# Patient Record
Sex: Female | Born: 1945 | Race: White | Hispanic: No | Marital: Married | Smoking: Never smoker
Health system: Southern US, Community
[De-identification: ages and names within clinical notes are randomized; demographics above are authoritative.]

## PROBLEM LIST (undated history)

## (undated) DIAGNOSIS — J029 Acute pharyngitis, unspecified: Secondary | ICD-10-CM

## (undated) DIAGNOSIS — I10 Essential (primary) hypertension: Secondary | ICD-10-CM

## (undated) DIAGNOSIS — M949 Disorder of cartilage, unspecified: Secondary | ICD-10-CM

## (undated) DIAGNOSIS — I059 Rheumatic mitral valve disease, unspecified: Secondary | ICD-10-CM

## (undated) DIAGNOSIS — E663 Overweight: Secondary | ICD-10-CM

## (undated) DIAGNOSIS — I253 Aneurysm of heart: Secondary | ICD-10-CM

## (undated) DIAGNOSIS — D649 Anemia, unspecified: Secondary | ICD-10-CM

## (undated) DIAGNOSIS — M899 Disorder of bone, unspecified: Secondary | ICD-10-CM

## (undated) DIAGNOSIS — Z5189 Encounter for other specified aftercare: Secondary | ICD-10-CM

## (undated) DIAGNOSIS — E119 Type 2 diabetes mellitus without complications: Secondary | ICD-10-CM

## (undated) DIAGNOSIS — R87619 Unspecified abnormal cytological findings in specimens from cervix uteri: Secondary | ICD-10-CM

## (undated) DIAGNOSIS — N189 Chronic kidney disease, unspecified: Secondary | ICD-10-CM

## (undated) DIAGNOSIS — B009 Herpesviral infection, unspecified: Secondary | ICD-10-CM

## (undated) DIAGNOSIS — E039 Hypothyroidism, unspecified: Secondary | ICD-10-CM

## (undated) DIAGNOSIS — T7840XA Allergy, unspecified, initial encounter: Secondary | ICD-10-CM

## (undated) HISTORY — DX: Disorder of cartilage, unspecified: M94.9

## (undated) HISTORY — PX: CHOLECYSTECTOMY: SHX55

## (undated) HISTORY — PX: WISDOM TOOTH EXTRACTION: SHX21

## (undated) HISTORY — DX: Aneurysm of heart: I25.3

## (undated) HISTORY — DX: Overweight: E66.3

## (undated) HISTORY — PX: OTHER SURGICAL HISTORY: SHX169

## (undated) HISTORY — DX: Chronic kidney disease, unspecified: N18.9

## (undated) HISTORY — DX: Unspecified abnormal cytological findings in specimens from cervix uteri: R87.619

## (undated) HISTORY — DX: Encounter for other specified aftercare: Z51.89

## (undated) HISTORY — DX: Type 2 diabetes mellitus without complications: E11.9

## (undated) HISTORY — DX: Anemia, unspecified: D64.9

## (undated) HISTORY — DX: Hypothyroidism, unspecified: E03.9

## (undated) HISTORY — DX: Herpesviral infection, unspecified: B00.9

## (undated) HISTORY — DX: Allergy, unspecified, initial encounter: T78.40XA

## (undated) HISTORY — DX: Disorder of bone, unspecified: M89.9

## (undated) HISTORY — DX: Acute pharyngitis, unspecified: J02.9

## (undated) HISTORY — DX: Rheumatic mitral valve disease, unspecified: I05.9

## (undated) HISTORY — DX: Essential (primary) hypertension: I10

## (undated) HISTORY — PX: ABDOMINAL HYSTERECTOMY: SHX81

---

## 2000-10-21 ENCOUNTER — Other Ambulatory Visit: Admission: RE | Admit: 2000-10-21 | Discharge: 2000-10-21 | Payer: Self-pay | Admitting: *Deleted

## 2001-10-27 ENCOUNTER — Encounter: Admission: RE | Admit: 2001-10-27 | Discharge: 2001-10-27 | Payer: Self-pay | Admitting: Internal Medicine

## 2001-10-27 ENCOUNTER — Encounter: Payer: Self-pay | Admitting: Internal Medicine

## 2004-08-21 ENCOUNTER — Encounter: Admission: RE | Admit: 2004-08-21 | Discharge: 2004-08-21 | Payer: Self-pay | Admitting: Internal Medicine

## 2005-02-19 ENCOUNTER — Ambulatory Visit: Payer: Self-pay | Admitting: Family Medicine

## 2005-08-27 ENCOUNTER — Ambulatory Visit: Payer: Self-pay | Admitting: Family Medicine

## 2005-09-16 ENCOUNTER — Ambulatory Visit: Payer: Self-pay

## 2005-09-23 ENCOUNTER — Ambulatory Visit: Payer: Self-pay | Admitting: Family Medicine

## 2005-10-09 ENCOUNTER — Ambulatory Visit: Payer: Self-pay | Admitting: Cardiovascular Disease

## 2005-11-24 ENCOUNTER — Ambulatory Visit: Payer: Self-pay | Admitting: Family Medicine

## 2005-12-05 ENCOUNTER — Ambulatory Visit: Payer: Self-pay | Admitting: Family Medicine

## 2005-12-25 ENCOUNTER — Ambulatory Visit: Payer: Self-pay | Admitting: Cardiovascular Disease

## 2005-12-25 ENCOUNTER — Ambulatory Visit (HOSPITAL_COMMUNITY): Admission: RE | Admit: 2005-12-25 | Discharge: 2005-12-25 | Payer: Self-pay | Admitting: Cardiovascular Disease

## 2006-02-16 ENCOUNTER — Encounter: Admission: RE | Admit: 2006-02-16 | Discharge: 2006-02-16 | Payer: Self-pay | Admitting: Family Medicine

## 2006-04-29 ENCOUNTER — Ambulatory Visit: Payer: Self-pay | Admitting: Cardiovascular Disease

## 2006-08-21 ENCOUNTER — Ambulatory Visit: Payer: Self-pay | Admitting: Cardiovascular Disease

## 2006-09-16 ENCOUNTER — Ambulatory Visit: Payer: Self-pay | Admitting: Family Medicine

## 2006-09-22 ENCOUNTER — Ambulatory Visit: Payer: Self-pay | Admitting: Family Medicine

## 2006-10-26 ENCOUNTER — Ambulatory Visit: Payer: Self-pay | Admitting: Internal Medicine

## 2006-11-09 ENCOUNTER — Ambulatory Visit: Payer: Self-pay | Admitting: Internal Medicine

## 2006-11-09 ENCOUNTER — Encounter: Payer: Self-pay | Admitting: Internal Medicine

## 2006-12-29 ENCOUNTER — Ambulatory Visit: Payer: Self-pay | Admitting: Family Medicine

## 2006-12-29 LAB — CONVERTED CEMR LAB
ALT: 36 units/L (ref 0–40)
AST: 32 units/L (ref 0–37)
Calcium: 8.9 mg/dL (ref 8.4–10.5)
Chloride: 101 meq/L (ref 96–112)
Creatinine, Ser: 0.8 mg/dL (ref 0.4–1.2)
Glomerular Filtration Rate, Af Am: 94 mL/min/{1.73_m2}
Glucose, Bld: 137 mg/dL — ABNORMAL HIGH (ref 70–99)
Potassium: 3.3 meq/L — ABNORMAL LOW (ref 3.5–5.1)
Sodium: 138 meq/L (ref 135–145)

## 2007-02-02 ENCOUNTER — Encounter: Payer: Self-pay | Admitting: Cardiology

## 2007-02-02 ENCOUNTER — Ambulatory Visit: Payer: Self-pay | Admitting: Cardiovascular Disease

## 2007-02-08 ENCOUNTER — Ambulatory Visit: Payer: Self-pay | Admitting: Family Medicine

## 2007-02-08 LAB — CONVERTED CEMR LAB
ALT: 29 units/L (ref 0–40)
Albumin: 4.1 g/dL (ref 3.5–5.2)
Alkaline Phosphatase: 40 units/L (ref 39–117)
CO2: 29 meq/L (ref 19–32)
Calcium: 9.3 mg/dL (ref 8.4–10.5)
Glucose, Bld: 129 mg/dL — ABNORMAL HIGH (ref 70–99)
Potassium: 3.8 meq/L (ref 3.5–5.1)
Total Bilirubin: 0.7 mg/dL (ref 0.3–1.2)
Total Protein: 7.1 g/dL (ref 6.0–8.3)

## 2007-02-09 ENCOUNTER — Encounter: Payer: Self-pay | Admitting: Family Medicine

## 2007-02-17 ENCOUNTER — Encounter: Admission: RE | Admit: 2007-02-17 | Discharge: 2007-02-17 | Payer: Self-pay | Admitting: Family Medicine

## 2007-02-23 ENCOUNTER — Ambulatory Visit: Payer: Self-pay | Admitting: Cardiovascular Disease

## 2007-02-23 LAB — CONVERTED CEMR LAB
CO2: 26 meq/L (ref 19–32)
Creatinine, Ser: 0.8 mg/dL (ref 0.4–1.2)
GFR calc Af Amer: 94 mL/min
Glucose, Bld: 120 mg/dL — ABNORMAL HIGH (ref 70–99)
Potassium: 3.8 meq/L (ref 3.5–5.1)
Sodium: 139 meq/L (ref 135–145)

## 2007-04-26 ENCOUNTER — Ambulatory Visit: Payer: Self-pay | Admitting: Cardiovascular Disease

## 2007-04-27 ENCOUNTER — Ambulatory Visit: Payer: Self-pay | Admitting: Cardiovascular Disease

## 2007-04-27 LAB — CONVERTED CEMR LAB
CO2: 30 meq/L (ref 19–32)
GFR calc Af Amer: 94 mL/min
GFR calc non Af Amer: 78 mL/min
Glucose, Bld: 113 mg/dL — ABNORMAL HIGH (ref 70–99)
Potassium: 3.5 meq/L (ref 3.5–5.1)
Sodium: 142 meq/L (ref 135–145)

## 2007-05-18 DIAGNOSIS — I059 Rheumatic mitral valve disease, unspecified: Secondary | ICD-10-CM | POA: Insufficient documentation

## 2007-05-18 DIAGNOSIS — M949 Disorder of cartilage, unspecified: Secondary | ICD-10-CM

## 2007-05-18 DIAGNOSIS — I10 Essential (primary) hypertension: Secondary | ICD-10-CM

## 2007-05-18 DIAGNOSIS — M899 Disorder of bone, unspecified: Secondary | ICD-10-CM | POA: Insufficient documentation

## 2007-06-03 ENCOUNTER — Ambulatory Visit: Payer: Self-pay | Admitting: Internal Medicine

## 2007-06-03 DIAGNOSIS — J029 Acute pharyngitis, unspecified: Secondary | ICD-10-CM

## 2007-06-14 ENCOUNTER — Ambulatory Visit: Payer: Self-pay | Admitting: Cardiovascular Disease

## 2007-10-20 ENCOUNTER — Ambulatory Visit: Payer: Self-pay | Admitting: Family Medicine

## 2007-10-21 ENCOUNTER — Telehealth (INDEPENDENT_AMBULATORY_CARE_PROVIDER_SITE_OTHER): Payer: Self-pay | Admitting: *Deleted

## 2007-10-28 ENCOUNTER — Telehealth (INDEPENDENT_AMBULATORY_CARE_PROVIDER_SITE_OTHER): Payer: Self-pay | Admitting: *Deleted

## 2007-12-08 ENCOUNTER — Ambulatory Visit: Payer: Self-pay | Admitting: Cardiovascular Disease

## 2008-02-07 ENCOUNTER — Ambulatory Visit: Payer: Self-pay | Admitting: Cardiovascular Disease

## 2008-02-07 ENCOUNTER — Ambulatory Visit: Payer: Self-pay

## 2008-02-07 ENCOUNTER — Encounter: Payer: Self-pay | Admitting: Cardiovascular Disease

## 2008-03-21 ENCOUNTER — Encounter: Admission: RE | Admit: 2008-03-21 | Discharge: 2008-03-21 | Payer: Self-pay | Admitting: Family Medicine

## 2008-03-24 ENCOUNTER — Encounter (INDEPENDENT_AMBULATORY_CARE_PROVIDER_SITE_OTHER): Payer: Self-pay | Admitting: *Deleted

## 2008-09-27 ENCOUNTER — Telehealth (INDEPENDENT_AMBULATORY_CARE_PROVIDER_SITE_OTHER): Payer: Self-pay | Admitting: *Deleted

## 2008-10-20 ENCOUNTER — Telehealth (INDEPENDENT_AMBULATORY_CARE_PROVIDER_SITE_OTHER): Payer: Self-pay | Admitting: *Deleted

## 2009-01-22 ENCOUNTER — Telehealth (INDEPENDENT_AMBULATORY_CARE_PROVIDER_SITE_OTHER): Payer: Self-pay | Admitting: *Deleted

## 2009-02-07 ENCOUNTER — Ambulatory Visit: Payer: Self-pay | Admitting: Cardiovascular Disease

## 2009-02-07 ENCOUNTER — Encounter: Payer: Self-pay | Admitting: Cardiovascular Disease

## 2009-02-07 DIAGNOSIS — E663 Overweight: Secondary | ICD-10-CM | POA: Insufficient documentation

## 2009-02-21 ENCOUNTER — Ambulatory Visit: Payer: Self-pay | Admitting: Cardiovascular Disease

## 2009-02-21 ENCOUNTER — Ambulatory Visit (HOSPITAL_COMMUNITY): Admission: RE | Admit: 2009-02-21 | Discharge: 2009-02-21 | Payer: Self-pay | Admitting: Cardiovascular Disease

## 2009-03-28 ENCOUNTER — Encounter: Admission: RE | Admit: 2009-03-28 | Discharge: 2009-03-28 | Payer: Self-pay | Admitting: Family Medicine

## 2009-04-03 ENCOUNTER — Telehealth (INDEPENDENT_AMBULATORY_CARE_PROVIDER_SITE_OTHER): Payer: Self-pay | Admitting: *Deleted

## 2009-04-18 ENCOUNTER — Telehealth (INDEPENDENT_AMBULATORY_CARE_PROVIDER_SITE_OTHER): Payer: Self-pay | Admitting: *Deleted

## 2009-07-30 ENCOUNTER — Telehealth (INDEPENDENT_AMBULATORY_CARE_PROVIDER_SITE_OTHER): Payer: Self-pay | Admitting: *Deleted

## 2009-10-15 ENCOUNTER — Telehealth (INDEPENDENT_AMBULATORY_CARE_PROVIDER_SITE_OTHER): Payer: Self-pay | Admitting: *Deleted

## 2010-02-07 ENCOUNTER — Telehealth: Payer: Self-pay | Admitting: Cardiovascular Disease

## 2010-04-10 ENCOUNTER — Encounter: Admission: RE | Admit: 2010-04-10 | Discharge: 2010-04-10 | Payer: Self-pay | Admitting: Family Medicine

## 2011-01-21 NOTE — Progress Notes (Signed)
Summary: refill-please resend  Medications Added FUROSEMIDE 20 MG TABS (FUROSEMIDE) Take one tablet by mouth daily.       Phone Note Refill Request Message from:  Patient on February 07, 2010 2:58 PM  Refills Requested: Medication #1:  BYSTOLIC 5 MG  TABS 1 by mouth once daily   Supply Requested: 3 months  Medication #2:  furoseminde 20mg  1 tab daily   Supply Requested: 3 months  Medication #3:  FOSAMAX PLUS D 70-5600 MG-UNIT  TABS 1 by mouth WEEKLY   Supply Requested: 3 months Biomedical scientist..... Please resend!   Method Requested: Fax to Mail Away Pharmacy Initial call taken by: Migdalia Dk,  February 07, 2010 2:58 PM    New/Updated Medications: FUROSEMIDE 20 MG TABS (FUROSEMIDE) Take one tablet by mouth daily. Prescriptions: FUROSEMIDE 20 MG TABS (FUROSEMIDE) Take one tablet by mouth daily.  #90 x 3   Entered by:   Kem Parkinson   Authorized by:   Colon Branch, MD, [facility]   Signed by:   Kem Parkinson on 02/07/2010   Method used:   Electronically to        [facility] [facility]* (mail-order)             ,          Ph: [phone]       Fax: [phone]   RxID:   [id number] BYSTOLIC 5 MG  TABS (NEBIVOLOL HCL) 1 by mouth once daily  #90 x 3   Entered by:   Kem Parkinson   Authorized by:   Colon Branch, MD, [facility]   Signed by:   Kem Parkinson on 02/07/2010   Method used:   Electronically to        [facility]* (mail-order)             ,          Ph: [phone]       Fax: [phone]   RxID:   [id number]

## 2011-03-24 ENCOUNTER — Other Ambulatory Visit: Payer: Self-pay | Admitting: Family Medicine

## 2011-03-24 DIAGNOSIS — Z1231 Encounter for screening mammogram for malignant neoplasm of breast: Secondary | ICD-10-CM

## 2011-04-03 LAB — CREATININE, SERUM: Creatinine, Ser: 0.83 mg/dL (ref 0.4–1.2)

## 2011-04-15 ENCOUNTER — Ambulatory Visit
Admission: RE | Admit: 2011-04-15 | Discharge: 2011-04-15 | Disposition: A | Discharge status: Home / Self Care | Source: Ambulatory Visit | Attending: Family Medicine | Admitting: Family Medicine

## 2011-04-15 DIAGNOSIS — Z1231 Encounter for screening mammogram for malignant neoplasm of breast: Secondary | ICD-10-CM

## 2011-05-06 NOTE — Assessment & Plan Note (Signed)
[facility]CARE                            CARDIOLOGY OFFICE NOTE   NAME:Cynthia Burns                         MRN:          [id number]  DATE:02/07/2008                            DOB:          10/15/1946    Cynthia Burns returns today for follow-up.  We have seen her first for aneurysm  found on an echo.  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   Her EF is normal.  Her aortic root size itself is normal.  She does not  have any significant aortic stenosis or insufficiency.  She has some  exertional dyspnea.  Weight is up.  She needs to be low more physically  active.  She has been compliant with her blood pressure medicines.  She  tends to have a component of white coat hypertension.   Review of systems otherwise negative.  In particular, she has not had  any PND, orthopnea, chest pain or palpitations and there has been no  syncope or new murmurs.   Her medications include Fosamax, Klor-Con, Bystolic, Lasix 20 a day.   EXAM:  Is remarkable for a weight of 176, blood pressure 140/80, pulse  70 regular, afebrile.  Affect appropriate.  Respiratory rate 14.  HEENT:  Unremarkable.  Carotids are without bruit, no lymphadenopathy, megaly JVP elevation.  LUNGS:  Clear diaphragmatic motion.  No wheezing.  S1-S2.  There is no aortic insufficiency murmur.  PMI normal.  ABDOMEN:  Benign.  Bowel sounds positive.  No tenderness, no bruit.  Distal pulse intact, no edema.  NEURO:  Nonfocal.  SKIN:  Warm and dry.  No muscular weakness.   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  No obvious      signs of disruption of the valve apparatus.  2. Hypertension.  Continue low-salt diet.  Continue diuretic and      Bystolic.  3. Previous lower extremity edema improved on Lasix continue low-salt      diet.  Elevate legs at the end of the day.  4. Next osteoporosis.  Continue Fosamax weekly as well as calcium      supplements.  5. Next central  weight gain.  Encourage increased activity, decreased      caloric intake.  Dietary consultation.   So long as the patient's echo does not show change in the sinus Valsalva  measurement she will be seen in the year.    Cynthia Burns. Eden Emms, MD, [facility]  Electronically Signed   PCN/MedQ  DD: 02/07/2008  DT: 02/08/2008  Job #: [id number]

## 2011-05-06 NOTE — Assessment & Plan Note (Signed)
[facility]                            CARDIOLOGY OFFICE NOTE   NAME:Cynthia Burns, Cynthia Burns                         MRN:          [id number]  DATE:12/08/2007                            DOB:          11-Feb-1946    Ms. 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  She has trivial mitral insufficiency with a  question of MVP.   She also has hypertension, she has been doing well.  She has been under  a bit of stress lately.  There are some family issues, she has an in-law  that has been diagnosed with Leukemia, her mother has macular  degeneration and needs to go to [facility] frequently.   Marylene is not having any persisting chest pain, PND, orthopnea or  palpitations, syncope.  There has been no fevers.   She is due to have a followup echo in February.   Her dentition is in good shape.   She has been compliant with her medications.  She takes Lasix for lower  extremity edema and this seems improved.  She is trying to follow a low-  salt diet, her review of systems otherwise negative.   CURRENT MEDICATIONS:  1. Lasix 20 a day.  2. Fosamax weekly.  3. Calcium.  4. Norvasc 5 mg a day.  5. Klor-Con 20 mEq b.i.d.  6. Bystolic 5 mg a day.  7. Fexofenadine 180 a day.   EXAMINATION:  Remarkable for a healthy-appearing, middle-aged white  female in no distress.  Weight is 176 which is up about 7 pounds, blood  pressure is 140/80, pulse is 80 and regular, afebrile, respiratory rate  14.  HEENT:  Unremarkable.  Carotids normal without bruit, no lymphadenopathy  or thyromegaly or JVP elevation.  LUNGS:  Clear with good diaphragmatic motion, no wheezing.  S1 and S2, can not hear any aortic insufficiency, PMI normal.  ABDOMEN:  Benign, no AAA, no bruit, no hepatosplenomegaly or  hepatojugular reflux, no tenderness.  Distal pulses are intact with trace edema.  NEURO:  Nonfocal, no muscular weakness.  SKIN:  Warm and  dry,   Baseline EKG shows sinus rhythm with poor R wave progression.   IMPRESSION:  1. 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  The sinus of Valsalva aneurysm measured 2.5      cm vertically along the axis of the aorta and 1.2 mm transversely.      There was no evidence of perforation.  Followup echo in February.  2. Trivial mitral insufficiency, not causing a problem.  Reviewed her      echo and she really does not have prolapse despite previous      clinical diagnosis of such.  No need for subacute bacterial      endocarditis prophylaxis.  3. Lower extremity edema, improved.  Continue Lasix and potassium      supplementation, low salt diet.  4. Hypertension.  Bystolic, started by Dr. Laury Axon.  Blood pressure and  heart rate seem reasonable.  MULTIPLE PREVIOUS ALLERGIES TO      RAMIPRIL AND LISINOPRIL.  Continue to monitor blood pressure at      home 2 times a week.  5. Patient inquired about her cholesterol, I do not have any records      of this.  She is due to have a physical with Dr. Laury Axon, she will      check it.  Given her relatively young age and lack of coronary      disease I would be low to treat her with a statin, particularly      given her drug intolerances unless her LDL was above 140.     [name]. Eden Emms, MD, [facility]  Electronically Signed    PCN/MedQ  DD: 12/08/2007  DT: 12/08/2007  Job #: [id number]   cc:   Lelon Perla, DO

## 2011-05-06 NOTE — Assessment & Plan Note (Signed)
[facility]                            CARDIOLOGY OFFICE NOTE   NAME:Trettin, BAYA LENTZ                         MRN:          [id number]  DATE:06/14/2007                            DOB:          03-Jan-1946    Forest returns today for followup.  She has a history of hypertension,  lower extremity edema, and a sinus of Valsalva aneurysm.   Her ascending aortic root itself is normal.  She does not have a lot of  aortic insufficiency.  The sinus of Valsalva aneurysm measures to 4 x  1.2 cm.  It is in a noncoronary cusp.  We will follow this up with an  echocardiogram in 6 months to a year.   Talking to Edita, her biggest problem has been some lower extremity  edema.  She is watching her salt intake.  She is taking her  hydrochlorothiazide.   She has no history of DVT or venous insufficiency.   Her blood pressure seems to be better controlled on Bystolic.  She was  intolerant to Norvasc and I believe lisinopril.   REVIEW OF SYSTEMS:  Otherwise negative.   MEDICATIONS CURRENTLY:  1. Fexofenadine 180 a day.  2. Bystolic 5 mg a day.  3. Klor-Con 20 t.i.d.  4. Norvasc 5 mg a day.  5. Calcium.  6. Fosamax.  7. Hydrochlorothiazide 50 mg p.r.n.   PHYSICAL EXAMINATION:  VITAL SIGNS:  Blood pressure 130/70, pulse 71 and  regular, weight is 174 and stable, respiratory rate is 16, she is  afebrile.  HEENT:  Normal.  NECK:  Carotids normal, without bruit.  There is no JVP elevation or  lymphadenopathy or thyromegaly.  LUNGS:  Clear, with normal diaphragmatic motion.  There is an S1, S2,  without an AI murmur.  PMI is normal.  ABDOMEN:  Benign.  Bowel sounds positive.  No tenderness,  hepatosplenomegaly, or hepatojugular reflux.  No AAA.  EXTREMITIES:  Femorals are +4 bilaterally, without bruit.  PTs are 2+  bilaterally.  She really only has trace edema.  NEUROLOGIC:  Nonfocal.  MUSCULOSKELETAL:  There is no muscular weakness.   Her electrocardiogram has been  normal.   IMPRESSION:  1. Stable sinus of Valsalva aneurysm.  Follow-up echocardiogram in 6      months, and no indication for surgery.  No significant aortic      insufficiency by echocardiogram.  EF 55%-65%.  2. Hypertension.  Continue Bystolic as well as low-dose diuretic.  I      did give her a prescription for Lasix in case she needs diuretics.      She will continue to avoid salt.  3. Lower extremity edema, stable, mostly dependent.  No evidence of      venous disease.   Overall, I think Ayushi is doing fine, and I will see her back in 6  months.     [name]. Eden Emms, MD, [facility]  Electronically Signed    PCN/MedQ  DD: 06/14/2007  DT: 06/15/2007  Job #: [id number]

## 2011-05-09 NOTE — Assessment & Plan Note (Signed)
[facility]                            CARDIOLOGY OFFICE NOTE   NAME:Burns, Cynthia SUTLIFF                         MRN:          [id number]  DATE:02/02/2007                            DOB:          Dec 12, 1946    [name] returns today for followup.  She has a sinus of Valsalva aneurysm.  Her 2D echocardiogram is unchanged today.   She has trivial aortic insufficiency.  She is not having chest pain,  palpitations, or shortness of breath.  Her blood pressure appears to be  running high.  She takes hydrochlorothiazide this.  I told her we would  switch her to Lisinopril/hydrochlorothiazide for better control.  Although I do not know of any firm studies, I suspect that this will be  good in terms of keeping her sinus of Valsalva aneurysm from growing or  rupturing.  She understands that the warning signs of change would be  shortness of breath and chest pain.   Currently, she is on:  1. Hydrochlorothiazide 25 a day.  2. Fosamax.  3. Calcium.  4. Advicor.  The Advicor was started about 3 months ago.  She      initially had some itching and skin reactions to it, most likely      from the niacin component, but seems to be better now.   EXAMINATION:  The weight is up to 172.  Blood pressure is 136/80.  Pulse  78 and regular.  HEENT:  Normal.  Carotids are normal.  There is an S1 and S2.  I cannot hear any murmurs.  Lungs: clear  ABDOMEN:  Benign.  LOWER EXTREMITIES:  Intact pulses.  No edema.  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  Continue to  monitor by echocardiogram.  She will have followup TE or CT scan every 2  to 3 years to get a better measurement of the sinus of Valsalva  aneurysm.  We will monitor her closely for any new murmurs.  She will  change over to Lisinopril/hydrochlorothiazide for better blood pressure  control and afterload reduction.  She will follow up with her primary  care MD to follow up her lipid  and liver profile in 6 months on Advicor.  I will see her in 3 months to recheck her blood pressures.     [name]. Eden Emms, MD, [facility]  Electronically Signed    PCN/MedQ  DD: 02/02/2007  DT: 02/02/2007  Job #: [id number]

## 2011-05-09 NOTE — Assessment & Plan Note (Signed)
[facility]                              CARDIOLOGY OFFICE NOTE   NAME:HEINZArdell, Aaronson                         MRN:          [id number]  DATE:08/21/2006                            DOB:          Dec 22, 1946    Cynthia Burns is seen today in followup.  She has had some chest pain since I  last saw her.  These occurred at the time that her husband was diagnosed  with coronary disease and required three stents in [place].  I believe it was  stress-related.  She does have a sinus of Valsalva aneurysm with some aortic  insufficiency.  This has been stable.  She has not had any PND, orthopnea,  or congestive heart failure.   At the time of her cardiac CT in January she had no coronary disease.   ON EXAM:  GENERAL:  She looks well.  VITAL SIGNS:  Blood pressure 140/80.  Pulse 70 and regular.  LUNGS:  Clear.  CAROTIDS:  Normal.  I cannot hear an aortic insufficiency murmur.  EXTREMITIES:  Distal pulses are intact.  No edema.   I told the patient that she may want to talk to Dr. Laury Axon about switching  her blood pressure medicine around.  She takes hydrochlorothiazide but does  not have a lot of lower extremity edema, the hydrochlorothiazide bottoms out  her potassium and makes her feel week, so she takes potassium  supplementation.  It may be worthwhile to stop both medicines and place her  on some low-dose lisinopril.   IMPRESSION:  1. Stable sinus of Valsalva aneurysm. No evidence of enlargement.  No      significant aortic insufficiency.  2. Chest pain, noncardiac in etiology, with normal coronaries by cardiac      CTA.  3. Hypertension.  Consider switching to lisinopril.  4. I will see her back in January when she has her repeat echo to assess      her sinus of Valsalva aneurysm.                               [name]. Eden Emms, MD, [facility]    PCN/MedQ  DD:  08/21/2006  DT:  08/22/2006  Job #:  [id number]

## 2011-05-09 NOTE — Assessment & Plan Note (Signed)
[facility] HEALTHCARE                            CARDIOLOGY OFFICE NOTE   NAME:Cynthia Burns, Cynthia Burns                         MRN:          [id number]  DATE:04/26/2007                            DOB:          1946/11/19    Cynthia Burns returns today for followup. She has a sinus of Valsalva aneurysm.   The patient has had quite some time of it lately. Her aneurysm involves  the noncoronary cusp and it measured 2.7 cm vertically along the axis of  the aorta and 1.2 cm transversely.   She has not had any chest pain, shortness of breath or syncope.   The patient's underlying aortic valve is trileaflet. Unfortunately she  has had quite some time of it lately. We have been trying to treat her  blood pressure a little more aggressively given her sinus of Valsalva  aneurysm. She initially was tried on lisinopril which caused a rash. She  subsequently had worsening rash with Ramipril. We switched her to  Norvasc and she had increased lower extremity edema.   She has stopped all of these medications for the time being and is only  taking hydrochlorothiazide. On the hydrochlorothiazide, she tends to get  potassium depleted and requires 20 of Klor-Con t.i.d. to keep a  potassium of around 3.8.   REVIEW OF SYSTEMS:  Also remarkable for recent eye problems. She  apparently had a foreign body on her cornea. She has seen Dr.  Dagoberto Ligas and apparently had allergies to eyedrops, this is slowly  improving.   CURRENT MEDICATIONS:  1. Hydrochlorothiazide 25 a day.  2. Klor-Con 20 t.i.d.  3. She had been taking a 1/2 a Norvasc a day which was 5 mg.  4. Fosamax.  5. Calcium.  6. Three eyedrops.   PHYSICAL EXAMINATION:  Middle aged female in no distress  Vitals: Blood pressure 134/96, pulse 75 and regular. RR 12 Afebrile  HEENT:  Normal.  NECK:  There is no thyromegaly, no lymphadenopathy, no JVP elevation. No  carotid bruits  LUNGS:  Clear. There is no accessory muscle use.  HEART:   There is an S1, S2 without murmur, rub, gallop or click. There  is no aortic insufficiency. PMI is normal.  ABDOMEN:  Benign. There is no mass, no AAA, no hepatosplenomegaly, no  hepatojugular reflux. Bowel sounds are positive. No tenderness  EXTREMITIES:  Distal pulses are intact. There is trace edema  bilaterally. There is a nonfocal neurological exam. There is no muscle  weakness and no adenopathy.   IMPRESSION:  Sinus of Valsalva aneurysm to be followed by echo and/or CT  or MR currently stable without significant aortic insufficiency or  stenosis.   The patient does not need SBE prophylaxis.   She will continue her diuretic. I still think she should have better  blood pressure control. She seems to be very sensitive to medications.  Since we are dealing with an aneurysm of sorts here, we will try beta  blocker therapy in addition to her hydrochlorothiazide. I gave her  samples of Bystolic 5 mg to take in addition to her diuretic.  I will see  her back in 6-8 weeks to reassess her blood pressure. There is no  previous history of beta blocker intolerances and she is not asthmatic.   Clearly it does not appear that she can tolerate ACE inhibitors and the  lower extremity edema with Norvasc may be specific to that drug but I am  hesitant to retry calcium blocker at this time. She will continue to  followup with Dr. Dagoberto Ligas in regards to her eye problems.   She will likely need followup imaging of her sinus of Valsalva in a  year.     [name]. Eden Emms, MD, [facility]  Electronically Signed    PCN/MedQ  DD: 04/26/2007  DT: 04/27/2007  Job #: [id number]

## 2011-10-08 ENCOUNTER — Other Ambulatory Visit: Payer: Self-pay | Admitting: Cardiovascular Disease

## 2011-12-25 ENCOUNTER — Encounter: Payer: Self-pay | Admitting: Internal Medicine

## 2012-03-11 ENCOUNTER — Other Ambulatory Visit: Payer: Self-pay | Admitting: Cardiovascular Disease

## 2012-03-23 ENCOUNTER — Other Ambulatory Visit: Payer: Self-pay | Admitting: Family Medicine

## 2012-03-23 DIAGNOSIS — Z1231 Encounter for screening mammogram for malignant neoplasm of breast: Secondary | ICD-10-CM

## 2012-04-28 ENCOUNTER — Ambulatory Visit

## 2012-06-24 ENCOUNTER — Other Ambulatory Visit: Payer: Self-pay | Admitting: Cardiology

## 2012-06-25 NOTE — Telephone Encounter (Signed)
Pt needs appointment then refill can be made Fax Received. Refill Completed. Haitham Dolinsky Chowoe (R.M.A)   

## 2012-07-06 ENCOUNTER — Ambulatory Visit
Admission: RE | Admit: 2012-07-06 | Discharge: 2012-07-06 | Disposition: A | Discharge status: Home / Self Care | Payer: 59 | Source: Ambulatory Visit | Attending: Family Medicine | Admitting: Family Medicine

## 2012-07-06 DIAGNOSIS — Z1231 Encounter for screening mammogram for malignant neoplasm of breast: Secondary | ICD-10-CM

## 2012-09-08 ENCOUNTER — Other Ambulatory Visit: Payer: Self-pay | Admitting: Cardiovascular Disease

## 2012-09-08 MED ORDER — NEBIVOLOL HCL 5 MG PO TABS: 5.0000 mg | ORAL_TABLET | Freq: Every day | ORAL | Status: DC

## 2012-09-08 MED ORDER — FUROSEMIDE 20 MG PO TABS: 20.0000 mg | ORAL_TABLET | Freq: Every day | ORAL | Status: DC

## 2012-10-05 ENCOUNTER — Telehealth: Payer: Self-pay | Admitting: Cardiovascular Disease

## 2012-10-05 NOTE — Telephone Encounter (Signed)
SPOKE WITH PT  PT OVERDUE FOR APPT  HAS APPT SCHEDULED IN NOV WITH DR Eden Emms  WILL COME IN FASTING FOR LABS TO BE DONE INFORMED PT  IF OTHER TESTING NEEDED WILL SCHEDULE ANOTHER DAY  PT AGREES./CY

## 2012-10-05 NOTE — Telephone Encounter (Signed)
Pt made appt to continue to get refills, wants to know if she needs lab work?

## 2012-10-29 ENCOUNTER — Encounter: Payer: Self-pay | Admitting: *Deleted

## 2012-10-29 ENCOUNTER — Encounter: Payer: Self-pay | Admitting: Cardiovascular Disease

## 2012-11-08 ENCOUNTER — Ambulatory Visit: Payer: 59 | Admitting: Cardiovascular Disease

## 2012-11-08 ENCOUNTER — Encounter: Payer: Self-pay | Admitting: Cardiovascular Disease

## 2012-11-08 ENCOUNTER — Ambulatory Visit (INDEPENDENT_AMBULATORY_CARE_PROVIDER_SITE_OTHER): Payer: 59 | Admitting: Cardiovascular Disease

## 2012-11-08 VITALS — BP 160/89 | HR 90 | Ht 63.0 in | Wt 162.0 lb

## 2012-11-08 DIAGNOSIS — Q2549 Other congenital malformations of aorta: Secondary | ICD-10-CM | POA: Insufficient documentation

## 2012-11-08 DIAGNOSIS — Z0000 Encounter for general adult medical examination without abnormal findings: Secondary | ICD-10-CM

## 2012-11-08 DIAGNOSIS — I10 Essential (primary) hypertension: Secondary | ICD-10-CM

## 2012-11-08 DIAGNOSIS — I341 Nonrheumatic mitral (valve) prolapse: Secondary | ICD-10-CM

## 2012-11-08 DIAGNOSIS — Q254 Other congenital malformations of aorta: Secondary | ICD-10-CM

## 2012-11-08 DIAGNOSIS — I059 Rheumatic mitral valve disease, unspecified: Secondary | ICD-10-CM

## 2012-11-08 LAB — LIPID PANEL
HDL: 40.8 mg/dL (ref >39.00)
Total CHOL/HDL Ratio: 7
Triglycerides: 495 mg/dL — ABNORMAL HIGH (ref 0.0–149.0)
VLDL: 99 mg/dL — ABNORMAL HIGH (ref 0.0–40.0)

## 2012-11-08 LAB — LDL CHOLESTEROL, DIRECT: Direct LDL: 170.1 mg/dL

## 2012-11-08 LAB — HEPATIC FUNCTION PANEL
ALT: 39 U/L — ABNORMAL HIGH (ref 0–35)
Total Bilirubin: 0.8 mg/dL (ref 0.3–1.2)

## 2012-11-08 MED ORDER — FUROSEMIDE 20 MG PO TABS: 20.0000 mg | ORAL_TABLET | Freq: Every day | ORAL | Status: DC

## 2012-11-08 MED ORDER — NEBIVOLOL HCL 5 MG PO TABS: 5.0000 mg | ORAL_TABLET | Freq: Every day | ORAL | Status: DC

## 2012-11-08 NOTE — Progress Notes (Signed)
[name], female   DOB: [birth year], 66 y.o.   MRN: [id number] Cynthia Burns returns today for followup. 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 Unfortunately she continues to be significant he overweight. We talked about this at length. He needs to assume a low carbohydrate diet. Lot of her social activities seem to revolve around food. She does have a treadmill at home but is sedentary. I talked to her about an exercise prescription including a heart rate of 130 beats per minute as the target. She is on diastolic but I think 130 beats per minute his achievable given her current deconditioned state. 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 The non-coronary sinus of Valsalva measure 2.5 cm vertically along the axis of the aorta or an approximately 1.2 cm transvers She has no significant aortic stenosis or insufficiency and her valve is trileaflet. Last echo in January of 09 showed no significant progression.   Last imaging study 3/10  MRI  Sinus Valsalva Lateral: 3.8cm  Sinotubular Junction: 2.5cm  Ascending Aortic Root 2.8 x 2.9 cm  Descending Thoracic Aorta: 2.3 x 2.2 cm  Has some family strife 26 yo mother living with her now and family seems upset by this   ROS: Denies fever, malais, weight loss, blurry vision, decreased visual acuity, cough, sputum, SOB, hemoptysis, pleuritic pain, palpitaitons, heartburn, abdominal pain, melena, lower extremity edema, claudication, or rash.  All other systems reviewed and negative  General: Affect appropriate Healthy:  appears stated age HEENT: normal Neck supple with no adenopathy JVP normal no bruits no thyromegaly Lungs clear with no wheezing and good diaphragmatic motion Heart:  S1/S2 no murmur, no rub, gallop or click PMI normal Abdomen: benighn, BS positve, no tenderness, no AAA no bruit.  No HSM or HJR Distal pulses intact with no bruits No  edema Neuro non-focal Skin warm and dry No muscular weakness   Current Outpatient Prescriptions  Medication Sig Dispense Refill  . furosemide (LASIX) 20 MG tablet Take 1 tablet (20 mg total) by mouth daily.  90 tablet  1  . nebivolol (BYSTOLIC) 5 MG tablet Take 1 tablet (5 mg total) by mouth daily.  90 tablet  1    Allergies  Codeine; Ramipril; and Sulfonamide derivatives  Electrocardiogram:  NSR rate 81 poor R wave progression   Assessment and Plan

## 2012-11-08 NOTE — Patient Instructions (Addendum)
Your physician wants you to follow-up in: YEAR WITH DR Haywood Filler will receive a reminder letter in the mail two months in advance. If you don't receive a letter, please call our office to schedule the follow-up appointment. Your physician recommends that you continue on your current medications as directed. Please refer to the Current Medication list given to you today. Your physician has requested that you have an echocardiogram. Echocardiography is a painless test that uses sound waves to create images of your heart. It provides your doctor with information about the size and shape of your heart and how well your heart's chambers and valves are working. This procedure takes approximately one hour. There are no restrictions for this procedure. DX MVP Your physician recommends that you return for lab work in: TODAY  LIPID LIVER  DX 272.4

## 2012-11-08 NOTE — Addendum Note (Signed)
Addended by: Lacie Scotts on: 11/08/2012 10:01 AM   Modules accepted: Orders

## 2012-11-08 NOTE — Assessment & Plan Note (Signed)
Mild dilatation/asymetry of noncoronary sinus.  No murmur  F/U echo

## 2012-11-08 NOTE — Assessment & Plan Note (Signed)
Well controlled.  Continue current medications and low sodium Dash type diet.    

## 2012-11-08 NOTE — Addendum Note (Signed)
Addended by: Scherrie Bateman E on: 11/08/2012 09:52 AM   Modules accepted: Orders

## 2012-11-17 ENCOUNTER — Ambulatory Visit (HOSPITAL_COMMUNITY): Payer: 59 | Attending: Cardiovascular Disease | Admitting: Radiology

## 2012-11-17 DIAGNOSIS — I10 Essential (primary) hypertension: Secondary | ICD-10-CM | POA: Insufficient documentation

## 2012-11-17 DIAGNOSIS — I059 Rheumatic mitral valve disease, unspecified: Secondary | ICD-10-CM

## 2012-11-17 DIAGNOSIS — I341 Nonrheumatic mitral (valve) prolapse: Secondary | ICD-10-CM

## 2012-11-17 NOTE — Progress Notes (Signed)
Echocardiogram performed.  

## 2012-11-23 ENCOUNTER — Other Ambulatory Visit: Payer: Self-pay | Admitting: *Deleted

## 2012-11-23 MED ORDER — ROSUVASTATIN CALCIUM 20 MG PO TABS: 20.0000 mg | ORAL_TABLET | Freq: Every day | ORAL | Status: DC

## 2012-11-23 MED ORDER — ASPIRIN EC 81 MG PO TBEC: 81.0000 mg | DELAYED_RELEASE_TABLET | Freq: Every day | ORAL | Status: DC

## 2013-07-12 ENCOUNTER — Other Ambulatory Visit: Payer: Self-pay

## 2013-07-12 ENCOUNTER — Other Ambulatory Visit (INDEPENDENT_AMBULATORY_CARE_PROVIDER_SITE_OTHER): Payer: Medicare Other

## 2013-07-12 ENCOUNTER — Other Ambulatory Visit: Payer: Self-pay | Admitting: *Deleted

## 2013-07-12 DIAGNOSIS — E785 Hyperlipidemia, unspecified: Secondary | ICD-10-CM

## 2013-07-12 LAB — LIPID PANEL
Cholesterol: 150 mg/dL (ref 0–200)
Triglycerides: 210 mg/dL — ABNORMAL HIGH (ref 0.0–149.0)
VLDL: 42 mg/dL — ABNORMAL HIGH (ref 0.0–40.0)

## 2013-07-12 LAB — HEPATIC FUNCTION PANEL
Albumin: 4.2 g/dL (ref 3.5–5.2)
Alkaline Phosphatase: 57 U/L (ref 39–117)
Total Protein: 7.2 g/dL (ref 6.0–8.3)

## 2013-07-20 ENCOUNTER — Telehealth: Payer: Self-pay | Admitting: Cardiovascular Disease

## 2013-07-20 NOTE — Telephone Encounter (Signed)
Follow up ° °Pt states she is returning your call.  °

## 2013-07-20 NOTE — Telephone Encounter (Signed)
PT AWARE OF LAB RESULTS./CY 

## 2013-08-24 ENCOUNTER — Encounter: Payer: Self-pay | Admitting: Internal Medicine

## 2013-08-26 ENCOUNTER — Encounter: Payer: Self-pay | Admitting: Internal Medicine

## 2013-08-31 DIAGNOSIS — S52539A Colles' fracture of unspecified radius, initial encounter for closed fracture: Secondary | ICD-10-CM | POA: Diagnosis not present

## 2013-08-31 DIAGNOSIS — S93419A Sprain of calcaneofibular ligament of unspecified ankle, initial encounter: Secondary | ICD-10-CM | POA: Diagnosis not present

## 2013-09-05 ENCOUNTER — Encounter: Payer: Self-pay | Admitting: Family Medicine

## 2013-09-05 ENCOUNTER — Ambulatory Visit (INDEPENDENT_AMBULATORY_CARE_PROVIDER_SITE_OTHER): Payer: Medicare Other | Admitting: Family Medicine

## 2013-09-05 VITALS — BP 140/84 | HR 69 | Temp 98.0°F | Wt 144.0 lb

## 2013-09-05 DIAGNOSIS — E785 Hyperlipidemia, unspecified: Secondary | ICD-10-CM | POA: Diagnosis not present

## 2013-09-05 DIAGNOSIS — I10 Essential (primary) hypertension: Secondary | ICD-10-CM

## 2013-09-05 DIAGNOSIS — Z23 Encounter for immunization: Secondary | ICD-10-CM | POA: Diagnosis not present

## 2013-09-05 DIAGNOSIS — Z0000 Encounter for general adult medical examination without abnormal findings: Secondary | ICD-10-CM | POA: Diagnosis not present

## 2013-09-05 DIAGNOSIS — R319 Hematuria, unspecified: Secondary | ICD-10-CM | POA: Diagnosis not present

## 2013-09-05 DIAGNOSIS — Z1239 Encounter for other screening for malignant neoplasm of breast: Secondary | ICD-10-CM

## 2013-09-05 LAB — CBC WITH DIFFERENTIAL/PLATELET
Basophils Absolute: 0 10*3/uL (ref 0.0–0.1)
Eosinophils Absolute: 0.1 10*3/uL (ref 0.0–0.7)
Hemoglobin: 14.5 g/dL (ref 12.0–15.0)
Lymphocytes Relative: 32.8 % (ref 12.0–46.0)
Lymphs Abs: 2.1 10*3/uL (ref 0.7–4.0)
MCHC: 33.6 g/dL (ref 30.0–36.0)
MCV: 91 fl (ref 78.0–100.0)
Monocytes Absolute: 0.3 10*3/uL (ref 0.1–1.0)
Neutro Abs: 3.9 10*3/uL (ref 1.4–7.7)
RDW: 12.4 % (ref 11.5–14.6)

## 2013-09-05 LAB — BASIC METABOLIC PANEL
CO2: 26 mEq/L (ref 19–32)
Calcium: 9.1 mg/dL (ref 8.4–10.5)
Glucose, Bld: 321 mg/dL — ABNORMAL HIGH (ref 70–99)
Sodium: 134 mEq/L — ABNORMAL LOW (ref 135–145)

## 2013-09-05 LAB — POCT URINALYSIS DIPSTICK
Nitrite, UA: NEGATIVE
Protein, UA: NEGATIVE
Urobilinogen, UA: 0.2
pH, UA: 6

## 2013-09-05 MED ORDER — ZOSTER VACCINE LIVE 19400 UNT/0.65ML ~~LOC~~ SOLR: 0.6500 mL | Freq: Once | SUBCUTANEOUS | Status: DC

## 2013-09-05 NOTE — Patient Instructions (Addendum)
Preventive Care for Adults, Female A healthy lifestyle and preventive care can promote health and wellness. Preventive health guidelines for women include the following key practices.  A routine yearly physical is a good way to check with your caregiver about your health and preventive screening. It is a chance to share any concerns and updates on your health, and to receive a thorough exam.  Visit your dentist for a routine exam and preventive care every 6 months. Brush your teeth twice a day and floss once a day. Good oral hygiene prevents tooth decay and gum disease.  The frequency of eye exams is based on your age, health, family medical history, use of contact lenses, and other factors. Follow your caregiver's recommendations for frequency of eye exams.  Eat a healthy diet. Foods like vegetables, fruits, whole grains, low-fat dairy products, and lean protein foods contain the nutrients you need without too many calories. Decrease your intake of foods high in solid fats, added sugars, and salt. Eat the right amount of calories for you.Get information about a proper diet from your caregiver, if necessary.  Regular physical exercise is one of the most important things you can do for your health. Most adults should get at least 150 minutes of moderate-intensity exercise (any activity that increases your heart rate and causes you to sweat) each week. In addition, most adults need muscle-strengthening exercises on 2 or more days a week.  Maintain a healthy weight. The body mass index (BMI) is a screening tool to identify possible weight problems. It provides an estimate of body fat based on height and weight. Your caregiver can help determine your BMI, and can help you achieve or maintain a healthy weight.For adults 20 years and older:  A BMI below 18.5 is considered underweight.  A BMI of 18.5 to 24.9 is normal.  A BMI of 25 to 29.9 is considered overweight.  A BMI of 30 and above is  considered obese.  Maintain normal blood lipids and cholesterol levels by exercising and minimizing your intake of saturated fat. Eat a balanced diet with plenty of fruit and vegetables. Blood tests for lipids and cholesterol should begin at age 20 and be repeated every 5 years. If your lipid or cholesterol levels are high, you are over 50, or you are at high risk for heart disease, you may need your cholesterol levels checked more frequently.Ongoing high lipid and cholesterol levels should be treated with medicines if diet and exercise are not effective.  If you smoke, find out from your caregiver how to quit. If you do not use tobacco, do not start.  If you are pregnant, do not drink alcohol. If you are breastfeeding, be very cautious about drinking alcohol. If you are not pregnant and choose to drink alcohol, do not exceed 1 drink per day. One drink is considered to be 12 ounces (355 mL) of beer, 5 ounces (148 mL) of wine, or 1.5 ounces (44 mL) of liquor.  Avoid use of street drugs. Do not share needles with anyone. Ask for help if you need support or instructions about stopping the use of drugs.  High blood pressure causes heart disease and increases the risk of stroke. Your blood pressure should be checked at least every 1 to 2 years. Ongoing high blood pressure should be treated with medicines if weight loss and exercise are not effective.  If you are 55 to 66 years old, ask your caregiver if you should take aspirin to prevent strokes.  Diabetes   screening involves taking a blood sample to check your fasting blood sugar level. This should be done once every 3 years, after age 45, if you are within normal weight and without risk factors for diabetes. Testing should be considered at a younger age or be carried out more frequently if you are overweight and have at least 1 risk factor for diabetes.  Breast cancer screening is essential preventive care for women. You should practice "breast  self-awareness." This means understanding the normal appearance and feel of your breasts and may include breast self-examination. Any changes detected, no matter how small, should be reported to a caregiver. Women in their 20s and 30s should have a clinical breast exam (CBE) by a caregiver as part of a regular health exam every 1 to 3 years. After age 40, women should have a CBE every year. Starting at age 40, women should consider having a mammography (breast X-ray test) every year. Women who have a family history of breast cancer should talk to their caregiver about genetic screening. Women at a high risk of breast cancer should talk to their caregivers about having magnetic resonance imaging (MRI) and a mammography every year.  The Pap test is a screening test for cervical cancer. A Pap test can show cell changes on the cervix that might become cervical cancer if left untreated. A Pap test is a procedure in which cells are obtained and examined from the lower end of the uterus (cervix).  Women should have a Pap test starting at age 21.  Between ages 21 and 29, Pap tests should be repeated every 2 years.  Beginning at age 30, you should have a Pap test every 3 years as long as the past 3 Pap tests have been normal.  Some women have medical problems that increase the chance of getting cervical cancer. Talk to your caregiver about these problems. It is especially important to talk to your caregiver if a new problem develops soon after your last Pap test. In these cases, your caregiver may recommend more frequent screening and Pap tests.  The above recommendations are the same for women who have or have not gotten the vaccine for human papillomavirus (HPV).  If you had a hysterectomy for a problem that was not cancer or a condition that could lead to cancer, then you no longer need Pap tests. Even if you no longer need a Pap test, a regular exam is a good idea to make sure no other problems are  starting.  If you are between ages 65 and 70, and you have had normal Pap tests going back 10 years, you no longer need Pap tests. Even if you no longer need a Pap test, a regular exam is a good idea to make sure no other problems are starting.  If you have had past treatment for cervical cancer or a condition that could lead to cancer, you need Pap tests and screening for cancer for at least 20 years after your treatment.  If Pap tests have been discontinued, risk factors (such as a new sexual partner) need to be reassessed to determine if screening should be resumed.  The HPV test is an additional test that may be used for cervical cancer screening. The HPV test looks for the virus that can cause the cell changes on the cervix. The cells collected during the Pap test can be tested for HPV. The HPV test could be used to screen women aged 30 years and older, and should   be used in women of any age who have unclear Pap test results. After the age of 30, women should have HPV testing at the same frequency as a Pap test.  Colorectal cancer can be detected and often prevented. Most routine colorectal cancer screening begins at the age of 50 and continues through age 75. However, your caregiver may recommend screening at an earlier age if you have risk factors for colon cancer. On a yearly basis, your caregiver may provide home test kits to check for hidden blood in the stool. Use of a small camera at the end of a tube, to directly examine the colon (sigmoidoscopy or colonoscopy), can detect the earliest forms of colorectal cancer. Talk to your caregiver about this at age 50, when routine screening begins. Direct examination of the colon should be repeated every 5 to 10 years through age 75, unless early forms of pre-cancerous polyps or small growths are found.  Hepatitis C blood testing is recommended for all people born from 1945 through 1965 and any individual with known risks for hepatitis C.  Practice  safe sex. Use condoms and avoid high-risk sexual practices to reduce the spread of sexually transmitted infections (STIs). STIs include gonorrhea, chlamydia, syphilis, trichomonas, herpes, HPV, and human immunodeficiency virus (HIV). Herpes, HIV, and HPV are viral illnesses that have no cure. They can result in disability, cancer, and death. Sexually active women aged 25 and younger should be checked for chlamydia. Older women with new or multiple partners should also be tested for chlamydia. Testing for other STIs is recommended if you are sexually active and at increased risk.  Osteoporosis is a disease in which the bones lose minerals and strength with aging. This can result in serious bone fractures. The risk of osteoporosis can be identified using a bone density scan. Women ages 65 and over and women at risk for fractures or osteoporosis should discuss screening with their caregivers. Ask your caregiver whether you should take a calcium supplement or vitamin D to reduce the rate of osteoporosis.  Menopause can be associated with physical symptoms and risks. Hormone replacement therapy is available to decrease symptoms and risks. You should talk to your caregiver about whether hormone replacement therapy is right for you.  Use sunscreen with sun protection factor (SPF) of 30 or more. Apply sunscreen liberally and repeatedly throughout the day. You should seek shade when your shadow is shorter than you. Protect yourself by wearing long sleeves, pants, a wide-brimmed hat, and sunglasses year round, whenever you are outdoors.  Once a month, do a whole body skin exam, using a mirror to look at the skin on your back. Notify your caregiver of new moles, moles that have irregular borders, moles that are larger than a pencil eraser, or moles that have changed in shape or color.  Stay current with required immunizations.  Influenza. You need a dose every fall (or winter). The composition of the flu vaccine  changes each year, so being vaccinated once is not enough.  Pneumococcal polysaccharide. You need 1 to 2 doses if you smoke cigarettes or if you have certain chronic medical conditions. You need 1 dose at age 65 (or older) if you have never been vaccinated.  Tetanus, diphtheria, pertussis (Tdap, Td). Get 1 dose of Tdap vaccine if you are younger than age 65, are over 65 and have contact with an infant, are a healthcare worker, are pregnant, or simply want to be protected from whooping cough. After that, you need a Td   booster dose every 10 years. Consult your caregiver if you have not had at least 3 tetanus and diphtheria-containing shots sometime in your life or have a deep or dirty wound.  HPV. You need this vaccine if you are a woman age 26 or younger. The vaccine is given in 3 doses over 6 months.  Measles, mumps, rubella (MMR). You need at least 1 dose of MMR if you were born in 1957 or later. You may also need a second dose.  Meningococcal. If you are age 19 to 21 and a first-year college student living in a residence hall, or have one of several medical conditions, you need to get vaccinated against meningococcal disease. You may also need additional booster doses.  Zoster (shingles). If you are age 60 or older, you should get this vaccine.  Varicella (chickenpox). If you have never had chickenpox or you were vaccinated but received only 1 dose, talk to your caregiver to find out if you need this vaccine.  Hepatitis A. You need this vaccine if you have a specific risk factor for hepatitis A virus infection or you simply wish to be protected from this disease. The vaccine is usually given as 2 doses, 6 to 18 months apart.  Hepatitis B. You need this vaccine if you have a specific risk factor for hepatitis B virus infection or you simply wish to be protected from this disease. The vaccine is given in 3 doses, usually over 6 months. Preventive Services / Frequency Ages 19 to 39  Blood  pressure check.** / Every 1 to 2 years.  Lipid and cholesterol check.** / Every 5 years beginning at age 20.  Clinical breast exam.** / Every 3 years for women in their 20s and 30s.  Pap test.** / Every 2 years from ages 21 through 29. Every 3 years starting at age 30 through age 65 or 70 with a history of 3 consecutive normal Pap tests.  HPV screening.** / Every 3 years from ages 30 through ages 65 to 70 with a history of 3 consecutive normal Pap tests.  Hepatitis C blood test.** / For any individual with known risks for hepatitis C.  Skin self-exam. / Monthly.  Influenza immunization.** / Every year.  Pneumococcal polysaccharide immunization.** / 1 to 2 doses if you smoke cigarettes or if you have certain chronic medical conditions.  Tetanus, diphtheria, pertussis (Tdap, Td) immunization. / A one-time dose of Tdap vaccine. After that, you need a Td booster dose every 10 years.  HPV immunization. / 3 doses over 6 months, if you are 26 and younger.  Measles, mumps, rubella (MMR) immunization. / You need at least 1 dose of MMR if you were born in 1957 or later. You may also need a second dose.  Meningococcal immunization. / 1 dose if you are age 19 to 21 and a first-year college student living in a residence hall, or have one of several medical conditions, you need to get vaccinated against meningococcal disease. You may also need additional booster doses.  Varicella immunization.** / Consult your caregiver.  Hepatitis A immunization.** / Consult your caregiver. 2 doses, 6 to 18 months apart.  Hepatitis B immunization.** / Consult your caregiver. 3 doses usually over 6 months. Ages 40 to 64  Blood pressure check.** / Every 1 to 2 years.  Lipid and cholesterol check.** / Every 5 years beginning at age 20.  Clinical breast exam.** / Every year after age 40.  Mammogram.** / Every year beginning at age 40   and continuing for as long as you are in good health. Consult with your  caregiver.  Pap test.** / Every 3 years starting at age 30 through age 65 or 70 with a history of 3 consecutive normal Pap tests.  HPV screening.** / Every 3 years from ages 30 through ages 65 to 70 with a history of 3 consecutive normal Pap tests.  Fecal occult blood test (FOBT) of stool. / Every year beginning at age 50 and continuing until age 75. You may not need to do this test if you get a colonoscopy every 10 years.  Flexible sigmoidoscopy or colonoscopy.** / Every 5 years for a flexible sigmoidoscopy or every 10 years for a colonoscopy beginning at age 50 and continuing until age 75.  Hepatitis C blood test.** / For all people born from 1945 through 1965 and any individual with known risks for hepatitis C.  Skin self-exam. / Monthly.  Influenza immunization.** / Every year.  Pneumococcal polysaccharide immunization.** / 1 to 2 doses if you smoke cigarettes or if you have certain chronic medical conditions.  Tetanus, diphtheria, pertussis (Tdap, Td) immunization.** / A one-time dose of Tdap vaccine. After that, you need a Td booster dose every 10 years.  Measles, mumps, rubella (MMR) immunization. / You need at least 1 dose of MMR if you were born in 1957 or later. You may also need a second dose.  Varicella immunization.** / Consult your caregiver.  Meningococcal immunization.** / Consult your caregiver.  Hepatitis A immunization.** / Consult your caregiver. 2 doses, 6 to 18 months apart.  Hepatitis B immunization.** / Consult your caregiver. 3 doses, usually over 6 months. Ages 65 and over  Blood pressure check.** / Every 1 to 2 years.  Lipid and cholesterol check.** / Every 5 years beginning at age 20.  Clinical breast exam.** / Every year after age 40.  Mammogram.** / Every year beginning at age 40 and continuing for as long as you are in good health. Consult with your caregiver.  Pap test.** / Every 3 years starting at age 30 through age 65 or 70 with a 3  consecutive normal Pap tests. Testing can be stopped between 65 and 70 with 3 consecutive normal Pap tests and no abnormal Pap or HPV tests in the past 10 years.  HPV screening.** / Every 3 years from ages 30 through ages 65 or 70 with a history of 3 consecutive normal Pap tests. Testing can be stopped between 65 and 70 with 3 consecutive normal Pap tests and no abnormal Pap or HPV tests in the past 10 years.  Fecal occult blood test (FOBT) of stool. / Every year beginning at age 50 and continuing until age 75. You may not need to do this test if you get a colonoscopy every 10 years.  Flexible sigmoidoscopy or colonoscopy.** / Every 5 years for a flexible sigmoidoscopy or every 10 years for a colonoscopy beginning at age 50 and continuing until age 75.  Hepatitis C blood test.** / For all people born from 1945 through 1965 and any individual with known risks for hepatitis C.  Osteoporosis screening.** / A one-time screening for women ages 65 and over and women at risk for fractures or osteoporosis.  Skin self-exam. / Monthly.  Influenza immunization.** / Every year.  Pneumococcal polysaccharide immunization.** / 1 dose at age 65 (or older) if you have never been vaccinated.  Tetanus, diphtheria, pertussis (Tdap, Td) immunization. / A one-time dose of Tdap vaccine if you are over   65 and have contact with an infant, are a healthcare worker, or simply want to be protected from whooping cough. After that, you need a Td booster dose every 10 years.  Varicella immunization.** / Consult your caregiver.  Meningococcal immunization.** / Consult your caregiver.  Hepatitis A immunization.** / Consult your caregiver. 2 doses, 6 to 18 months apart.  Hepatitis B immunization.** / Check with your caregiver. 3 doses, usually over 6 months. ** Family history and personal history of risk and conditions may change your caregiver's recommendations. Document Released: 02/03/2002 Document Revised: 03/01/2012  Document Reviewed: 05/05/2011 [employer] Patient Information 2014 [employer].  

## 2013-09-05 NOTE — Assessment & Plan Note (Signed)
con't meds  Check labs 

## 2013-09-05 NOTE — Addendum Note (Signed)
Addended by: Silvio Pate D on: 09/05/2013 04:43 PM   Modules accepted: Orders

## 2013-09-05 NOTE — Progress Notes (Signed)
Subjective:    Cynthia Burns is a 67 y.o. female who presents for a welcome to Medicare exam.   Cardiac risk factors: advanced age (older than 63 for men, 80 for women), dyslipidemia and hypertension.  Activities of Daily Living  In your present state of health, do you have any difficulty performing the following activities?:  Preparing food and eating?: No Bathing yourself: No Getting dressed: No Using the toilet:No Moving around from place to place: No In the past year have you fallen or had a near fall?:Yes  Current exercise habits: not recently because of a R sprained ankle and broken L wrist   Dietary issues discussed: na   Depression Screen (Note: if answer to either of the following is "Yes", then a more complete depression screening is indicated)  Q1: Over the past two weeks, have you felt down, depressed or hopeless?no Q2: Over the past two weeks, have you felt little interest or pleasure in doing things? no   The following portions of the patient's history were reviewed and updated as appropriate:  She  has a past medical history of Overweight(278.02); HYPERTENSION; MITRAL VALVE PROLAPSE; PHARYNGITIS, ACUTE; and OSTEOPENIA. She  does not have any pertinent problems on file. She  has past surgical history that includes Abdominal hysterectomy. Her family history includes Diabetes in her father; Emphysema in her father; Heart disease in her mother; Hypertension in an other family member. She  reports that she has never smoked. She does not have any smokeless tobacco history on file. Her alcohol and drug histories are not on file. She has a current medication list which includes the following prescription(s): aspirin ec, furosemide, nebivolol, rosuvastatin, and zoster vaccine live (pf). Current Outpatient Prescriptions on File Prior to Visit  Medication Sig Dispense Refill  . aspirin EC 81 MG tablet Take 1 tablet (81 mg total) by mouth daily.  90 tablet  3  . furosemide  (LASIX) 20 MG tablet Take 1 tablet (20 mg total) by mouth daily.  90 tablet  4  . nebivolol (BYSTOLIC) 5 MG tablet Take 1 tablet (5 mg total) by mouth daily.  90 tablet  4  . rosuvastatin (CRESTOR) 20 MG tablet Take 1 tablet (20 mg total) by mouth daily.  90 tablet  3   No current facility-administered medications on file prior to visit.   She is allergic to advicor; amlodipine; codeine; lisinopril-hydrochlorothiazide; ramipril; and sulfonamide derivatives.. Review of Systems Review of Systems  Constitutional: Negative for activity change, appetite change and fatigue.  HENT: Negative for hearing loss, congestion, tinnitus and ear discharge.  dentist q76m Eyes: Negative for visual disturbance (see optho -due- pt will schedule)  Respiratory: Negative for cough, chest tightness and shortness of breath.   Cardiovascular: Negative for chest pain, palpitations and leg swelling.  Gastrointestinal: Negative for abdominal pain, diarrhea, constipation and abdominal distention.  Genitourinary: Negative for urgency, frequency, decreased urine volume and difficulty urinating.  Musculoskeletal: Negative for back pain, arthralgias and gait problem.  Skin: Negative for color change, pallor and rash.  Neurological: Negative for dizziness, light-headedness, numbness and headaches.  Hematological: Negative for adenopathy. Does not bruise/bleed easily.  Psychiatric/Behavioral: Negative for suicidal ideas, confusion, sleep disturbance, self-injury, dysphoric mood, decreased concentration and agitation.        Objective:     Vision by Snellen chart: opth Blood pressure 140/84, pulse 69, temperature 98 F (36.7 C), temperature source Oral, weight 144 lb (65.318 kg), SpO2 98.00%. Body mass index is 25.51 kg/(m^2). BP 140/84  Pulse 69  Temp(Src) 98 F (36.7 C) (Oral)  Wt 144 lb (65.318 kg)  BMI 25.51 kg/m2  SpO2 98% General appearance: alert, cooperative, appears stated age and no distress Head:  Normocephalic, without obvious abnormality, atraumatic Eyes: conjunctivae/corneas clear. PERRL, EOM's intact. Fundi benign. Ears: normal TM's and external ear canals both ears Nose: Nares normal. Septum midline. Mucosa normal. No drainage or sinus tenderness. Throat: lips, mucosa, and tongue normal; teeth and gums normal Neck: no adenopathy, no carotid bruit, no JVD, supple, symmetrical, trachea midline and thyroid not enlarged, symmetric, no tenderness/mass/nodules Back: symmetric, no curvature. ROM normal. No CVA tenderness. Lungs: clear to auscultation bilaterally Breasts: normal appearance, no masses or tenderness Heart: regular rate and rhythm, S1, S2 normal, no murmur, click, rub or gallop Abdomen: soft, non-tender; bowel sounds normal; no masses,  no organomegaly Pelvic: deferred Extremities: extremities normal, atraumatic, no cyanosis or edema Pulses: 2+ and symmetric Skin: Skin color, texture, turgor normal. No rashes or lesions Lymph nodes: Cervical, supraclavicular, and axillary nodes normal. Neurologic: Alert and oriented X 3, normal strength and tone. Normal symmetric reflexes. Normal coordination and gait Psych--no depression, no anxiety      Assessment:     cpe      Plan:     During the course of the visit the patient was educated and counseled about appropriate screening and preventive services including:   Pneumococcal vaccine   Influenza vaccine  Td vaccine  Screening electrocardiogram  Screening mammography  Screening Pap smear and pelvic exam   Bone densitometry screening  Colorectal cancer screening  Glaucoma screening  Nutrition counseling   Smoking cessation counseling  Advanced directives: has an advanced directive - a copy HAS NOT been provided.  Patient Instructions (the written plan) was given to the patient.

## 2013-09-05 NOTE — Assessment & Plan Note (Signed)
Stable , con't meds  

## 2013-09-06 ENCOUNTER — Ambulatory Visit: Payer: Medicare Other

## 2013-09-06 DIAGNOSIS — R7989 Other specified abnormal findings of blood chemistry: Secondary | ICD-10-CM

## 2013-09-06 LAB — HEMOGLOBIN A1C: Hgb A1c MFr Bld: 12.6 % — ABNORMAL HIGH (ref 4.6–6.5)

## 2013-09-07 DIAGNOSIS — S93419A Sprain of calcaneofibular ligament of unspecified ankle, initial encounter: Secondary | ICD-10-CM | POA: Diagnosis not present

## 2013-09-07 DIAGNOSIS — S52539A Colles' fracture of unspecified radius, initial encounter for closed fracture: Secondary | ICD-10-CM | POA: Diagnosis not present

## 2013-09-14 ENCOUNTER — Telehealth: Payer: Self-pay

## 2013-09-14 MED ORDER — METFORMIN HCL ER 500 MG PO TB24: 500.0000 mg | ORAL_TABLET | Freq: Every day | ORAL | Status: DC

## 2013-09-14 NOTE — Telephone Encounter (Signed)
Message copied by Arnette Norris on Wed Sep 14, 2013  3:57 PM ------      Message from: Lelon Perla      Created: Tue Sep 06, 2013 12:45 PM       Pt is a diabetic--- needs diabetic ed with laura spagnoli -- in [employer] office      Start metformin xr 500 mg 1 po qpm #30  2 refills      She also needs ov for glucometer etc      Recheck 3 months      250.01  Bmp, [id number]  Lipid, hep, microalbumin ------

## 2013-09-14 NOTE — Telephone Encounter (Signed)
Discussed with patient and she wants to come in to discuss. Rx sent sent and apt scheduled     KP

## 2013-09-16 ENCOUNTER — Encounter: Payer: Self-pay | Admitting: Family Medicine

## 2013-09-16 ENCOUNTER — Ambulatory Visit (INDEPENDENT_AMBULATORY_CARE_PROVIDER_SITE_OTHER): Payer: Medicare Other | Admitting: Family Medicine

## 2013-09-16 VITALS — BP 124/82 | HR 75 | Temp 98.2°F | Wt 144.0 lb

## 2013-09-16 DIAGNOSIS — E1165 Type 2 diabetes mellitus with hyperglycemia: Secondary | ICD-10-CM | POA: Diagnosis not present

## 2013-09-16 DIAGNOSIS — E119 Type 2 diabetes mellitus without complications: Secondary | ICD-10-CM | POA: Insufficient documentation

## 2013-09-16 NOTE — Assessment & Plan Note (Signed)
Diabetic ed with glucometer training with RN Pt to check BS bid and send in in 2 weeks Recheck labs 3 months Pt here 1 hour

## 2013-09-16 NOTE — Progress Notes (Signed)
  Subjective:    [name], female    DOB: 1946-06-06, 67 y.o.   MRN: [id number]  HPI Pt here to go over diabetes diagnosis and get glucometer training.  She has taken 2 doses of metformin with no problems.   Review of Systems As above    Objective:   Physical Exam  BP 124/82  Pulse 75  Temp(Src) 98.2 F (36.8 C) (Oral)  Wt 144 lb (65.318 kg)  BMI 25.51 kg/m2  SpO2 95% General appearance: alert, cooperative, appears stated age and no distress      Assessment & Plan:

## 2013-09-16 NOTE — Patient Instructions (Addendum)
Diabetes and Standards of Medical Care  Diabetes is complicated. You may find that your diabetes team includes a dietitian, nurse, diabetes educator, eye doctor, and more. To help everyone know what is going on and to help you get the care you deserve, the following schedule of care was developed to help keep you on track. Below are the tests, exams, vaccines, medicines, education, and plans you will need. A1c test  Performed at least 2 times a year if you are meeting treatment goals.  Performed 4 times a year if therapy has changed or if you are not meeting treatment goals. Blood pressure test  Performed at every routine medical visit. The goal is less than 120/80 mmHg. Dental exam  Follow up with the dentist regularly. Eye exam  Diagnosed with type 1 diabetes as a child: Get an exam upon reaching the age of 10 years or older and having had diabetes for 3 5 years. Yearly eye exams are recommended after that initial eye exam.  Diagnosed with type 1 diabetes as an adult: Get an exam within 5 years of diagnosis and then yearly.  Diagnosed with type 2 diabetes: Get an exam as soon as possible after the diagnosis and then yearly. Foot care exam  Visual foot exams are performed at every routine medical visit. The exams check for cuts, injuries, or other problems with the feet.  A comprehensive foot exam should be done yearly. This includes visual inspection as well as assessing foot pulses and testing for loss of sensation. Kidney function test (urine microalbumin)  Performed once a year.  Type 1 diabetes: The first test is performed 5 years after diagnosis.  Type 2 diabetes: The first test is performed at the time of diagnosis.  A serum creatinine and estimated glomerular filtration rate (eGFR) test is done once a year to tell the level of chronic kidney disease (CKD), if present. Lipid profile (Cholesterol, HDL, LDL, Triglycerides)  Performed every 5 years for most people.  The  goal for LDL is less than 100 mg/dl. If at high risk, the goal is less than 70 mg/dl.  The goal for HDL is 40 mg/dl 50 mg/dl for men and 50 mg/dl 60 mg/dl for women. An HDL cholesterol of 60 mg/dL or higher gives some protection against heart disease.  The goal for triglycerides is less than 150 mg/dl. Influenza vaccine, pneumococcal vaccine, and hepatitis B vaccine  The influenza vaccine is recommended yearly.  The pneumococcal vaccine is generally given once in a lifetime. However, there are some instances when another vaccination is recommended. Check with your caregiver.  The hepatitis B vaccine is also recommended for adults with diabetes. Diabetes self-management education  Recommended at diagnosis and ongoing as needed. Treatment plan  Reviewed at every medical visit. Document Released: 10/05/2009 Document Revised: 11/24/2012 Document Reviewed: 06/10/2011 Exit[employer] Patient Information 2014 [employer].  

## 2013-09-19 ENCOUNTER — Telehealth: Payer: Self-pay | Admitting: Family Medicine

## 2013-09-19 DIAGNOSIS — E119 Type 2 diabetes mellitus without complications: Secondary | ICD-10-CM

## 2013-09-19 MED ORDER — GLUCOSE BLOOD VI STRP: ORAL_STRIP | Status: DC

## 2013-09-19 MED ORDER — FREESTYLE LANCETS MISC: Status: DC

## 2013-09-19 NOTE — Telephone Encounter (Signed)
Patient wanted to speak to sandra about Carbs.. Thanks

## 2013-09-19 NOTE — Telephone Encounter (Signed)
Called pt to answer questions concerning carbs. Encouraged pt to eat 3 small meals and 2 snacks daily with a maximum amt of 2500 gm carb intake. Test Strips and lancets sent to [facility] with a 30 day supply and [facility] with a 90 days supply.

## 2013-09-30 ENCOUNTER — Telehealth: Payer: Self-pay | Admitting: *Deleted

## 2013-09-30 DIAGNOSIS — E119 Type 2 diabetes mellitus without complications: Secondary | ICD-10-CM

## 2013-09-30 MED ORDER — METFORMIN HCL ER 500 MG PO TB24: 1000.0000 mg | ORAL_TABLET | Freq: Every day | ORAL | Status: DC

## 2013-09-30 NOTE — Telephone Encounter (Signed)
Increase to 2 metformin xr 500 mg a day (at the same time)

## 2013-09-30 NOTE — Telephone Encounter (Signed)
Patient called the office to report blood sugar readings since starting metformin. Blood sugar ranges have been between 159 and 303 with the overall average of 193. She is currently taking metformin 500 mg daily, do you want her to continue the current dose or change it? Please advise.

## 2013-09-30 NOTE — Telephone Encounter (Signed)
Spoke with pt made aware of new recommendations for Dr. Laury Axon. Rx sent to [facility]

## 2013-10-03 ENCOUNTER — Ambulatory Visit
Admission: RE | Admit: 2013-10-03 | Discharge: 2013-10-03 | Disposition: A | Discharge status: Home / Self Care | Payer: Medicare Other | Source: Ambulatory Visit | Attending: Family Medicine | Admitting: Family Medicine

## 2013-10-03 DIAGNOSIS — Z1239 Encounter for other screening for malignant neoplasm of breast: Secondary | ICD-10-CM

## 2013-10-03 DIAGNOSIS — Z1231 Encounter for screening mammogram for malignant neoplasm of breast: Secondary | ICD-10-CM | POA: Diagnosis not present

## 2013-10-25 DIAGNOSIS — S52539A Colles' fracture of unspecified radius, initial encounter for closed fracture: Secondary | ICD-10-CM | POA: Diagnosis not present

## 2013-10-26 ENCOUNTER — Ambulatory Visit (AMBULATORY_SURGERY_CENTER): Payer: Self-pay | Admitting: *Deleted

## 2013-10-26 VITALS — Ht 63.0 in | Wt 146.0 lb

## 2013-10-26 DIAGNOSIS — Z860100 Personal history of colon polyps, unspecified: Secondary | ICD-10-CM

## 2013-10-26 MED ORDER — MOVIPREP 100 G PO SOLR: ORAL | Status: DC

## 2013-10-26 NOTE — Progress Notes (Signed)
Patient denies any allergies to eggs or soy. Patient denies any problems with anesthesia.  

## 2013-11-08 ENCOUNTER — Ambulatory Visit (AMBULATORY_SURGERY_CENTER): Payer: Medicare Other | Admitting: Internal Medicine

## 2013-11-08 ENCOUNTER — Encounter: Payer: Self-pay | Admitting: Internal Medicine

## 2013-11-08 VITALS — BP 160/86 | HR 56 | Temp 96.5°F | Resp 23 | Ht 63.0 in | Wt 146.0 lb

## 2013-11-08 DIAGNOSIS — Z1211 Encounter for screening for malignant neoplasm of colon: Secondary | ICD-10-CM | POA: Diagnosis not present

## 2013-11-08 DIAGNOSIS — I10 Essential (primary) hypertension: Secondary | ICD-10-CM | POA: Diagnosis not present

## 2013-11-08 DIAGNOSIS — Z86010 Personal history of colonic polyps: Secondary | ICD-10-CM

## 2013-11-08 DIAGNOSIS — E119 Type 2 diabetes mellitus without complications: Secondary | ICD-10-CM | POA: Diagnosis not present

## 2013-11-08 LAB — GLUCOSE, CAPILLARY: Glucose-Capillary: 116 mg/dL — ABNORMAL HIGH (ref 70–99)

## 2013-11-08 MED ORDER — SODIUM CHLORIDE 0.9 % IV SOLN: 500.0000 mL | INTRAVENOUS | Status: DC

## 2013-11-08 NOTE — Op Note (Signed)
[facility] [address] [place], [address]   COLONOSCOPY PROCEDURE REPORT  PATIENT: Emerita, Berkemeier  MR#: [id number] BIRTHDATE: 1946/05/06 , 66  yrs. old GENDER: Female ENDOSCOPIST: Hart Carwin, MD REFERRED XB:JYNWGN Lowne, DO PROCEDURE DATE:  11/08/2013 PROCEDURE:   Colonoscopy, screening First Screening Colonoscopy - Avg.  risk and is 50 yrs.  old or older - No.  Prior Negative Screening - Now for repeat screening. 10 or more years since last screening Prior Negative Screening - Now for repeat screening.  Other: See Comments  History of Adenoma - Now for follow-up colonoscopy & has been > or = to 3 yrs.  N/A Polyps Removed Today? No.  Recommend repeat exam, <10 yrs? No. ASA CLASS:   Class II INDICATIONS:last colonoscopy November 2007 showed hyperplastic polyp.  Cecal pouch was not visualized. MEDICATIONS: MAC sedation, administered by CRNA and propofol (Diprivan) 200mg  IV  DESCRIPTION OF PROCEDURE:   After the risks benefits and alternatives of the procedure were thoroughly explained, informed consent was obtained.  A digital rectal exam revealed no abnormalities of the rectum.   The LB [id number]  endoscope was introduced through the anus and advanced to the cecum, which was identified by both the appendix and ileocecal valve. No adverse events experienced.   The quality of the prep was Prepopik excellent  The instrument was then slowly withdrawn as the colon was fully examined.      COLON FINDINGS: Mild diverticulosis was noted in the sigmoid colon. Retroflexed views revealed no abnormalities. The time to cecum=7 minutes 7 seconds.  Withdrawal time=8 minutes 13 seconds.  The scope was withdrawn and the procedure completed. COMPLICATIONS: There were no complications.  ENDOSCOPIC IMPRESSION: Mild diverticulosis was noted in the sigmoid colon  RECOMMENDATIONS: 1.  High fiber diet 2.   recall colonoscopy in 10 years   eSigned:  Hart Carwin, MD 11/08/2013 11:13 AM   cc:   PATIENT NAME:  Selena, Swaminathan MR#: [id number]

## 2013-11-08 NOTE — Progress Notes (Signed)
Patient did not experience any of the following events: a burn prior to discharge; a fall within the facility; wrong site/side/patient/procedure/implant event; or a hospital transfer or hospital admission upon discharge from the facility. (G8907) Patient did not have preoperative order for IV antibiotic SSI prophylaxis. (G8918)  

## 2013-11-08 NOTE — Patient Instructions (Signed)
YOU HAD AN ENDOSCOPIC PROCEDURE TODAY AT THE [facility]: Refer to the procedure report that was given to you for any specific questions about what was found during the examination.  If the procedure report does not answer your questions, please call your gastroenterologist to clarify.  If you requested that your care partner not be given the details of your procedure findings, then the procedure report has been included in a sealed envelope for you to review at your convenience later.  YOU SHOULD EXPECT: Some feelings of bloating in the abdomen. Passage of more gas than usual.  Walking can help get rid of the air that was put into your GI tract during the procedure and reduce the bloating. If you had a lower endoscopy (such as a colonoscopy or flexible sigmoidoscopy) you may notice spotting of blood in your stool or on the toilet paper. If you underwent a bowel prep for your procedure, then you may not have a normal bowel movement for a few days.  DIET: Your first meal following the procedure should be a light meal and then it is ok to progress to your normal diet.  A half-sandwich or bowl of soup is an example of a good first meal.  Heavy or fried foods are harder to digest and may make you feel nauseous or bloated.  Likewise meals heavy in dairy and vegetables can cause extra gas to form and this can also increase the bloating.  Drink plenty of fluids but you should avoid alcoholic beverages for 24 hours.  ACTIVITY: Your care partner should take you home directly after the procedure.  You should plan to take it easy, moving slowly for the rest of the day.  You can resume normal activity the day after the procedure however you should NOT DRIVE or use heavy machinery for 24 hours (because of the sedation medicines used during the test).    SYMPTOMS TO REPORT IMMEDIATELY: A gastroenterologist can be reached at any hour.  During normal business hours, 8:30 AM to 5:00 PM Monday through Friday,  call [phone].  After hours and on weekends, please call the GI answering service at [phone] who will take a message and have the physician on call contact you.   Following lower endoscopy (colonoscopy or flexible sigmoidoscopy):  Excessive amounts of blood in the stool  Significant tenderness or worsening of abdominal pains  Swelling of the abdomen that is new, acute  Fever of 100F or higher   FOLLOW UP: If any biopsies were taken you will be contacted by phone or by letter within the next 1-3 weeks.  Call your gastroenterologist if you have not heard about the biopsies in 3 weeks.  Our staff will call the home number listed on your records the next business day following your procedure to check on you and address any questions or concerns that you may have at that time regarding the information given to you following your procedure. This is a courtesy call and so if there is no answer at the home number and we have not heard from you through the emergency physician on call, we will assume that you have returned to your regular daily activities without incident.  SIGNATURES/CONFIDENTIALITY: You and/or your care partner have signed paperwork which will be entered into your electronic medical record.  These signatures attest to the fact that that the information above on your After Visit Summary has been reviewed and is understood.  Full responsibility of the confidentiality of   this discharge information lies with you and/or your care-partner.  Diverticulosis and high fiber diet information given. 

## 2013-11-09 ENCOUNTER — Telehealth: Payer: Self-pay | Admitting: *Deleted

## 2013-11-09 NOTE — Telephone Encounter (Signed)
  Follow up Call-  Call back number 11/08/2013  Post procedure Call Back phone  # [phone]  Permission to leave phone message Yes     Patient questions:  Do you have a fever, pain , or abdominal swelling? no Pain Score  0 *  Have you tolerated food without any problems? yes  Have you been able to return to your normal activities? yes  Do you have any questions about your discharge instructions: Diet   no Medications  no Follow up visit  no  Do you have questions or concerns about your Care? no  Actions: * If pain score is 4 or above: No action needed, pain <4.

## 2013-11-16 ENCOUNTER — Encounter: Payer: Self-pay | Admitting: Cardiovascular Disease

## 2013-11-16 ENCOUNTER — Ambulatory Visit (INDEPENDENT_AMBULATORY_CARE_PROVIDER_SITE_OTHER): Payer: Medicare Other | Admitting: Cardiovascular Disease

## 2013-11-16 ENCOUNTER — Encounter: Payer: Self-pay | Admitting: *Deleted

## 2013-11-16 VITALS — BP 134/82 | HR 76 | Ht 63.0 in | Wt 140.8 lb

## 2013-11-16 DIAGNOSIS — I719 Aortic aneurysm of unspecified site, without rupture: Secondary | ICD-10-CM

## 2013-11-16 DIAGNOSIS — Q2549 Other congenital malformations of aorta: Secondary | ICD-10-CM

## 2013-11-16 DIAGNOSIS — I10 Essential (primary) hypertension: Secondary | ICD-10-CM

## 2013-11-16 DIAGNOSIS — Q254 Other congenital malformations of aorta: Secondary | ICD-10-CM | POA: Diagnosis not present

## 2013-11-16 DIAGNOSIS — E1165 Type 2 diabetes mellitus with hyperglycemia: Secondary | ICD-10-CM

## 2013-11-16 DIAGNOSIS — I2541 Coronary artery aneurysm: Secondary | ICD-10-CM

## 2013-11-16 LAB — BASIC METABOLIC PANEL
BUN: 15 mg/dL (ref 6–23)
CO2: 30 mEq/L (ref 19–32)
Chloride: 101 mEq/L (ref 96–112)
Glucose, Bld: 166 mg/dL — ABNORMAL HIGH (ref 70–99)
Potassium: 3.5 mEq/L (ref 3.5–5.1)
Sodium: 138 mEq/L (ref 135–145)

## 2013-11-16 MED ORDER — FUROSEMIDE 20 MG PO TABS: 10.0000 mg | ORAL_TABLET | Freq: Every day | ORAL | Status: DC

## 2013-11-16 MED ORDER — ROSUVASTATIN CALCIUM 20 MG PO TABS: 20.0000 mg | ORAL_TABLET | Freq: Every day | ORAL | Status: DC

## 2013-11-16 MED ORDER — LOSARTAN POTASSIUM 25 MG PO TABS: 25.0000 mg | ORAL_TABLET | Freq: Every day | ORAL | Status: DC

## 2013-11-16 MED ORDER — NEBIVOLOL HCL 5 MG PO TABS: 5.0000 mg | ORAL_TABLET | Freq: Every day | ORAL | Status: DC

## 2013-11-16 NOTE — Progress Notes (Signed)
[name], female   DOB: 1946/08/08, 67 y.o.   MRN: [id number] [name] returns today for followup. 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 Unfortunately she continues to be significant he overweight. We talked about this at length. He needs to assume a low carbohydrate diet. Lot of her social activities seem to revolve around food. She does have a treadmill at home but is sedentary. I talked to her about an exercise prescription including a heart rate of 130 beats per minute as the target. She is on diastolic but I think 130 beats per minute his achievable given her current deconditioned state. 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 The non-coronary sinus of Valsalva measure 2.5 cm vertically along the axis of the aorta or an approximately 1.2 cm transvers She has no significant aortic stenosis or insufficiency and her valve is trileaflet. Last echo in January of 09 showed no significant progression.  Last imaging study 3/10 MRI  Sinus Valsalva Lateral: 3.8cm  Sinotubular Junction: 2.5cm  Ascending Aortic Root 2.8 x 2.9 cm  Descending Thoracic Aorta: 2.3 x 2.2 cm  Has some family strife 29 yo mother living with her now and family seems upset by this      ROS: Denies fever, malais, weight loss, blurry vision, decreased visual acuity, cough, sputum, SOB, hemoptysis, pleuritic pain, palpitaitons, heartburn, abdominal pain, melena, lower extremity edema, claudication, or rash.  All other systems reviewed and negative  General: Affect appropriate Healthy:  appears stated age HEENT: normal Neck supple with no adenopathy JVP normal no bruits no thyromegaly Lungs clear with no wheezing and good diaphragmatic motion Heart:  S1/S2 no murmur, no rub, gallop or click PMI normal Abdomen: benighn, BS positve, no tenderness, no AAA no bruit.  No HSM or HJR Distal pulses intact with no bruits No  edema Neuro non-focal Skin warm and dry No muscular weakness   Current Outpatient Prescriptions  Medication Sig Dispense Refill  . aspirin EC 81 MG tablet Take 1 tablet (81 mg total) by mouth daily.  90 tablet  3  . Calcium-Magnesium-Vitamin D (CALCIUM 500 PO) Take 2 tablets by mouth daily.      . Cyanocobalamin (VITAMIN B-12 PO) Take 2 tablets by mouth once a week.      . furosemide (LASIX) 20 MG tablet Take 1 tablet (20 mg total) by mouth daily.  90 tablet  4  . glucose blood test strip Use as instructed with Freestyle Lite Meter  300 each  3  . Lancets (FREESTYLE) lancets Use as instructed  300 each  3  . meclizine (ANTIVERT) 25 MG tablet Take 25 mg by mouth as needed for dizziness.      . metFORMIN (GLUCOPHAGE XR) 500 MG 24 hr tablet Take 2 tablets (1,000 mg total) by mouth daily with breakfast.  60 tablet  6  . Multiple Vitamins-Minerals (PRESERVISION AREDS 2 PO) Take 2 capsules by mouth once a week.      . nebivolol (BYSTOLIC) 5 MG tablet Take 1 tablet (5 mg total) by mouth daily.  90 tablet  4  . rosuvastatin (CRESTOR) 20 MG tablet Take 1 tablet (20 mg total) by mouth daily.  90 tablet  3  . VITAMIN D, CHOLECALCIFEROL, PO Take 2 tablets by mouth once a week.       No current facility-administered medications for this visit.    Allergies  Codeine; Advicor; Amlodipine;  Lisinopril-hydrochlorothiazide; Other; Ramipril; and Sulfonamide derivatives  Electrocardiogram: 9/15/ SR rate 72 normal ECG  Assessment and Plan

## 2013-11-16 NOTE — Addendum Note (Signed)
Addended by: Sharin Grave on: 11/16/2013 10:02 AM   Modules accepted: Orders

## 2013-11-16 NOTE — Assessment & Plan Note (Signed)
Well controlled.  Continue current medications and low sodium Dash type diet.    

## 2013-11-16 NOTE — Assessment & Plan Note (Addendum)
Would appear to need more than metformin F/U Dr Laury Axon  Start ARB for renal protection and cut lasix back to 10 mg

## 2013-11-16 NOTE — Addendum Note (Signed)
Addended by: Sharin Grave on: 11/16/2013 09:55 AM   Modules accepted: Orders

## 2013-11-16 NOTE — Assessment & Plan Note (Signed)
Has not been imaged/measured in over 3 years.  Favor gated cardiac CT using TAVR protocol to measure sinuses in orthogonal planes accurately.

## 2013-11-16 NOTE — Patient Instructions (Addendum)
Please start Cozaar (losartan) 25 mg a day.  Decrease your Furosemide to 10 mg a day. Continue all other medications as listed.  Please have blood work today (BMP)  Your physician has requested that you have cardiac CT om a day Dr Eden Emms is in the hospital. Cardiac computed tomography (CT) is a painless test that uses an x-ray machine to take clear, detailed pictures of your heart. For further information please visit [email or web address] Please follow instruction sheet as given.  Follow up in 1 year with Dr Eden Emms.  You will receive a letter in the mail 2 months before you are due.  Please call us when you receive this letter to schedule your follow up appointment.

## 2013-11-28 ENCOUNTER — Ambulatory Visit (INDEPENDENT_AMBULATORY_CARE_PROVIDER_SITE_OTHER): Payer: Medicare Other | Admitting: Family Medicine

## 2013-11-28 ENCOUNTER — Other Ambulatory Visit: Payer: Self-pay | Admitting: Family Medicine

## 2013-11-28 ENCOUNTER — Encounter: Payer: Self-pay | Admitting: Family Medicine

## 2013-11-28 VITALS — BP 124/76 | HR 71 | Temp 98.3°F | Wt 139.5 lb

## 2013-11-28 DIAGNOSIS — E1165 Type 2 diabetes mellitus with hyperglycemia: Secondary | ICD-10-CM

## 2013-11-28 DIAGNOSIS — E785 Hyperlipidemia, unspecified: Secondary | ICD-10-CM

## 2013-11-28 DIAGNOSIS — E119 Type 2 diabetes mellitus without complications: Secondary | ICD-10-CM

## 2013-11-28 DIAGNOSIS — I10 Essential (primary) hypertension: Secondary | ICD-10-CM | POA: Diagnosis not present

## 2013-11-28 DIAGNOSIS — R319 Hematuria, unspecified: Secondary | ICD-10-CM | POA: Diagnosis not present

## 2013-11-28 LAB — HEMOGLOBIN A1C: Hgb A1c MFr Bld: 8.2 % — ABNORMAL HIGH (ref 4.6–6.5)

## 2013-11-28 LAB — LIPID PANEL
Cholesterol: 109 mg/dL (ref 0–200)
HDL: 38 mg/dL — ABNORMAL LOW (ref >39.00)
LDL Cholesterol: 45 mg/dL (ref 0–99)
Total CHOL/HDL Ratio: 3
Triglycerides: 128 mg/dL (ref 0.0–149.0)
VLDL: 25.6 mg/dL (ref 0.0–40.0)

## 2013-11-28 LAB — POCT URINALYSIS DIPSTICK
Bilirubin, UA: NEGATIVE
Ketones, UA: NEGATIVE
Nitrite, UA: NEGATIVE
Spec Grav, UA: >=1.03
pH, UA: 6

## 2013-11-28 LAB — HEPATIC FUNCTION PANEL
Bilirubin, Direct: 0 mg/dL (ref 0.0–0.3)
Total Bilirubin: 0.6 mg/dL (ref 0.3–1.2)
Total Protein: 7.4 g/dL (ref 6.0–8.3)

## 2013-11-28 LAB — BASIC METABOLIC PANEL
Calcium: 9.3 mg/dL (ref 8.4–10.5)
GFR: 83.47 mL/min (ref >60.00)
Potassium: 3.7 mEq/L (ref 3.5–5.1)
Sodium: 137 mEq/L (ref 135–145)

## 2013-11-28 NOTE — Progress Notes (Signed)
Pre visit review using our clinic review tool, if applicable. No additional management support is needed unless otherwise documented below in the visit note. 

## 2013-11-28 NOTE — Progress Notes (Signed)
   Subjective:    [name], female    DOB: 10-07-1946, 67 y.o.   MRN: [id number]  HPI  HPI HYPERTENSION  Blood pressure range-not checking  Chest pain- no      Dyspnea- no Lightheadedness- no   Edema- no Other side effects - no   Medication compliance: good Low salt diet- yes  DIABETES  Blood Sugar ranges-77-176  Polyuria- no New Visual problems- no Hypoglycemic symptoms- no Other side effects-no Medication compliance - good Last eye exam- due Foot exam- today  HYPERLIPIDEMIA  Medication compliance- good RUQ pain- no  Muscle aches- no Other side effects-no  ROS See HPI above   PMH Smoking Status noted     Review of Systems As above    Objective:   Physical Exam BP 124/76  Pulse 71  Temp(Src) 98.3 F (36.8 C) (Oral)  Wt 139 lb 8 oz (63.277 kg)  SpO2 96% General appearance: alert, cooperative, appears stated age and no distress Eyes: negative findings: lids and lashes normal and pupils equal, round, reactive to light and accomodation Neck: no adenopathy, no carotid bruit, no JVD, supple, symmetrical, trachea midline and thyroid not enlarged, symmetric, no tenderness/mass/nodules Lungs: clear to auscultation bilaterally Heart: S1, S2 normal Extremities: extremities normal, atraumatic, no cyanosis or edema Sensory exam of the foot is normal, tested with the monofilament. Good pulses, no lesions or ulcers, good peripheral pulses.        Assessment & Plan:

## 2013-11-28 NOTE — Assessment & Plan Note (Signed)
Check labs con't meds 

## 2013-11-28 NOTE — Assessment & Plan Note (Signed)
Stable con't meds 

## 2013-11-28 NOTE — Patient Instructions (Signed)

## 2013-11-28 NOTE — Addendum Note (Signed)
Addended by: Verdie Shire on: 11/28/2013 10:12 AM   Modules accepted: Orders

## 2013-11-28 NOTE — Assessment & Plan Note (Signed)
Check labs 

## 2013-12-01 ENCOUNTER — Ambulatory Visit (HOSPITAL_COMMUNITY)
Admission: RE | Admit: 2013-12-01 | Discharge: 2013-12-01 | Disposition: A | Discharge status: Home / Self Care | Payer: Medicare Other | Source: Ambulatory Visit | Attending: Cardiovascular Disease | Admitting: Cardiovascular Disease

## 2013-12-01 DIAGNOSIS — I719 Aortic aneurysm of unspecified site, without rupture: Secondary | ICD-10-CM | POA: Diagnosis not present

## 2013-12-01 DIAGNOSIS — I2541 Coronary artery aneurysm: Secondary | ICD-10-CM

## 2013-12-01 DIAGNOSIS — R079 Chest pain, unspecified: Secondary | ICD-10-CM | POA: Diagnosis not present

## 2013-12-01 DIAGNOSIS — Q2549 Other congenital malformations of aorta: Secondary | ICD-10-CM

## 2013-12-01 MED ORDER — METOPROLOL TARTRATE 1 MG/ML IV SOLN
5.0000 mg | Freq: Once | INTRAVENOUS | Status: AC
  Administered 2013-12-01: 5 mg via INTRAVENOUS
  Filled 2013-12-01: qty 5

## 2013-12-01 MED ORDER — METOPROLOL TARTRATE 1 MG/ML IV SOLN
INTRAVENOUS | Status: AC
  Filled 2013-12-01: qty 5

## 2013-12-01 MED ORDER — IOHEXOL 350 MG/ML SOLN
80.0000 mL | Freq: Once | INTRAVENOUS | Status: AC | PRN
  Administered 2013-12-01: 80 mL via INTRAVENOUS

## 2013-12-01 MED ORDER — NITROGLYCERIN 0.4 MG SL SUBL
0.4000 mg | SUBLINGUAL_TABLET | SUBLINGUAL | Status: DC | PRN
  Filled 2013-12-01: qty 25

## 2013-12-01 MED ORDER — NITROGLYCERIN 0.4 MG SL SUBL
SUBLINGUAL_TABLET | SUBLINGUAL | Status: AC
  Administered 2013-12-01: 13:00:00 via SUBLINGUAL
  Filled 2013-12-01: qty 25

## 2013-12-01 MED ORDER — METOPROLOL TARTRATE 1 MG/ML IV SOLN
INTRAVENOUS | Status: AC
  Administered 2013-12-01: 5 mg via INTRAVENOUS
  Filled 2013-12-01: qty 10

## 2013-12-01 NOTE — Progress Notes (Signed)
Tolerated CTA well. No dizziness. Takes Metformin, instructed not to take for 48 hours.

## 2013-12-12 DIAGNOSIS — E119 Type 2 diabetes mellitus without complications: Secondary | ICD-10-CM

## 2013-12-12 MED ORDER — METFORMIN HCL ER 500 MG PO TB24: ORAL_TABLET | ORAL | Status: DC

## 2014-01-16 ENCOUNTER — Other Ambulatory Visit: Payer: Self-pay

## 2014-01-16 MED ORDER — ONETOUCH DELICA LANCETS 33G MISC: Status: DC

## 2014-01-16 MED ORDER — GLUCOSE BLOOD VI STRP: ORAL_STRIP | Status: DC

## 2014-01-18 ENCOUNTER — Encounter: Payer: Medicare Other | Attending: Family Medicine

## 2014-01-18 VITALS — Ht 63.0 in | Wt 144.1 lb

## 2014-01-18 DIAGNOSIS — IMO0002 Reserved for concepts with insufficient information to code with codable children: Secondary | ICD-10-CM

## 2014-01-18 DIAGNOSIS — E118 Type 2 diabetes mellitus with unspecified complications: Secondary | ICD-10-CM

## 2014-01-18 DIAGNOSIS — Z713 Dietary counseling and surveillance: Secondary | ICD-10-CM | POA: Insufficient documentation

## 2014-01-18 DIAGNOSIS — E119 Type 2 diabetes mellitus without complications: Secondary | ICD-10-CM | POA: Insufficient documentation

## 2014-01-18 DIAGNOSIS — E1165 Type 2 diabetes mellitus with hyperglycemia: Secondary | ICD-10-CM

## 2014-01-18 NOTE — Progress Notes (Signed)
Patient was seen on 01/18/2014 for the first of a series of three diabetes self-management courses at the [facility].  Current HbA1c: 8.2% on 11/28/2014  The following learning objectives were met by the patient during this class:  Describe diabetes  State some common risk factors for diabetes  Defines the role of glucose and insulin  Identifies type of diabetes and pathophysiology  Describe the relationship between diabetes and cardiovascular risk  State the members of the Healthcare Team  States the rationale for glucose monitoring  State when to test glucose  State their individual Target Range  State the importance of logging glucose readings  Describe how to interpret glucose readings  Identifies A1C target  Explain the correlation between A1c and eAG values  State symptoms and treatment of high blood glucose  State symptoms and treatment of low blood glucose  Explain proper technique for glucose testing  Identifies proper sharps disposal  Handouts given during class include:  Living Well with Diabetes book  Carb Counting and Meal Planning book  Meal Plan Card  Carbohydrate guide  Meal planning worksheet  Low Sodium Flavoring Tips  The diabetes portion plate  O4C to eAG Conversion Chart  Diabetes Medications  Diabetes Recommended Care Schedule  Support Group  Diabetes Success Plan  Core Class Satisfaction Survey  Follow-Up Plan:  Attend core 2

## 2014-01-22 ENCOUNTER — Other Ambulatory Visit: Payer: Self-pay | Admitting: Cardiovascular Disease

## 2014-01-24 ENCOUNTER — Other Ambulatory Visit: Payer: Self-pay

## 2014-01-24 MED ORDER — GLUCOSE BLOOD VI STRP: ORAL_STRIP | Status: DC

## 2014-01-24 MED ORDER — ONETOUCH DELICA LANCETS 33G MISC: Status: DC

## 2014-01-25 ENCOUNTER — Encounter: Payer: Medicare Other | Attending: Family Medicine

## 2014-01-25 DIAGNOSIS — Z713 Dietary counseling and surveillance: Secondary | ICD-10-CM | POA: Insufficient documentation

## 2014-01-25 DIAGNOSIS — E119 Type 2 diabetes mellitus without complications: Secondary | ICD-10-CM | POA: Diagnosis not present

## 2014-01-25 DIAGNOSIS — IMO0002 Reserved for concepts with insufficient information to code with codable children: Secondary | ICD-10-CM

## 2014-01-25 DIAGNOSIS — E118 Type 2 diabetes mellitus with unspecified complications: Secondary | ICD-10-CM

## 2014-01-25 DIAGNOSIS — E1165 Type 2 diabetes mellitus with hyperglycemia: Secondary | ICD-10-CM

## 2014-01-25 NOTE — Progress Notes (Signed)

## 2014-02-01 DIAGNOSIS — E118 Type 2 diabetes mellitus with unspecified complications: Principal | ICD-10-CM

## 2014-02-01 DIAGNOSIS — IMO0002 Reserved for concepts with insufficient information to code with codable children: Secondary | ICD-10-CM

## 2014-02-01 DIAGNOSIS — E1165 Type 2 diabetes mellitus with hyperglycemia: Secondary | ICD-10-CM

## 2014-02-01 NOTE — Progress Notes (Signed)
Patient was seen on 02/01/14 for the third of a series of three diabetes self-management courses at the [facility]. The following learning objectives were met by the patient during this class:    State the amount of activity recommended for healthy living   Describe activities suitable for individual needs   Identify ways to regularly incorporate activity into daily life   Identify barriers to activity and ways to over come these barriers  Identify diabetes medications being personally used and their primary action for lowering glucose and possible side effects   Describe role of stress on blood glucose and develop strategies to address psychosocial issues   Identify diabetes complications and ways to prevent them  Explain how to manage diabetes during illness   Evaluate success in meeting personal goal   Establish 2-3 goals that they will plan to diligently work on until they return for the  54-monthfollow-up visit  Goals:  Follow Diabetes Meal Plan as instructed  Aim for 15-30 mins of physical activity daily as tolerated  Bring food record and glucose log to your follow up visit  Your patient has established the following 4 month goals in their individualized success plan: I will increase my activity level at least 3 days a week for 30 minutes or more  Your patient has identified these potential barriers to change:  none  Your patient has identified their diabetes self-care support plan as  [facility]  My husband and I exercise together

## 2014-02-27 ENCOUNTER — Encounter: Payer: Self-pay | Admitting: Family Medicine

## 2014-02-27 ENCOUNTER — Ambulatory Visit (INDEPENDENT_AMBULATORY_CARE_PROVIDER_SITE_OTHER): Payer: Medicare Other | Admitting: Family Medicine

## 2014-02-27 VITALS — BP 120/70 | HR 69 | Temp 98.0°F | Wt 140.0 lb

## 2014-02-27 DIAGNOSIS — E1165 Type 2 diabetes mellitus with hyperglycemia: Secondary | ICD-10-CM

## 2014-02-27 DIAGNOSIS — E119 Type 2 diabetes mellitus without complications: Secondary | ICD-10-CM

## 2014-02-27 DIAGNOSIS — Z23 Encounter for immunization: Secondary | ICD-10-CM | POA: Diagnosis not present

## 2014-02-27 DIAGNOSIS — IMO0002 Reserved for concepts with insufficient information to code with codable children: Secondary | ICD-10-CM

## 2014-02-27 DIAGNOSIS — E785 Hyperlipidemia, unspecified: Secondary | ICD-10-CM | POA: Diagnosis not present

## 2014-02-27 DIAGNOSIS — N390 Urinary tract infection, site not specified: Secondary | ICD-10-CM

## 2014-02-27 DIAGNOSIS — I10 Essential (primary) hypertension: Secondary | ICD-10-CM

## 2014-02-27 DIAGNOSIS — E118 Type 2 diabetes mellitus with unspecified complications: Principal | ICD-10-CM

## 2014-02-27 LAB — POCT URINALYSIS DIPSTICK
Bilirubin, UA: NEGATIVE
Glucose, UA: NEGATIVE
Nitrite, UA: NEGATIVE
PROTEIN UA: NEGATIVE
Spec Grav, UA: >=1.03
UROBILINOGEN UA: 0.2
pH, UA: 6

## 2014-02-27 LAB — BASIC METABOLIC PANEL
BUN: 17 mg/dL (ref 6–23)
CO2: 29 meq/L (ref 19–32)
Calcium: 9.4 mg/dL (ref 8.4–10.5)
Chloride: 103 mEq/L (ref 96–112)
Creatinine, Ser: 0.8 mg/dL (ref 0.4–1.2)
GFR: 82.12 mL/min (ref >60.00)
Glucose, Bld: 134 mg/dL — ABNORMAL HIGH (ref 70–99)
POTASSIUM: 3.7 meq/L (ref 3.5–5.1)
Sodium: 140 mEq/L (ref 135–145)

## 2014-02-27 LAB — HEMOGLOBIN A1C: HEMOGLOBIN A1C: 7.1 % — AB (ref 4.6–6.5)

## 2014-02-27 LAB — LIPID PANEL
Cholesterol: 106 mg/dL (ref 0–200)
HDL: 47.8 mg/dL (ref >39.00)
LDL CALC: 39 mg/dL (ref 0–99)
Total CHOL/HDL Ratio: 2
Triglycerides: 96 mg/dL (ref 0.0–149.0)
VLDL: 19.2 mg/dL (ref 0.0–40.0)

## 2014-02-27 LAB — HEPATIC FUNCTION PANEL
ALT: 19 U/L (ref 0–35)
AST: 21 U/L (ref 0–37)
Albumin: 4.5 g/dL (ref 3.5–5.2)
Alkaline Phosphatase: 44 U/L (ref 39–117)
BILIRUBIN TOTAL: 0.7 mg/dL (ref 0.3–1.2)
Bilirubin, Direct: 0.1 mg/dL (ref 0.0–0.3)
Total Protein: 7.3 g/dL (ref 6.0–8.3)

## 2014-02-27 LAB — HM DIABETES FOOT EXAM

## 2014-02-27 MED ORDER — LOSARTAN POTASSIUM 25 MG PO TABS: 25.0000 mg | ORAL_TABLET | Freq: Every day | ORAL | Status: DC

## 2014-02-27 MED ORDER — GLUCOSE BLOOD VI STRP: ORAL_STRIP | Status: DC

## 2014-02-27 MED ORDER — ROSUVASTATIN CALCIUM 20 MG PO TABS: 20.0000 mg | ORAL_TABLET | Freq: Every day | ORAL | Status: DC

## 2014-02-27 MED ORDER — ONETOUCH DELICA LANCETS 33G MISC: Status: DC

## 2014-02-27 MED ORDER — METFORMIN HCL ER 500 MG PO TB24: ORAL_TABLET | ORAL | Status: DC

## 2014-02-27 MED ORDER — NEBIVOLOL HCL 5 MG PO TABS: 5.0000 mg | ORAL_TABLET | Freq: Every day | ORAL | Status: DC

## 2014-02-27 MED ORDER — FUROSEMIDE 20 MG PO TABS: 10.0000 mg | ORAL_TABLET | Freq: Every day | ORAL | Status: DC

## 2014-02-27 NOTE — Progress Notes (Signed)
[name], female   DOB: 10-24-1946, 68 y.o.   MRN: [id number]   Subjective:    [name], female    DOB: 11/01/1946, 68 y.o.   MRN: [id number] HPI  HPI HYPERTENSION  Blood pressure range-not checking  Chest pain- no      Dyspnea- no Lightheadedness- no   Edema- no Other side effects - no Medication compliance: good Low salt diet- yes  DIABETES  Blood Sugar ranges- average 102  Polyuria- no New Visual problems- no Hypoglycemic symptoms- no Other side effects-no Medication compliance - good Last eye exam- due Foot exam- today  HYPERLIPIDEMIA  Medication compliance- good RUQ pain- no  Muscle aches- no Other side effects-no  ROS See HPI above   PMH Smoking Status noted            Objective:    BP 120/70  Pulse 69  Temp(Src) 98 F (36.7 C) (Oral)  Wt 140 lb (63.504 kg)  SpO2 97% General appearance: alert, cooperative, appears stated age and no distress Neck: no adenopathy, no carotid bruit, no JVD, supple, symmetrical, trachea midline and thyroid not enlarged, symmetric, no tenderness/mass/nodules Lungs: clear to auscultation bilaterally Heart: S1, S2 normal Extremities: extremities normal, atraumatic, no cyanosis or edema Sensory exam of the foot is normal, tested with the monofilament. Good pulses, no lesions or ulcers, good peripheral pulses.        Assessment & Plan:  1. Type II or unspecified type diabetes mellitus with unspecified complication, uncontrolled con't meds--  Check labs - Basic metabolic panel - Hemoglobin A1c - POCT urinalysis dipstick - glucose blood (ONE TOUCH ULTRA TEST) test strip; Check Blood sugar daily  Dispense: 100 each; Refill: 12 - ONETOUCH DELICA LANCETS 73S MISC; Check blood sugar daily  Dispense: 100 each; Refill: 12  2. Other and unspecified hyperlipidemia Check labs - Hepatic function panel - Lipid panel - POCT urinalysis dipstick - rosuvastatin (CRESTOR) 20 MG tablet; Take 1  tablet (20 mg total) by mouth daily.  Dispense: 90 tablet; Refill: 3  3. HTN (hypertension) Stable--con't meds - Basic metabolic panel - POCT urinalysis dipstick - losartan (COZAAR) 25 MG tablet; Take 1 tablet (25 mg total) by mouth daily.  Dispense: 30 tablet; Refill: 1 - nebivolol (BYSTOLIC) 5 MG tablet; Take 1 tablet (5 mg total) by mouth daily.  Dispense: 90 tablet; Refill: 4 - furosemide (LASIX) 20 MG tablet; Take 0.5 tablets (10 mg total) by mouth daily.  Dispense: 45 tablet; Refill: 2  4. Diabetes con't meds and glucometer checkss - losartan (COZAAR) 25 MG tablet; Take 1 tablet (25 mg total) by mouth daily.  Dispense: 30 tablet; Refill: 1 - metFORMIN (GLUCOPHAGE XR) 500 MG 24 hr tablet; Take 1 in am and 2 in pm  Dispense: 90 tablet; Refill: 5

## 2014-02-27 NOTE — Patient Instructions (Signed)
Diabetes and Standards of Medical Care  Diabetes is complicated. You may find that your diabetes team includes a dietitian, nurse, diabetes educator, eye doctor, and more. To help everyone know what is going on and to help you get the care you deserve, the following schedule of care was developed to help keep you on track. Below are the tests, exams, vaccines, medicines, education, and plans you will need. HbA1c test This test shows how well you have controlled your glucose over the past 2 3 months. It is used to see if your diabetes management plan needs to be adjusted.   It is performed at least 2 times a year if you are meeting treatment goals.  It is performed 4 times a year if therapy has changed or if you are not meeting treatment goals. Blood pressure test  This test is performed at every routine medical visit. The goal is less than 140/90 mmHg for most people, but 130/80 mmHg in some cases. Ask your health care provider about your goal. Dental exam  Follow up with the dentist regularly. Eye exam  If you are diagnosed with type 1 diabetes as a child, get an exam upon reaching the age of 56 years or older and have had diabetes for 3 5 years. Yearly eye exams are recommended after that initial eye exam.  If you are diagnosed with type 1 diabetes as an adult, get an exam within 5 years of diagnosis and then yearly.  If you are diagnosed with type 2 diabetes, get an exam as soon as possible after the diagnosis and then yearly. Foot care exam  Visual foot exams are performed at every routine medical visit. The exams check for cuts, injuries, or other problems with the feet.  A comprehensive foot exam should be done yearly. This includes visual inspection as well as assessing foot pulses and testing for loss of sensation.  Check your feet nightly for cuts, injuries, or other problems with your feet. Tell your health care provider if anything is not healing. Kidney function test (urine  microalbumin)  This test is performed once a year.  Type 1 diabetes: The first test is performed 5 years after diagnosis.  Type 2 diabetes: The first test is performed at the time of diagnosis.  A serum creatinine and estimated glomerular filtration rate (eGFR) test is done once a year to assess the level of chronic kidney disease (CKD), if present. Lipid profile (cholesterol, HDL, LDL, triglycerides)  Performed every 5 years for most people.  The goal for LDL is less than 100 mg/dL. If you are at high risk, the goal is less than 70 mg/dL.  The goal for HDL is 40 mg/dL 50 mg/dL for men and 50 mg/dL 60 mg/dL for women. An HDL cholesterol of 60 mg/dL or higher gives some protection against heart disease.  The goal for triglycerides is less than 150 mg/dL. Influenza vaccine, pneumococcal vaccine, and hepatitis B vaccine  The influenza vaccine is recommended yearly.  The pneumococcal vaccine is generally given once in a lifetime. However, there are some instances when another vaccination is recommended. Check with your health care provider.  The hepatitis B vaccine is also recommended for adults with diabetes. Diabetes self-management education  Education is recommended at diagnosis and ongoing as needed. Treatment plan  Your treatment plan is reviewed at every medical visit. Document Released: 10/05/2009 Document Revised: 08/10/2013 Document Reviewed: 05/10/2013 [facility] Patient Information 2014 [place].

## 2014-02-27 NOTE — Progress Notes (Signed)
Pre visit review using our clinic review tool, if applicable. No additional management support is needed unless otherwise documented below in the visit note. 

## 2014-02-27 NOTE — Addendum Note (Signed)
Addended by: Ewing Schlein on: 02/27/2014 09:45 AM   Modules accepted: Orders

## 2014-02-27 NOTE — Addendum Note (Signed)
Addended by: Modena Morrow D on: 02/27/2014 04:50 PM   Modules accepted: Orders

## 2014-02-28 ENCOUNTER — Telehealth: Payer: Self-pay | Admitting: Family Medicine

## 2014-02-28 NOTE — Telephone Encounter (Signed)
Relevant patient education mailed to patient.  

## 2014-03-01 ENCOUNTER — Telehealth: Payer: Self-pay

## 2014-03-01 LAB — URINE CULTURE
Colony Count: NO GROWTH
Organism ID, Bacteria: NO GROWTH

## 2014-03-01 NOTE — Telephone Encounter (Signed)
Relevant patient education mailed to patient.  

## 2014-03-03 MED ORDER — SITAGLIPTIN-METFORMIN HCL ER 100-1000 MG PO TB24
1.0000 | ORAL_TABLET | Freq: Every day | ORAL | Status: DC
Start: 2014-03-03 — End: 2014-03-07

## 2014-03-06 ENCOUNTER — Telehealth: Payer: Self-pay

## 2014-03-06 DIAGNOSIS — E119 Type 2 diabetes mellitus without complications: Secondary | ICD-10-CM

## 2014-03-06 NOTE — Telephone Encounter (Signed)
It is the pancreas that it can cause a problem with ----not GB usually---  But if she has any abd discomfort stop it and call us

## 2014-03-06 NOTE — Telephone Encounter (Signed)
Spoke with patient and she has a Hx of Gallstones and she is concerned about taking the Janumet, she wants to know what it can cause and what damage can it do to the Gallbladder since it says not to take if you have gallstones. Please advise      KP   Results from 01/14/06: 1. 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 No evidence of perforation or left to right shunt. 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  2. Normal coronary arteries.  3. Normal LV function and cavity size.  4. Small hiatal hernia.  5. Probable gallstones with fatty liver.  6. Scarring at the left base which may be dependent atelectasis. Suggest follow-up chest x-ray or CT scanning in 3 months.

## 2014-03-07 MED ORDER — METFORMIN HCL ER 500 MG PO TB24: ORAL_TABLET | ORAL | Status: DC

## 2014-03-07 NOTE — Telephone Encounter (Signed)
Spoke with patient and she wanted to know if she can just add 2 in the morning and 2 in the evening of the Metformin. Per Dr.Lowne this was ok. [facility] per patient request       KP

## 2014-03-07 NOTE — Telephone Encounter (Signed)
Per patient the insert advised not to take if having gall bladder issues. Please advise    KP

## 2014-03-07 NOTE — Telephone Encounter (Signed)
SWITCH BACK TO METFORMIN AS BEFORE  START FARZIGA DAILY--- CAN GIVE SAMPLES

## 2014-04-12 ENCOUNTER — Telehealth: Payer: Self-pay

## 2014-04-12 NOTE — Telephone Encounter (Signed)
Call from the patient and she stated she is due to have her wisdom teeth extracted and she was advised by the oral surgeon to take her med's in the morning without food. She said she takes metformin and wanted to know if this is ok by Dr.Lowne. My computer went down and I discussed with Dr.Lowne who verbally responded that it was ok for the patient to take the medication on an empty stomach. I have discussed with the patient who said she is concerned that if she takes the medication and can not eat for that day that her blood sugars are going to drop. With that said she wanted to know if she should still take the Metformin.     Please advise      KP

## 2014-04-12 NOTE — Telephone Encounter (Signed)
I spoke with Nicky Pugh, NP, concerning question about Metformin. She verbally instructed for the patient to skip her morning does and only take her evening dose. Called and spoke with patient and she is in agreement with this plan. JG//CMA

## 2014-04-12 NOTE — Telephone Encounter (Signed)
thx

## 2014-05-01 DIAGNOSIS — E119 Type 2 diabetes mellitus without complications: Secondary | ICD-10-CM | POA: Diagnosis not present

## 2014-05-01 DIAGNOSIS — H524 Presbyopia: Secondary | ICD-10-CM | POA: Diagnosis not present

## 2014-05-01 LAB — HM DIABETES EYE EXAM

## 2014-05-09 ENCOUNTER — Encounter: Payer: Self-pay | Admitting: Internal Medicine

## 2014-05-09 ENCOUNTER — Ambulatory Visit (INDEPENDENT_AMBULATORY_CARE_PROVIDER_SITE_OTHER): Payer: Medicare Other | Admitting: Internal Medicine

## 2014-05-09 VITALS — BP 142/72 | HR 68 | Temp 97.7°F | Wt 138.0 lb

## 2014-05-09 DIAGNOSIS — J309 Allergic rhinitis, unspecified: Secondary | ICD-10-CM

## 2014-05-09 DIAGNOSIS — J302 Other seasonal allergic rhinitis: Secondary | ICD-10-CM

## 2014-05-09 NOTE — Progress Notes (Signed)
Pre-visit discussion using our clinic review tool. No additional management support is needed unless otherwise documented below in the visit note.  

## 2014-05-09 NOTE — Progress Notes (Signed)
Subjective:    [name], female    DOB: [birth year], 68 y.o.   MRN: [id number]  DOS:  05/09/2014 Type of  visit: Acute visit, allergies? Symptoms started 5 days ago with a sore throat which is now gone, now having cough, clear nasal discharge, clear sputum. She is concerned because she will visit a elderly family member in the next few days Taking OTC Allegra   ROS Denies fever or chills No  nausea, vomiting, diarrhea. No  aches or fatigue Denies sneezing or itchy eyes/nose  Past Medical History  Diagnosis Date  . Overweight   . HYPERTENSION   . MITRAL VALVE PROLAPSE   . PHARYNGITIS, ACUTE   . OSTEOPENIA   . Diabetes mellitus without complication   . Heart aneurysm     echo done every year    Past Surgical History  Procedure Laterality Date  . Abdominal hysterectomy      History   Social History  . Marital Status: Married    Spouse Name: N/A    Number of Children: N/A  . Years of Education: N/A   Occupational History  . Not on file.   Social History Main Topics  . Smoking status: Never Smoker   . Smokeless tobacco: Never Used  . Alcohol Use: No  . Drug Use: No  . Sexual Activity: Not on file   Other Topics Concern  . Not on file   Social History Narrative  . No narrative on file        Medication List       This list is accurate as of: 05/09/14  6:24 PM.  Always use your most recent med list.               aspirin EC 81 MG tablet  Take 1 tablet (81 mg total) by mouth daily.     furosemide 20 MG tablet  Commonly known as:  LASIX  Take 0.5 tablets (10 mg total) by mouth daily.     glucose blood test strip  Commonly known as:  ONE TOUCH ULTRA TEST  Check Blood sugar daily     losartan 25 MG tablet  Commonly known as:  COZAAR  Take 1 tablet (25 mg total) by mouth daily.     meclizine 25 MG tablet  Commonly known as:  ANTIVERT  Take 25 mg by mouth as needed for dizziness.     metFORMIN 500 MG 24 hr tablet  Commonly  known as:  GLUCOPHAGE XR  Take 2 tablets by mouth in the morning and 2 in the evening     nebivolol 5 MG tablet  Commonly known as:  BYSTOLIC  Take 1 tablet (5 mg total) by mouth daily.     ONETOUCH DELICA LANCETS 84Z Misc  Check blood sugar daily     PRESERVISION AREDS 2 PO  Take 2 capsules by mouth once a week.     rosuvastatin 20 MG tablet  Commonly known as:  CRESTOR  Take 1 tablet (20 mg total) by mouth daily.     VITAMIN B-12 PO  Take 2 tablets by mouth once a week.     VITAMIN D (CHOLECALCIFEROL) PO  Take 2 tablets by mouth once a week.           Objective:   Physical Exam BP 142/72  Pulse 68  Temp(Src) 97.7 F (36.5 C) (Oral)  Wt 138 lb (62.596 kg)  SpO2 99% General -- alert, well-developed, NAD.  HEENT-- Not pale. TMs wax B, throat symmetric, no redness or discharge. Face symmetric, sinuses not tender to palpation. Nose slt  congested. Lungs -- normal respiratory effort, no intercostal retractions, no accessory muscle use, and normal breath sounds.  Heart-- normal rate, regular rhythm, no murmur.   Neurologic--  alert & oriented X3. Speech normal, gait normal, strength normal in all extremities.    Psych-- Cognition and judgment appear intact. Cooperative with normal attention span and concentration. No anxious or depressed appearing.          Assessment & Plan:  Allergies, Respiratory symptoms in the last few days without evidence of infection, likely allergies. See instructions

## 2014-05-09 NOTE — Patient Instructions (Addendum)
For cough, take delsyum as needed   OTC Nasocort: 2 nasal sprays on each side of the nose daily until you feel better Allegra OTC Call if no better in few days or if fever chills, increase cough

## 2014-05-22 ENCOUNTER — Ambulatory Visit: Payer: Medicare Other

## 2014-05-29 ENCOUNTER — Encounter: Payer: Self-pay | Admitting: Family Medicine

## 2014-05-29 ENCOUNTER — Ambulatory Visit (INDEPENDENT_AMBULATORY_CARE_PROVIDER_SITE_OTHER): Payer: Medicare Other | Admitting: Family Medicine

## 2014-05-29 VITALS — BP 125/78 | HR 72 | Temp 98.1°F | Wt 135.0 lb

## 2014-05-29 DIAGNOSIS — H6120 Impacted cerumen, unspecified ear: Secondary | ICD-10-CM

## 2014-05-29 DIAGNOSIS — E1159 Type 2 diabetes mellitus with other circulatory complications: Secondary | ICD-10-CM

## 2014-05-29 DIAGNOSIS — E2839 Other primary ovarian failure: Secondary | ICD-10-CM

## 2014-05-29 DIAGNOSIS — Z1231 Encounter for screening mammogram for malignant neoplasm of breast: Secondary | ICD-10-CM | POA: Diagnosis not present

## 2014-05-29 DIAGNOSIS — I10 Essential (primary) hypertension: Secondary | ICD-10-CM | POA: Diagnosis not present

## 2014-05-29 LAB — HEPATIC FUNCTION PANEL
ALT: 14 U/L (ref 0–35)
AST: 22 U/L (ref 0–37)
Albumin: 4.4 g/dL (ref 3.5–5.2)
Alkaline Phosphatase: 46 U/L (ref 39–117)
Bilirubin, Direct: 0 mg/dL (ref 0.0–0.3)
TOTAL PROTEIN: 7.4 g/dL (ref 6.0–8.3)
Total Bilirubin: 0.3 mg/dL (ref 0.2–1.2)

## 2014-05-29 LAB — BASIC METABOLIC PANEL
BUN: 19 mg/dL (ref 6–23)
CALCIUM: 9.4 mg/dL (ref 8.4–10.5)
CO2: 28 meq/L (ref 19–32)
CREATININE: 0.8 mg/dL (ref 0.4–1.2)
Chloride: 102 mEq/L (ref 96–112)
GFR: 82.06 mL/min (ref >60.00)
GLUCOSE: 103 mg/dL — AB (ref 70–99)
Potassium: 4.1 mEq/L (ref 3.5–5.1)
Sodium: 139 mEq/L (ref 135–145)

## 2014-05-29 LAB — LIPID PANEL
CHOL/HDL RATIO: 2
CHOLESTEROL: 111 mg/dL (ref 0–200)
HDL: 46.1 mg/dL (ref >39.00)
LDL Cholesterol: 44 mg/dL (ref 0–99)
NonHDL: 64.9
TRIGLYCERIDES: 107 mg/dL (ref 0.0–149.0)
VLDL: 21.4 mg/dL (ref 0.0–40.0)

## 2014-05-29 LAB — HEMOGLOBIN A1C: Hgb A1c MFr Bld: 6.8 % — ABNORMAL HIGH (ref 4.6–6.5)

## 2014-05-29 NOTE — Progress Notes (Signed)
Pre visit review using our clinic review tool, if applicable. No additional management support is needed unless otherwise documented below in the visit note. 

## 2014-05-29 NOTE — Progress Notes (Signed)
   Subjective:    [name], female    DOB: 03-Feb-1946, 68 y.o.   MRN: [id number]  Diabetes Pertinent negatives for hypoglycemia include no dizziness or headaches. Pertinent negatives for diabetes include no chest pain, no fatigue, no polydipsia, no polyphagia and no polyuria.   HYPERTENSION  Blood pressure range-normal  Chest pain- no      Dyspnea- no Lightheadedness- no   Edema- no Other side effects - no   Medication compliance: good Low salt diet- yes  DIABETES  Blood Sugar ranges-good per pt  Polyuria- no New Visual problems- no Hypoglycemic symptoms- no Other side effects-no Medication compliance - good Last eye exam- last month Foot exam- today  HYPERLIPIDEMIA  Medication compliance- good RUQ pain- no  Muscle aches- no Other side effects-no  Review of Systems  Constitutional: Negative for diaphoresis, appetite change, fatigue and unexpected weight change.  Eyes: Negative for pain, redness and visual disturbance.  Respiratory: Negative for cough, chest tightness, shortness of breath and wheezing.   Cardiovascular: Negative for chest pain, palpitations and leg swelling.  Endocrine: Negative for cold intolerance, heat intolerance, polydipsia, polyphagia and polyuria.  Genitourinary: Negative for dysuria, frequency and difficulty urinating.  Neurological: Negative for dizziness, light-headedness, numbness and headaches.       Objective:   Physical Exam  Constitutional: She is oriented to person, place, and time. She appears well-developed and well-nourished.  HENT:  Head: Normocephalic.  B/l cerumen impaction--- irrigated-- L one clear  R ear-- some wax removed with hoop still some deep in canal  Eyes: Conjunctivae and EOM are normal.  Neck: Normal range of motion. Neck supple. No JVD present. Carotid bruit is not present. No thyromegaly present.  Cardiovascular: Normal rate, regular rhythm and normal heart sounds.   No murmur  heard. Pulmonary/Chest: Effort normal and breath sounds normal. No respiratory distress. She has no wheezes. She has no rales. She exhibits no tenderness.  Musculoskeletal: She exhibits no edema.  Neurological: She is alert and oriented to person, place, and time.  Psychiatric: She has a normal mood and affect.  Sensory exam of the foot is normal, tested with the monofilament. Good pulses, no lesions or ulcers, good peripheral pulses.         Assessment & Plan:  1. Type II or unspecified type diabetes mellitus with peripheral circulatory disorders, uncontrolled(250.72) Check labs, cont meds - Basic metabolic panel - Hepatic function panel - Hemoglobin A1c - Lipid panel  2. HTN (hypertension) stable - Basic metabolic panel - Hepatic function panel - Hemoglobin A1c - Lipid panel  3. Estrogen deficiency   - DG Bone Density; Future  4. Other screening mammogram   - MM DIGITAL SCREENING BILATERAL; Future  5. Cerumen impaction Do not use q tips Use cerumenex or peroxide

## 2014-05-29 NOTE — Patient Instructions (Signed)

## 2014-08-29 ENCOUNTER — Other Ambulatory Visit: Payer: Self-pay | Admitting: Family Medicine

## 2014-09-05 ENCOUNTER — Ambulatory Visit (INDEPENDENT_AMBULATORY_CARE_PROVIDER_SITE_OTHER): Payer: Medicare Other | Admitting: Family Medicine

## 2014-09-05 ENCOUNTER — Encounter: Payer: Self-pay | Admitting: Family Medicine

## 2014-09-05 VITALS — BP 152/78 | HR 60 | Temp 98.1°F | Ht 63.0 in | Wt 140.2 lb

## 2014-09-05 DIAGNOSIS — I10 Essential (primary) hypertension: Secondary | ICD-10-CM

## 2014-09-05 DIAGNOSIS — E1159 Type 2 diabetes mellitus with other circulatory complications: Secondary | ICD-10-CM

## 2014-09-05 DIAGNOSIS — Z23 Encounter for immunization: Secondary | ICD-10-CM | POA: Diagnosis not present

## 2014-09-05 DIAGNOSIS — E785 Hyperlipidemia, unspecified: Secondary | ICD-10-CM

## 2014-09-05 DIAGNOSIS — R8290 Unspecified abnormal findings in urine: Secondary | ICD-10-CM

## 2014-09-05 DIAGNOSIS — Z0000 Encounter for general adult medical examination without abnormal findings: Secondary | ICD-10-CM

## 2014-09-05 DIAGNOSIS — R82998 Other abnormal findings in urine: Secondary | ICD-10-CM

## 2014-09-05 LAB — BASIC METABOLIC PANEL
BUN: 17 mg/dL (ref 6–23)
CO2: 28 mEq/L (ref 19–32)
Calcium: 9.4 mg/dL (ref 8.4–10.5)
Chloride: 102 mEq/L (ref 96–112)
Creatinine, Ser: 0.8 mg/dL (ref 0.4–1.2)
GFR: 80.75 mL/min (ref >60.00)
GLUCOSE: 111 mg/dL — AB (ref 70–99)
POTASSIUM: 3.8 meq/L (ref 3.5–5.1)
Sodium: 140 mEq/L (ref 135–145)

## 2014-09-05 LAB — POCT URINALYSIS DIPSTICK
GLUCOSE UA: NEGATIVE
Nitrite, UA: NEGATIVE
Spec Grav, UA: 1.025
Urobilinogen, UA: NEGATIVE
pH, UA: 6

## 2014-09-05 LAB — HEPATIC FUNCTION PANEL
ALBUMIN: 4.2 g/dL (ref 3.5–5.2)
ALT: 15 U/L (ref 0–35)
AST: 18 U/L (ref 0–37)
Alkaline Phosphatase: 43 U/L (ref 39–117)
Bilirubin, Direct: 0 mg/dL (ref 0.0–0.3)
TOTAL PROTEIN: 7.2 g/dL (ref 6.0–8.3)
Total Bilirubin: 0.4 mg/dL (ref 0.2–1.2)

## 2014-09-05 LAB — LIPID PANEL
CHOLESTEROL: 95 mg/dL (ref 0–200)
HDL: 37.5 mg/dL — ABNORMAL LOW (ref >39.00)
LDL Cholesterol: 38 mg/dL (ref 0–99)
NonHDL: 57.5
Total CHOL/HDL Ratio: 3
Triglycerides: 98 mg/dL (ref 0.0–149.0)
VLDL: 19.6 mg/dL (ref 0.0–40.0)

## 2014-09-05 LAB — HEMOGLOBIN A1C: Hgb A1c MFr Bld: 6.8 % — ABNORMAL HIGH (ref 4.6–6.5)

## 2014-09-05 NOTE — Progress Notes (Signed)
Subjective:    Cynthia Burns is a 68 y.o. female who presents for Medicare Initial preventive examination.  Preventive Screening-Counseling & Management  Tobacco History  Smoking status  . Never Smoker   Smokeless tobacco  . Never Used     Problems Prior to Visit 1. none  Current Problems (verified) Patient Active Problem List   Diagnosis Date Noted  . Type II or unspecified type diabetes mellitus with unspecified complication, uncontrolled 09/16/2013  . Other and unspecified hyperlipidemia 09/05/2013  . 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  . OVERWEIGHT 02/07/2009  . PHARYNGITIS, ACUTE 06/03/2007  . HYPERTENSION 05/18/2007  . MITRAL VALVE PROLAPSE 05/18/2007  . OSTEOPENIA 05/18/2007    Medications Prior to Visit Current Outpatient Prescriptions on File Prior to Visit  Medication Sig Dispense Refill  . aspirin EC 81 MG tablet Take 1 tablet (81 mg total) by mouth daily.  90 tablet  3  . Cyanocobalamin (VITAMIN B-12 PO) Take 2 tablets by mouth once a week.      . furosemide (LASIX) 20 MG tablet Take 0.5 tablets (10 mg total) by mouth daily.  45 tablet  2  . glucose blood (ONE TOUCH ULTRA TEST) test strip Check Blood sugar daily  100 each  12  . losartan (COZAAR) 25 MG tablet Take 1 tablet (25 mg total) by mouth daily.  30 tablet  1  . meclizine (ANTIVERT) 25 MG tablet Take 25 mg by mouth as needed for dizziness.      . metFORMIN (GLUCOPHAGE-XR) 500 MG 24 hr tablet TAKE 2 TABLETS IN THE MORNING AND 2 TABLETS IN THE EVENING  360 tablet  1  . Multiple Vitamins-Minerals (PRESERVISION AREDS 2 PO) Take 2 capsules by mouth once a week.      . nebivolol (BYSTOLIC) 5 MG tablet Take 1 tablet (5 mg total) by mouth daily.  90 tablet  4  . ONETOUCH DELICA LANCETS 01U MISC Check blood sugar daily  100 each  12  . rosuvastatin (CRESTOR) 20 MG tablet Take 1 tablet (20 mg total) by mouth daily.  90 tablet  3  . VITAMIN D, CHOLECALCIFEROL, PO Take 2 tablets by mouth once a week.        No current facility-administered medications on file prior to visit.    Current Medications (verified) Current Outpatient Prescriptions  Medication Sig Dispense Refill  . aspirin EC 81 MG tablet Take 1 tablet (81 mg total) by mouth daily.  90 tablet  3  . Biotin (PA BIOTIN) 1000 MCG tablet Take 1,000 mcg by mouth daily.      . Cyanocobalamin (VITAMIN B-12 PO) Take 2 tablets by mouth once a week.      . furosemide (LASIX) 20 MG tablet Take 0.5 tablets (10 mg total) by mouth daily.  45 tablet  2  . glucose blood (ONE TOUCH ULTRA TEST) test strip Check Blood sugar daily  100 each  12  . losartan (COZAAR) 25 MG tablet Take 1 tablet (25 mg total) by mouth daily.  30 tablet  1  . meclizine (ANTIVERT) 25 MG tablet Take 25 mg by mouth as needed for dizziness.      . metFORMIN (GLUCOPHAGE-XR) 500 MG 24 hr tablet TAKE 2 TABLETS IN THE MORNING AND 2 TABLETS IN THE EVENING  360 tablet  1  . Multiple Vitamins-Minerals (PRESERVISION AREDS 2 PO) Take 2 capsules by mouth once a week.      . nebivolol (BYSTOLIC) 5 MG tablet Take 1 tablet (5 mg  total) by mouth daily.  90 tablet  4  . ONETOUCH DELICA LANCETS 14E MISC Check blood sugar daily  100 each  12  . rosuvastatin (CRESTOR) 20 MG tablet Take 1 tablet (20 mg total) by mouth daily.  90 tablet  3  . VITAMIN D, CHOLECALCIFEROL, PO Take 2 tablets by mouth once a week.       No current facility-administered medications for this visit.     Allergies (verified) Codeine; Mucinex; Advicor; Amlodipine; Lisinopril-hydrochlorothiazide; Other; Ramipril; and Sulfonamide derivatives   PAST HISTORY  Family History Family History  Problem Relation Age of Onset  . Emphysema Father   . Diabetes Father   . Heart disease Mother     Had a stent  . Hypertension    . Colon cancer Neg Hx     Social History History  Substance Use Topics  . Smoking status: Never Smoker   . Smokeless tobacco: Never Used  . Alcohol Use: No     Are there smokers in your home  (other than you)? No  Risk Factors Current exercise habits: Home exercise routine includes walking.  Dietary issues discussed: diabetes   Cardiac risk factors: advanced age (older than 57 for men, 70 for women), diabetes mellitus, dyslipidemia and hypertension.  Depression Screen (Note: if answer to either of the following is "Yes", a more complete depression screening is indicated)   Over the past 2 weeks, have you felt down, depressed or hopeless? No  Over the past 2 weeks, have you felt little interest or pleasure in doing things? No  Have you lost interest or pleasure in daily life? No  Do you often feel hopeless? No  Do you cry easily over simple problems? No  Activities of Daily Living In your present state of health, do you have any difficulty performing the following activities?:  Driving? No Managing money?  No Feeding yourself? No Getting from bed to chair? No Climbing a flight of stairs? No Preparing food and eating?: No Bathing or showering? No Getting dressed: No Getting to the toilet? No Using the toilet:No Moving around from place to place: No In the past year have you fallen or had a near fall?:No   Are you sexually active?  Yes  Do you have more than one partner?  No  Hearing Difficulties: No Do you often ask people to speak up or repeat themselves? No Do you experience ringing or noises in your ears? No Do you have difficulty understanding soft or whispered voices? No   Do you feel that you have a problem with memory? No  Do you often misplace items? No  Do you feel safe at home?  No  Cognitive Testing  Alert? Yes  Normal Appearance?Yes  Oriented to person? Yes  Place? Yes   Time? Yes  Recall of three objects?  Yes  Can perform simple calculations? Yes  Displays appropriate judgment?Yes  Can read the correct time from a watch face?Yes   Advanced Directives have been discussed with the patient? Yes  List the Names of Other Physician/Practitioners  you currently use: 1.  opth-[facility] 2. Dentist-- [facility] 3. Card- [facility] 4. Derm-- [facility]  Indicate any recent Medical Services you may have received from other than [facility] providers in the past year (date may be approximate).  Immunization History  Administered Date(s) Administered  . Influenza Whole 10/20/2007  . Influenza,inj,Quad PF,36+ Mos 09/05/2013, 09/05/2014  . Pneumococcal Conjugate-13 02/27/2014  . Pneumococcal Polysaccharide-23 09/06/2011  . Zoster 09/14/2013  Screening Tests Health Maintenance  Topic Date Due  . Tetanus/tdap  12/19/1966  . Pneumococcal Polysaccharide Vaccine Age 32 And Over  09/16/2014 (Originally 12/19/2012)  . Hemoglobin A1c  11/28/2014  . Foot Exam  02/28/2015  . Ophthalmology Exam  05/02/2015  . Influenza Vaccine  07/23/2015  . Mammogram  10/04/2015  . Colonoscopy  11/09/2023  . Zostavax  Completed    All answers were reviewed with the patient and necessary referrals were made:  Garnet Koyanagi, DO   09/05/2014   History reviewed:  She  has a past medical history of Overweight(278.02); HYPERTENSION; MITRAL VALVE PROLAPSE; PHARYNGITIS, ACUTE; OSTEOPENIA; Diabetes mellitus without complication; and Heart aneurysm. She  does not have any pertinent problems on file. She  has past surgical history that includes Abdominal hysterectomy. Her family history includes Diabetes in her father; Emphysema in her father; Heart disease in her mother; Hypertension in an other family member. There is no history of Colon cancer. She  reports that she has never smoked. She has never used smokeless tobacco. She reports that she does not drink alcohol or use illicit drugs. She has a current medication list which includes the following prescription(s): aspirin ec, biotin, cyanocobalamin, furosemide, glucose blood, losartan, meclizine, metformin, multiple vitamins-minerals, nebivolol, onetouch delica lancets 36R, rosuvastatin, and cholecalciferol. Current Outpatient  Prescriptions on File Prior to Visit  Medication Sig Dispense Refill  . aspirin EC 81 MG tablet Take 1 tablet (81 mg total) by mouth daily.  90 tablet  3  . Cyanocobalamin (VITAMIN B-12 PO) Take 2 tablets by mouth once a week.      . furosemide (LASIX) 20 MG tablet Take 0.5 tablets (10 mg total) by mouth daily.  45 tablet  2  . glucose blood (ONE TOUCH ULTRA TEST) test strip Check Blood sugar daily  100 each  12  . losartan (COZAAR) 25 MG tablet Take 1 tablet (25 mg total) by mouth daily.  30 tablet  1  . meclizine (ANTIVERT) 25 MG tablet Take 25 mg by mouth as needed for dizziness.      . metFORMIN (GLUCOPHAGE-XR) 500 MG 24 hr tablet TAKE 2 TABLETS IN THE MORNING AND 2 TABLETS IN THE EVENING  360 tablet  1  . Multiple Vitamins-Minerals (PRESERVISION AREDS 2 PO) Take 2 capsules by mouth once a week.      . nebivolol (BYSTOLIC) 5 MG tablet Take 1 tablet (5 mg total) by mouth daily.  90 tablet  4  . ONETOUCH DELICA LANCETS 44R MISC Check blood sugar daily  100 each  12  . rosuvastatin (CRESTOR) 20 MG tablet Take 1 tablet (20 mg total) by mouth daily.  90 tablet  3  . VITAMIN D, CHOLECALCIFEROL, PO Take 2 tablets by mouth once a week.       No current facility-administered medications on file prior to visit.   She is allergic to codeine; mucinex; advicor; amlodipine; lisinopril-hydrochlorothiazide; other; ramipril; and sulfonamide derivatives.  Review of Systems  Review of Systems  Constitutional: Negative for activity change, appetite change and fatigue.  HENT: Negative for hearing loss, congestion, tinnitus and ear discharge.   Eyes: Negative for visual disturbance (see optho q1y -- vision corrected to 20/20 with glasses).  Respiratory: Negative for cough, chest tightness and shortness of breath.   Cardiovascular: Negative for chest pain, palpitations and leg swelling.  Gastrointestinal: Negative for abdominal pain, diarrhea, constipation and abdominal distention.  Genitourinary: Negative  for urgency, frequency, decreased urine volume and difficulty urinating.  Musculoskeletal: Negative  for back pain, arthralgias and gait problem.  Skin: Negative for color change, pallor and rash.  Neurological: Negative for dizziness, light-headedness, numbness and headaches.  Hematological: Negative for adenopathy. Does not bruise/bleed easily.  Psychiatric/Behavioral: Negative for suicidal ideas, confusion, sleep disturbance, self-injury, dysphoric mood, decreased concentration and agitation.  Pt is able to read and write and can do all ADLs No risk for falling No abuse/ violence in home     Objective:     Vision by Snellen chart: opth  Body mass index is 24.84 kg/(m^2). BP 152/78  Pulse 60  Temp(Src) 98.1 F (36.7 C) (Oral)  Ht 5\' 3"  (1.6 m)  Wt 140 lb 3.4 oz (63.6 kg)  BMI 24.84 kg/m2  SpO2 99%  BP 152/78  Pulse 60  Temp(Src) 98.1 F (36.7 C) (Oral)  Ht 5\' 3"  (1.6 m)  Wt 140 lb 3.4 oz (63.6 kg)  BMI 24.84 kg/m2  SpO2 99% General appearance: alert, cooperative, appears stated age and no distress Head: Normocephalic, without obvious abnormality, atraumatic Eyes: conjunctivae/corneas clear. PERRL, EOM's intact. Fundi benign. Ears: normal TM's and external ear canals both ears Nose: Nares normal. Septum midline. Mucosa normal. No drainage or sinus tenderness. Throat: lips, mucosa, and tongue normal; teeth and gums normal Neck: no adenopathy, no carotid bruit, no JVD, supple, symmetrical, trachea midline and thyroid not enlarged, symmetric, no tenderness/mass/nodules Back: symmetric, no curvature. ROM normal. No CVA tenderness. Lungs: clear to auscultation bilaterally Breasts: normal appearance, no masses or tenderness Heart: regular rate and rhythm, S1, S2 normal, no murmur, click, rub or gallop Abdomen: soft, non-tender; bowel sounds normal; no masses,  no organomegaly Pelvic: not indicated; status post hysterectomy, negative ROS Extremities: extremities normal,  atraumatic, no cyanosis or edema Pulses: 2+ and symmetric Skin: Skin color, texture, turgor normal. No rashes or lesions Lymph nodes: Cervical, supraclavicular, and axillary nodes normal. Neurologic: Alert and oriented X 3, normal strength and tone. Normal symmetric reflexes. Normal coordination and gait Psych-- no depression, no anxiety      Assessment:     cpe      Plan:     During the course of the visit the patient was educated and counseled about appropriate screening and preventive services including:    Influenza vaccine  Td vaccine  Screening mammography  Bone densitometry screening  Colorectal cancer screening  Diabetes screening  Glaucoma screening  Advanced directives: has an advanced directive - a copy HAS NOT been provided.  Diet review for nutrition referral? Yes ____  Not Indicated _x___   Patient Instructions (the written plan) was given to the patient.  Medicare Attestation I have personally reviewed: The patient's medical and social history Their use of alcohol, tobacco or illicit drugs Their current medications and supplements The patient's functional ability including ADLs,fall risks, home safety risks, cognitive, and hearing and visual impairment Diet and physical activities Evidence for depression or mood disorders  The patient's weight, height, BMI, and visual acuity have been recorded in the chart.  I have made referrals, counseling, and provided education to the patient based on review of the above and I have provided the patient with a written personalized care plan for preventive services.    1. Need for prophylactic vaccination and inoculation against influenza  - Flu Vaccine QUAD 36+ mos PF IM (Fluarix Quad PF)  2. Type II or unspecified type diabetes mellitus with peripheral circulatory disorders, uncontrolled(250.72) Check labs - Basic metabolic panel - Hemoglobin A1c - Microalbumin / creatinine urine ratio - POCT urinalysis  dipstick  3. Essential hypertension Stable, con't meds - Basic metabolic panel - CBC with Differential - POCT urinalysis dipstick  4. Other and unspecified hyperlipidemia Check labs - Hepatic function panel - Lipid panel  5. Medicare annual wellness visit, initial   Garnet Koyanagi, DO   09/05/2014

## 2014-09-05 NOTE — Patient Instructions (Signed)
Preventive Care for Adults A healthy lifestyle and preventive care can promote health and wellness. Preventive health guidelines for women include the following key practices.  A routine yearly physical is a good way to check with your health care provider about your health and preventive screening. It is a chance to share any concerns and updates on your health and to receive a thorough exam.  Visit your dentist for a routine exam and preventive care every 6 months. Brush your teeth twice a day and floss once a day. Good oral hygiene prevents tooth decay and gum disease.  The frequency of eye exams is based on your age, health, family medical history, use of contact lenses, and other factors. Follow your health care provider's recommendations for frequency of eye exams.  Eat a healthy diet. Foods like vegetables, fruits, whole grains, low-fat dairy products, and lean protein foods contain the nutrients you need without too many calories. Decrease your intake of foods high in solid fats, added sugars, and salt. Eat the right amount of calories for you.Get information about a proper diet from your health care provider, if necessary.  Regular physical exercise is one of the most important things you can do for your health. Most adults should get at least 150 minutes of moderate-intensity exercise (any activity that increases your heart rate and causes you to sweat) each week. In addition, most adults need muscle-strengthening exercises on 2 or more days a week.  Maintain a healthy weight. The body mass index (BMI) is a screening tool to identify possible weight problems. It provides an estimate of body fat based on height and weight. Your health care provider can find your BMI and can help you achieve or maintain a healthy weight.For adults 20 years and older:  A BMI below 18.5 is considered underweight.  A BMI of 18.5 to 24.9 is normal.  A BMI of 25 to 29.9 is considered overweight.  A BMI of  30 and above is considered obese.  Maintain normal blood lipids and cholesterol levels by exercising and minimizing your intake of saturated fat. Eat a balanced diet with plenty of fruit and vegetables. Blood tests for lipids and cholesterol should begin at age 67 and be repeated every 5 years. If your lipid or cholesterol levels are high, you are over 50, or you are at high risk for heart disease, you may need your cholesterol levels checked more frequently.Ongoing high lipid and cholesterol levels should be treated with medicines if diet and exercise are not working.  If you smoke, find out from your health care provider how to quit. If you do not use tobacco, do not start.  Lung cancer screening is recommended for adults aged 52-80 years who are at high risk for developing lung cancer because of a history of smoking. A yearly low-dose CT scan of the lungs is recommended for people who have at least a 30-pack-year history of smoking and are a current smoker or have quit within the past 15 years. A pack year of smoking is smoking an average of 1 pack of cigarettes a day for 1 year (for example: 1 pack a day for 30 years or 2 packs a day for 15 years). Yearly screening should continue until the smoker has stopped smoking for at least 15 years. Yearly screening should be stopped for people who develop a health problem that would prevent them from having lung cancer treatment.  If you are pregnant, do not drink alcohol. If you are breastfeeding,  be very cautious about drinking alcohol. If you are not pregnant and choose to drink alcohol, do not have more than 1 drink per day. One drink is considered to be 12 ounces (355 mL) of beer, 5 ounces (148 mL) of wine, or 1.5 ounces (44 mL) of liquor.  Avoid use of street drugs. Do not share needles with anyone. Ask for help if you need support or instructions about stopping the use of drugs.  High blood pressure causes heart disease and increases the risk of  stroke. Your blood pressure should be checked at least every 1 to 2 years. Ongoing high blood pressure should be treated with medicines if weight loss and exercise do not work.  If you are 75-52 years old, ask your health care provider if you should take aspirin to prevent strokes.  Diabetes screening involves taking a blood sample to check your fasting blood sugar level. This should be done once every 3 years, after age 15, if you are within normal weight and without risk factors for diabetes. Testing should be considered at a younger age or be carried out more frequently if you are overweight and have at least 1 risk factor for diabetes.  Breast cancer screening is essential preventive care for women. You should practice "breast self-awareness." This means understanding the normal appearance and feel of your breasts and may include breast self-examination. Any changes detected, no matter how small, should be reported to a health care provider. Women in their 58s and 30s should have a clinical breast exam (CBE) by a health care provider as part of a regular health exam every 1 to 3 years. After age 16, women should have a CBE every year. Starting at age 53, women should consider having a mammogram (breast X-ray test) every year. Women who have a family history of breast cancer should talk to their health care provider about genetic screening. Women at a high risk of breast cancer should talk to their health care providers about having an MRI and a mammogram every year.  Breast cancer gene (BRCA)-related cancer risk assessment is recommended for women who have family members with BRCA-related cancers. BRCA-related cancers include breast, ovarian, tubal, and peritoneal cancers. Having family members with these cancers may be associated with an increased risk for harmful changes (mutations) in the breast cancer genes BRCA1 and BRCA2. Results of the assessment will determine the need for genetic counseling and  BRCA1 and BRCA2 testing.  Routine pelvic exams to screen for cancer are no longer recommended for nonpregnant women who are considered low risk for cancer of the pelvic organs (ovaries, uterus, and vagina) and who do not have symptoms. Ask your health care provider if a screening pelvic exam is right for you.  If you have had past treatment for cervical cancer or a condition that could lead to cancer, you need Pap tests and screening for cancer for at least 20 years after your treatment. If Pap tests have been discontinued, your risk factors (such as having a new sexual partner) need to be reassessed to determine if screening should be resumed. Some women have medical problems that increase the chance of getting cervical cancer. In these cases, your health care provider may recommend more frequent screening and Pap tests.  The HPV test is an additional test that may be used for cervical cancer screening. The HPV test looks for the virus that can cause the cell changes on the cervix. The cells collected during the Pap test can be  tested for HPV. The HPV test could be used to screen women aged 30 years and older, and should be used in women of any age who have unclear Pap test results. After the age of 30, women should have HPV testing at the same frequency as a Pap test.  Colorectal cancer can be detected and often prevented. Most routine colorectal cancer screening begins at the age of 50 years and continues through age 75 years. However, your health care provider may recommend screening at an earlier age if you have risk factors for colon cancer. On a yearly basis, your health care provider may provide home test kits to check for hidden blood in the stool. Use of a small camera at the end of a tube, to directly examine the colon (sigmoidoscopy or colonoscopy), can detect the earliest forms of colorectal cancer. Talk to your health care provider about this at age 50, when routine screening begins. Direct  exam of the colon should be repeated every 5-10 years through age 75 years, unless early forms of pre-cancerous polyps or small growths are found.  People who are at an increased risk for hepatitis B should be screened for this virus. You are considered at high risk for hepatitis B if:  You were born in a country where hepatitis B occurs often. Talk with your health care provider about which countries are considered high risk.  Your parents were born in a high-risk country and you have not received a shot to protect against hepatitis B (hepatitis B vaccine).  You have HIV or AIDS.  You use needles to inject street drugs.  You live with, or have sex with, someone who has hepatitis B.  You get hemodialysis treatment.  You take certain medicines for conditions like cancer, organ transplantation, and autoimmune conditions.  Hepatitis C blood testing is recommended for all people born from 1945 through 1965 and any individual with known risks for hepatitis C.  Practice safe sex. Use condoms and avoid high-risk sexual practices to reduce the spread of sexually transmitted infections (STIs). STIs include gonorrhea, chlamydia, syphilis, trichomonas, herpes, HPV, and human immunodeficiency virus (HIV). Herpes, HIV, and HPV are viral illnesses that have no cure. They can result in disability, cancer, and death.  You should be screened for sexually transmitted illnesses (STIs) including gonorrhea and chlamydia if:  You are sexually active and are younger than 24 years.  You are older than 24 years and your health care provider tells you that you are at risk for this type of infection.  Your sexual activity has changed since you were last screened and you are at an increased risk for chlamydia or gonorrhea. Ask your health care provider if you are at risk.  If you are at risk of being infected with HIV, it is recommended that you take a prescription medicine daily to prevent HIV infection. This is  called preexposure prophylaxis (PrEP). You are considered at risk if:  You are a heterosexual woman, are sexually active, and are at increased risk for HIV infection.  You take drugs by injection.  You are sexually active with a partner who has HIV.  Talk with your health care provider about whether you are at high risk of being infected with HIV. If you choose to begin PrEP, you should first be tested for HIV. You should then be tested every 3 months for as long as you are taking PrEP.  Osteoporosis is a disease in which the bones lose minerals and strength   with aging. This can result in serious bone fractures or breaks. The risk of osteoporosis can be identified using a bone density scan. Women ages 65 years and over and women at risk for fractures or osteoporosis should discuss screening with their health care providers. Ask your health care provider whether you should take a calcium supplement or vitamin D to reduce the rate of osteoporosis.  Menopause can be associated with physical symptoms and risks. Hormone replacement therapy is available to decrease symptoms and risks. You should talk to your health care provider about whether hormone replacement therapy is right for you.  Use sunscreen. Apply sunscreen liberally and repeatedly throughout the day. You should seek shade when your shadow is shorter than you. Protect yourself by wearing long sleeves, pants, a wide-brimmed hat, and sunglasses year round, whenever you are outdoors.  Once a month, do a whole body skin exam, using a mirror to look at the skin on your back. Tell your health care provider of new moles, moles that have irregular borders, moles that are larger than a pencil eraser, or moles that have changed in shape or color.  Stay current with required vaccines (immunizations).  Influenza vaccine. All adults should be immunized every year.  Tetanus, diphtheria, and acellular pertussis (Td, Tdap) vaccine. Pregnant women should  receive 1 dose of Tdap vaccine during each pregnancy. The dose should be obtained regardless of the length of time since the last dose. Immunization is preferred during the 27th-36th week of gestation. An adult who has not previously received Tdap or who does not know her vaccine status should receive 1 dose of Tdap. This initial dose should be followed by tetanus and diphtheria toxoids (Td) booster doses every 10 years. Adults with an unknown or incomplete history of completing a 3-dose immunization series with Td-containing vaccines should begin or complete a primary immunization series including a Tdap dose. Adults should receive a Td booster every 10 years.  Varicella vaccine. An adult without evidence of immunity to varicella should receive 2 doses or a second dose if she has previously received 1 dose. Pregnant females who do not have evidence of immunity should receive the first dose after pregnancy. This first dose should be obtained before leaving the health care facility. The second dose should be obtained 4-8 weeks after the first dose.  Human papillomavirus (HPV) vaccine. Females aged 13-26 years who have not received the vaccine previously should obtain the 3-dose series. The vaccine is not recommended for use in pregnant females. However, pregnancy testing is not needed before receiving a dose. If a female is found to be pregnant after receiving a dose, no treatment is needed. In that case, the remaining doses should be delayed until after the pregnancy. Immunization is recommended for any person with an immunocompromised condition through the age of 26 years if she did not get any or all doses earlier. During the 3-dose series, the second dose should be obtained 4-8 weeks after the first dose. The third dose should be obtained 24 weeks after the first dose and 16 weeks after the second dose.  Zoster vaccine. One dose is recommended for adults aged 60 years or older unless certain conditions are  present.  Measles, mumps, and rubella (MMR) vaccine. Adults born before 1957 generally are considered immune to measles and mumps. Adults born in 1957 or later should have 1 or more doses of MMR vaccine unless there is a contraindication to the vaccine or there is laboratory evidence of immunity to   each of the three diseases. A routine second dose of MMR vaccine should be obtained at least 28 days after the first dose for students attending postsecondary schools, health care workers, or international travelers. People who received inactivated measles vaccine or an unknown type of measles vaccine during 1963-1967 should receive 2 doses of MMR vaccine. People who received inactivated mumps vaccine or an unknown type of mumps vaccine before 1979 and are at high risk for mumps infection should consider immunization with 2 doses of MMR vaccine. For females of childbearing age, rubella immunity should be determined. If there is no evidence of immunity, females who are not pregnant should be vaccinated. If there is no evidence of immunity, females who are pregnant should delay immunization until after pregnancy. Unvaccinated health care workers born before [birth year] who lack laboratory evidence of measles, mumps, or rubella immunity or laboratory confirmation of disease should consider measles and mumps immunization with 2 doses of MMR vaccine or rubella immunization with 1 dose of MMR vaccine.  Pneumococcal 13-valent conjugate (PCV13) vaccine. When indicated, a person who is uncertain of her immunization history and has no record of immunization should receive the PCV13 vaccine. An adult aged 43 years or older who has certain medical conditions and has not been previously immunized should receive 1 dose of PCV13 vaccine. This PCV13 should be followed with a dose of pneumococcal polysaccharide (PPSV23) vaccine. The PPSV23 vaccine dose should be obtained at least 8 weeks after the dose of PCV13 vaccine. An adult aged 69  years or older who has certain medical conditions and previously received 1 or more doses of PPSV23 vaccine should receive 1 dose of PCV13. The PCV13 vaccine dose should be obtained 1 or more years after the last PPSV23 vaccine dose.  Pneumococcal polysaccharide (PPSV23) vaccine. When PCV13 is also indicated, PCV13 should be obtained first. All adults aged 13 years and older should be immunized. An adult younger than age 53 years who has certain medical conditions should be immunized. Any person who resides in a nursing home or long-term care facility should be immunized. An adult smoker should be immunized. People with an immunocompromised condition and certain other conditions should receive both PCV13 and PPSV23 vaccines. People with human immunodeficiency virus (HIV) infection should be immunized as soon as possible after diagnosis. Immunization during chemotherapy or radiation therapy should be avoided. Routine use of PPSV23 vaccine is not recommended for American Indians, [place] Natives, or people younger than 65 years unless there are medical conditions that require PPSV23 vaccine. When indicated, people who have unknown immunization and have no record of immunization should receive PPSV23 vaccine. One-time revaccination 5 years after the first dose of PPSV23 is recommended for people aged 19-64 years who have chronic kidney failure, nephrotic syndrome, asplenia, or immunocompromised conditions. People who received 1-2 doses of PPSV23 before age 38 years should receive another dose of PPSV23 vaccine at age 29 years or later if at least 5 years have passed since the previous dose. Doses of PPSV23 are not needed for people immunized with PPSV23 at or after age 69 years.  Meningococcal vaccine. Adults with asplenia or persistent complement component deficiencies should receive 2 doses of quadrivalent meningococcal conjugate (MenACWY-D) vaccine. The doses should be obtained at least 2 months apart.  Microbiologists working with certain meningococcal bacteria, [place] recruits, people at risk during an outbreak, and people who travel to or live in countries with a high rate of meningitis should be immunized. A first-year college student up through age  21 years who is living in a residence hall should receive a dose if she did not receive a dose on or after her 16th birthday. Adults who have certain high-risk conditions should receive one or more doses of vaccine.  Hepatitis A vaccine. Adults who wish to be protected from this disease, have certain high-risk conditions, work with hepatitis A-infected animals, work in hepatitis A research labs, or travel to or work in countries with a high rate of hepatitis A should be immunized. Adults who were previously unvaccinated and who anticipate close contact with an international adoptee during the first 60 days after arrival in the [place] from a country with a high rate of hepatitis A should be immunized.  Hepatitis B vaccine. Adults who wish to be protected from this disease, have certain high-risk conditions, may be exposed to blood or other infectious body fluids, are household contacts or sex partners of hepatitis B positive people, are clients or workers in certain care facilities, or travel to or work in countries with a high rate of hepatitis B should be immunized.  Haemophilus influenzae type b (Hib) vaccine. A previously unvaccinated person with asplenia or sickle cell disease or having a scheduled splenectomy should receive 1 dose of Hib vaccine. Regardless of previous immunization, a recipient of a hematopoietic stem cell transplant should receive a 3-dose series 6-12 months after her successful transplant. Hib vaccine is not recommended for adults with HIV infection. Preventive Services / Frequency Ages 64 to 68 years  Blood pressure check.** / Every 1 to 2 years.  Lipid and cholesterol check.** / Every 5 years beginning at age  22.  Clinical breast exam.** / Every 3 years for women in their 88s and 53s.  BRCA-related cancer risk assessment.** / For women who have family members with a BRCA-related cancer (breast, ovarian, tubal, or peritoneal cancers).  Pap test.** / Every 2 years from ages 90 through 51. Every 3 years starting at age 21 through age 56 or 3 with a history of 3 consecutive normal Pap tests.  HPV screening.** / Every 3 years from ages 24 through ages 1 to 46 with a history of 3 consecutive normal Pap tests.  Hepatitis C blood test.** / For any individual with known risks for hepatitis C.  Skin self-exam. / Monthly.  Influenza vaccine. / Every year.  Tetanus, diphtheria, and acellular pertussis (Tdap, Td) vaccine.** / Consult your health care provider. Pregnant women should receive 1 dose of Tdap vaccine during each pregnancy. 1 dose of Td every 10 years.  Varicella vaccine.** / Consult your health care provider. Pregnant females who do not have evidence of immunity should receive the first dose after pregnancy.  HPV vaccine. / 3 doses over 6 months, if 72 and younger. The vaccine is not recommended for use in pregnant females. However, pregnancy testing is not needed before receiving a dose.  Measles, mumps, rubella (MMR) vaccine.** / You need at least 1 dose of MMR if you were born in 1957 or later. You may also need a 2nd dose. For females of childbearing age, rubella immunity should be determined. If there is no evidence of immunity, females who are not pregnant should be vaccinated. If there is no evidence of immunity, females who are pregnant should delay immunization until after pregnancy.  Pneumococcal 13-valent conjugate (PCV13) vaccine.** / Consult your health care provider.  Pneumococcal polysaccharide (PPSV23) vaccine.** / 1 to 2 doses if you smoke cigarettes or if you have certain conditions.  Meningococcal vaccine.** /  1 dose if you are age 19 to 21 years and a first-year college  student living in a residence hall, or have one of several medical conditions, you need to get vaccinated against meningococcal disease. You may also need additional booster doses.  Hepatitis A vaccine.** / Consult your health care provider.  Hepatitis B vaccine.** / Consult your health care provider.  Haemophilus influenzae type b (Hib) vaccine.** / Consult your health care provider. Ages 40 to 64 years  Blood pressure check.** / Every 1 to 2 years.  Lipid and cholesterol check.** / Every 5 years beginning at age 20 years.  Lung cancer screening. / Every year if you are aged 55-80 years and have a 30-pack-year history of smoking and currently smoke or have quit within the past 15 years. Yearly screening is stopped once you have quit smoking for at least 15 years or develop a health problem that would prevent you from having lung cancer treatment.  Clinical breast exam.** / Every year after age 40 years.  BRCA-related cancer risk assessment.** / For women who have family members with a BRCA-related cancer (breast, ovarian, tubal, or peritoneal cancers).  Mammogram.** / Every year beginning at age 40 years and continuing for as long as you are in good health. Consult with your health care provider.  Pap test.** / Every 3 years starting at age 30 years through age 65 or 70 years with a history of 3 consecutive normal Pap tests.  HPV screening.** / Every 3 years from ages 30 years through ages 65 to 70 years with a history of 3 consecutive normal Pap tests.  Fecal occult blood test (FOBT) of stool. / Every year beginning at age 50 years and continuing until age 75 years. You may not need to do this test if you get a colonoscopy every 10 years.  Flexible sigmoidoscopy or colonoscopy.** / Every 5 years for a flexible sigmoidoscopy or every 10 years for a colonoscopy beginning at age 50 years and continuing until age 75 years.  Hepatitis C blood test.** / For all people born from 1945 through  1965 and any individual with known risks for hepatitis C.  Skin self-exam. / Monthly.  Influenza vaccine. / Every year.  Tetanus, diphtheria, and acellular pertussis (Tdap/Td) vaccine.** / Consult your health care provider. Pregnant women should receive 1 dose of Tdap vaccine during each pregnancy. 1 dose of Td every 10 years.  Varicella vaccine.** / Consult your health care provider. Pregnant females who do not have evidence of immunity should receive the first dose after pregnancy.  Zoster vaccine.** / 1 dose for adults aged 60 years or older.  Measles, mumps, rubella (MMR) vaccine.** / You need at least 1 dose of MMR if you were born in 1957 or later. You may also need a 2nd dose. For females of childbearing age, rubella immunity should be determined. If there is no evidence of immunity, females who are not pregnant should be vaccinated. If there is no evidence of immunity, females who are pregnant should delay immunization until after pregnancy.  Pneumococcal 13-valent conjugate (PCV13) vaccine.** / Consult your health care provider.  Pneumococcal polysaccharide (PPSV23) vaccine.** / 1 to 2 doses if you smoke cigarettes or if you have certain conditions.  Meningococcal vaccine.** / Consult your health care provider.  Hepatitis A vaccine.** / Consult your health care provider.  Hepatitis B vaccine.** / Consult your health care provider.  Haemophilus influenzae type b (Hib) vaccine.** / Consult your health care provider. Ages 65   years and over  Blood pressure check.** / Every 1 to 2 years.  Lipid and cholesterol check.** / Every 5 years beginning at age 22 years.  Lung cancer screening. / Every year if you are aged 73-80 years and have a 30-pack-year history of smoking and currently smoke or have quit within the past 15 years. Yearly screening is stopped once you have quit smoking for at least 15 years or develop a health problem that would prevent you from having lung cancer  treatment.  Clinical breast exam.** / Every year after age 4 years.  BRCA-related cancer risk assessment.** / For women who have family members with a BRCA-related cancer (breast, ovarian, tubal, or peritoneal cancers).  Mammogram.** / Every year beginning at age 40 years and continuing for as long as you are in good health. Consult with your health care provider.  Pap test.** / Every 3 years starting at age 9 years through age 34 or 91 years with 3 consecutive normal Pap tests. Testing can be stopped between 65 and 70 years with 3 consecutive normal Pap tests and no abnormal Pap or HPV tests in the past 10 years.  HPV screening.** / Every 3 years from ages 57 years through ages 64 or 45 years with a history of 3 consecutive normal Pap tests. Testing can be stopped between 65 and 70 years with 3 consecutive normal Pap tests and no abnormal Pap or HPV tests in the past 10 years.  Fecal occult blood test (FOBT) of stool. / Every year beginning at age 15 years and continuing until age 17 years. You may not need to do this test if you get a colonoscopy every 10 years.  Flexible sigmoidoscopy or colonoscopy.** / Every 5 years for a flexible sigmoidoscopy or every 10 years for a colonoscopy beginning at age 86 years and continuing until age 71 years.  Hepatitis C blood test.** / For all people born from [birth year] through 1964 and any individual with known risks for hepatitis C.  Osteoporosis screening.** / A one-time screening for women ages 83 years and over and women at risk for fractures or osteoporosis.  Skin self-exam. / Monthly.  Influenza vaccine. / Every year.  Tetanus, diphtheria, and acellular pertussis (Tdap/Td) vaccine.** / 1 dose of Td every 10 years.  Varicella vaccine.** / Consult your health care provider.  Zoster vaccine.** / 1 dose for adults aged 61 years or older.  Pneumococcal 13-valent conjugate (PCV13) vaccine.** / Consult your health care provider.  Pneumococcal  polysaccharide (PPSV23) vaccine.** / 1 dose for all adults aged 28 years and older.  Meningococcal vaccine.** / Consult your health care provider.  Hepatitis A vaccine.** / Consult your health care provider.  Hepatitis B vaccine.** / Consult your health care provider.  Haemophilus influenzae type b (Hib) vaccine.** / Consult your health care provider. ** Family history and personal history of risk and conditions may change your health care provider's recommendations. Document Released: 02/03/2002 Document Revised: 04/24/2014 Document Reviewed: 05/05/2011 [facility] Patient Information 2015 [place], [place]. This information is not intended to replace advice given to you by your health care provider. Make sure you discuss any questions you have with your health care provider.

## 2014-09-05 NOTE — Addendum Note (Signed)
Addended by: Modena Morrow D on: 09/05/2014 01:01 PM   Modules accepted: Orders

## 2014-09-05 NOTE — Progress Notes (Signed)
Pre visit review using our clinic review tool, if applicable. No additional management support is needed unless otherwise documented below in the visit note. 

## 2014-09-06 ENCOUNTER — Encounter: Payer: Medicare Other | Admitting: Family Medicine

## 2014-09-06 LAB — CBC WITH DIFFERENTIAL/PLATELET
Basophils Absolute: 0 10*3/uL (ref 0.0–0.1)
Basophils Relative: 0.3 % (ref 0.0–3.0)
EOS PCT: 1.7 % (ref 0.0–5.0)
Eosinophils Absolute: 0.1 10*3/uL (ref 0.0–0.7)
HEMATOCRIT: 39.5 % (ref 36.0–46.0)
Hemoglobin: 13.3 g/dL (ref 12.0–15.0)
LYMPHS ABS: 1.8 10*3/uL (ref 0.7–4.0)
Lymphocytes Relative: 26.6 % (ref 12.0–46.0)
MCHC: 33.7 g/dL (ref 30.0–36.0)
MCV: 91.1 fl (ref 78.0–100.0)
MONO ABS: 0.2 10*3/uL (ref 0.1–1.0)
Monocytes Relative: 2.7 % — ABNORMAL LOW (ref 3.0–12.0)
NEUTROS ABS: 4.8 10*3/uL (ref 1.4–7.7)
Neutrophils Relative %: 68.7 % (ref 43.0–77.0)
PLATELETS: 235 10*3/uL (ref 150.0–400.0)
RBC: 4.34 Mil/uL (ref 3.87–5.11)
RDW: 13.1 % (ref 11.5–15.5)
WBC: 6.9 10*3/uL (ref 4.0–10.5)

## 2014-09-06 LAB — MICROALBUMIN / CREATININE URINE RATIO
CREATININE, U: 257 mg/dL
MICROALB UR: 1.7 mg/dL (ref 0.0–1.9)
Microalb Creat Ratio: 0.7 mg/g (ref 0.0–30.0)

## 2014-09-07 LAB — URINE CULTURE
Colony Count: NO GROWTH
Organism ID, Bacteria: NO GROWTH

## 2014-09-21 ENCOUNTER — Other Ambulatory Visit: Payer: Self-pay | Admitting: Cardiovascular Disease

## 2014-10-04 ENCOUNTER — Ambulatory Visit
Admission: RE | Admit: 2014-10-04 | Discharge: 2014-10-04 | Disposition: A | Discharge status: Home / Self Care | Payer: Medicare Other | Source: Ambulatory Visit | Attending: Family Medicine | Admitting: Family Medicine

## 2014-10-04 DIAGNOSIS — Z1231 Encounter for screening mammogram for malignant neoplasm of breast: Secondary | ICD-10-CM | POA: Diagnosis not present

## 2014-10-04 DIAGNOSIS — Z780 Asymptomatic menopausal state: Secondary | ICD-10-CM | POA: Diagnosis not present

## 2014-10-04 DIAGNOSIS — E2839 Other primary ovarian failure: Secondary | ICD-10-CM

## 2014-10-04 DIAGNOSIS — Z13820 Encounter for screening for osteoporosis: Secondary | ICD-10-CM | POA: Diagnosis not present

## 2014-10-05 ENCOUNTER — Encounter: Payer: Self-pay | Admitting: General Practice

## 2014-10-18 ENCOUNTER — Encounter: Payer: Self-pay | Admitting: Family Medicine

## 2014-11-05 ENCOUNTER — Other Ambulatory Visit: Payer: Self-pay | Admitting: Cardiovascular Disease

## 2014-11-20 ENCOUNTER — Ambulatory Visit: Payer: Medicare Other | Admitting: Cardiovascular Disease

## 2014-11-22 ENCOUNTER — Encounter: Payer: Self-pay | Admitting: Cardiovascular Disease

## 2014-11-22 ENCOUNTER — Ambulatory Visit (INDEPENDENT_AMBULATORY_CARE_PROVIDER_SITE_OTHER): Payer: Medicare Other | Admitting: Cardiovascular Disease

## 2014-11-22 VITALS — BP 130/70 | HR 72 | Ht 63.0 in | Wt 142.0 lb

## 2014-11-22 DIAGNOSIS — E785 Hyperlipidemia, unspecified: Secondary | ICD-10-CM

## 2014-11-22 DIAGNOSIS — I10 Essential (primary) hypertension: Secondary | ICD-10-CM

## 2014-11-22 DIAGNOSIS — Q254 Other congenital malformations of aorta: Secondary | ICD-10-CM

## 2014-11-22 DIAGNOSIS — E119 Type 2 diabetes mellitus without complications: Secondary | ICD-10-CM | POA: Diagnosis not present

## 2014-11-22 DIAGNOSIS — Q2549 Other congenital malformations of aorta: Secondary | ICD-10-CM

## 2014-11-22 NOTE — Assessment & Plan Note (Signed)
Cholesterol is at goal.  Continue current dose of statin and diet Rx.  No myalgias or side effects.  F/U  LFT's in 6 months. Lab Results  Component Value Date   LDLCALC 38 09/05/2014

## 2014-11-22 NOTE — Assessment & Plan Note (Signed)
Well controlled.  Continue current medications and low sodium Dash type diet.    

## 2014-11-22 NOTE — Assessment & Plan Note (Signed)
A1c 6.8  She is doing better and has much better diet with low carb

## 2014-11-22 NOTE — Progress Notes (Signed)
[name], female   DOB: [birth year], 68 y.o.   MRN: [id number] Cynthia Burns returns today for followup. 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 Unfortunately she continues to be significant he overweight. We talked about this at length. He needs to assume a low carbohydrate diet. Lot of her social activities seem to revolve around food. She does have a treadmill at home but is sedentary. I talked to her about an exercise prescription including a heart rate of 130 beats per minute as the target. She is on diastolic but I think 202 beats per minute his achievable given her current deconditioned state. 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 The non-coronary sinus of Valsalva measure 2.5 cm vertically along the axis of the aorta or an approximately 1.2 cm transvers She has no significant aortic stenosis or insufficiency and her valve is trileaflet. Last echo in January of 09 showed no significant progression.  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  All other systems reviewed and negative  General: Affect appropriate Healthy:  appears stated age 25: normal Neck supple with no adenopathy JVP  normal no bruits no thyromegaly Lungs clear with no wheezing and good diaphragmatic motion Heart:  S1/S2 no murmur, no rub, gallop or click PMI normal Abdomen: benighn, BS positve, no tenderness, no AAA no bruit.  No HSM or HJR Distal pulses intact with no bruits No edema Neuro non-focal Skin warm and dry No muscular weakness   Current Outpatient Prescriptions  Medication Sig Dispense Refill  . aspirin EC 81 MG tablet Take 1 tablet (81 mg total) by mouth daily. 90 tablet 3  . Biotin (PA BIOTIN) 1000 MCG tablet Take 1,000 mcg by mouth daily.    . Cyanocobalamin (VITAMIN B-12 PO) Take 2 tablets by mouth once a week.    . furosemide (LASIX) 20 MG tablet TAKE ONE-HALF (1/2) TABLET (10 MG) DAILY 45 tablet 0  . glucose blood (ONE TOUCH ULTRA TEST) test strip Check Blood sugar daily 100 each 12  . losartan (COZAAR) 25 MG tablet Take 1 tablet (25 mg total) by mouth daily. 30 tablet 1  . metFORMIN (GLUCOPHAGE-XR) 500 MG 24 hr tablet TAKE 2 TABLETS IN THE MORNING AND 2 TABLETS IN THE EVENING 360 tablet 1  . Multiple Vitamins-Minerals (PRESERVISION AREDS 2 PO) Take 2 capsules by mouth once a week.    . nebivolol (BYSTOLIC) 5 MG tablet Take 1 tablet (5 mg total) by mouth daily. 90 tablet 4  . ONETOUCH DELICA LANCETS 54Y MISC Check blood sugar daily 100 each 12  . rosuvastatin (CRESTOR) 20 MG tablet Take 1 tablet (20 mg total) by mouth daily. 90 tablet 3  .  VITAMIN D, CHOLECALCIFEROL, PO Take 2 tablets by mouth once a week.    . meclizine (ANTIVERT) 25 MG tablet Take 25 mg by mouth as needed for dizziness.     No current facility-administered medications for this visit.    Allergies  Codeine; Mucinex; Advicor; Amlodipine; Lisinopril-hydrochlorothiazide; Other; Ramipril; and Sulfonamide derivatives  Electrocardiogram:  SR rate 72  Low voltage no change from 2014   Assessment and Plan

## 2014-11-22 NOTE — Assessment & Plan Note (Signed)
No associated rott aneurysm or AR  Stable over multiple years with CT/MRI  Consider re imaging 2017

## 2014-11-22 NOTE — Patient Instructions (Signed)
Your physician wants you to follow-up in: YEAR WITH DR NISHAN  You will receive a reminder letter in the mail two months in advance. If you don't receive a letter, please call our office to schedule the follow-up appointment.  Your physician recommends that you continue on your current medications as directed. Please refer to the Current Medication list given to you today. 

## 2014-12-15 ENCOUNTER — Other Ambulatory Visit: Payer: Self-pay | Admitting: Cardiovascular Disease

## 2015-02-03 ENCOUNTER — Other Ambulatory Visit: Payer: Self-pay | Admitting: Family Medicine

## 2015-02-03 ENCOUNTER — Other Ambulatory Visit: Payer: Self-pay | Admitting: Cardiovascular Disease

## 2015-02-20 ENCOUNTER — Other Ambulatory Visit: Payer: Self-pay

## 2015-02-20 MED ORDER — GLUCOSE BLOOD VI STRP: ORAL_STRIP | Status: DC

## 2015-02-22 ENCOUNTER — Other Ambulatory Visit: Payer: Self-pay | Admitting: Family Medicine

## 2015-03-13 ENCOUNTER — Ambulatory Visit: Payer: Medicare Other | Admitting: Family Medicine

## 2015-03-15 ENCOUNTER — Encounter: Payer: Self-pay | Admitting: Family Medicine

## 2015-03-15 ENCOUNTER — Ambulatory Visit (INDEPENDENT_AMBULATORY_CARE_PROVIDER_SITE_OTHER): Payer: Medicare Other | Admitting: Family Medicine

## 2015-03-15 VITALS — BP 132/78 | HR 57 | Temp 97.7°F | Wt 139.8 lb

## 2015-03-15 DIAGNOSIS — E1165 Type 2 diabetes mellitus with hyperglycemia: Secondary | ICD-10-CM | POA: Diagnosis not present

## 2015-03-15 DIAGNOSIS — E785 Hyperlipidemia, unspecified: Secondary | ICD-10-CM

## 2015-03-15 DIAGNOSIS — B070 Plantar wart: Secondary | ICD-10-CM | POA: Diagnosis not present

## 2015-03-15 DIAGNOSIS — I10 Essential (primary) hypertension: Secondary | ICD-10-CM

## 2015-03-15 DIAGNOSIS — R8299 Other abnormal findings in urine: Secondary | ICD-10-CM | POA: Diagnosis not present

## 2015-03-15 DIAGNOSIS — IMO0002 Reserved for concepts with insufficient information to code with codable children: Secondary | ICD-10-CM

## 2015-03-15 DIAGNOSIS — R8290 Unspecified abnormal findings in urine: Secondary | ICD-10-CM

## 2015-03-15 LAB — LIPID PANEL
CHOL/HDL RATIO: 3
Cholesterol: 108 mg/dL (ref 0–200)
HDL: 41.6 mg/dL (ref >39.00)
LDL Cholesterol: 45 mg/dL (ref 0–99)
NonHDL: 66.4
TRIGLYCERIDES: 109 mg/dL (ref 0.0–149.0)
VLDL: 21.8 mg/dL (ref 0.0–40.0)

## 2015-03-15 LAB — BASIC METABOLIC PANEL
BUN: 17 mg/dL (ref 6–23)
CALCIUM: 9.8 mg/dL (ref 8.4–10.5)
CO2: 30 mEq/L (ref 19–32)
CREATININE: 0.71 mg/dL (ref 0.40–1.20)
Chloride: 103 mEq/L (ref 96–112)
GFR: 87.21 mL/min (ref >60.00)
Glucose, Bld: 125 mg/dL — ABNORMAL HIGH (ref 70–99)
Potassium: 3.9 mEq/L (ref 3.5–5.1)
Sodium: 139 mEq/L (ref 135–145)

## 2015-03-15 LAB — HEPATIC FUNCTION PANEL
ALT: 14 U/L (ref 0–35)
AST: 18 U/L (ref 0–37)
Albumin: 4.6 g/dL (ref 3.5–5.2)
Alkaline Phosphatase: 47 U/L (ref 39–117)
BILIRUBIN DIRECT: 0.1 mg/dL (ref 0.0–0.3)
TOTAL PROTEIN: 7.7 g/dL (ref 6.0–8.3)
Total Bilirubin: 0.5 mg/dL (ref 0.2–1.2)

## 2015-03-15 LAB — POCT URINALYSIS DIPSTICK
Bilirubin, UA: NEGATIVE
GLUCOSE UA: NEGATIVE
Ketones, UA: NEGATIVE
NITRITE UA: NEGATIVE
Protein, UA: NEGATIVE
SPEC GRAV UA: 1.025
UROBILINOGEN UA: 0.2
pH, UA: 6

## 2015-03-15 LAB — MICROALBUMIN / CREATININE URINE RATIO
Creatinine,U: 141.8 mg/dL
MICROALB/CREAT RATIO: 1.6 mg/g (ref 0.0–30.0)
Microalb, Ur: 2.2 mg/dL — ABNORMAL HIGH (ref 0.0–1.9)

## 2015-03-15 LAB — HEMOGLOBIN A1C: Hgb A1c MFr Bld: 6.9 % — ABNORMAL HIGH (ref 4.6–6.5)

## 2015-03-15 MED ORDER — MECLIZINE HCL 25 MG PO TABS: 25.0000 mg | ORAL_TABLET | ORAL | Status: DC | PRN

## 2015-03-15 NOTE — Addendum Note (Signed)
Addended by: Peggyann Shoals on: 03/15/2015 10:43 AM   Modules accepted: Orders

## 2015-03-15 NOTE — Progress Notes (Signed)
[name], female    DOB: Oct 14, 1946  Age: 69 y.o. MRN: [id number]    Subjective:  Subjective HPI Cynthia Burns presents for f/u dm, htn, hyperlipidemia.  She is also c/o plantar wart on R foot.   HPI HYPERTENSION  Blood pressure range-not checking  Chest pain- no      Dyspnea- no Lightheadedness- no   Edema- no Other side effects - no   Medication compliance: good Low salt diet- yes  DIABETES  Blood Sugar ranges-120s-126  Polyuria- no New Visual problems- no Hypoglycemic symptoms- no Other side effects-no Medication compliance - good Last eye exam- 04/2014 Foot exam- today  HYPERLIPIDEMIA  Medication compliance- good RUQ pain- no  Muscle aches- no Other side effects-no   Review of Systems  Constitutional: Negative for diaphoresis, activity change, appetite change, fatigue and unexpected weight change.  Eyes: Negative for pain, redness and visual disturbance.  Respiratory: Negative for cough, chest tightness, shortness of breath and wheezing.   Cardiovascular: Negative for chest pain, palpitations and leg swelling.  Endocrine: Negative for cold intolerance, heat intolerance, polydipsia, polyphagia and polyuria.  Genitourinary: Negative for dysuria, frequency and difficulty urinating.  Neurological: Negative for dizziness, light-headedness, numbness and headaches.  Psychiatric/Behavioral: Negative for behavioral problems and dysphoric mood. The patient is not nervous/anxious.     History Past Medical History  Diagnosis Date  . Overweight(278.02)   . HYPERTENSION   . MITRAL VALVE PROLAPSE   . PHARYNGITIS, ACUTE   . OSTEOPENIA   . Diabetes mellitus without complication   . 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   Her family history includes Diabetes in her father; Emphysema in her father; Heart disease in her mother; Hypertension in an other family member. There is no history  of Colon cancer.She reports that she has never smoked. She has never used smokeless tobacco. She reports that she does not drink alcohol or use illicit drugs.  Current Outpatient Prescriptions on File Prior to Visit  Medication Sig Dispense Refill  . aspirin EC 81 MG tablet Take 1 tablet (81 mg total) by mouth daily. 90 tablet 3  . Biotin (PA BIOTIN) 1000 MCG tablet Take 1,000 mcg by mouth daily.    Marland Kitchen BYSTOLIC 5 MG tablet TAKE 1 TABLET DAILY 90 tablet 1  . CRESTOR 20 MG tablet TAKE 1 TABLET DAILY 90 tablet 1  . Cyanocobalamin (VITAMIN B-12 PO) Take 1 tablet by mouth daily.     . furosemide (LASIX) 20 MG tablet TAKE ONE-HALF (1/2) TABLET (10 MG) DAILY 45 tablet 6  . glucose blood (ONE TOUCH ULTRA TEST) test strip Check blood sugar daily E11.9 100 each 7  . losartan (COZAAR) 25 MG tablet TAKE 1 TABLET DAILY 90 tablet 1  . metFORMIN (GLUCOPHAGE-XR) 500 MG 24 hr tablet TAKE 2 TABLETS IN THE MORNING AND TAKE 2 TABLETS IN THE EVENING. 360 tablet 0  . ONETOUCH DELICA LANCETS 79X MISC Check blood sugar daily 100 each 12  . VITAMIN D, CHOLECALCIFEROL, PO Take 1 tablet by mouth daily.      No current facility-administered medications on file prior to visit.     Objective:  Objective Physical Exam  Constitutional: She is oriented to person, place, and time. She appears well-developed and well-nourished.  HENT:  Head: Normocephalic and atraumatic.  Eyes: Conjunctivae and EOM are normal.  Neck: Normal range of motion. Neck supple. No JVD present. Carotid  bruit is not present. No thyromegaly present.  Cardiovascular: Normal rate, regular rhythm and normal heart sounds.   No murmur heard. Pulmonary/Chest: Effort normal and breath sounds normal. No respiratory distress. She has no wheezes. She has no rales. She exhibits no tenderness.  Musculoskeletal: She exhibits no edema.  Neurological: She is alert and oriented to person, place, and time.  Psychiatric: She has a normal mood and affect.  Sensory  exam of the foot is normal, tested with the monofilament. Good pulses, no lesions or ulcers, good peripheral pulses. r foot-- + plantar wart   BP 132/78 mmHg  Pulse 57  Temp(Src) 97.7 F (36.5 C) (Oral)  Wt 139 lb 12.8 oz (63.413 kg)  SpO2 97% Wt Readings from Last 3 Encounters:  03/15/15 139 lb 12.8 oz (63.413 kg)  11/22/14 142 lb (64.411 kg)  09/05/14 140 lb 3.4 oz (63.6 kg)     Lab Results  Component Value Date   WBC 6.9 09/05/2014   HGB 13.3 09/05/2014   HCT 39.5 09/05/2014   PLT 235.0 09/05/2014   GLUCOSE 111* 09/05/2014   CHOL 95 09/05/2014   TRIG 98.0 09/05/2014   HDL 37.50* 09/05/2014   LDLDIRECT 78.3 07/12/2013   LDLCALC 38 09/05/2014   ALT 15 09/05/2014   AST 18 09/05/2014   NA 140 09/05/2014   K 3.8 09/05/2014   CL 102 09/05/2014   CREATININE 0.8 09/05/2014   BUN 17 09/05/2014   CO2 28 09/05/2014   HGBA1C 6.8* 09/05/2014   MICROALBUR 1.7 09/05/2014    Dg Bone Density  10/04/2014   CLINICAL DATA:  Postmenopausal.  EXAM: DUAL X-RAY ABSORPTIOMETRY (DXA) FOR BONE MINERAL DENSITY  FINDINGS: AP LUMBAR SPINE (L1-L4)  Bone Mineral Density (BMD):  0.976 g/cm2  Young Adult T-Score:  -0.6  Z-Score:  1.2  LEFT FEMUR NECK  Bone Mineral Density (BMD):  0.801 g/cm2  Young Adult T-Score: -0.4  Z-Score:  1.2  ASSESSMENT: Patient's diagnostic category is NORMAL by WHO Criteria.  FRACTURE RISK: NOT INCREASED  FRAX: World Health Organization FRAX assessment of absolute fracture risk is not calculated for this patient because the patient has a normal study (all T-scores at or above -1.0).  COMPARISON: The bone mineral density of the lumbar spine has increased by 15.2% and the bone mineral density of the left hip has increased by 7.2% compared to the baseline study dated 10/27/2001. Please see the mailed report for additional comparisons.  Effective therapies are available in the form of bisphosphonates, selective estrogen receptor modulators, biologic agents, and hormone replacement  therapy (for women). All patients should ensure an adequate intake of dietary calcium (1200 mg daily) and vitamin D (800 IU daily) unless contraindicated.  All treatment decisions require clinical judgment and consideration of individual patient factors, including patient preferences, co-morbidities, previous drug use, risk factors not captured in the FRAX model (e.g., frailty, falls, vitamin D deficiency, increased bone turnover, interval significant decline in bone density) and possible under- or over-estimation of fracture risk by FRAX.  The National Osteoporosis Foundation recommends that FDA-approved medical therapies be considered in postmenopausal women and men age 35 or older with a:  1. Hip or vertebral (clinical or morphometric) fracture.  2. T-score of -2.5 or lower at the spine or hip.  3. Ten-year fracture probability by FRAX of 3% or greater for hip fracture or 20% or greater for major osteoporotic fracture.  People with diagnosed cases of osteoporosis or at high risk for fracture should have regular bone mineral density  tests. For patients eligible for Medicare, routine testing is allowed once every 2 years. The testing frequency can be increased to one year for patients who have rapidly progressing disease, those who are receiving or discontinuing medical therapy to restore bone mass, or have additional risk factors.  World Pharmacologist [facility]) Criteria:  Normal: T-scores from +1.0 to -1.0  Low Bone Mass (Osteopenia): T-scores between -1.0 and -2.5  Osteoporosis: T-scores -2.5 and below  Comparison to Reference Population:  T-score is the key measure used in the diagnosis of osteoporosis and relative risk determination for fracture. It provides a value for bone mass relative to the mean bone mass of a young adult reference population expressed in terms of standard deviation (SD).  Z-score is the age-matched score showing the patient's values compared to a population matched for age, sex, and  race. This is also expressed in terms of standard deviation. The patient may have values that compare favorably to the age-matched values and still be at increased risk for fracture.   Electronically Signed   By: Lillia Mountain M.D.   On: 10/04/2014 11:56   Mm Screening Breast Tomo Bilateral  10/05/2014   CLINICAL DATA:  Screening.  EXAM: DIGITAL SCREENING BILATERAL MAMMOGRAM WITH 3D TOMO WITH CAD  COMPARISON:  Previous exam(s).  ACR Breast Density Category b: There are scattered areas of fibroglandular density.  FINDINGS: There are no findings suspicious for malignancy. Images were processed with CAD.  IMPRESSION: No mammographic evidence of malignancy. A result letter of this screening mammogram will be mailed directly to the patient.  RECOMMENDATION: Screening mammogram in one year. (Code:[id number]-01Y)  BI-RADS CATEGORY  1: Negative.   Electronically Signed   By: Abelardo Diesel M.D.   On: 10/05/2014 06:50     Assessment & Plan:  Plan I have discontinued Ms. Manygoats's Multiple Vitamins-Minerals (PRESERVISION AREDS 2 PO). I have also changed her meclizine. Additionally, I am having her maintain her aspirin EC, Cyanocobalamin (VITAMIN B-12 PO), (VITAMIN D, CHOLECALCIFEROL, PO), ONETOUCH DELICA LANCETS 33H, Biotin, BYSTOLIC, CRESTOR, losartan, furosemide, metFORMIN, and glucose blood.  Meds ordered this encounter  Medications  . meclizine (ANTIVERT) 25 MG tablet    Sig: Take 1 tablet (25 mg total) by mouth as needed for dizziness.    Dispense:  30 tablet    Refill:  5    Problem List Items Addressed This Visit    Essential hypertension   Relevant Orders   Basic metabolic panel   Microalbumin / creatinine urine ratio   POCT urinalysis dipstick   Plantar wart of right foot - Primary    Refer to podiatry      Relevant Orders   Ambulatory referral to Podiatry    Other Visit Diagnoses    Diabetes mellitus type II, uncontrolled        Relevant Orders    Basic metabolic panel    Hemoglobin A1c     Microalbumin / creatinine urine ratio    POCT urinalysis dipstick    Hyperlipidemia        Relevant Orders    Hepatic function panel    Lipid panel       Follow-up: Return in about 6 months (around 09/15/2015), or if symptoms worsen or fail to improve.  Garnet Koyanagi, DO

## 2015-03-15 NOTE — Assessment & Plan Note (Signed)
Refer to podiatry

## 2015-03-15 NOTE — Patient Instructions (Signed)
Diabetes and Standards of Medical Care Diabetes is complicated. You may find that your diabetes team includes a dietitian, nurse, diabetes educator, eye doctor, and more. To help everyone know what is going on and to help you get the care you deserve, the following schedule of care was developed to help keep you on track. Below are the tests, exams, vaccines, medicines, education, and plans you will need. HbA1c test This test shows how well you have controlled your glucose over the past 2-3 months. It is used to see if your diabetes management plan needs to be adjusted.   It is performed at least 2 times a year if you are meeting treatment goals.  It is performed 4 times a year if therapy has changed or if you are not meeting treatment goals. Blood pressure test  This test is performed at every routine medical visit. The goal is less than 140/90 mm Hg for most people, but 130/80 mm Hg in some cases. Ask your health care provider about your goal. Dental exam  Follow up with the dentist regularly. Eye exam  If you are diagnosed with type 1 diabetes as a child, get an exam upon reaching the age of 37 years or older and have had diabetes for 3-5 years. Yearly eye exams are recommended after that initial eye exam.  If you are diagnosed with type 1 diabetes as an adult, get an exam within 5 years of diagnosis and then yearly.  If you are diagnosed with type 2 diabetes, get an exam as soon as possible after the diagnosis and then yearly. Foot care exam  Visual foot exams are performed at every routine medical visit. The exams check for cuts, injuries, or other problems with the feet.  A comprehensive foot exam should be done yearly. This includes visual inspection as well as assessing foot pulses and testing for loss of sensation.  Check your feet nightly for cuts, injuries, or other problems with your feet. Tell your health care provider if anything is not healing. Kidney function test (urine  microalbumin)  This test is performed once a year.  Type 1 diabetes: The first test is performed 5 years after diagnosis.  Type 2 diabetes: The first test is performed at the time of diagnosis.  A serum creatinine and estimated glomerular filtration rate (eGFR) test is done once a year to assess the level of chronic kidney disease (CKD), if present. Lipid profile (cholesterol, HDL, LDL, triglycerides)  Performed every 5 years for most people.  The goal for LDL is less than 100 mg/dL. If you are at high risk, the goal is less than 70 mg/dL.  The goal for HDL is 40 mg/dL-50 mg/dL for men and 50 mg/dL-60 mg/dL for women. An HDL cholesterol of 60 mg/dL or higher gives some protection against heart disease.  The goal for triglycerides is less than 150 mg/dL. Influenza vaccine, pneumococcal vaccine, and hepatitis B vaccine  The influenza vaccine is recommended yearly.  It is recommended that people with diabetes who are over 24 years old get the pneumonia vaccine. In some cases, two separate shots may be given. Ask your health care provider if your pneumonia vaccination is up to date.  The hepatitis B vaccine is also recommended for adults with diabetes. Diabetes self-management education  Education is recommended at diagnosis and ongoing as needed. Treatment plan  Your treatment plan is reviewed at every medical visit. Document Released: 10/05/2009 Document Revised: 04/24/2014 Document Reviewed: 05/10/2013 [facility] Patient Information 2015 [place],  [place]. This information is not intended to replace advice given to you by your health care provider. Make sure you discuss any questions you have with your health care provider.  

## 2015-03-15 NOTE — Progress Notes (Signed)
Pre visit review using our clinic review tool, if applicable. No additional management support is needed unless otherwise documented below in the visit note. 

## 2015-03-16 LAB — URINE CULTURE
Colony Count: NO GROWTH
ORGANISM ID, BACTERIA: NO GROWTH

## 2015-03-20 ENCOUNTER — Ambulatory Visit (INDEPENDENT_AMBULATORY_CARE_PROVIDER_SITE_OTHER): Payer: Medicare Other | Admitting: Podiatry

## 2015-03-20 ENCOUNTER — Encounter: Payer: Self-pay | Admitting: Podiatry

## 2015-03-20 VITALS — BP 142/73 | HR 67 | Ht 62.0 in | Wt 138.0 lb

## 2015-03-20 DIAGNOSIS — Q828 Other specified congenital malformations of skin: Secondary | ICD-10-CM

## 2015-03-20 DIAGNOSIS — B070 Plantar wart: Secondary | ICD-10-CM

## 2015-03-20 NOTE — Progress Notes (Signed)
Subjective: 69 year old female presents complaining of pain under the ball of right foot. Stated that she was on a trip. While she was in [place] walking a lot 6-12 hours in December 2015. Again in [place] in February 2016 started to have pain in ball of right foot.  Same pain she had 50 years ago, which was treated with acid patch. Patient believes she has a wart.  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 Been diabetic x 2 years.   Review of Systems - Negative except chief complaints.   Objective: Dermatologic: Porokeratotic lesion under 2nd MPJ area bilateral R>L without wart like characteristics. Neurovascular status are within normal. Orthopedic: No abnormal findings.  Assessment: Painful porokeratotic lesion possible foreign body induced.  Plan: Reviewed findings and debrided the lesion. Patient is to return as needed.

## 2015-03-20 NOTE — Patient Instructions (Signed)
Seen for pain in right foot. Noted of porokeratotic lesion and debrided. Return as needed.

## 2015-04-28 ENCOUNTER — Other Ambulatory Visit: Payer: Self-pay | Admitting: Family Medicine

## 2015-05-07 DIAGNOSIS — E119 Type 2 diabetes mellitus without complications: Secondary | ICD-10-CM | POA: Diagnosis not present

## 2015-05-07 DIAGNOSIS — H25813 Combined forms of age-related cataract, bilateral: Secondary | ICD-10-CM | POA: Diagnosis not present

## 2015-05-07 LAB — HM DIABETES EYE EXAM

## 2015-05-11 ENCOUNTER — Other Ambulatory Visit: Payer: Self-pay | Admitting: Family Medicine

## 2015-05-25 ENCOUNTER — Encounter: Payer: Self-pay | Admitting: Family Medicine

## 2015-05-28 DIAGNOSIS — L255 Unspecified contact dermatitis due to plants, except food: Secondary | ICD-10-CM | POA: Diagnosis not present

## 2015-06-11 ENCOUNTER — Other Ambulatory Visit: Payer: Self-pay | Admitting: Cardiovascular Disease

## 2015-07-16 DIAGNOSIS — M722 Plantar fascial fibromatosis: Secondary | ICD-10-CM | POA: Diagnosis not present

## 2015-09-06 ENCOUNTER — Other Ambulatory Visit: Payer: Self-pay

## 2015-09-06 DIAGNOSIS — Z1231 Encounter for screening mammogram for malignant neoplasm of breast: Secondary | ICD-10-CM

## 2015-09-09 ENCOUNTER — Other Ambulatory Visit: Payer: Self-pay | Admitting: Cardiovascular Disease

## 2015-09-12 ENCOUNTER — Telehealth: Payer: Self-pay

## 2015-09-12 NOTE — Telephone Encounter (Signed)
Pre visit call completed 

## 2015-09-13 ENCOUNTER — Encounter: Payer: Self-pay | Admitting: Family Medicine

## 2015-09-13 ENCOUNTER — Ambulatory Visit (INDEPENDENT_AMBULATORY_CARE_PROVIDER_SITE_OTHER): Payer: Medicare Other | Admitting: Family Medicine

## 2015-09-13 VITALS — BP 128/72 | HR 58 | Temp 97.9°F | Ht 62.0 in | Wt 137.0 lb

## 2015-09-13 DIAGNOSIS — N390 Urinary tract infection, site not specified: Secondary | ICD-10-CM | POA: Diagnosis not present

## 2015-09-13 DIAGNOSIS — E785 Hyperlipidemia, unspecified: Secondary | ICD-10-CM | POA: Diagnosis not present

## 2015-09-13 DIAGNOSIS — Z1159 Encounter for screening for other viral diseases: Secondary | ICD-10-CM | POA: Diagnosis not present

## 2015-09-13 DIAGNOSIS — E119 Type 2 diabetes mellitus without complications: Secondary | ICD-10-CM

## 2015-09-13 DIAGNOSIS — Z0000 Encounter for general adult medical examination without abnormal findings: Secondary | ICD-10-CM | POA: Diagnosis not present

## 2015-09-13 DIAGNOSIS — R319 Hematuria, unspecified: Secondary | ICD-10-CM | POA: Diagnosis not present

## 2015-09-13 DIAGNOSIS — R8290 Unspecified abnormal findings in urine: Secondary | ICD-10-CM | POA: Diagnosis not present

## 2015-09-13 DIAGNOSIS — R82998 Other abnormal findings in urine: Secondary | ICD-10-CM

## 2015-09-13 DIAGNOSIS — R8299 Other abnormal findings in urine: Secondary | ICD-10-CM | POA: Diagnosis not present

## 2015-09-13 DIAGNOSIS — I10 Essential (primary) hypertension: Secondary | ICD-10-CM

## 2015-09-13 DIAGNOSIS — Z23 Encounter for immunization: Secondary | ICD-10-CM | POA: Diagnosis not present

## 2015-09-13 LAB — LIPID PANEL
CHOL/HDL RATIO: 3
Cholesterol: 119 mg/dL (ref 0–200)
HDL: 47 mg/dL (ref >39.00)
LDL Cholesterol: 51 mg/dL (ref 0–99)
NONHDL: 72.44
Triglycerides: 109 mg/dL (ref 0.0–149.0)
VLDL: 21.8 mg/dL (ref 0.0–40.0)

## 2015-09-13 LAB — POCT URINALYSIS DIPSTICK
GLUCOSE UA: NEGATIVE
KETONES UA: NEGATIVE
NITRITE UA: NEGATIVE
PH UA: 5.5
Spec Grav, UA: >=1.03
Urobilinogen, UA: 0.2

## 2015-09-13 LAB — COMPREHENSIVE METABOLIC PANEL
ALT: 15 U/L (ref 0–35)
AST: 18 U/L (ref 0–37)
Albumin: 4.4 g/dL (ref 3.5–5.2)
Alkaline Phosphatase: 40 U/L (ref 39–117)
BUN: 15 mg/dL (ref 6–23)
CO2: 29 meq/L (ref 19–32)
Calcium: 9.5 mg/dL (ref 8.4–10.5)
Chloride: 104 mEq/L (ref 96–112)
Creatinine, Ser: 0.74 mg/dL (ref 0.40–1.20)
GFR: 83.02 mL/min (ref >60.00)
GLUCOSE: 113 mg/dL — AB (ref 70–99)
POTASSIUM: 3.6 meq/L (ref 3.5–5.1)
Sodium: 139 mEq/L (ref 135–145)
Total Bilirubin: 0.4 mg/dL (ref 0.2–1.2)
Total Protein: 7.3 g/dL (ref 6.0–8.3)

## 2015-09-13 LAB — CBC WITH DIFFERENTIAL/PLATELET
Basophils Absolute: 0 10*3/uL (ref 0.0–0.1)
Basophils Relative: 0.6 % (ref 0.0–3.0)
EOS PCT: 1.7 % (ref 0.0–5.0)
Eosinophils Absolute: 0.1 10*3/uL (ref 0.0–0.7)
HEMATOCRIT: 39.2 % (ref 36.0–46.0)
Hemoglobin: 13.1 g/dL (ref 12.0–15.0)
LYMPHS ABS: 1.9 10*3/uL (ref 0.7–4.0)
LYMPHS PCT: 28.1 % (ref 12.0–46.0)
MCHC: 33.6 g/dL (ref 30.0–36.0)
MCV: 90 fl (ref 78.0–100.0)
MONOS PCT: 6.5 % (ref 3.0–12.0)
Monocytes Absolute: 0.4 10*3/uL (ref 0.1–1.0)
NEUTROS ABS: 4.2 10*3/uL (ref 1.4–7.7)
NEUTROS PCT: 63.1 % (ref 43.0–77.0)
PLATELETS: 248 10*3/uL (ref 150.0–400.0)
RBC: 4.35 Mil/uL (ref 3.87–5.11)
RDW: 13.4 % (ref 11.5–15.5)
WBC: 6.7 10*3/uL (ref 4.0–10.5)

## 2015-09-13 LAB — MICROALBUMIN / CREATININE URINE RATIO
Creatinine,U: 251.8 mg/dL
MICROALB/CREAT RATIO: 0.8 mg/g (ref 0.0–30.0)
Microalb, Ur: 1.9 mg/dL (ref 0.0–1.9)

## 2015-09-13 LAB — HEMOGLOBIN A1C: HEMOGLOBIN A1C: 6.4 % (ref 4.6–6.5)

## 2015-09-13 NOTE — Progress Notes (Signed)
++  Subjective:   Cynthia Burns is a 69 y.o. female who presents for Medicare Annual (Subsequent) preventive examination.  Review of Systems:   Review of Systems  Constitutional: Negative for activity change, appetite change and fatigue.  HENT: Negative for hearing loss, congestion, tinnitus and ear discharge.   Eyes: Negative for visual disturbance (see optho q1y -- vision corrected to 20/20 with glasses).  Respiratory: Negative for cough, chest tightness and shortness of breath.   Cardiovascular: Negative for chest pain, palpitations and leg swelling.  Gastrointestinal: Negative for abdominal pain, diarrhea, constipation and abdominal distention.  Genitourinary: Negative for urgency, frequency, decreased urine volume and difficulty urinating.  Musculoskeletal: Negative for back pain, arthralgias and gait problem.  Skin: Negative for color change, pallor and rash.  Neurological: Negative for dizziness, light-headedness, numbness and headaches.  Hematological: Negative for adenopathy. Does not bruise/bleed easily.  Psychiatric/Behavioral: Negative for suicidal ideas, confusion, sleep disturbance, self-injury, dysphoric mood, decreased concentration and agitation.  Pt is able to read and write and can do all ADLs No risk for falling No abuse/ violence in home           Objective:     Vitals: BP 128/72 mmHg  Pulse 58  Temp(Src) 97.9 F (36.6 C) (Oral)  Ht _0  (1.575 m)  Wt 137 lb (62.143 kg)  BMI 25.05 kg/m2  SpO2 98% .BP 128/72 mmHg  Pulse 58  Temp(Src) 97.9 F (36.6 C) (Oral)  Ht _1  (1.575 m)  Wt 137 lb (62.143 kg)  BMI 25.05 kg/m2  SpO2 98% General appearance: alert, cooperative, appears stated age and no distress Head: Normocephalic, without obvious abnormality, atraumatic Eyes: conjunctivae/corneas clear. PERRL, EOM's intact. Fundi benign. Ears: normal TM's and external ear canals both ears Nose: Nares normal. Septum midline. Mucosa normal. No drainage or  sinus tenderness. Throat: lips, mucosa, and tongue normal; teeth and gums normal Neck: no adenopathy, no carotid bruit, no JVD, supple, symmetrical, trachea midline and thyroid not enlarged, symmetric, no tenderness/mass/nodules Back: symmetric, no curvature. ROM normal. No CVA tenderness. Lungs: clear to auscultation bilaterally Breasts: gyn Heart: regular rate and rhythm, S1, S2 normal, no murmur, click, rub or gallop Abdomen: soft, non-tender; bowel sounds normal; no masses,  no organomegaly Pelvic: deferred --gyn Extremities: extremities normal, atraumatic, no cyanosis or edema Pulses: 2+ and symmetric Skin: Skin color, texture, turgor normal. No rashes or lesions Lymph nodes: Cervical, supraclavicular, and axillary nodes normal. Neurologic: Alert and oriented X 3, normal strength and tone. Normal symmetric reflexes. Normal coordination and gait Psych-- no depression, no anxiety Tobacco History  Smoking status  . Never Smoker   Smokeless tobacco  . Never Used     Counseling given: Not Answered   Past Medical History  Diagnosis Date  . Overweight(278.02)   . HYPERTENSION   . MITRAL VALVE PROLAPSE   . PHARYNGITIS, ACUTE   . OSTEOPENIA   . Diabetes mellitus without complication   . 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 Abdominal hysterectomy     Family History  Problem Relation Age of Onset  . Emphysema Father   . Diabetes Father   . Heart disease Mother     Had a stent  . Hypertension    . Colon cancer Neg Hx    History  Sexual Activity  . Sexual Activity: Not on file    Outpatient Encounter Prescriptions as of 09/13/2015  Medication Sig  .  aspirin EC 81 MG tablet Take 1 tablet (81 mg total) by mouth daily.  . Biotin (PA BIOTIN) 1000 MCG tablet Take 1,000 mcg by mouth daily.  Marland Kitchen BYSTOLIC 5 MG tablet TAKE 1 TABLET DAILY  . Cyanocobalamin (VITAMIN B-12 PO) Take 1 tablet by mouth daily.   . furosemide  (LASIX) 20 MG tablet TAKE ONE-HALF (1/2) TABLET (10 MG) DAILY  . glucose blood (ONE TOUCH ULTRA TEST) test strip Check blood sugar daily E11.9  . losartan (COZAAR) 25 MG tablet TAKE 1 TABLET DAILY  . meclizine (ANTIVERT) 25 MG tablet Take 1 tablet (25 mg total) by mouth as needed for dizziness.  . metFORMIN (GLUCOPHAGE-XR) 500 MG 24 hr tablet TAKE 2 TABLETS IN THE MORNING AND 2 TABLETS IN THE EVENING  . ONETOUCH DELICA LANCETS 83T MISC Check blood sugar daily  . ONETOUCH DELICA LANCETS 51V MISC Check blood sugar once daily. E11.9  . rosuvastatin (CRESTOR) 20 MG tablet TAKE 1 TABLET DAILY  . VITAMIN D, CHOLECALCIFEROL, PO Take 1 tablet by mouth daily.    No facility-administered encounter medications on file as of 09/13/2015.    Activities of Daily Living In your present state of health, do you have any difficulty performing the following activities: 03/15/2015 03/15/2015  Hearing? (No Data) Y  Vision? - N  Difficulty concentrating or making decisions? - N  Walking or climbing stairs? - N  Dressing or bathing? - N  Doing errands, shopping? - N    Patient Care Team: Rosalita Chessman, DO as PCP - General Josue Hector, MD as Consulting Physician (Cardiology) Shon Hough, MD as Consulting Physician (Ophthalmology) Lafayette Dragon, MD (Gastroenterology)    Assessment:    cpe Exercise Activities and Dietary recommendations-- same     Goals    None     Fall Risk Fall Risk  03/15/2015 09/05/2013  Falls in the past year? No Yes  Number falls in past yr: - 1  Injury with Fall? - Yes  Risk for fall due to : - Other (Comment)  Risk for fall due to (comments): - [place] in a hole while gardening   Depression Screen PHQ 2/9 Scores 03/15/2015 09/05/2013  PHQ - 2 Score 0 1     Cognitive Testing No flowsheet data found.  Immunization History  Administered Date(s) Administered  . Influenza Whole 10/20/2007  . Influenza,inj,Quad PF,36+ Mos 09/05/2013, 09/05/2014  . Pneumococcal  Conjugate-13 02/27/2014  . Pneumococcal Polysaccharide-23 09/06/2011  . Zoster 09/14/2013   Screening Tests Health Maintenance  Topic Date Due  . Hepatitis C Screening  28-Feb-1946  . TETANUS/TDAP  12/19/1966  . INFLUENZA VACCINE  07/23/2015  . HEMOGLOBIN A1C  09/15/2015  . FOOT EXAM  03/14/2016  . OPHTHALMOLOGY EXAM  05/06/2016  . PNA vac Low Risk Adult (2 of 2 - PPSV23) 09/05/2016  . MAMMOGRAM  10/04/2016  . COLONOSCOPY  11/09/2023  . DEXA SCAN  Completed  . ZOSTAVAX  Completed      Plan:    see AVS During the course of the visit the patient was educated and counseled about the following appropriate screening and preventive services:   Vaccines to include Pneumoccal, Influenza, Hepatitis B, Td, Zostavax, HCV  Electrocardiogram  Cardiovascular Disease  Colorectal cancer screening  Bone density screening  Diabetes screening  Glaucoma screening  Mammography/PAP  Nutrition counseling   Patient Instructions (the written plan) was given to the patient.  1. Diabetes mellitus type II, controlled metformin - Comp Met (CMET) - Hemoglobin A1c -  Microalbumin / creatinine urine ratio - POCT urinalysis dipstick  2. Hyperlipidemia con't crestor - Comp Met (CMET) - Lipid panel - Microalbumin / creatinine urine ratio - POCT urinalysis dipstick  3. Essential hypertension Stable,  con't bystolic and losartan - Comp Met (CMET) - CBC with Differential/Platelet - Microalbumin / creatinine urine ratio - POCT urinalysis dipstick  4. Medicare annual wellness visit, subsequent See AVS  5. Need for hepatitis C screening test   - Hepatitis C antibody  6. Routine history and physical examination of adult    7. Encounter for immunization Flu shot given   8. Urine abnormality   - Urine Culture  9. Hematuria   - Urine Culture  10. Urine leukocytes   - Urine Culture   Garnet Koyanagi, DO  09/13/2015

## 2015-09-13 NOTE — Progress Notes (Signed)
Pre visit review using our clinic review tool, if applicable. No additional management support is needed unless otherwise documented below in the visit note. 

## 2015-09-13 NOTE — Patient Instructions (Signed)
Preventive Care for Adults A healthy lifestyle and preventive care can promote health and wellness. Preventive health guidelines for women include the following key practices.  A routine yearly physical is a good way to check with your health care provider about your health and preventive screening. It is a chance to share any concerns and updates on your health and to receive a thorough exam.  Visit your dentist for a routine exam and preventive care every 6 months. Brush your teeth twice a day and floss once a day. Good oral hygiene prevents tooth decay and gum disease.  The frequency of eye exams is based on your age, health, family medical history, use of contact lenses, and other factors. Follow your health care provider's recommendations for frequency of eye exams.  Eat a healthy diet. Foods like vegetables, fruits, whole grains, low-fat dairy products, and lean protein foods contain the nutrients you need without too many calories. Decrease your intake of foods high in solid fats, added sugars, and salt. Eat the right amount of calories for you.Get information about a proper diet from your health care provider, if necessary.  Regular physical exercise is one of the most important things you can do for your health. Most adults should get at least 150 minutes of moderate-intensity exercise (any activity that increases your heart rate and causes you to sweat) each week. In addition, most adults need muscle-strengthening exercises on 2 or more days a week.  Maintain a healthy weight. The body mass index (BMI) is a screening tool to identify possible weight problems. It provides an estimate of body fat based on height and weight. Your health care provider can find your BMI and can help you achieve or maintain a healthy weight.For adults 20 years and older:  A BMI below 18.5 is considered underweight.  A BMI of 18.5 to 24.9 is normal.  A BMI of 25 to 29.9 is considered overweight.  A BMI of  30 and above is considered obese.  Maintain normal blood lipids and cholesterol levels by exercising and minimizing your intake of saturated fat. Eat a balanced diet with plenty of fruit and vegetables. Blood tests for lipids and cholesterol should begin at age 76 and be repeated every 5 years. If your lipid or cholesterol levels are high, you are over 50, or you are at high risk for heart disease, you may need your cholesterol levels checked more frequently.Ongoing high lipid and cholesterol levels should be treated with medicines if diet and exercise are not working.  If you smoke, find out from your health care provider how to quit. If you do not use tobacco, do not start.  Lung cancer screening is recommended for adults aged 22-80 years who are at high risk for developing lung cancer because of a history of smoking. A yearly low-dose CT scan of the lungs is recommended for people who have at least a 30-pack-year history of smoking and are a current smoker or have quit within the past 15 years. A pack year of smoking is smoking an average of 1 pack of cigarettes a day for 1 year (for example: 1 pack a day for 30 years or 2 packs a day for 15 years). Yearly screening should continue until the smoker has stopped smoking for at least 15 years. Yearly screening should be stopped for people who develop a health problem that would prevent them from having lung cancer treatment.  If you are pregnant, do not drink alcohol. If you are breastfeeding,  be very cautious about drinking alcohol. If you are not pregnant and choose to drink alcohol, do not have more than 1 drink per day. One drink is considered to be 12 ounces (355 mL) of beer, 5 ounces (148 mL) of wine, or 1.5 ounces (44 mL) of liquor.  Avoid use of street drugs. Do not share needles with anyone. Ask for help if you need support or instructions about stopping the use of drugs.  High blood pressure causes heart disease and increases the risk of  stroke. Your blood pressure should be checked at least every 1 to 2 years. Ongoing high blood pressure should be treated with medicines if weight loss and exercise do not work.  If you are 75-52 years old, ask your health care provider if you should take aspirin to prevent strokes.  Diabetes screening involves taking a blood sample to check your fasting blood sugar level. This should be done once every 3 years, after age 15, if you are within normal weight and without risk factors for diabetes. Testing should be considered at a younger age or be carried out more frequently if you are overweight and have at least 1 risk factor for diabetes.  Breast cancer screening is essential preventive care for women. You should practice "breast self-awareness." This means understanding the normal appearance and feel of your breasts and may include breast self-examination. Any changes detected, no matter how small, should be reported to a health care provider. Women in their 58s and 30s should have a clinical breast exam (CBE) by a health care provider as part of a regular health exam every 1 to 3 years. After age 16, women should have a CBE every year. Starting at age 53, women should consider having a mammogram (breast X-ray test) every year. Women who have a family history of breast cancer should talk to their health care provider about genetic screening. Women at a high risk of breast cancer should talk to their health care providers about having an MRI and a mammogram every year.  Breast cancer gene (BRCA)-related cancer risk assessment is recommended for women who have family members with BRCA-related cancers. BRCA-related cancers include breast, ovarian, tubal, and peritoneal cancers. Having family members with these cancers may be associated with an increased risk for harmful changes (mutations) in the breast cancer genes BRCA1 and BRCA2. Results of the assessment will determine the need for genetic counseling and  BRCA1 and BRCA2 testing.  Routine pelvic exams to screen for cancer are no longer recommended for nonpregnant women who are considered low risk for cancer of the pelvic organs (ovaries, uterus, and vagina) and who do not have symptoms. Ask your health care provider if a screening pelvic exam is right for you.  If you have had past treatment for cervical cancer or a condition that could lead to cancer, you need Pap tests and screening for cancer for at least 20 years after your treatment. If Pap tests have been discontinued, your risk factors (such as having a new sexual partner) need to be reassessed to determine if screening should be resumed. Some women have medical problems that increase the chance of getting cervical cancer. In these cases, your health care provider may recommend more frequent screening and Pap tests.  The HPV test is an additional test that may be used for cervical cancer screening. The HPV test looks for the virus that can cause the cell changes on the cervix. The cells collected during the Pap test can be  tested for HPV. The HPV test could be used to screen women aged 30 years and older, and should be used in women of any age who have unclear Pap test results. After the age of 30, women should have HPV testing at the same frequency as a Pap test.  Colorectal cancer can be detected and often prevented. Most routine colorectal cancer screening begins at the age of 50 years and continues through age 75 years. However, your health care provider may recommend screening at an earlier age if you have risk factors for colon cancer. On a yearly basis, your health care provider may provide home test kits to check for hidden blood in the stool. Use of a small camera at the end of a tube, to directly examine the colon (sigmoidoscopy or colonoscopy), can detect the earliest forms of colorectal cancer. Talk to your health care provider about this at age 50, when routine screening begins. Direct  exam of the colon should be repeated every 5-10 years through age 75 years, unless early forms of pre-cancerous polyps or small growths are found.  People who are at an increased risk for hepatitis B should be screened for this virus. You are considered at high risk for hepatitis B if:  You were born in a country where hepatitis B occurs often. Talk with your health care provider about which countries are considered high risk.  Your parents were born in a high-risk country and you have not received a shot to protect against hepatitis B (hepatitis B vaccine).  You have HIV or AIDS.  You use needles to inject street drugs.  You live with, or have sex with, someone who has hepatitis B.  You get hemodialysis treatment.  You take certain medicines for conditions like cancer, organ transplantation, and autoimmune conditions.  Hepatitis C blood testing is recommended for all people born from 1945 through 1965 and any individual with known risks for hepatitis C.  Practice safe sex. Use condoms and avoid high-risk sexual practices to reduce the spread of sexually transmitted infections (STIs). STIs include gonorrhea, chlamydia, syphilis, trichomonas, herpes, HPV, and human immunodeficiency virus (HIV). Herpes, HIV, and HPV are viral illnesses that have no cure. They can result in disability, cancer, and death.  You should be screened for sexually transmitted illnesses (STIs) including gonorrhea and chlamydia if:  You are sexually active and are younger than 24 years.  You are older than 24 years and your health care provider tells you that you are at risk for this type of infection.  Your sexual activity has changed since you were last screened and you are at an increased risk for chlamydia or gonorrhea. Ask your health care provider if you are at risk.  If you are at risk of being infected with HIV, it is recommended that you take a prescription medicine daily to prevent HIV infection. This is  called preexposure prophylaxis (PrEP). You are considered at risk if:  You are a heterosexual woman, are sexually active, and are at increased risk for HIV infection.  You take drugs by injection.  You are sexually active with a partner who has HIV.  Talk with your health care provider about whether you are at high risk of being infected with HIV. If you choose to begin PrEP, you should first be tested for HIV. You should then be tested every 3 months for as long as you are taking PrEP.  Osteoporosis is a disease in which the bones lose minerals and strength   with aging. This can result in serious bone fractures or breaks. The risk of osteoporosis can be identified using a bone density scan. Women ages 65 years and over and women at risk for fractures or osteoporosis should discuss screening with their health care providers. Ask your health care provider whether you should take a calcium supplement or vitamin D to reduce the rate of osteoporosis.  Menopause can be associated with physical symptoms and risks. Hormone replacement therapy is available to decrease symptoms and risks. You should talk to your health care provider about whether hormone replacement therapy is right for you.  Use sunscreen. Apply sunscreen liberally and repeatedly throughout the day. You should seek shade when your shadow is shorter than you. Protect yourself by wearing long sleeves, pants, a wide-brimmed hat, and sunglasses year round, whenever you are outdoors.  Once a month, do a whole body skin exam, using a mirror to look at the skin on your back. Tell your health care provider of new moles, moles that have irregular borders, moles that are larger than a pencil eraser, or moles that have changed in shape or color.  Stay current with required vaccines (immunizations).  Influenza vaccine. All adults should be immunized every year.  Tetanus, diphtheria, and acellular pertussis (Td, Tdap) vaccine. Pregnant women should  receive 1 dose of Tdap vaccine during each pregnancy. The dose should be obtained regardless of the length of time since the last dose. Immunization is preferred during the 27th-36th week of gestation. An adult who has not previously received Tdap or who does not know her vaccine status should receive 1 dose of Tdap. This initial dose should be followed by tetanus and diphtheria toxoids (Td) booster doses every 10 years. Adults with an unknown or incomplete history of completing a 3-dose immunization series with Td-containing vaccines should begin or complete a primary immunization series including a Tdap dose. Adults should receive a Td booster every 10 years.  Varicella vaccine. An adult without evidence of immunity to varicella should receive 2 doses or a second dose if she has previously received 1 dose. Pregnant females who do not have evidence of immunity should receive the first dose after pregnancy. This first dose should be obtained before leaving the health care facility. The second dose should be obtained 4-8 weeks after the first dose.  Human papillomavirus (HPV) vaccine. Females aged 13-26 years who have not received the vaccine previously should obtain the 3-dose series. The vaccine is not recommended for use in pregnant females. However, pregnancy testing is not needed before receiving a dose. If a female is found to be pregnant after receiving a dose, no treatment is needed. In that case, the remaining doses should be delayed until after the pregnancy. Immunization is recommended for any person with an immunocompromised condition through the age of 26 years if she did not get any or all doses earlier. During the 3-dose series, the second dose should be obtained 4-8 weeks after the first dose. The third dose should be obtained 24 weeks after the first dose and 16 weeks after the second dose.  Zoster vaccine. One dose is recommended for adults aged 60 years or older unless certain conditions are  present.  Measles, mumps, and rubella (MMR) vaccine. Adults born before 1957 generally are considered immune to measles and mumps. Adults born in 1957 or later should have 1 or more doses of MMR vaccine unless there is a contraindication to the vaccine or there is laboratory evidence of immunity to   each of the three diseases. A routine second dose of MMR vaccine should be obtained at least 28 days after the first dose for students attending postsecondary schools, health care workers, or international travelers. People who received inactivated measles vaccine or an unknown type of measles vaccine during 1963-1967 should receive 2 doses of MMR vaccine. People who received inactivated mumps vaccine or an unknown type of mumps vaccine before 1979 and are at high risk for mumps infection should consider immunization with 2 doses of MMR vaccine. For females of childbearing age, rubella immunity should be determined. If there is no evidence of immunity, females who are not pregnant should be vaccinated. If there is no evidence of immunity, females who are pregnant should delay immunization until after pregnancy. Unvaccinated health care workers born before 1957 who lack laboratory evidence of measles, mumps, or rubella immunity or laboratory confirmation of disease should consider measles and mumps immunization with 2 doses of MMR vaccine or rubella immunization with 1 dose of MMR vaccine.  Pneumococcal 13-valent conjugate (PCV13) vaccine. When indicated, a person who is uncertain of her immunization history and has no record of immunization should receive the PCV13 vaccine. An adult aged 19 years or older who has certain medical conditions and has not been previously immunized should receive 1 dose of PCV13 vaccine. This PCV13 should be followed with a dose of pneumococcal polysaccharide (PPSV23) vaccine. The PPSV23 vaccine dose should be obtained at least 8 weeks after the dose of PCV13 vaccine. An adult aged 19  years or older who has certain medical conditions and previously received 1 or more doses of PPSV23 vaccine should receive 1 dose of PCV13. The PCV13 vaccine dose should be obtained 1 or more years after the last PPSV23 vaccine dose.  Pneumococcal polysaccharide (PPSV23) vaccine. When PCV13 is also indicated, PCV13 should be obtained first. All adults aged 65 years and older should be immunized. An adult younger than age 65 years who has certain medical conditions should be immunized. Any person who resides in a nursing home or long-term care facility should be immunized. An adult smoker should be immunized. People with an immunocompromised condition and certain other conditions should receive both PCV13 and PPSV23 vaccines. People with human immunodeficiency virus (HIV) infection should be immunized as soon as possible after diagnosis. Immunization during chemotherapy or radiation therapy should be avoided. Routine use of PPSV23 vaccine is not recommended for American Indians, [place] Natives, or people younger than 65 years unless there are medical conditions that require PPSV23 vaccine. When indicated, people who have unknown immunization and have no record of immunization should receive PPSV23 vaccine. One-time revaccination 5 years after the first dose of PPSV23 is recommended for people aged 19-64 years who have chronic kidney failure, nephrotic syndrome, asplenia, or immunocompromised conditions. People who received 1-2 doses of PPSV23 before age 65 years should receive another dose of PPSV23 vaccine at age 65 years or later if at least 5 years have passed since the previous dose. Doses of PPSV23 are not needed for people immunized with PPSV23 at or after age 65 years.  Meningococcal vaccine. Adults with asplenia or persistent complement component deficiencies should receive 2 doses of quadrivalent meningococcal conjugate (MenACWY-D) vaccine. The doses should be obtained at least 2 months apart.  Microbiologists working with certain meningococcal bacteria, military recruits, people at risk during an outbreak, and people who travel to or live in countries with a high rate of meningitis should be immunized. A first-year college student up through age   21 years who is living in a residence hall should receive a dose if she did not receive a dose on or after her 16th birthday. Adults who have certain high-risk conditions should receive one or more doses of vaccine.  Hepatitis A vaccine. Adults who wish to be protected from this disease, have certain high-risk conditions, work with hepatitis A-infected animals, work in hepatitis A research labs, or travel to or work in countries with a high rate of hepatitis A should be immunized. Adults who were previously unvaccinated and who anticipate close contact with an international adoptee during the first 60 days after arrival in the [place] from a country with a high rate of hepatitis A should be immunized.  Hepatitis B vaccine. Adults who wish to be protected from this disease, have certain high-risk conditions, may be exposed to blood or other infectious body fluids, are household contacts or sex partners of hepatitis B positive people, are clients or workers in certain care facilities, or travel to or work in countries with a high rate of hepatitis B should be immunized.  Haemophilus influenzae type b (Hib) vaccine. A previously unvaccinated person with asplenia or sickle cell disease or having a scheduled splenectomy should receive 1 dose of Hib vaccine. Regardless of previous immunization, a recipient of a hematopoietic stem cell transplant should receive a 3-dose series 6-12 months after her successful transplant. Hib vaccine is not recommended for adults with HIV infection. Preventive Services / Frequency Ages 64 to 68 years  Blood pressure check.** / Every 1 to 2 years.  Lipid and cholesterol check.** / Every 5 years beginning at age  22.  Clinical breast exam.** / Every 3 years for women in their 88s and 53s.  BRCA-related cancer risk assessment.** / For women who have family members with a BRCA-related cancer (breast, ovarian, tubal, or peritoneal cancers).  Pap test.** / Every 2 years from ages 90 through 51. Every 3 years starting at age 21 through age 56 or 3 with a history of 3 consecutive normal Pap tests.  HPV screening.** / Every 3 years from ages 24 through ages 1 to 46 with a history of 3 consecutive normal Pap tests.  Hepatitis C blood test.** / For any individual with known risks for hepatitis C.  Skin self-exam. / Monthly.  Influenza vaccine. / Every year.  Tetanus, diphtheria, and acellular pertussis (Tdap, Td) vaccine.** / Consult your health care provider. Pregnant women should receive 1 dose of Tdap vaccine during each pregnancy. 1 dose of Td every 10 years.  Varicella vaccine.** / Consult your health care provider. Pregnant females who do not have evidence of immunity should receive the first dose after pregnancy.  HPV vaccine. / 3 doses over 6 months, if 72 and younger. The vaccine is not recommended for use in pregnant females. However, pregnancy testing is not needed before receiving a dose.  Measles, mumps, rubella (MMR) vaccine.** / You need at least 1 dose of MMR if you were born in 1957 or later. You may also need a 2nd dose. For females of childbearing age, rubella immunity should be determined. If there is no evidence of immunity, females who are not pregnant should be vaccinated. If there is no evidence of immunity, females who are pregnant should delay immunization until after pregnancy.  Pneumococcal 13-valent conjugate (PCV13) vaccine.** / Consult your health care provider.  Pneumococcal polysaccharide (PPSV23) vaccine.** / 1 to 2 doses if you smoke cigarettes or if you have certain conditions.  Meningococcal vaccine.** /  1 dose if you are age 19 to 21 years and a first-year college  student living in a residence hall, or have one of several medical conditions, you need to get vaccinated against meningococcal disease. You may also need additional booster doses.  Hepatitis A vaccine.** / Consult your health care provider.  Hepatitis B vaccine.** / Consult your health care provider.  Haemophilus influenzae type b (Hib) vaccine.** / Consult your health care provider. Ages 40 to 64 years  Blood pressure check.** / Every 1 to 2 years.  Lipid and cholesterol check.** / Every 5 years beginning at age 20 years.  Lung cancer screening. / Every year if you are aged 55-80 years and have a 30-pack-year history of smoking and currently smoke or have quit within the past 15 years. Yearly screening is stopped once you have quit smoking for at least 15 years or develop a health problem that would prevent you from having lung cancer treatment.  Clinical breast exam.** / Every year after age 40 years.  BRCA-related cancer risk assessment.** / For women who have family members with a BRCA-related cancer (breast, ovarian, tubal, or peritoneal cancers).  Mammogram.** / Every year beginning at age 40 years and continuing for as long as you are in good health. Consult with your health care provider.  Pap test.** / Every 3 years starting at age 30 years through age 65 or 70 years with a history of 3 consecutive normal Pap tests.  HPV screening.** / Every 3 years from ages 30 years through ages 65 to 70 years with a history of 3 consecutive normal Pap tests.  Fecal occult blood test (FOBT) of stool. / Every year beginning at age 50 years and continuing until age 75 years. You may not need to do this test if you get a colonoscopy every 10 years.  Flexible sigmoidoscopy or colonoscopy.** / Every 5 years for a flexible sigmoidoscopy or every 10 years for a colonoscopy beginning at age 50 years and continuing until age 75 years.  Hepatitis C blood test.** / For all people born from 1945 through  1965 and any individual with known risks for hepatitis C.  Skin self-exam. / Monthly.  Influenza vaccine. / Every year.  Tetanus, diphtheria, and acellular pertussis (Tdap/Td) vaccine.** / Consult your health care provider. Pregnant women should receive 1 dose of Tdap vaccine during each pregnancy. 1 dose of Td every 10 years.  Varicella vaccine.** / Consult your health care provider. Pregnant females who do not have evidence of immunity should receive the first dose after pregnancy.  Zoster vaccine.** / 1 dose for adults aged 60 years or older.  Measles, mumps, rubella (MMR) vaccine.** / You need at least 1 dose of MMR if you were born in 1957 or later. You may also need a 2nd dose. For females of childbearing age, rubella immunity should be determined. If there is no evidence of immunity, females who are not pregnant should be vaccinated. If there is no evidence of immunity, females who are pregnant should delay immunization until after pregnancy.  Pneumococcal 13-valent conjugate (PCV13) vaccine.** / Consult your health care provider.  Pneumococcal polysaccharide (PPSV23) vaccine.** / 1 to 2 doses if you smoke cigarettes or if you have certain conditions.  Meningococcal vaccine.** / Consult your health care provider.  Hepatitis A vaccine.** / Consult your health care provider.  Hepatitis B vaccine.** / Consult your health care provider.  Haemophilus influenzae type b (Hib) vaccine.** / Consult your health care provider. Ages 65   years and over  Blood pressure check.** / Every 1 to 2 years.  Lipid and cholesterol check.** / Every 5 years beginning at age 22 years.  Lung cancer screening. / Every year if you are aged 73-80 years and have a 30-pack-year history of smoking and currently smoke or have quit within the past 15 years. Yearly screening is stopped once you have quit smoking for at least 15 years or develop a health problem that would prevent you from having lung cancer  treatment.  Clinical breast exam.** / Every year after age 4 years.  BRCA-related cancer risk assessment.** / For women who have family members with a BRCA-related cancer (breast, ovarian, tubal, or peritoneal cancers).  Mammogram.** / Every year beginning at age 40 years and continuing for as long as you are in good health. Consult with your health care provider.  Pap test.** / Every 3 years starting at age 9 years through age 34 or 91 years with 3 consecutive normal Pap tests. Testing can be stopped between 65 and 70 years with 3 consecutive normal Pap tests and no abnormal Pap or HPV tests in the past 10 years.  HPV screening.** / Every 3 years from ages 57 years through ages 64 or 45 years with a history of 3 consecutive normal Pap tests. Testing can be stopped between 65 and 70 years with 3 consecutive normal Pap tests and no abnormal Pap or HPV tests in the past 10 years.  Fecal occult blood test (FOBT) of stool. / Every year beginning at age 15 years and continuing until age 17 years. You may not need to do this test if you get a colonoscopy every 10 years.  Flexible sigmoidoscopy or colonoscopy.** / Every 5 years for a flexible sigmoidoscopy or every 10 years for a colonoscopy beginning at age 86 years and continuing until age 71 years.  Hepatitis C blood test.** / For all people born from [birth year] through 1964 and any individual with known risks for hepatitis C.  Osteoporosis screening.** / A one-time screening for women ages 83 years and over and women at risk for fractures or osteoporosis.  Skin self-exam. / Monthly.  Influenza vaccine. / Every year.  Tetanus, diphtheria, and acellular pertussis (Tdap/Td) vaccine.** / 1 dose of Td every 10 years.  Varicella vaccine.** / Consult your health care provider.  Zoster vaccine.** / 1 dose for adults aged 61 years or older.  Pneumococcal 13-valent conjugate (PCV13) vaccine.** / Consult your health care provider.  Pneumococcal  polysaccharide (PPSV23) vaccine.** / 1 dose for all adults aged 28 years and older.  Meningococcal vaccine.** / Consult your health care provider.  Hepatitis A vaccine.** / Consult your health care provider.  Hepatitis B vaccine.** / Consult your health care provider.  Haemophilus influenzae type b (Hib) vaccine.** / Consult your health care provider. ** Family history and personal history of risk and conditions may change your health care provider's recommendations. Document Released: 02/03/2002 Document Revised: 04/24/2014 Document Reviewed: 05/05/2011 [facility] Patient Information 2015 [place], [place]. This information is not intended to replace advice given to you by your health care provider. Make sure you discuss any questions you have with your health care provider.

## 2015-09-14 LAB — URINE CULTURE

## 2015-09-14 LAB — HEPATITIS C ANTIBODY: HCV Ab: NEGATIVE

## 2015-09-17 ENCOUNTER — Encounter: Payer: Self-pay | Admitting: *Deleted

## 2015-09-17 NOTE — Addendum Note (Signed)
Addended by: Leticia Penna A on: 09/17/2015 01:44 PM   Modules accepted: Orders

## 2015-10-08 ENCOUNTER — Ambulatory Visit
Admission: RE | Admit: 2015-10-08 | Discharge: 2015-10-08 | Disposition: A | Discharge status: Home / Self Care | Payer: Medicare Other | Source: Ambulatory Visit

## 2015-10-08 DIAGNOSIS — Z1231 Encounter for screening mammogram for malignant neoplasm of breast: Secondary | ICD-10-CM

## 2015-10-10 ENCOUNTER — Other Ambulatory Visit: Payer: Self-pay | Admitting: Family Medicine

## 2015-10-10 DIAGNOSIS — R928 Other abnormal and inconclusive findings on diagnostic imaging of breast: Secondary | ICD-10-CM

## 2015-10-15 ENCOUNTER — Ambulatory Visit
Admission: RE | Admit: 2015-10-15 | Discharge: 2015-10-15 | Disposition: A | Discharge status: Home / Self Care | Payer: Medicare Other | Source: Ambulatory Visit | Attending: Family Medicine | Admitting: Family Medicine

## 2015-10-15 DIAGNOSIS — R928 Other abnormal and inconclusive findings on diagnostic imaging of breast: Secondary | ICD-10-CM

## 2015-10-25 ENCOUNTER — Other Ambulatory Visit: Payer: Self-pay | Admitting: Family Medicine

## 2015-11-22 ENCOUNTER — Encounter: Payer: Self-pay | Admitting: *Deleted

## 2015-11-22 NOTE — Progress Notes (Signed)
[name], female   DOB: 04-30-1946, 69 y.o.   MRN: [id number] Cynthia Burns returns today for followup. 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 Unfortunately she continues to be significant he overweight. We talked about this at length. He needs to assume a low carbohydrate diet. Lot of her social activities seem to revolve around food. She does have a treadmill at home but is sedentary. I talked to her about an exercise prescription including a heart rate of 130 beats per minute as the target. She is on diastolic but I think [id number] beats per minute his achievable given her current deconditioned state. 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 The non-coronary sinus of Valsalva measure 2.5 cm vertically along the axis of the aorta or an approximately 1.2 cm transvers She has no significant aortic stenosis or insufficiency and her valve is trileaflet. Last echo in January of 09 showed no significant progression.   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  All other systems reviewed and negative  General: Affect appropriate Healthy:  appears stated age 66: normal Neck supple with no adenopathy JVP  normal no bruits no thyromegaly Lungs clear with no wheezing and good diaphragmatic motion Heart:  S1/S2 no murmur, no rub, gallop or click PMI normal Abdomen: benighn, BS positve, no tenderness, no AAA no bruit.  No HSM or HJR Distal pulses intact with no bruits No edema Neuro non-focal Skin warm and dry No muscular weakness   Current Outpatient Prescriptions  Medication Sig Dispense Refill  . aspirin EC 81 MG tablet Take 1 tablet (81 mg total) by mouth daily. 90 tablet 3  . Biotin (PA BIOTIN) 1000 MCG tablet Take 1,000 mcg by mouth daily.    . Cyanocobalamin (VITAMIN B-12 PO) Take 1 tablet by mouth daily.     . furosemide (LASIX) 20 MG tablet Take 10 mg by mouth daily.    Marland Kitchen losartan (COZAAR) 25 MG tablet Take 25 mg by mouth daily.    . meclizine (ANTIVERT) 25 MG tablet Take 1 tablet (25 mg total) by mouth as needed for dizziness. 30 tablet 5  . metFORMIN (GLUMETZA) 500 MG (MOD) 24 hr tablet Take 1,000 mg by mouth 2 (two) times daily with a meal.    . nebivolol (BYSTOLIC) 5 MG tablet Take 5 mg by mouth daily.    . rosuvastatin (CRESTOR) 20 MG tablet Take 20 mg by mouth daily.    Marland Kitchen VITAMIN D, CHOLECALCIFEROL, PO Take 1 tablet by mouth daily.      No current facility-administered medications for this visit.    Allergies  Mucinex; Codeine; Advicor; Amlodipine; Lisinopril-hydrochlorothiazide; Other; Ramipril; and Sulfonamide derivatives  Electrocardiogram:  11/22/14  SR rate 72  Low voltage no change from 2014  11/26/15  SR rate 72  Normal low voltage  Assessment and Plan SVA: F/U echo if looks stable consider MRI in a year DM: Discussed low carb diet.  Target hemoglobin A1c is 6.5 or less.  Continue current medications. Obesity: Exercise and low carb diet discussed Chol:  Lab Results  Component Value Date   LDLCALC 51 09/13/2015   HTN:  Well controlled.  Continue current medications and low sodium Dash type diet.   Edema: stable can consider adding diuretic in future if  needed   [employer]

## 2015-11-26 ENCOUNTER — Encounter: Payer: Self-pay | Admitting: Cardiovascular Disease

## 2015-11-26 ENCOUNTER — Ambulatory Visit (INDEPENDENT_AMBULATORY_CARE_PROVIDER_SITE_OTHER): Payer: Medicare Other | Admitting: Cardiovascular Disease

## 2015-11-26 VITALS — BP 140/80 | HR 72 | Ht 62.0 in | Wt 137.4 lb

## 2015-11-26 DIAGNOSIS — Q2543 Congenital aneurysm of aorta: Secondary | ICD-10-CM | POA: Diagnosis not present

## 2015-11-26 DIAGNOSIS — I10 Essential (primary) hypertension: Secondary | ICD-10-CM | POA: Diagnosis not present

## 2015-11-26 DIAGNOSIS — Q2549 Other congenital malformations of aorta: Secondary | ICD-10-CM

## 2015-11-26 NOTE — Patient Instructions (Addendum)
Medication Instructions:  Your physician recommends that you continue on your current medications as directed. Please refer to the Current Medication list given to you today.  Labwork: NONE  Testing/Procedures: Your physician has requested that you have an echocardiogram. Echocardiography is a painless test that uses sound waves to create images of your heart. It provides your doctor with information about the size and shape of your heart and how well your heart's chambers and valves are working. This procedure takes approximately one hour. There are no restrictions for this procedure.  Follow-Up: Your physician wants you to follow-up in: 6 months with Dr. Johnsie Cancel. You will receive a reminder letter in the mail two months in advance. If you don't receive a letter, please call our office to schedule the follow-up appointment.  Any Other Special Instructions Will Be Listed Below (If Applicable).     If you need a refill on your cardiac medications before your next appointment, please call your pharmacy.

## 2015-12-05 ENCOUNTER — Other Ambulatory Visit: Payer: Self-pay

## 2015-12-05 ENCOUNTER — Ambulatory Visit (HOSPITAL_COMMUNITY): Payer: Medicare Other | Attending: Cardiovascular Disease

## 2015-12-05 DIAGNOSIS — Q2549 Other congenital malformations of aorta: Secondary | ICD-10-CM

## 2015-12-05 DIAGNOSIS — I10 Essential (primary) hypertension: Secondary | ICD-10-CM | POA: Diagnosis not present

## 2015-12-05 DIAGNOSIS — I071 Rheumatic tricuspid insufficiency: Secondary | ICD-10-CM | POA: Diagnosis not present

## 2015-12-05 DIAGNOSIS — Q2543 Congenital aneurysm of aorta: Secondary | ICD-10-CM

## 2015-12-08 ENCOUNTER — Other Ambulatory Visit: Payer: Self-pay | Admitting: Cardiovascular Disease

## 2015-12-26 ENCOUNTER — Ambulatory Visit (INDEPENDENT_AMBULATORY_CARE_PROVIDER_SITE_OTHER): Payer: Medicare Other | Admitting: Medical

## 2015-12-26 ENCOUNTER — Encounter: Payer: Self-pay | Admitting: Medical

## 2015-12-26 VITALS — BP 124/80 | HR 81 | Temp 97.8°F | Ht 62.0 in | Wt 141.2 lb

## 2015-12-26 DIAGNOSIS — R059 Cough, unspecified: Secondary | ICD-10-CM

## 2015-12-26 DIAGNOSIS — J320 Chronic maxillary sinusitis: Secondary | ICD-10-CM | POA: Diagnosis not present

## 2015-12-26 DIAGNOSIS — R05 Cough: Secondary | ICD-10-CM

## 2015-12-26 MED ORDER — AZITHROMYCIN 250 MG PO TABS: ORAL_TABLET | ORAL | Status: DC

## 2015-12-26 MED ORDER — MOXIFLOXACIN HCL 0.5 % OP SOLN: 1.0000 [drp] | Freq: Three times a day (TID) | OPHTHALMIC | Status: DC

## 2015-12-26 MED ORDER — BENZONATATE 100 MG PO CAPS: 100.0000 mg | ORAL_CAPSULE | Freq: Three times a day (TID) | ORAL | Status: DC | PRN

## 2015-12-26 MED ORDER — FLUTICASONE PROPIONATE 50 MCG/ACT NA SUSP: 2.0000 | Freq: Every day | NASAL | Status: DC

## 2015-12-26 NOTE — Progress Notes (Signed)
Pre visit review using our clinic review tool, if applicable. No additional management support is needed unless otherwise documented below in the visit note. 

## 2015-12-26 NOTE — Progress Notes (Signed)
Subjective:    [name], female    DOB: [birth year], 70 y.o.   MRN: [id number]  HPI  Pt in for some symptoms present for 8 days or so. Started mild tickle to throat and some laryngitis early on. Pt been on robitussin and not really helping much.   Also mild rt eye matting last 2 days but not this am.   Some occasional productive cough. Pt states last night some faint upper teeth pain and faint sinus pressure.  Pt is on allegra. Pt not on steroid nasal spray.     Review of Systems  Constitutional: Positive for fatigue. Negative for fever and chills.  HENT: Positive for congestion. Negative for ear discharge, ear pain, nosebleeds, postnasal drip, rhinorrhea, sneezing and sore throat.        Faint sinus pressure and congestion.  Respiratory: Positive for cough. Negative for apnea, choking and shortness of breath.   Cardiovascular: Negative for chest pain and palpitations.  Gastrointestinal: Negative for abdominal pain.  Musculoskeletal: Negative for myalgias and back pain.  Neurological: Negative for facial asymmetry and headaches.  Hematological: Negative for adenopathy. Does not bruise/bleed easily.     Past Medical History  Diagnosis Date  . Overweight(278.02)   . HYPERTENSION   . MITRAL VALVE PROLAPSE   . PHARYNGITIS, ACUTE   . OSTEOPENIA   . Diabetes mellitus without complication ([place])   . Heart aneurysm     echo done every year    Social History   Social History  . Marital Status: Married    Spouse Name: N/A  . Number of Children: N/A  . Years of Education: N/A   Occupational History  . Not on file.   Social History Main Topics  . Smoking status: Never Smoker   . Smokeless tobacco: Never Used  . Alcohol Use: No  . Drug Use: No  . Sexual Activity: Not on file   Other Topics Concern  . Not on file   Social History Narrative    Past Surgical History  Procedure Laterality Date  . Abdominal hysterectomy      Family History    Problem Relation Age of Onset  . Emphysema Father   . Diabetes Father   . Heart disease Mother     Had a stent  . Hypertension    . Colon cancer Neg Hx     Allergies  Allergen Reactions  . Mucinex [Guaifenesin Er] Anaphylaxis  . Codeine Nausea And Vomiting  . Advicor [Niacin-Lovastatin Er] Rash  . Amlodipine Rash  . Lisinopril-Hydrochlorothiazide Rash  . Other Rash    Eye drops with preservatives.    . Ramipril Rash  . Sulfonamide Derivatives Rash    Current Outpatient Prescriptions on File Prior to Visit  Medication Sig Dispense Refill  . aspirin EC 81 MG tablet Take 1 tablet (81 mg total) by mouth daily. 90 tablet 3  . Biotin (PA BIOTIN) 1000 MCG tablet Take 1,000 mcg by mouth daily.    Marland Kitchen BYSTOLIC 5 MG tablet TAKE 1 TABLET DAILY 90 tablet 1  . Cyanocobalamin (VITAMIN B-12 PO) Take 1 tablet by mouth daily.     . furosemide (LASIX) 20 MG tablet Take 10 mg by mouth daily.    Marland Kitchen losartan (COZAAR) 25 MG tablet TAKE 1 TABLET DAILY 90 tablet 1  . meclizine (ANTIVERT) 25 MG tablet Take 1 tablet (25 mg total) by mouth as needed for dizziness. 30 tablet 5  . metFORMIN (GLUMETZA) 500 MG (MOD)  24 hr tablet Take 1,000 mg by mouth 2 (two) times daily with a meal.    . rosuvastatin (CRESTOR) 20 MG tablet TAKE 1 TABLET DAILY 90 tablet 1  . VITAMIN D, CHOLECALCIFEROL, PO Take 1 tablet by mouth daily.      No current facility-administered medications on file prior to visit.    BP 124/80 mmHg  Pulse 81  Temp(Src) 97.8 F (36.6 C) (Oral)  Ht 5\' 2"  (1.575 m)  Wt 141 lb 3.2 oz (64.048 kg)  BMI 25.82 kg/m2  SpO2 98%       Objective:   Physical Exam  General  Mental Status - Alert. General Appearance - Well groomed. Not in acute distress.  Skin Rashes- No Rashes.  Eyes- both eyes conjunctiva clear. No matting or dc. NO lid swelling.  HEENT Head- Normal. Ear Auditory Canal - Left- Normal. Right - Normal.Tympanic Membrane- Left- Normal. Right- Normal. Eye  Sclera/Conjunctiva- Left- Normal. Right- Normal. Nose & Sinuses Nasal Mucosa- Left-  Boggy and Congested. Right-  Boggy and  Congested.Bilateral faint  Maxillary pressure but no frontal sinus pressure. Mouth & Throat Lips: Upper Lip- Normal: no dryness, cracking, pallor, cyanosis, or vesicular eruption. Lower Lip-Normal: no dryness, cracking, pallor, cyanosis or vesicular eruption. Buccal Mucosa- Bilateral- No Aphthous ulcers. Oropharynx- No Discharge or Erythema. +pnd. Tonsils: Characteristics- Bilateral- No Erythema or Congestion. Size/Enlargement- Bilateral- No enlargement. Discharge- bilateral-None.  Neck Neck- Supple. No Masses.   Chest and Lung Exam Auscultation: Breath Sounds:-Clear even and unlabored.  Cardiovascular Auscultation:Rythm- Regular, rate and rhythm. Murmurs & Other Heart Sounds:Ausculatation of the heart reveal- No Murmurs.  Lymphatic Head & Neck General Head & Neck Lymphatics: Bilateral: Description- No Localized lymphadenopathy.       Assessment & Plan:  For probable early sinusitis and possible bronchitis will rx azithromycin antibiotic and benzonatate cough tablet.  For any nasal congestion or pnd rx flonase.  If you have any recurrent matting of eyes rx vigamox.  Your mild hoarse voice should resolve in 10-14 days. If not let us know consider referral to ENT in that event.  Follow up 10 days or as needed

## 2015-12-26 NOTE — Patient Instructions (Signed)
For probable early sinusitis and possible bronchitis will rx azithromycin antibiotic and benzonatate cough tablet.  For any nasal congestion or pnd rx flonase.  If you have any recurrent matting of eyes rx vigamox.  Your mild hoarse voice should resolve in 10-14 days. If not let us know consider referral to ENT in that event.  Follow up 10 days or as needed

## 2016-02-28 ENCOUNTER — Other Ambulatory Visit: Payer: Self-pay | Admitting: Cardiovascular Disease

## 2016-02-28 MED ORDER — FUROSEMIDE 20 MG PO TABS: 10.0000 mg | ORAL_TABLET | Freq: Every day | ORAL | Status: DC

## 2016-02-28 NOTE — Telephone Encounter (Signed)
°*  STAT* If patient is at the pharmacy, call can be transferred to refill team.   1. Which medications need to be refilled? (please list name of each medication and dose if known) Rosuovastatiin 20 mg,Bystolic 5 mg,Losartan 25 mg,Furosemide 20 mg-changing pharmacy  2. Which pharmacy/location (including street and city if local pharmacy) is medication to be sent to?[phone] Fax#[phone]--new pharmacy  3. Do they need a 30 day or 90 day supply? 90 and refills

## 2016-03-10 ENCOUNTER — Telehealth: Payer: Self-pay | Admitting: Cardiovascular Disease

## 2016-03-10 NOTE — Telephone Encounter (Signed)
°*  STAT* If patient is at the pharmacy, call can be transferred to refill team.   1. Which medications need to be refilled? (please list name of each medication and dose if known) Furosemide 20 mg-changing pharmacy,does not have appt until 05-22-16  2. Which pharmacy/location (including street and city if local pharmacy) is medication to be sent to?[phone]-she not sure if this is the phone number or fax number3 3. Do they need a 30 day or 90 day supply? [place]

## 2016-03-13 ENCOUNTER — Ambulatory Visit (INDEPENDENT_AMBULATORY_CARE_PROVIDER_SITE_OTHER): Payer: Medicare Other | Admitting: Family Medicine

## 2016-03-13 ENCOUNTER — Encounter: Payer: Self-pay | Admitting: Family Medicine

## 2016-03-13 VITALS — BP 138/81 | HR 58 | Temp 97.9°F | Ht 62.0 in | Wt 139.2 lb

## 2016-03-13 DIAGNOSIS — E785 Hyperlipidemia, unspecified: Secondary | ICD-10-CM | POA: Diagnosis not present

## 2016-03-13 DIAGNOSIS — I10 Essential (primary) hypertension: Secondary | ICD-10-CM | POA: Diagnosis not present

## 2016-03-13 DIAGNOSIS — E119 Type 2 diabetes mellitus without complications: Secondary | ICD-10-CM | POA: Diagnosis not present

## 2016-03-13 DIAGNOSIS — M79601 Pain in right arm: Secondary | ICD-10-CM

## 2016-03-13 LAB — POCT URINALYSIS DIPSTICK
BILIRUBIN UA: NEGATIVE
GLUCOSE UA: NEGATIVE
Ketones, UA: NEGATIVE
Leukocytes, UA: NEGATIVE
Nitrite, UA: NEGATIVE
Protein, UA: NEGATIVE
RBC UA: NEGATIVE
SPEC GRAV UA: 1.025
Urobilinogen, UA: 0.2
pH, UA: 6

## 2016-03-13 LAB — COMPREHENSIVE METABOLIC PANEL
ALT: 11 U/L (ref 0–35)
AST: 14 U/L (ref 0–37)
Albumin: 4.3 g/dL (ref 3.5–5.2)
Alkaline Phosphatase: 33 U/L — ABNORMAL LOW (ref 39–117)
BILIRUBIN TOTAL: 0.5 mg/dL (ref 0.2–1.2)
BUN: 17 mg/dL (ref 6–23)
CO2: 29 meq/L (ref 19–32)
Calcium: 9.4 mg/dL (ref 8.4–10.5)
Chloride: 104 mEq/L (ref 96–112)
Creatinine, Ser: 0.75 mg/dL (ref 0.40–1.20)
GFR: 81.62 mL/min (ref >60.00)
GLUCOSE: 109 mg/dL — AB (ref 70–99)
POTASSIUM: 3.7 meq/L (ref 3.5–5.1)
SODIUM: 141 meq/L (ref 135–145)
Total Protein: 7.1 g/dL (ref 6.0–8.3)

## 2016-03-13 LAB — LIPID PANEL
CHOL/HDL RATIO: 3
Cholesterol: 114 mg/dL (ref 0–200)
HDL: 44.4 mg/dL (ref >39.00)
LDL CALC: 46 mg/dL (ref 0–99)
NONHDL: 69.72
TRIGLYCERIDES: 120 mg/dL (ref 0.0–149.0)
VLDL: 24 mg/dL (ref 0.0–40.0)

## 2016-03-13 LAB — HEMOGLOBIN A1C: Hgb A1c MFr Bld: 6.6 % — ABNORMAL HIGH (ref 4.6–6.5)

## 2016-03-13 MED ORDER — ROSUVASTATIN CALCIUM 20 MG PO TABS: 20.0000 mg | ORAL_TABLET | Freq: Every day | ORAL | Status: DC

## 2016-03-13 MED ORDER — METFORMIN HCL ER (MOD) 500 MG PO TB24: 1000.0000 mg | ORAL_TABLET | Freq: Two times a day (BID) | ORAL | Status: DC

## 2016-03-13 MED ORDER — LOSARTAN POTASSIUM 25 MG PO TABS: 25.0000 mg | ORAL_TABLET | Freq: Every day | ORAL | Status: DC

## 2016-03-13 MED ORDER — ONETOUCH DELICA LANCETS FINE MISC: Status: DC

## 2016-03-13 MED ORDER — GLUCOSE BLOOD VI STRP: ORAL_STRIP | Status: DC

## 2016-03-13 NOTE — Patient Instructions (Signed)

## 2016-03-13 NOTE — Progress Notes (Signed)
[name], female    DOB: 21-Jul-1946  Age: 70 y.o. MRN: [id number]    Subjective:  Subjective HPI Cynthia Burns presents for f/u dm, cholesterol and bp.  Pt only complains of R arm/ shoulder pain after attempting to catch her moms little christmas tree.  It jerked her arm back and her bicep has been bothering her since.    HYPERTENSION  Blood pressure range--- not checking   Chest pain- no      Dyspnea- no Lightheadedness- no   Edema- no Other side effects - no   Medication compliance: good Low salt diet- yes  DIABETES  Blood Sugar ranges-100-129  Polyuria- no New Visual problems- no Hypoglycemic symptoms- no Other side effects-no Medication compliance - good Last eye exam- 04/2015 Foot exam- today  HYPERLIPIDEMIA  Medication compliance- good RUQ pain- no  Muscle aches- no Other side effects-no  Review of Systems Review of Systems  Constitutional: Negative for activity change, appetite change and fatigue.  HENT: Negative for hearing loss, congestion, tinnitus and ear discharge.  dentist q16m Eyes: Negative for visual disturbance (see optho q1y -- vision corrected to 20/20 with glasses).  Respiratory: Negative for cough, chest tightness and shortness of breath.   Cardiovascular: Negative for chest pain, palpitations and leg swelling.  Gastrointestinal: Negative for abdominal pain, diarrhea, constipation and abdominal distention.  Genitourinary: Negative for urgency, frequency, decreased urine volume and difficulty urinating.  Musculoskeletal: Negative for back pain, arthralgias and gait problem.  Skin: Negative for color change, pallor and rash.  Neurological: Negative for dizziness, light-headedness, numbness and headaches.  Hematological: Negative for adenopathy. Does not bruise/bleed easily.  Psychiatric/Behavioral: Negative for suicidal ideas, confusion, sleep disturbance, self-injury, dysphoric mood, decreased concentration and agitation.       History Past Medical History  Diagnosis Date  . Overweight(278.02)   . HYPERTENSION   . MITRAL VALVE PROLAPSE   . PHARYNGITIS, ACUTE   . OSTEOPENIA   . Diabetes mellitus without complication ([place])   . 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   Her family history includes Diabetes in her father; Emphysema in her father; Heart disease in her mother. There is no history of Colon cancer.She reports that she has never smoked. She has never used smokeless tobacco. She reports that she does not drink alcohol or use illicit drugs.  Current Outpatient Prescriptions on File Prior to Visit  Medication Sig Dispense Refill  . aspirin EC 81 MG tablet Take 1 tablet (81 mg total) by mouth daily. 90 tablet 3  . Biotin (PA BIOTIN) 1000 MCG tablet Take 1,000 mcg by mouth daily.    Marland Kitchen BYSTOLIC 5 MG tablet TAKE 1 TABLET DAILY 90 tablet 1  . Cyanocobalamin (VITAMIN B-12 PO) Take 1 tablet by mouth daily.     . furosemide (LASIX) 20 MG tablet Take 0.5 tablets (10 mg total) by mouth daily. 45 tablet 1  . meclizine (ANTIVERT) 25 MG tablet Take 1 tablet (25 mg total) by mouth as needed for dizziness. 30 tablet 5  . VITAMIN D, CHOLECALCIFEROL, PO Take 1 tablet by mouth daily.      No current facility-administered medications on file prior to visit.     Objective:  Objective Physical Exam  Constitutional: She is oriented to person, place, and time. She appears well-developed and well-nourished.  HENT:  Head: Normocephalic and atraumatic.  Eyes: Conjunctivae and EOM are normal.  Neck:  Normal range of motion. Neck supple. No JVD present. Carotid bruit is not present. No thyromegaly present.  Cardiovascular: Normal rate, regular rhythm and normal heart sounds.   No murmur heard. Pulmonary/Chest: Effort normal and breath sounds normal. No respiratory distress. She has no wheezes. She has no rales. She exhibits no tenderness.   Musculoskeletal: She exhibits no edema.  Neurological: She is alert and oriented to person, place, and time.  Psychiatric: She has a normal mood and affect. Her behavior is normal. Judgment and thought content normal.  Nursing note and vitals reviewed. Sensory exam of the foot is normal, tested with the monofilament. Good pulses, no lesions or ulcers, good peripheral pulses.  BP 138/81 mmHg  Pulse 58  Temp(Src) 97.9 F (36.6 C) (Oral)  Ht 5\' 2"  (1.575 m)  Wt 139 lb 3.2 oz (63.141 kg)  BMI 25.45 kg/m2  SpO2 99% Wt Readings from Last 3 Encounters:  03/13/16 139 lb 3.2 oz (63.141 kg)  12/26/15 141 lb 3.2 oz (64.048 kg)  11/26/15 137 lb 6.4 oz (62.324 kg)     Lab Results  Component Value Date   WBC 6.7 09/13/2015   HGB 13.1 09/13/2015   HCT 39.2 09/13/2015   PLT 248.0 09/13/2015   GLUCOSE 113* 09/13/2015   CHOL 119 09/13/2015   TRIG 109.0 09/13/2015   HDL 47.00 09/13/2015   LDLDIRECT 78.3 07/12/2013   LDLCALC 51 09/13/2015   ALT 15 09/13/2015   AST 18 09/13/2015   NA 139 09/13/2015   K 3.6 09/13/2015   CL 104 09/13/2015   CREATININE 0.74 09/13/2015   BUN 15 09/13/2015   CO2 29 09/13/2015   HGBA1C 6.4 09/13/2015   MICROALBUR 1.9 09/13/2015    Mm Diag Breast Tomo Uni Left  10/15/2015  CLINICAL DATA:  Possible small mass retroareolar left breast identified on recent 3D mammogram. EXAM: DIGITAL DIAGNOSTIC LEFT MAMMOGRAM WITH 3D TOMOSYNTHESIS AND CAD COMPARISON:  10/08/2015 and earlier priors ACR Breast Density Category b: There are scattered areas of fibroglandular density. FINDINGS: Focal spot compression views of the retroareolar left breast, with 3D tomographic technique, show no persistent mass or distortion. Findings on the recent screening mammogram are most consistent with a overlap of normal retroareolar ducts/fibroglandular tissue. Mammographic images were processed with CAD. IMPRESSION: No evidence of malignancy in the left breast. RECOMMENDATION: Screening  mammogram in one year.(Code:SM-B-01Y) I have discussed the findings and recommendations with the patient. Results were also provided in writing at the conclusion of the visit. If applicable, a reminder letter will be sent to the patient regarding the next appointment. BI-RADS CATEGORY  1: Negative. Electronically Signed   By: Curlene Dolphin M.D.   On: 10/15/2015 16:46     Assessment & Plan:  Plan I have discontinued Ms. Lafferty's azithromycin, benzonatate, fluticasone, and moxifloxacin. I have also changed her ONETOUCH DELICA LANCETS FINE, glucose blood, metFORMIN, losartan, and rosuvastatin. Additionally, I am having her maintain her aspirin EC, Cyanocobalamin (VITAMIN B-12 PO), (VITAMIN D, CHOLECALCIFEROL, PO), Biotin, meclizine, BYSTOLIC, and furosemide.  Meds ordered this encounter  Medications  . DISCONTD: glucose blood (ONE TOUCH ULTRA TEST) test strip    Sig: 1 each by Other route as needed for other. Use as instructed  . DISCONTD: ONETOUCH DELICA LANCETS FINE MISC    Sig: by Does not apply route.  Glory Rosebush DELICA LANCETS FINE MISC    Sig: Check Blood sugar once daily. Dx:E11.9    Dispense:  100 each    Refill:  12  .  glucose blood (ONE TOUCH ULTRA TEST) test strip    Sig: Check Blood sugar once daily. Dx:E11.9    Dispense:  100 each    Refill:  12  . metFORMIN (GLUMETZA) 500 MG (MOD) 24 hr tablet    Sig: Take 2 tablets (1,000 mg total) by mouth 2 (two) times daily with a meal.    Dispense:  90 tablet    Refill:  1  . losartan (COZAAR) 25 MG tablet    Sig: Take 1 tablet (25 mg total) by mouth daily.    Dispense:  90 tablet    Refill:  1  . rosuvastatin (CRESTOR) 20 MG tablet    Sig: Take 1 tablet (20 mg total) by mouth daily.    Dispense:  90 tablet    Refill:  1    Problem List Items Addressed This Visit      Unprioritized   Essential hypertension   Relevant Medications   losartan (COZAAR) 25 MG tablet   rosuvastatin (CRESTOR) 20 MG tablet   Other Relevant Orders    Comprehensive metabolic panel   Hemoglobin A1c   Lipid panel   POCT urinalysis dipstick    Other Visit Diagnoses    Controlled type 2 diabetes mellitus without complication, without long-term current use of insulin (HCC)    -  Primary    Relevant Medications    ONETOUCH DELICA LANCETS FINE MISC    glucose blood (ONE TOUCH ULTRA TEST) test strip    metFORMIN (GLUMETZA) 500 MG (MOD) 24 hr tablet    losartan (COZAAR) 25 MG tablet    rosuvastatin (CRESTOR) 20 MG tablet    Other Relevant Orders    Comprehensive metabolic panel    Hemoglobin A1c    Right arm pain        Relevant Orders    Ambulatory referral to Sports Medicine    Hyperlipidemia        Relevant Medications    losartan (COZAAR) 25 MG tablet    rosuvastatin (CRESTOR) 20 MG tablet    Other Relevant Orders    Comprehensive metabolic panel    Lipid panel    POCT urinalysis dipstick       Follow-up: Return in about 6 months (around 09/13/2016), or if symptoms worsen or fail to improve, for annual exam, fasting.  Garnet Koyanagi, DO

## 2016-03-13 NOTE — Progress Notes (Signed)
Pre visit review using our clinic review tool, if applicable. No additional management support is needed unless otherwise documented below in the visit note. 

## 2016-03-14 ENCOUNTER — Ambulatory Visit: Payer: Medicare Other | Admitting: Family Medicine

## 2016-03-16 ENCOUNTER — Other Ambulatory Visit: Payer: Self-pay | Admitting: Adult Health

## 2016-03-17 ENCOUNTER — Ambulatory Visit (INDEPENDENT_AMBULATORY_CARE_PROVIDER_SITE_OTHER): Payer: Medicare Other | Admitting: Family Medicine

## 2016-03-17 ENCOUNTER — Encounter (INDEPENDENT_AMBULATORY_CARE_PROVIDER_SITE_OTHER): Payer: Self-pay

## 2016-03-17 ENCOUNTER — Encounter: Payer: Self-pay | Admitting: Family Medicine

## 2016-03-17 VITALS — BP 134/83 | HR 73 | Ht 62.0 in | Wt 135.0 lb

## 2016-03-17 DIAGNOSIS — S46111A Strain of muscle, fascia and tendon of long head of biceps, right arm, initial encounter: Secondary | ICD-10-CM | POA: Diagnosis not present

## 2016-03-17 DIAGNOSIS — M25511 Pain in right shoulder: Secondary | ICD-10-CM | POA: Diagnosis not present

## 2016-03-17 DIAGNOSIS — S46211A Strain of muscle, fascia and tendon of other parts of biceps, right arm, initial encounter: Secondary | ICD-10-CM

## 2016-03-17 NOTE — Assessment & Plan Note (Signed)
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  Shown home exercises to do daily.  Advised against injection, nitro patches at this time.  Tylenol, advil if needed.  Consider physical therapy.  F/u in 6 weeks or as needed.

## 2016-03-17 NOTE — Assessment & Plan Note (Signed)
presenting today for pain in bicep from injury in December but exam is very reassuring.  Biceps tendons intact.  Good strength and no tenderness today.  Shown home exercises to do daily.  Tylenol, advil if needed.  Heat/ice discussed.

## 2016-03-17 NOTE — Progress Notes (Addendum)
PCP and consultation requested by: Garnet Koyanagi, DO  Subjective:   HPI: Patient is a 70 y.o. female here for right arm pain.  Patient reports pain in right upper arm started around christmas 3 months ago - a small tree was falling off a table and she reached back with right arm to catch it. Felt a sharp pain in bicep area. No swelling or bruising. Pain was 10/10 initially - has improved and down to 4/10, more dull. Taking advil, tylenol with benefit. Pain can be sharp. 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 Pain worse with overhead reaching, pushing. Worse at end of work day too. No skin changes, fever.  Past Medical History  Diagnosis Date  . Overweight(278.02)   . HYPERTENSION   . MITRAL VALVE PROLAPSE   . PHARYNGITIS, ACUTE   . OSTEOPENIA   . Diabetes mellitus without complication ([place])   . 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 aspirin EC 81 MG tablet Take 1 tablet (81 mg total) by mouth daily. 90 tablet 3  . Biotin (PA BIOTIN) 1000 MCG tablet Take 1,000 mcg by mouth daily.    Marland Kitchen BYSTOLIC 5 MG tablet TAKE 1 TABLET DAILY 90 tablet 1  . Cyanocobalamin (VITAMIN B-12 PO) Take 1 tablet by mouth daily.     . furosemide (LASIX) 20 MG tablet Take 0.5 tablets (10 mg total) by mouth daily. 45 tablet 1  . glucose blood (ONE TOUCH ULTRA TEST) test strip Check Blood sugar once daily. Dx:E11.9 100 each 12  . losartan (COZAAR) 25 MG tablet Take 1 tablet (25 mg total) by mouth daily. 90 tablet 1  . meclizine (ANTIVERT) 25 MG tablet Take 1 tablet (25 mg total) by mouth as needed for dizziness. 30 tablet 5  . metFORMIN (GLUMETZA) 500 MG (MOD) 24 hr tablet Take 2 tablets (1,000 mg total) by mouth 2 (two) times daily with a meal. 90 tablet 1  . ONETOUCH DELICA LANCETS FINE MISC Check Blood  sugar once daily. Dx:E11.9 100 each 12  . rosuvastatin (CRESTOR) 20 MG tablet Take 1 tablet (20 mg total) by mouth daily. 90 tablet 1  . VITAMIN D, CHOLECALCIFEROL, PO Take 1 tablet by mouth daily.      No current facility-administered medications on file prior to visit.    Past Surgical History  Procedure Laterality Date  . Abdominal hysterectomy      Allergies  Allergen Reactions  . Mucinex [Guaifenesin Er] Anaphylaxis  . Codeine Nausea And Vomiting  . Advicor [Niacin-Lovastatin Er] Rash  . Amlodipine Rash  . Lisinopril-Hydrochlorothiazide Rash  . Other Rash    Eye drops with preservatives.    . Ramipril Rash  . Sulfonamide Derivatives Rash    Social History   Social History  . Marital Status: Married    Spouse Name: N/A  . Number of Children: N/A  . Years of Education: N/A   Occupational History  . Not on file.   Social History Main Topics  . Smoking status: Never Smoker   . Smokeless tobacco: Never Used  . Alcohol Use: No  . Drug Use: No  . Sexual Activity: Not on file   Other Topics Concern  . Not on file   Social History Narrative    Family History  Problem  Relation Age of Onset  . Emphysema Father   . Diabetes Father   . Heart disease Mother     Had a stent  . Hypertension    . Colon cancer Neg Hx     BP 134/83 mmHg  Pulse 73  Ht 5\' 2"  (1.575 m)  Wt 135 lb (61.236 kg)  BMI 24.69 kg/m2  Review of Systems: See HPI above.    Objective:  Physical Exam:  Gen: NAD, comfortable in exam room  Right shoulder: No swelling, ecchymoses.  No gross deformity. No TTP currently . FROM without painful arc Negative Hawkins, Neers. Negative Speeds, Yergasons. Negative hook of biceps tendon distally. Strength 5/5 with empty can and resisted internal/external rotation.  Mild pain empty can and ER. Negative apprehension. NV intact distally.    Assessment & Plan:  1. Right biceps strain - presenting today for pain in bicep from injury in December  but exam is very reassuring.  Biceps tendons intact.  Good strength and no tenderness today.  Shown home exercises to do daily.  Tylenol, advil if needed.  Heat/ice discussed.  2. 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  Shown home exercises to do daily.  Advised against injection, nitro patches at this time.  Tylenol, advil if needed.  Consider physical therapy.  F/u in 6 weeks or as needed.  Note for both issues patient was seen out of medical necessity.

## 2016-03-17 NOTE — Patient Instructions (Signed)
You strained your right biceps. Use tylenol as your first line medicine 1-2 tabs three times a day as needed - advil if you're still hurting after taking this. Heat or ice (whichever feels better) 15 minutes at a time as needed. Bicep curls, hammer rotation exercises 3 sets of 10 once a day for next 6 weeks.  You have rotator cuff impingement/strain Try to avoid painful activities (overhead activities, lifting with extended arm) as much as possible. Subacromial injection may be beneficial to help with pain and to decrease inflammation if pain worsens. Consider physical therapy with transition to home exercise program. Do home exercise program with theraband and scapular stabilization exercises daily - these are very important for long term relief even if an injection was given.  3 sets of 10 once a day. If not improving at follow-up we will consider further imaging, injection, physical therapy, and/or nitro patches.  If your thumb pain (dequervain's tenosynovitis) returns let me know. Bracing, injection are options here.

## 2016-03-18 ENCOUNTER — Encounter: Payer: Self-pay | Admitting: Family Medicine

## 2016-03-18 ENCOUNTER — Telehealth: Payer: Self-pay | Admitting: Family Medicine

## 2016-03-18 NOTE — Telephone Encounter (Signed)
Attempted to contact patient on 03/18/2016 to inform her of the change in schedule and the need to reschedule her appointment that was scheduled for 09/16/2016 with Dr. Carollee Herter. Appointment cancelled and letter mailed to patient asking her to call the office to reschedule

## 2016-03-31 ENCOUNTER — Telehealth: Payer: Self-pay | Admitting: Family Medicine

## 2016-03-31 MED ORDER — METFORMIN HCL 500 MG PO TABS: 500.0000 mg | ORAL_TABLET | Freq: Two times a day (BID) | ORAL | Status: DC

## 2016-03-31 NOTE — Telephone Encounter (Signed)
Please advise if it ok to change.     KP

## 2016-03-31 NOTE — Telephone Encounter (Signed)
Caller name: Self  Can be reached: [phone]  Pharmacy:  [facility], [place] [phone] (Phone) [phone] (Fax)         Reason for call: Patient states that her pharmacy does not carry Glumetza but does carry Metformin. Needs Metformin called in to this pharmacy

## 2016-03-31 NOTE — Telephone Encounter (Signed)
Rx has been faxed.    KP 

## 2016-03-31 NOTE — Telephone Encounter (Signed)
Metformin 500 mg 1 po bid #60

## 2016-04-14 NOTE — Telephone Encounter (Signed)
Please advise if this change is appropriate?    KP

## 2016-04-14 NOTE — Telephone Encounter (Addendum)
Relation to [id number] Call back number: [phone] Pharmacy: [facility], [address]   Reason for call:  Patient received metFORMIN (GLUCOPHAGE) 500 MG tablet today and states it should be "HCL ER" 4x daily. Please advise

## 2016-04-14 NOTE — Telephone Encounter (Signed)
Metformin er 500mg  2 po bid ------

## 2016-04-16 MED ORDER — METFORMIN HCL ER 500 MG PO TB24: 1000.0000 mg | ORAL_TABLET | Freq: Two times a day (BID) | ORAL | Status: DC

## 2016-04-16 NOTE — Addendum Note (Signed)
Addended by: Ewing Schlein on: 04/16/2016 01:40 PM   Modules accepted: Orders

## 2016-04-17 ENCOUNTER — Ambulatory Visit (HOSPITAL_COMMUNITY)
Admission: EM | Admit: 2016-04-17 | Discharge: 2016-04-17 | Disposition: A | Discharge status: Home / Self Care | Payer: Medicare Other | Attending: Emergency Medicine | Admitting: Emergency Medicine

## 2016-04-17 ENCOUNTER — Encounter (HOSPITAL_COMMUNITY): Payer: Self-pay | Admitting: Emergency Medicine

## 2016-04-17 ENCOUNTER — Telehealth: Payer: Self-pay | Admitting: Family Medicine

## 2016-04-17 DIAGNOSIS — T300 Burn of unspecified body region, unspecified degree: Secondary | ICD-10-CM

## 2016-04-17 DIAGNOSIS — T887XXA Unspecified adverse effect of drug or medicament, initial encounter: Secondary | ICD-10-CM

## 2016-04-17 DIAGNOSIS — T50905A Adverse effect of unspecified drugs, medicaments and biological substances, initial encounter: Secondary | ICD-10-CM

## 2016-04-17 MED ORDER — MUPIROCIN 2 % EX OINT: 1.0000 "application " | TOPICAL_OINTMENT | Freq: Two times a day (BID) | CUTANEOUS | Status: DC

## 2016-04-17 NOTE — ED Provider Notes (Signed)
CSN: [id number]     Arrival date & time 04/17/16  1615 History   First MD Initiated Contact with Patient 04/17/16 1825     Chief Complaint  Patient presents with  . Burn   (Consider location/radiation/quality/duration/timing/severity/associated sxs/prior Treatment) HPI History obtained from patient: Location: right forearm   Context/Duration: 2-3 days ago, curling iron stuck arm  Severity: 2  Quality: itchy Timing:      constant      Home Treatment: neosporin Associated symptoms:  Redness, now blisters Family History: father with DM, no history of major burns Social History: non smoker, no etoh  Past Medical History  Diagnosis Date  . Overweight(278.02)   . HYPERTENSION   . MITRAL VALVE PROLAPSE   . PHARYNGITIS, ACUTE   . OSTEOPENIA   . Diabetes mellitus without complication ([place])   . 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 Abdominal hysterectomy     Family History  Problem Relation Age of Onset  . Emphysema Father   . Diabetes Father   . Heart disease Mother     Had a stent  . Hypertension    . Colon cancer Neg Hx    Social History  Substance Use Topics  . Smoking status: Never Smoker   . Smokeless tobacco: Never Used  . Alcohol Use: No   OB History    No data available     Review of Systems ROS +'ve burn to right arm  Denies: HEADACHE, NAUSEA, ABDOMINAL PAIN, CHEST PAIN, CONGESTION, DYSURIA, SHORTNESS OF BREATH  Allergies  Mucinex; Codeine; Advicor; Amlodipine; Lisinopril-hydrochlorothiazide; Other; Ramipril; and Sulfonamide derivatives  Home Medications   Prior to Admission medications   Medication Sig Start Date End Date Taking? Authorizing Provider  aspirin EC 81 MG tablet Take 1 tablet (81 mg total) by mouth daily. 11/23/12  Yes Josue Hector, MD  Biotin (PA BIOTIN) 1000 MCG tablet Take 1,000 mcg by mouth daily.   Yes Historical Provider, MD  BYSTOLIC 5 MG tablet TAKE 1 TABLET DAILY  12/10/15  Yes Josue Hector, MD  Cyanocobalamin (VITAMIN B-12 PO) Take 1 tablet by mouth daily.    Yes Historical Provider, MD  furosemide (LASIX) 20 MG tablet Take 0.5 tablets (10 mg total) by mouth daily. 02/28/16  Yes Josue Hector, MD  furosemide (LASIX) 20 MG tablet TAKE ONE-HALF (1/2) TABLET DAILY 03/18/16  Yes Josue Hector, MD  glucose blood (ONE TOUCH ULTRA TEST) test strip Check Blood sugar once daily. Dx:E11.9 03/13/16  Yes Yvonne R Lowne Chase, DO  losartan (COZAAR) 25 MG tablet Take 1 tablet (25 mg total) by mouth daily. 03/13/16  Yes Yvonne R Lowne Chase, DO  meclizine (ANTIVERT) 25 MG tablet Take 1 tablet (25 mg total) by mouth as needed for dizziness. 03/15/15  Yes Yvonne R Lowne Chase, DO  metFORMIN (GLUCOPHAGE-XR) 500 MG 24 hr tablet Take 2 tablets (1,000 mg total) by mouth 2 (two) times daily. 04/16/16  Yes Yvonne R Lowne Chase, DO  ONETOUCH DELICA LANCETS FINE MISC Check Blood sugar once daily. Dx:E11.9 03/13/16  Yes Yvonne R Lowne Chase, DO  rosuvastatin (CRESTOR) 20 MG tablet Take 1 tablet (20 mg total) by mouth daily. 03/13/16  Yes Yvonne R Lowne Chase, DO  VITAMIN D, CHOLECALCIFEROL, PO Take 1 tablet by mouth daily.    Yes Historical Provider, MD  mupirocin ointment (BACTROBAN) 2 % Place 1 application into the nose 2 (two) times  daily. 04/17/16   Konrad Felix, PA   Meds Ordered and Administered this Visit  Medications - No data to display  BP 165/77 mmHg  Pulse 67  Temp(Src) 98.2 F (36.8 C) (Oral)  Resp 16  SpO2 99% No data found.   Physical Exam NURSES NOTES AND VITAL SIGNS REVIEWED. CONSTITUTIONAL: Well developed, well nourished, no acute distress HEENT: normocephalic, atraumatic EYES: Conjunctiva normal NECK:normal ROM, supple, no adenopathy PULMONARY:No respiratory distress, normal effort ABDOMINAL: Soft, ND, NT BS+, No CVAT MUSCULOSKELETAL: Normal ROM of all extremities, right forearm is a 2nd degree burn 2 cm with some redness around immediate borders, but  no sign of cellulitis, there are a few small blisters noted around the margin of the burn.  SKIN: warm and dry without rash PSYCHIATRIC: Mood and affect, behavior are normal  ED Course  Procedures (including critical care time)  Labs Review Labs Reviewed - No data to display  Imaging Review No results found.   Visual Acuity Review  Right Eye Distance:   Left Eye Distance:   Bilateral Distance:    Right Eye Near:   Left Eye Near:    Bilateral Near:     Neosporin can cause blisters  RX bactroban  MDM   1. Burn   2. Medication reaction, initial encounter     Patient is reassured that there are no issues that require transfer to higher level of care at this time or additional tests. Patient is advised to continue home symptomatic treatment. Patient is advised that if there are new or worsening symptoms to attend the emergency department, contact primary care provider, or return to UC. Instructions of care provided discharged home in stable condition.    THIS NOTE WAS GENERATED USING A VOICE RECOGNITION SOFTWARE PROGRAM. ALL REASONABLE EFFORTS  WERE MADE TO PROOFREAD THIS DOCUMENT FOR ACCURACY.  I have verbally reviewed the discharge instructions with the patient. A printed AVS was given to the patient.  All questions were answered prior to discharge.      Konrad Felix, [place] 04/18/16 [phone]

## 2016-04-17 NOTE — Discharge Instructions (Signed)
Burn Care °Burns hurt your skin. When your skin is hurt, it is easier to get an infection. Follow your doctor's directions to help prevent an infection. °HOME CARE °· Wash your hands well before you change your bandage. °· Change your bandage as often as told by your doctor. °¨ Remove the old bandage. If the bandage sticks, soak it off with cool, clean water. °¨ Gently clean the burn with mild soap and water. °¨ Pat the burn dry with a clean, dry cloth. °¨ Put a thin layer of medicated cream on the burn. °¨ Put a clean bandage on as told by your doctor. °¨ Keep the bandage clean and dry. °· Raise (elevate) the burn for the first 24 hours. After that, follow your doctor's directions. °· Only take medicine as told by your doctor. °GET HELP RIGHT AWAY IF:  °· You have too much pain. °· The skin near the burn is red, tender, puffy (swollen), or has red streaks. °· The burn area has yellowish white fluid (pus) or a bad smell coming from it. °· You have a fever. °MAKE SURE YOU:  °· Understand these instructions. °· Will watch your condition. °· Will get help right away if you are not doing well or get worse. °  °This information is not intended to replace advice given to you by your health care provider. Make sure you discuss any questions you have with your health care provider. °  °Document Released: 09/16/2008 Document Revised: 03/01/2012 Document Reviewed: 04/30/2011 °Elsevier Interactive Patient Education ©2016 [employer] ° °

## 2016-04-17 NOTE — ED Notes (Signed)
Patient c/o burn on right forearm x 6 days. Patient reports she has been using neosporin on it to keep it covered. Patient reports that today it has been blistering and she became concerned. It is not painful. Still red and inflamed. Patient is in NAD.

## 2016-04-17 NOTE — Telephone Encounter (Signed)
Patient is aware the new medication has been faxed.   KP

## 2016-04-17 NOTE — Telephone Encounter (Signed)
Patient called @ 3:40 stating that she had burnt her arm a few days ago. She is diabetic. Stated that today it is red and has bumps around it. After discussion with Ashleigh, informed patient that she needed to be seen. Patient wanted appointment for tomorrow but none were available in this office. Offered her an appointment at [facility] with Dr. Birdie Riddle, patient declined and stated that she will go to Urgent Care @ [facility].

## 2016-04-29 ENCOUNTER — Telehealth: Payer: Self-pay | Admitting: Family Medicine

## 2016-04-29 NOTE — Telephone Encounter (Signed)
Pt says that she is returning a call. Pt says no vm was left. Not showing a note in chart. While on the phone pt stated that she still has only received half (180 tablets) of her Rx for Metformin.   Pt would like CMA to check the status of her Rx.   Please advise

## 2016-04-30 NOTE — Telephone Encounter (Signed)
I called [facility] and made the patient aware that the medication has been shipped on 04/25/16 with a quantity of 360. The patient has not received that prescription yet, but will be expecting it.     KP

## 2016-05-12 DIAGNOSIS — H10413 Chronic giant papillary conjunctivitis, bilateral: Secondary | ICD-10-CM | POA: Diagnosis not present

## 2016-05-12 DIAGNOSIS — H25813 Combined forms of age-related cataract, bilateral: Secondary | ICD-10-CM | POA: Diagnosis not present

## 2016-05-12 DIAGNOSIS — E119 Type 2 diabetes mellitus without complications: Secondary | ICD-10-CM | POA: Diagnosis not present

## 2016-05-12 DIAGNOSIS — Z0100 Encounter for examination of eyes and vision without abnormal findings: Secondary | ICD-10-CM | POA: Diagnosis not present

## 2016-05-12 LAB — HM DIABETES EYE EXAM

## 2016-05-20 ENCOUNTER — Encounter: Payer: Self-pay | Admitting: Family Medicine

## 2016-05-22 ENCOUNTER — Encounter: Payer: Self-pay | Admitting: Cardiovascular Disease

## 2016-05-22 ENCOUNTER — Ambulatory Visit (INDEPENDENT_AMBULATORY_CARE_PROVIDER_SITE_OTHER): Payer: Medicare Other | Admitting: Cardiovascular Disease

## 2016-05-22 ENCOUNTER — Ambulatory Visit: Payer: Medicare Other | Admitting: Cardiovascular Disease

## 2016-05-22 VITALS — BP 130/78 | HR 65 | Ht 62.0 in | Wt 140.1 lb

## 2016-05-22 DIAGNOSIS — E785 Hyperlipidemia, unspecified: Secondary | ICD-10-CM | POA: Diagnosis not present

## 2016-05-22 DIAGNOSIS — I10 Essential (primary) hypertension: Secondary | ICD-10-CM

## 2016-05-22 MED ORDER — NEBIVOLOL HCL 5 MG PO TABS: 5.0000 mg | ORAL_TABLET | Freq: Every day | ORAL | Status: DC

## 2016-05-22 MED ORDER — FUROSEMIDE 20 MG PO TABS: 10.0000 mg | ORAL_TABLET | Freq: Every day | ORAL | Status: DC

## 2016-05-22 MED ORDER — LOSARTAN POTASSIUM 25 MG PO TABS: 25.0000 mg | ORAL_TABLET | Freq: Every day | ORAL | Status: DC

## 2016-05-22 MED ORDER — ROSUVASTATIN CALCIUM 20 MG PO TABS: 20.0000 mg | ORAL_TABLET | Freq: Every day | ORAL | Status: DC

## 2016-05-22 NOTE — Progress Notes (Signed)
[name], female   DOB: [birth year], 70 y.o.   MRN: [id number] Cynthia Burns returns today for followup. 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 Unfortunately she continues to be significant he overweight. We talked about this at length. He needs to assume a low carbohydrate diet. Lot of her social activities seem to revolve around food. She does have a treadmill at home but is sedentary. I talked to her about an exercise prescription including a heart rate of 130 beats per minute as the target. She is on diastolic but I think [id number] beats per minute his achievable given her current deconditioned state. 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 The non-coronary sinus of Valsalva measure 2.5 cm vertically along the axis of the aorta or an approximately 1.2 cm transvers She has no significant aortic stenosis or insufficiency and her valve is trileaflet. Last echo in January of 09 showed no significant progression.   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 Systolic function was  normal. The estimated ejection fraction was in the range of 60%  to 65%. Wall motion was normal; there were no regional wall  motion abnormalities. Doppler parameters are consistent with  abnormal left ventricular relaxation (grade 1 diastolic  dysfunction). There was no evidence of elevated ventricular  filling pressure by Doppler parameters. - Aortic valve: Trileaflet; normal thickness leaflets.  There was no  regurgitation. - Aortic root: The aortic root was normal in size. - Ascending aorta: The ascending aorta was normal in size. - Mitral valve: Structurally normal valve. There was no  regurgitation. - Left atrium: The atrium was normal in size. - Right ventricle: Systolic function was normal. - Right atrium: The atrium was normal in size. - Tricuspid valve: There was trivial regurgitation. - Pulmonic valve: There was no regurgitation. - Pulmonary arteries: Systolic pressure was within the normal  range. - Pericardium, extracardiac: There was no pericardial effusion.  Impressions:  - Aortic annular diameter is stable at 38 mm (normal value).  ROS: Denies fever, malais, weight loss, blurry vision, decreased visual acuity, cough, sputum, SOB, hemoptysis, pleuritic pain, palpitaitons, heartburn, abdominal pain, melena, lower extremity edema, claudication, or rash.  All other systems reviewed and negative  General: Affect appropriate Healthy:  appears stated age 20: normal Neck supple with no adenopathy JVP normal no bruits no thyromegaly Lungs clear with no wheezing and good diaphragmatic motion Heart:  S1/S2 no murmur, no rub, gallop or click PMI normal Abdomen: benighn, BS positve, no tenderness, no AAA no bruit.  No HSM or HJR Distal pulses intact with no bruits No edema Neuro non-focal Skin warm and dry No muscular weakness   Current Outpatient Prescriptions  Medication Sig Dispense Refill  . aspirin EC 81 MG tablet Take 1 tablet (81 mg total) by mouth daily. 90 tablet 3  . Biotin (PA BIOTIN) 1000 MCG tablet Take 1,000 mcg by mouth daily.    Marland Kitchen BYSTOLIC 5 MG tablet TAKE 1  TABLET DAILY 90 tablet 1  . Cyanocobalamin (VITAMIN B-12 PO) Take 1 tablet by mouth daily.     . furosemide (LASIX) 20 MG tablet Take 0.5 tablets (10 mg total) by mouth daily. 45 tablet 1  . losartan (COZAAR) 25 MG tablet Take 1 tablet (25 mg total) by mouth daily. 90 tablet 1  .  meclizine (ANTIVERT) 25 MG tablet Take 1 tablet (25 mg total) by mouth as needed for dizziness. 30 tablet 5  . metFORMIN (GLUCOPHAGE-XR) 500 MG 24 hr tablet Take 2 tablets (1,000 mg total) by mouth 2 (two) times daily. 360 tablet 1  . mupirocin ointment (BACTROBAN) 2 % Place 1 application into the nose 2 (two) times daily. 22 g 0  . rosuvastatin (CRESTOR) 20 MG tablet Take 20 mg by mouth daily.    Marland Kitchen VITAMIN D, CHOLECALCIFEROL, PO Take 1 tablet by mouth daily.      No current facility-administered medications for this visit.    Allergies  Mucinex; Codeine; Advicor; Amlodipine; Lisinopril-hydrochlorothiazide; Other; Ramipril; and Sulfonamide derivatives  Electrocardiogram:  11/22/14  SR rate 72  Low voltage no change from 2014  11/26/15  SR rate 72  Normal low voltage  Assessment and Plan SVA: F/U echo in 6 months stable  DM: Discussed low carb diet.  Target hemoglobin A1c is 6.5 or less.  Continue current medications. Obesity: Exercise and low carb diet discussed Chol:  Lab Results  Component Value Date   LDLCALC 46 03/13/2016   HTN:  Well controlled.  Continue current medications and low sodium Dash type diet.   Edema: stable can consider adding diuretic in future if needed   [employer]

## 2016-05-22 NOTE — Patient Instructions (Signed)
Medication Instructions:  Your physician recommends that you continue on your current medications as directed. Please refer to the Current Medication list given to you today.  Labwork: NONE  Testing/Procedures: Your physician has requested that you have an echocardiogram 6 months with office visit. Echocardiography is a painless test that uses sound waves to create images of your heart. It provides your doctor with information about the size and shape of your heart and how well your heart's chambers and valves are working. This procedure takes approximately one hour. There are no restrictions for this procedure.  Follow-Up: Your physician wants you to follow-up in: 6 months with Dr. Johnsie Cancel. You will receive a reminder letter in the mail two months in advance. If you don't receive a letter, please call our office to schedule the follow-up appointment.   If you need a refill on your cardiac medications before your next appointment, please call your pharmacy.

## 2016-06-16 ENCOUNTER — Other Ambulatory Visit: Payer: Self-pay | Admitting: Cardiovascular Disease

## 2016-06-17 NOTE — Telephone Encounter (Signed)
furosemide (LASIX) 20 MG tablet  Medication   Date: 05/22/2016  Department: [address] Office  Ordering/Authorizing: Josue Hector, MD      Order Providers    Prescribing Provider Encounter Provider   Josue Hector, MD Josue Hector, MD    Medication Detail      Disp Refills Start End     furosemide (LASIX) 20 MG tablet 45 tablet 3 05/22/2016     Sig - Route: Take 0.5 tablets (10 mg total) by mouth daily. - Oral    E-Prescribing Status: Receipt confirmed by pharmacy (05/22/2016 9:36 AM EDT)     [place], [place]

## 2016-09-01 ENCOUNTER — Encounter: Payer: Self-pay | Admitting: Family Medicine

## 2016-09-01 ENCOUNTER — Ambulatory Visit (INDEPENDENT_AMBULATORY_CARE_PROVIDER_SITE_OTHER): Payer: Medicare Other | Admitting: Family Medicine

## 2016-09-01 VITALS — BP 138/72 | HR 74 | Temp 98.2°F | Resp 17 | Ht 62.0 in | Wt 146.0 lb

## 2016-09-01 DIAGNOSIS — R1010 Upper abdominal pain, unspecified: Secondary | ICD-10-CM

## 2016-09-01 DIAGNOSIS — R1013 Epigastric pain: Secondary | ICD-10-CM

## 2016-09-01 DIAGNOSIS — Z23 Encounter for immunization: Secondary | ICD-10-CM

## 2016-09-01 MED ORDER — OMEPRAZOLE 40 MG PO CPDR
40.0000 mg | DELAYED_RELEASE_CAPSULE | Freq: Every day | ORAL | 3 refills | Status: DC
Start: 2016-09-01 — End: 2017-10-26

## 2016-09-01 NOTE — Progress Notes (Signed)
Pre visit review using our clinic review tool, if applicable. No additional management support is needed unless otherwise documented below in the visit note. 

## 2016-09-01 NOTE — Patient Instructions (Signed)
Food Choices for Gastroesophageal Reflux Disease, Adult When you have gastroesophageal reflux disease (GERD), the foods you eat and your eating habits are very important. Choosing the right foods can help ease the discomfort of GERD. WHAT GENERAL GUIDELINES DO I NEED TO FOLLOW?  Choose fruits, vegetables, whole grains, low-fat dairy products, and low-fat meat, fish, and poultry.  Limit fats such as oils, salad dressings, butter, nuts, and avocado.  Keep a food diary to identify foods that cause symptoms.  Avoid foods that cause reflux. These may be different for different people.  Eat frequent small meals instead of three large meals each day.  Eat your meals slowly, in a relaxed setting.  Limit fried foods.  Cook foods using methods other than frying.  Avoid drinking alcohol.  Avoid drinking large amounts of liquids with your meals.  Avoid bending over or lying down until 2-3 hours after eating. WHAT FOODS ARE NOT RECOMMENDED? The following are some foods and drinks that may worsen your symptoms: Vegetables Tomatoes. Tomato juice. Tomato and spaghetti sauce. Chili peppers. Onion and garlic. Horseradish. Fruits Oranges, grapefruit, and lemon (fruit and juice). Meats High-fat meats, fish, and poultry. This includes hot dogs, ribs, ham, sausage, salami, and bacon. Dairy Whole milk and chocolate milk. Sour cream. Cream. Butter. Ice cream. Cream cheese.  Beverages Coffee and tea, with or without caffeine. Carbonated beverages or energy drinks. Condiments Hot sauce. Barbecue sauce.  Sweets/Desserts Chocolate and cocoa. Donuts. Peppermint and spearmint. Fats and Oils High-fat foods, including French fries and potato chips. Other Vinegar. Strong spices, such as black pepper, white pepper, red pepper, cayenne, curry powder, cloves, ginger, and chili powder. The items listed above may not be a complete list of foods and beverages to avoid. Contact your dietitian for more  information.   This information is not intended to replace advice given to you by your health care provider. Make sure you discuss any questions you have with your health care provider.   Document Released: 12/08/2005 Document Revised: 12/29/2014 Document Reviewed: 10/12/2013 Elsevier Interactive Patient Education 2016 Elsevier Inc.  

## 2016-09-01 NOTE — Progress Notes (Signed)
[name], female    DOB: [birth year]  Age: 70 y.o. MRN: [id number]    Subjective:  Subjective  HPI Cynthia Burns presents for c/o midepigastric pain after eating spicy foods and hamburger.  She took tums and tylenol and it did not go away until this am.    Review of Systems  Constitutional: Negative for activity change, appetite change, fatigue and unexpected weight change.  Respiratory: Negative for cough and shortness of breath.   Cardiovascular: Negative for chest pain and palpitations.  Gastrointestinal: Positive for abdominal pain. Negative for constipation, diarrhea, nausea and vomiting.  Psychiatric/Behavioral: Negative for behavioral problems and dysphoric mood. The patient is not nervous/anxious.     History Past Medical History:  Diagnosis Date  . Diabetes mellitus without complication ([place])   . 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 HYPERTENSION   . MITRAL VALVE PROLAPSE   . OSTEOPENIA   . Overweight(278.02)   . PHARYNGITIS, ACUTE     She has a past surgical history that includes Abdominal hysterectomy.   Her family history includes Diabetes in her father; Emphysema in her father; Heart disease in her mother.She reports that she has never smoked. She has never used smokeless tobacco. She reports that she does not drink alcohol or use drugs.  Current Outpatient Prescriptions on File Prior to Visit  Medication Sig Dispense Refill  . aspirin EC 81 MG tablet Take 1 tablet (81 mg total) by mouth daily. 90 tablet 3  . Biotin (PA BIOTIN) 1000 MCG tablet Take 1,000 mcg by mouth daily.    . Cyanocobalamin (VITAMIN B-12 PO) Take 1 tablet by mouth daily.     . furosemide (LASIX) 20 MG tablet Take 0.5 tablets (10 mg total) by mouth daily. 45 tablet 3  . losartan (COZAAR) 25 MG tablet Take 1 tablet (25 mg total) by mouth daily. 90 tablet 3  . meclizine (ANTIVERT) 25 MG tablet Take 1 tablet (25 mg total) by mouth as needed for dizziness. 30 tablet 5  . metFORMIN  (GLUCOPHAGE-XR) 500 MG 24 hr tablet Take 2 tablets (1,000 mg total) by mouth 2 (two) times daily. 360 tablet 1  . mupirocin ointment (BACTROBAN) 2 % Place 1 application into the nose 2 (two) times daily. 22 g 0  . nebivolol (BYSTOLIC) 5 MG tablet Take 1 tablet (5 mg total) by mouth daily. 90 tablet 3  . rosuvastatin (CRESTOR) 20 MG tablet Take 20 mg by mouth daily.    Marland Kitchen VITAMIN D, CHOLECALCIFEROL, PO Take 1 tablet by mouth daily.      No current facility-administered medications on file prior to visit.      Objective:  Objective  Physical Exam  Constitutional: She is oriented to person, place, and time. She appears well-developed and well-nourished.  HENT:  Head: Normocephalic and atraumatic.  Eyes: Conjunctivae and EOM are normal.  Neck: Normal range of motion. Neck supple. No JVD present. Carotid bruit is not present. No thyromegaly present.  Cardiovascular: Normal rate, regular rhythm and normal heart sounds.   No murmur heard. Pulmonary/Chest: Effort normal and breath sounds normal. No respiratory distress. She has no wheezes. She has no rales. She exhibits no tenderness.  Abdominal: She exhibits no distension and no mass. There is tenderness in the epigastric area. There is no rebound and no guarding.  Musculoskeletal: She exhibits no edema.  Neurological: She is alert and oriented to person, place, and time.  Psychiatric: She has a normal mood and  affect.  Nursing note and vitals reviewed.  BP 138/72 (BP Location: Right Arm, Patient Position: Sitting, Cuff Size: Normal)   Pulse 74   Temp 98.2 F (36.8 C) (Oral)   Resp 17   Ht 5\' 2"  (1.575 m)   Wt 146 lb (66.2 kg)   SpO2 93%   BMI 26.70 kg/m  Wt Readings from Last 3 Encounters:  09/01/16 146 lb (66.2 kg)  05/22/16 140 lb 1.9 oz (63.6 kg)  03/17/16 135 lb (61.2 kg)     Lab Results  Component Value Date   WBC 12.6 (H) 09/01/2016   HGB 13.0 09/01/2016   HCT 38.2 09/01/2016   PLT 269.0 09/01/2016   GLUCOSE 114 (H)  09/01/2016   CHOL 114 03/13/2016   TRIG 120.0 03/13/2016   HDL 44.40 03/13/2016   LDLDIRECT 78.3 07/12/2013   LDLCALC 46 03/13/2016   ALT 13 09/01/2016   AST 15 09/01/2016   NA 139 09/01/2016   K 3.9 09/01/2016   CL 106 09/01/2016   CREATININE 0.68 09/01/2016   BUN 14 09/01/2016   CO2 29 09/01/2016   HGBA1C 6.6 (H) 03/13/2016   MICROALBUR 1.9 09/13/2015    No results found.   Assessment & Plan:  Plan  I am having Cynthia Burns start on omeprazole. I am also having her maintain her aspirin EC, Cyanocobalamin (VITAMIN B-12 PO), (VITAMIN D, CHOLECALCIFEROL, PO), Biotin, meclizine, metFORMIN, mupirocin ointment, rosuvastatin, furosemide, losartan, and nebivolol.  Meds ordered this encounter  Medications  . omeprazole (PRILOSEC) 40 MG capsule    Sig: Take 1 capsule (40 mg total) by mouth daily.    Dispense:  30 capsule    Refill:  3    Problem List Items Addressed This Visit      Unprioritized   Abdominal pain, epigastric    ? Gallbladder Check labs If pain worsens go to er Cynthia Burns food        Other Visit Diagnoses    Pain of upper abdomen    -  Primary   Relevant Medications   omeprazole (PRILOSEC) 40 MG capsule   Other Relevant Orders   Comprehensive metabolic panel (Completed)   CBC with Differential/Platelet (Completed)   H. pylori antibody, IgG (Completed)   Amylase (Completed)   Lipase (Completed)   Encounter for immunization       Relevant Orders   Flu vaccine HIGH DOSE PF (Completed)   Comprehensive metabolic panel (Completed)   CBC with Differential/Platelet (Completed)   H. pylori antibody, IgG (Completed)   Amylase (Completed)   Lipase (Completed)      Follow-up: No Follow-up on file.  Cynthia Held, DO

## 2016-09-01 NOTE — Telephone Encounter (Signed)
error:[id number] ° °

## 2016-09-02 LAB — COMPREHENSIVE METABOLIC PANEL
ALBUMIN: 4.4 g/dL (ref 3.5–5.2)
ALT: 13 U/L (ref 0–35)
AST: 15 U/L (ref 0–37)
Alkaline Phosphatase: 36 U/L — ABNORMAL LOW (ref 39–117)
BUN: 14 mg/dL (ref 6–23)
CALCIUM: 9.6 mg/dL (ref 8.4–10.5)
CHLORIDE: 106 meq/L (ref 96–112)
CO2: 29 mEq/L (ref 19–32)
CREATININE: 0.68 mg/dL (ref 0.40–1.20)
GFR: 91.26 mL/min (ref >60.00)
Glucose, Bld: 114 mg/dL — ABNORMAL HIGH (ref 70–99)
POTASSIUM: 3.9 meq/L (ref 3.5–5.1)
Sodium: 139 mEq/L (ref 135–145)
Total Bilirubin: 0.4 mg/dL (ref 0.2–1.2)
Total Protein: 6.9 g/dL (ref 6.0–8.3)

## 2016-09-02 LAB — CBC WITH DIFFERENTIAL/PLATELET
BASOS PCT: 0.3 % (ref 0.0–3.0)
Basophils Absolute: 0 10*3/uL (ref 0.0–0.1)
EOS ABS: 0.1 10*3/uL (ref 0.0–0.7)
Eosinophils Relative: 0.9 % (ref 0.0–5.0)
HEMATOCRIT: 38.2 % (ref 36.0–46.0)
HEMOGLOBIN: 13 g/dL (ref 12.0–15.0)
LYMPHS PCT: 16.6 % (ref 12.0–46.0)
Lymphs Abs: 2.1 10*3/uL (ref 0.7–4.0)
MCHC: 34.1 g/dL (ref 30.0–36.0)
MCV: 88.5 fl (ref 78.0–100.0)
MONOS PCT: 2.2 % — AB (ref 3.0–12.0)
Monocytes Absolute: 0.3 10*3/uL (ref 0.1–1.0)
Neutro Abs: 10.1 10*3/uL — ABNORMAL HIGH (ref 1.4–7.7)
Neutrophils Relative %: 80 % — ABNORMAL HIGH (ref 43.0–77.0)
Platelets: 269 10*3/uL (ref 150.0–400.0)
RBC: 4.31 Mil/uL (ref 3.87–5.11)
RDW: 13.3 % (ref 11.5–15.5)
WBC: 12.6 10*3/uL — AB (ref 4.0–10.5)

## 2016-09-02 LAB — H. PYLORI ANTIBODY, IGG: H Pylori IgG: NEGATIVE

## 2016-09-02 LAB — LIPASE: LIPASE: 129 U/L — AB (ref 11.0–59.0)

## 2016-09-02 LAB — AMYLASE: AMYLASE: 45 U/L (ref 27–131)

## 2016-09-03 ENCOUNTER — Other Ambulatory Visit: Payer: Self-pay

## 2016-09-03 ENCOUNTER — Ambulatory Visit (HOSPITAL_BASED_OUTPATIENT_CLINIC_OR_DEPARTMENT_OTHER)
Admission: RE | Admit: 2016-09-03 | Discharge: 2016-09-03 | Disposition: A | Discharge status: Home / Self Care | Payer: Medicare Other | Source: Ambulatory Visit | Attending: Family Medicine | Admitting: Family Medicine

## 2016-09-03 DIAGNOSIS — R1013 Epigastric pain: Secondary | ICD-10-CM | POA: Insufficient documentation

## 2016-09-03 DIAGNOSIS — K8689 Other specified diseases of pancreas: Secondary | ICD-10-CM | POA: Diagnosis not present

## 2016-09-03 DIAGNOSIS — K429 Umbilical hernia without obstruction or gangrene: Secondary | ICD-10-CM | POA: Insufficient documentation

## 2016-09-03 DIAGNOSIS — K7689 Other specified diseases of liver: Secondary | ICD-10-CM | POA: Diagnosis not present

## 2016-09-03 DIAGNOSIS — K8020 Calculus of gallbladder without cholecystitis without obstruction: Secondary | ICD-10-CM | POA: Diagnosis not present

## 2016-09-03 MED ORDER — IOPAMIDOL (ISOVUE-300) INJECTION 61%
100.0000 mL | Freq: Once | INTRAVENOUS | Status: AC | PRN
  Administered 2016-09-03: 100 mL via INTRAVENOUS

## 2016-09-03 NOTE — Assessment & Plan Note (Signed)
?   Gallbladder Check labs If pain worsens go to er [facility]

## 2016-09-03 NOTE — Progress Notes (Signed)
Call from imaging, need CT changed to CT abdomen WITH contrast. Discussed w/ Dr. Lorelei Pont and ok to change order.

## 2016-09-04 ENCOUNTER — Other Ambulatory Visit: Payer: Self-pay | Admitting: Family Medicine

## 2016-09-04 DIAGNOSIS — K810 Acute cholecystitis: Secondary | ICD-10-CM

## 2016-09-12 ENCOUNTER — Ambulatory Visit: Payer: Self-pay | Admitting: General Surgery

## 2016-09-12 DIAGNOSIS — K8020 Calculus of gallbladder without cholecystitis without obstruction: Secondary | ICD-10-CM | POA: Diagnosis not present

## 2016-09-12 NOTE — H&P (Signed)
History of Present Illness Ralene Ok MD; 09/12/2016 8:54 AM) The patient is a 70 year old female who presents for evaluation of gall stones. The patient is a 70 year old female who is referred by Dr. Garnet Koyanagi for evaluation stigmatic stones. Patient states that several weeks ago she began with epigastric abdominal pain. She states this was after eating red meat, fatty foods, spicy foods. She states that the pain lasted overnight. He states the following day she was sore. Symptoms consistent with reflux. Patient had no nausea or vomiting.  Patient was followed by her PCP and underwent laboratory studies as well as CT scan. Laboratory studies were within normal limits. CT scan revealed cholelithiasis, mild diffuse gallbladder wall thickening consistent with acute or chronic cholecystitis  Patient had a previous history of a hysterectomy.   Other Problems (Ammie Eversole, LPN; [id number] 075-GRM AM) Cholelithiasis Diabetes Mellitus Hypercholesterolemia  Diagnostic Studies History (Ammie Eversole, LPN; X33443 075-GRM AM) Mammogram within last year  Allergies (Ammie Eversole, LPN; X33443 QA348G AM) AmLODIPine Bes+SyrSpend SF *CALCIUM CHANNEL BLOCKERS* Codeine Phosphate *ANALGESICS - OPIOID* GuaiFENesin *COUGH/COLD/ALLERGY* Lisinopril *ANTIHYPERTENSIVES* Niacin (Antihyperlipidemic) *ANTIHYPERLIPIDEMICS* Ramipril *ANTIHYPERTENSIVES* Sulfonamide Derivatives  Medication History (Ammie Eversole, LPN; X33443 075-GRM AM) MetFORMIN HCl (1000MG  Tablet, Oral) Active. Lasix (20MG  Tablet, Oral) Active. Cozaar (25MG  Tablet, Oral) Active. Bystolic (5MG  Tablet, Oral) Active. Aspirin (81MG  Tablet Chewable, Oral) Active. Medications Reconciled  Social History Aleatha Borer, LPN; X33443 075-GRM AM) No drug use  Family History (Ammie Eversole, LPN; X33443 075-GRM AM) Diabetes Mellitus Father.  Pregnancy / Birth History Aleatha Borer, LPN; X33443 075-GRM AM) Age  at menarche 13 years. Gravida 1 Para 1    Review of Systems Ralene Ok MD; 09/12/2016 8:53 AM) General Not Present- Appetite Loss, Chills, Fatigue, Fever, Night Sweats, Weight Gain and Weight Loss. Skin Not Present- Change in Wart/Mole, Dryness, Hives, Jaundice, New Lesions, Non-Healing Wounds, Rash and Ulcer. HEENT Not Present- Earache, Hearing Loss, Hoarseness, Nose Bleed, Oral Ulcers, Ringing in the Ears, Seasonal Allergies, Sinus Pain, Sore Throat, Visual Disturbances, Wears glasses/contact lenses and Yellow Eyes. Respiratory Not Present- Bloody sputum, Chronic Cough, Difficulty Breathing, Snoring and Wheezing. Breast Not Present- Breast Mass, Breast Pain, Nipple Discharge and Skin Changes. Cardiovascular Not Present- Chest Pain, Difficulty Breathing Lying Down, Leg Cramps, Palpitations, Rapid Heart Rate, Shortness of Breath and Swelling of Extremities. Gastrointestinal Present- Abdominal Pain. Not Present- Bloating, Bloody Stool, Change in Bowel Habits, Chronic diarrhea, Constipation, Difficulty Swallowing, Excessive gas, Gets full quickly at meals, Hemorrhoids, Indigestion, Nausea, Rectal Pain and Vomiting. Female Genitourinary Not Present- Frequency, Nocturia, Painful Urination, Pelvic Pain and Urgency. Musculoskeletal Present- Back Pain. Not Present- Joint Pain, Joint Stiffness, Muscle Pain, Muscle Weakness and Swelling of Extremities. Neurological Not Present- Weakness. Endocrine Not Present- Cold Intolerance, Excessive Hunger, Hair Changes, Heat Intolerance, Hot flashes and New Diabetes. Hematology Not Present- Blood Thinners, Easy Bruising, Excessive bleeding, Gland problems, HIV and Persistent Infections.  Vitals (Ammie Eversole LPN; X33443 X33443 AM) 09/12/2016 8:26 AM Weight: 139.6 lb Height: 62in Body Surface Area: 1.64 m Body Mass Index: 25.53 kg/m        Physical Exam Ralene Ok, MD; 09/12/2016 8:53 AM) General Mental Status-Alert. General  Appearance-Consistent with stated age. Hydration-Well hydrated. Voice-Normal.  Head and Neck Head-normocephalic, atraumatic with no lesions or palpable masses.  Eye Eyeball - Bilateral-Extraocular movements intact. Sclera/Conjunctiva - Bilateral-No scleral icterus.  Chest and Lung Exam Chest and lung exam reveals -quiet, even and easy respiratory effort with no use of accessory muscles. Inspection Chest Wall - Normal. Back - normal.  Cardiovascular Cardiovascular examination reveals -normal heart sounds, regular rate and rhythm with no murmurs.  Abdomen Inspection Normal Exam - No Hernias. Palpation/Percussion Normal exam - Soft, Non Tender, No Rebound tenderness, No Rigidity (guarding) and No hepatosplenomegaly. Auscultation Normal exam - Bowel sounds normal.  Neurologic Neurologic evaluation reveals -alert and oriented x 3 with no impairment of recent or remote memory. Mental Status-Normal.  Musculoskeletal Normal Exam - Left-Upper Extremity Strength Normal and Lower Extremity Strength Normal. Normal Exam - Right-Upper Extremity Strength Normal, Lower Extremity Weakness.    Assessment & Plan Ralene Ok MD; 09/12/2016 8:55 AM) SYMPTOMATIC CHOLELITHIASIS (K80.20) Impression: 70 year old female with symptomatic cholelithiasis  1. We will proceed to the operating room for a laparoscopic cholecystectomy 2. Risks and benefits were discussed with the patient to generally include, but not limited to: infection, bleeding, possible need for post op ERCP, damage to the bile ducts, bile leak, and possible need for further surgery. Alternatives were offered and described. All questions were answered and the patient voiced understanding of the procedure and wishes to proceed at this point with a laparoscopic cholecystectomy

## 2016-09-16 ENCOUNTER — Encounter: Payer: 59 | Admitting: Family Medicine

## 2016-09-19 ENCOUNTER — Other Ambulatory Visit: Payer: Self-pay | Admitting: Family Medicine

## 2016-09-19 DIAGNOSIS — Z1231 Encounter for screening mammogram for malignant neoplasm of breast: Secondary | ICD-10-CM

## 2016-09-25 ENCOUNTER — Ambulatory Visit (INDEPENDENT_AMBULATORY_CARE_PROVIDER_SITE_OTHER): Payer: Medicare Other | Admitting: Family Medicine

## 2016-09-25 ENCOUNTER — Encounter: Payer: Self-pay | Admitting: Family Medicine

## 2016-09-25 ENCOUNTER — Other Ambulatory Visit: Payer: Self-pay | Admitting: Family Medicine

## 2016-09-25 VITALS — BP 116/72 | HR 61 | Temp 98.0°F | Resp 16 | Ht 62.0 in | Wt 140.4 lb

## 2016-09-25 DIAGNOSIS — Z0000 Encounter for general adult medical examination without abnormal findings: Secondary | ICD-10-CM

## 2016-09-25 DIAGNOSIS — IMO0002 Reserved for concepts with insufficient information to code with codable children: Secondary | ICD-10-CM

## 2016-09-25 DIAGNOSIS — E785 Hyperlipidemia, unspecified: Secondary | ICD-10-CM

## 2016-09-25 DIAGNOSIS — I10 Essential (primary) hypertension: Secondary | ICD-10-CM | POA: Diagnosis not present

## 2016-09-25 DIAGNOSIS — E1165 Type 2 diabetes mellitus with hyperglycemia: Secondary | ICD-10-CM

## 2016-09-25 DIAGNOSIS — E1151 Type 2 diabetes mellitus with diabetic peripheral angiopathy without gangrene: Secondary | ICD-10-CM

## 2016-09-25 DIAGNOSIS — E2839 Other primary ovarian failure: Secondary | ICD-10-CM

## 2016-09-25 DIAGNOSIS — R319 Hematuria, unspecified: Secondary | ICD-10-CM

## 2016-09-25 LAB — COMPREHENSIVE METABOLIC PANEL
ALT: 13 U/L (ref 0–35)
AST: 17 U/L (ref 0–37)
Albumin: 4.1 g/dL (ref 3.5–5.2)
Alkaline Phosphatase: 37 U/L — ABNORMAL LOW (ref 39–117)
BILIRUBIN TOTAL: 0.4 mg/dL (ref 0.2–1.2)
BUN: 15 mg/dL (ref 6–23)
CO2: 29 meq/L (ref 19–32)
Calcium: 9 mg/dL (ref 8.4–10.5)
Chloride: 106 mEq/L (ref 96–112)
Creatinine, Ser: 0.78 mg/dL (ref 0.40–1.20)
GFR: 77.88 mL/min (ref >60.00)
GLUCOSE: 115 mg/dL — AB (ref 70–99)
POTASSIUM: 4 meq/L (ref 3.5–5.1)
SODIUM: 141 meq/L (ref 135–145)
Total Protein: 6.8 g/dL (ref 6.0–8.3)

## 2016-09-25 LAB — LIPID PANEL
CHOL/HDL RATIO: 3
Cholesterol: 103 mg/dL (ref 0–200)
HDL: 41 mg/dL (ref >39.00)
LDL Cholesterol: 43 mg/dL (ref 0–99)
NONHDL: 61.87
Triglycerides: 96 mg/dL (ref 0.0–149.0)
VLDL: 19.2 mg/dL (ref 0.0–40.0)

## 2016-09-25 LAB — CBC WITH DIFFERENTIAL/PLATELET
BASOS ABS: 0 10*3/uL (ref 0.0–0.1)
Basophils Relative: 0.6 % (ref 0.0–3.0)
Eosinophils Absolute: 0.1 10*3/uL (ref 0.0–0.7)
Eosinophils Relative: 1.6 % (ref 0.0–5.0)
HEMATOCRIT: 38.8 % (ref 36.0–46.0)
Hemoglobin: 13 g/dL (ref 12.0–15.0)
LYMPHS ABS: 1.9 10*3/uL (ref 0.7–4.0)
Lymphocytes Relative: 31.2 % (ref 12.0–46.0)
MCHC: 33.5 g/dL (ref 30.0–36.0)
MCV: 88.1 fl (ref 78.0–100.0)
MONO ABS: 0.4 10*3/uL (ref 0.1–1.0)
Monocytes Relative: 6.3 % (ref 3.0–12.0)
NEUTROS PCT: 60.3 % (ref 43.0–77.0)
Neutro Abs: 3.6 10*3/uL (ref 1.4–7.7)
PLATELETS: 257 10*3/uL (ref 150.0–400.0)
RBC: 4.4 Mil/uL (ref 3.87–5.11)
RDW: 13.3 % (ref 11.5–15.5)
WBC: 6 10*3/uL (ref 4.0–10.5)

## 2016-09-25 LAB — POCT URINALYSIS DIPSTICK
Glucose, UA: NEGATIVE
KETONES UA: NEGATIVE
Leukocytes, UA: NEGATIVE
Nitrite, UA: NEGATIVE
PH UA: 5.5
UROBILINOGEN UA: 0.2

## 2016-09-25 LAB — HEMOGLOBIN A1C: HEMOGLOBIN A1C: 6.8 % — AB (ref 4.6–6.5)

## 2016-09-25 MED ORDER — LOSARTAN POTASSIUM 25 MG PO TABS: 25.0000 mg | ORAL_TABLET | Freq: Every day | ORAL | 3 refills | Status: DC

## 2016-09-25 MED ORDER — ROSUVASTATIN CALCIUM 20 MG PO TABS: 20.0000 mg | ORAL_TABLET | Freq: Every day | ORAL | Status: DC

## 2016-09-25 MED ORDER — NEBIVOLOL HCL 5 MG PO TABS: 5.0000 mg | ORAL_TABLET | Freq: Every day | ORAL | 3 refills | Status: DC

## 2016-09-25 MED ORDER — METFORMIN HCL ER 500 MG PO TB24: 1000.0000 mg | ORAL_TABLET | Freq: Two times a day (BID) | ORAL | 1 refills | Status: DC

## 2016-09-25 MED ORDER — GLUCOSE BLOOD VI STRP: ORAL_STRIP | 12 refills | Status: DC

## 2016-09-25 NOTE — Progress Notes (Signed)
Subjective:   Cynthia Burns is a 70 y.o. female who presents for Medicare Annual (Subsequent) preventive examination.  She is going to have her GB out nov 5.    Review of Systems:   Review of Systems  Constitutional: Negative for activity change, appetite change and fatigue.  HENT: Negative for hearing loss, congestion, tinnitus and ear discharge.   Eyes: Negative for visual disturbance (see optho q1y -- vision corrected to 20/20 with glasses).  Respiratory: Negative for cough, chest tightness and shortness of breath.   Cardiovascular: Negative for chest pain, palpitations and leg swelling.  Gastrointestinal: Negative for abdominal pain, diarrhea, constipation and abdominal distention.  Genitourinary: Negative for urgency, frequency, decreased urine volume and difficulty urinating.  Musculoskeletal: Negative for back pain, arthralgias and gait problem.  Skin: Negative for color change, pallor and rash.  Neurological: Negative for dizziness, light-headedness, numbness and headaches.  Hematological: Negative for adenopathy. Does not bruise/bleed easily.  Psychiatric/Behavioral: Negative for suicidal ideas, confusion, sleep disturbance, self-injury, dysphoric mood, decreased concentration and agitation.  Pt is able to read and write and can do all ADLs No risk for falling No abuse/ violence in home         Objective:     Vitals: BP 116/72 (BP Location: Left Arm, Patient Position: Sitting, Cuff Size: Normal)   Pulse 61   Temp 98 F (36.7 C) (Oral)   Resp 16   Ht 5\' 2"  (1.575 m)   Wt 140 lb 6.4 oz (63.7 kg)   SpO2 99%   BMI 25.68 kg/m   Body mass index is 25.68 kg/m. BP 116/72 (BP Location: Left Arm, Patient Position: Sitting, Cuff Size: Normal)   Pulse 61   Temp 98 F (36.7 C) (Oral)   Resp 16   Ht 5\' 2"  (1.575 m)   Wt 140 lb 6.4 oz (63.7 kg)   SpO2 99%   BMI 25.68 kg/m  General appearance: alert, cooperative, appears stated age and no distress Head:  Normocephalic, without obvious abnormality, atraumatic Eyes: conjunctivae/corneas clear. PERRL, EOM's intact. Fundi benign. Ears: normal TM's and external ear canals both ears Nose: Nares normal. Septum midline. Mucosa normal. No drainage or sinus tenderness. Throat: lips, mucosa, and tongue normal; teeth and gums normal Neck: no adenopathy, no carotid bruit, no JVD, supple, symmetrical, trachea midline and thyroid not enlarged, symmetric, no tenderness/mass/nodules Back: symmetric, no curvature. ROM normal. No CVA tenderness. Lungs: clear to auscultation bilaterally Breasts: normal appearance, no masses or tenderness Heart: regular rate and rhythm, S1, S2 normal, no murmur, click, rub or gallop Abdomen: soft, non-tender; bowel sounds normal; no masses,  no organomegaly Pelvic: not indicated; status post hysterectomy, negative ROS Extremities: extremities normal, atraumatic, no cyanosis or edema Pulses: 2+ and symmetric Skin: Skin color, texture, turgor normal. No rashes or lesions Lymph nodes: Cervical, supraclavicular, and axillary nodes normal. Neurologic: Alert and oriented X 3, normal strength and tone. Normal symmetric reflexes. Normal coordination and gait  Tobacco History  Smoking Status  . Never Smoker  Smokeless Tobacco  . Never Used     Counseling given: Not Answered   Past Medical History:  Diagnosis Date  . Diabetes mellitus without complication ([place])   . 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 HYPERTENSION   . MITRAL VALVE PROLAPSE   . OSTEOPENIA   . Overweight(278.02)   . PHARYNGITIS, ACUTE    Past Surgical History:  Procedure Laterality Date  . ABDOMINAL HYSTERECTOMY     Family  History  Problem Relation Age of Onset  . Emphysema Father   . Diabetes Father   . Heart disease Mother     Had a stent  . Hypertension    . Colon cancer Neg Hx    History  Sexual Activity  . Sexual activity: Not on file    Outpatient Encounter Prescriptions as of  09/25/2016  Medication Sig  . aspirin EC 81 MG tablet Take 1 tablet (81 mg total) by mouth daily.  . Biotin (PA BIOTIN) 1000 MCG tablet Take 1,000 mcg by mouth daily.  . Cyanocobalamin (VITAMIN B-12 PO) Take 1 tablet by mouth daily.   . furosemide (LASIX) 20 MG tablet Take 0.5 tablets (10 mg total) by mouth daily.  Marland Kitchen losartan (COZAAR) 25 MG tablet Take 1 tablet (25 mg total) by mouth daily.  . meclizine (ANTIVERT) 25 MG tablet Take 1 tablet (25 mg total) by mouth as needed for dizziness.  . metFORMIN (GLUCOPHAGE-XR) 500 MG 24 hr tablet Take 2 tablets (1,000 mg total) by mouth 2 (two) times daily.  . nebivolol (BYSTOLIC) 5 MG tablet Take 1 tablet (5 mg total) by mouth daily.  Marland Kitchen omeprazole (PRILOSEC) 40 MG capsule Take 1 capsule (40 mg total) by mouth daily.  . rosuvastatin (CRESTOR) 20 MG tablet Take 1 tablet (20 mg total) by mouth daily.  Marland Kitchen VITAMIN D, CHOLECALCIFEROL, PO Take 1 tablet by mouth daily.   . [DISCONTINUED] losartan (COZAAR) 25 MG tablet Take 1 tablet (25 mg total) by mouth daily.  . [DISCONTINUED] metFORMIN (GLUCOPHAGE-XR) 500 MG 24 hr tablet Take 2 tablets (1,000 mg total) by mouth 2 (two) times daily.  . [DISCONTINUED] nebivolol (BYSTOLIC) 5 MG tablet Take 1 tablet (5 mg total) by mouth daily.  . [DISCONTINUED] rosuvastatin (CRESTOR) 20 MG tablet Take 20 mg by mouth daily.  . mupirocin ointment (BACTROBAN) 2 % Place 1 application into the nose 2 (two) times daily. (Patient not taking: Reported on 09/25/2016)  . [DISCONTINUED] glucose blood test strip Use as instructed   No facility-administered encounter medications on file as of 09/25/2016.     Activities of Daily Living In your present state of health, do you have any difficulty performing the following activities: 09/25/2016  Hearing? N  Vision? N  Difficulty concentrating or making decisions? N  Walking or climbing stairs? N  Dressing or bathing? N  Doing errands, shopping? N  Some recent data might be hidden     Patient Care Team: Ann Held, DO as PCP - General Josue Hector, MD as Consulting Physician (Cardiology) Shon Hough, MD as Consulting Physician (Ophthalmology) Lafayette Dragon, MD (Inactive) (Gastroenterology) Mcarthur Rossetti, MD as Consulting Physician (Orthopedic Surgery)    Assessment:    Cpe Exercise Activities and Dietary recommendations Current Exercise Habits: The patient does not participate in regular exercise at present, Exercise limited by: None identified  Goals    None     Fall Risk Fall Risk  09/25/2016 09/13/2015 03/15/2015 09/05/2013  Falls in the past year? No No No Yes  Number falls in past yr: - - - 1  Injury with Fall? - - - Yes  Risk for fall due to : - - - Other (Comment)  Risk for fall due to (comments): - - - Fell in a hole while gardening   Depression Screen PHQ 2/9 Scores 09/25/2016 09/13/2015 03/15/2015 09/05/2013  PHQ - 2 Score 0 0 0 1     Cognitive Testing MMSE - Mini Mental State  Exam 09/25/2016  Orientation to time 5  Orientation to Place 5  Registration 3  Attention/ Calculation 5  Recall 3  Language- name 2 objects 2  Language- repeat 1  Language- follow 3 step command 3  Language- read & follow direction 1  Write a sentence 1  Copy design 1  Total score 30    Immunization History  Administered Date(s) Administered  . Influenza Whole 10/20/2007  . Influenza, High Dose Seasonal PF 09/01/2016  . Influenza,inj,Quad PF,36+ Mos 09/05/2013, 09/05/2014, 09/13/2015  . Pneumococcal Conjugate-13 02/27/2014  . Pneumococcal Polysaccharide-23 09/06/2011  . Zoster 09/14/2013   Screening Tests Health Maintenance  Topic Date Due  . PNA vac Low Risk Adult (2 of 2 - PPSV23) 09/05/2016  . HEMOGLOBIN A1C  03/26/2017  . OPHTHALMOLOGY EXAM  05/12/2017  . FOOT EXAM  09/25/2017  . MAMMOGRAM  10/07/2017  . COLONOSCOPY  11/09/2023  . TETANUS/TDAP  09/13/2024  . INFLUENZA VACCINE  Completed  . DEXA SCAN  Completed  .  ZOSTAVAX  Completed  . Hepatitis C Screening  Completed      Plan:   see AVS During the course of the visit the patient was educated and counseled about the following appropriate screening and preventive services:   Vaccines to include Pneumoccal, Influenza, Hepatitis B, Td, Zostavax, HCV  Electrocardiogram  Cardiovascular Disease  Colorectal cancer screening  Bone density screening  Diabetes screening  Glaucoma screening  Mammography/PAP  Nutrition counseling  1. DM (diabetes mellitus) type II uncontrolled, periph vascular disorder (HCC) Check labs con't accu checks  - Hemoglobin A1c - POCT urinalysis dipstick - glucose blood test strip; Use as instructed  Dispense: 100 each; Refill: 12 - rosuvastatin (CRESTOR) 20 MG tablet; Take 1 tablet (20 mg total) by mouth daily. - metFORMIN (GLUCOPHAGE-XR) 500 MG 24 hr tablet; Take 2 tablets (1,000 mg total) by mouth 2 (two) times daily.  Dispense: 360 tablet; Refill: 1  2. Hyperlipidemia LDL goal <70 Check labs con't crestor  - Comprehensive metabolic panel - Lipid panel - POCT urinalysis dipstick  3. Essential hypertension Stable con't bystolic and losartan - Comprehensive metabolic panel - CBC with Differential/Platelet - POCT urinalysis dipstick - nebivolol (BYSTOLIC) 5 MG tablet; Take 1 tablet (5 mg total) by mouth daily.  Dispense: 90 tablet; Refill: 3 - losartan (COZAAR) 25 MG tablet; Take 1 tablet (25 mg total) by mouth daily.  Dispense: 90 tablet; Refill: 3  4. Medicare annual wellness visit, subsequent See above  Patient Instructions (the written plan) was given to the patient.  [place], DO  09/26/2016

## 2016-09-25 NOTE — Patient Instructions (Signed)
Preventive Care for Adults, Female A healthy lifestyle and preventive care can promote health and wellness. Preventive health guidelines for women include the following key practices.  A routine yearly physical is a good way to check with your health care provider about your health and preventive screening. It is a chance to share any concerns and updates on your health and to receive a thorough exam.  Visit your dentist for a routine exam and preventive care every 6 months. Brush your teeth twice a day and floss once a day. Good oral hygiene prevents tooth decay and gum disease.  The frequency of eye exams is based on your age, health, family medical history, use of contact lenses, and other factors. Follow your health care provider's recommendations for frequency of eye exams.  Eat a healthy diet. Foods like vegetables, fruits, whole grains, low-fat dairy products, and lean protein foods contain the nutrients you need without too many calories. Decrease your intake of foods high in solid fats, added sugars, and salt. Eat the right amount of calories for you.Get information about a proper diet from your health care provider, if necessary.  Regular physical exercise is one of the most important things you can do for your health. Most adults should get at least 150 minutes of moderate-intensity exercise (any activity that increases your heart rate and causes you to sweat) each week. In addition, most adults need muscle-strengthening exercises on 2 or more days a week.  Maintain a healthy weight. The body mass index (BMI) is a screening tool to identify possible weight problems. It provides an estimate of body fat based on height and weight. Your health care provider can find your BMI and can help you achieve or maintain a healthy weight.For adults 20 years and older:  A BMI below 18.5 is considered underweight.  A BMI of 18.5 to 24.9 is normal.  A BMI of 25 to 29.9 is considered overweight.  A  BMI of 30 and above is considered obese.  Maintain normal blood lipids and cholesterol levels by exercising and minimizing your intake of saturated fat. Eat a balanced diet with plenty of fruit and vegetables. Blood tests for lipids and cholesterol should begin at age 45 and be repeated every 5 years. If your lipid or cholesterol levels are high, you are over 50, or you are at high risk for heart disease, you may need your cholesterol levels checked more frequently.Ongoing high lipid and cholesterol levels should be treated with medicines if diet and exercise are not working.  If you smoke, find out from your health care provider how to quit. If you do not use tobacco, do not start.  Lung cancer screening is recommended for adults aged 45-80 years who are at high risk for developing lung cancer because of a history of smoking. A yearly low-dose CT scan of the lungs is recommended for people who have at least a 30-pack-year history of smoking and are a current smoker or have quit within the past 15 years. A pack year of smoking is smoking an average of 1 pack of cigarettes a day for 1 year (for example: 1 pack a day for 30 years or 2 packs a day for 15 years). Yearly screening should continue until the smoker has stopped smoking for at least 15 years. Yearly screening should be stopped for people who develop a health problem that would prevent them from having lung cancer treatment.  If you are pregnant, do not drink alcohol. If you are  breastfeeding, be very cautious about drinking alcohol. If you are not pregnant and choose to drink alcohol, do not have more than 1 drink per day. One drink is considered to be 12 ounces (355 mL) of beer, 5 ounces (148 mL) of wine, or 1.5 ounces (44 mL) of liquor.  Avoid use of street drugs. Do not share needles with anyone. Ask for help if you need support or instructions about stopping the use of drugs.  High blood pressure causes heart disease and increases the risk  of stroke. Your blood pressure should be checked at least every 1 to 2 years. Ongoing high blood pressure should be treated with medicines if weight loss and exercise do not work.  If you are 55-79 years old, ask your health care provider if you should take aspirin to prevent strokes.  Diabetes screening is done by taking a blood sample to check your blood glucose level after you have not eaten for a certain period of time (fasting). If you are not overweight and you do not have risk factors for diabetes, you should be screened once every 3 years starting at age 45. If you are overweight or obese and you are 40-70 years of age, you should be screened for diabetes every year as part of your cardiovascular risk assessment.  Breast cancer screening is essential preventive care for women. You should practice "breast self-awareness." This means understanding the normal appearance and feel of your breasts and may include breast self-examination. Any changes detected, no matter how small, should be reported to a health care provider. Women in their 20s and 30s should have a clinical breast exam (CBE) by a health care provider as part of a regular health exam every 1 to 3 years. After age 40, women should have a CBE every year. Starting at age 40, women should consider having a mammogram (breast X-ray test) every year. Women who have a family history of breast cancer should talk to their health care provider about genetic screening. Women at a high risk of breast cancer should talk to their health care providers about having an MRI and a mammogram every year.  Breast cancer gene (BRCA)-related cancer risk assessment is recommended for women who have family members with BRCA-related cancers. BRCA-related cancers include breast, ovarian, tubal, and peritoneal cancers. Having family members with these cancers may be associated with an increased risk for harmful changes (mutations) in the breast cancer genes BRCA1 and  BRCA2. Results of the assessment will determine the need for genetic counseling and BRCA1 and BRCA2 testing.  Your health care provider may recommend that you be screened regularly for cancer of the pelvic organs (ovaries, uterus, and vagina). This screening involves a pelvic examination, including checking for microscopic changes to the surface of your cervix (Pap test). You may be encouraged to have this screening done every 3 years, beginning at age 21.  For women ages 30-65, health care providers may recommend pelvic exams and Pap testing every 3 years, or they may recommend the Pap and pelvic exam, combined with testing for human papilloma virus (HPV), every 5 years. Some types of HPV increase your risk of cervical cancer. Testing for HPV may also be done on women of any age with unclear Pap test results.  Other health care providers may not recommend any screening for nonpregnant women who are considered low risk for pelvic cancer and who do not have symptoms. Ask your health care provider if a screening pelvic exam is right for   you.  If you have had past treatment for cervical cancer or a condition that could lead to cancer, you need Pap tests and screening for cancer for at least 20 years after your treatment. If Pap tests have been discontinued, your risk factors (such as having a new sexual partner) need to be reassessed to determine if screening should resume. Some women have medical problems that increase the chance of getting cervical cancer. In these cases, your health care provider may recommend more frequent screening and Pap tests.  Colorectal cancer can be detected and often prevented. Most routine colorectal cancer screening begins at the age of 50 years and continues through age 75 years. However, your health care provider may recommend screening at an earlier age if you have risk factors for colon cancer. On a yearly basis, your health care provider may provide home test kits to check  for hidden blood in the stool. Use of a small camera at the end of a tube, to directly examine the colon (sigmoidoscopy or colonoscopy), can detect the earliest forms of colorectal cancer. Talk to your health care provider about this at age 50, when routine screening begins. Direct exam of the colon should be repeated every 5-10 years through age 75 years, unless early forms of precancerous polyps or small growths are found.  People who are at an increased risk for hepatitis B should be screened for this virus. You are considered at high risk for hepatitis B if:  You were born in a country where hepatitis B occurs often. Talk with your health care provider about which countries are considered high risk.  Your parents were born in a high-risk country and you have not received a shot to protect against hepatitis B (hepatitis B vaccine).  You have HIV or AIDS.  You use needles to inject street drugs.  You live with, or have sex with, someone who has hepatitis B.  You get hemodialysis treatment.  You take certain medicines for conditions like cancer, organ transplantation, and autoimmune conditions.  Hepatitis C blood testing is recommended for all people born from 1945 through 1965 and any individual with known risks for hepatitis C.  Practice safe sex. Use condoms and avoid high-risk sexual practices to reduce the spread of sexually transmitted infections (STIs). STIs include gonorrhea, chlamydia, syphilis, trichomonas, herpes, HPV, and human immunodeficiency virus (HIV). Herpes, HIV, and HPV are viral illnesses that have no cure. They can result in disability, cancer, and death.  You should be screened for sexually transmitted illnesses (STIs) including gonorrhea and chlamydia if:  You are sexually active and are younger than 24 years.  You are older than 24 years and your health care provider tells you that you are at risk for this type of infection.  Your sexual activity has changed  since you were last screened and you are at an increased risk for chlamydia or gonorrhea. Ask your health care provider if you are at risk.  If you are at risk of being infected with HIV, it is recommended that you take a prescription medicine daily to prevent HIV infection. This is called preexposure prophylaxis (PrEP). You are considered at risk if:  You are sexually active and do not regularly use condoms or know the HIV status of your partner(s).  You take drugs by injection.  You are sexually active with a partner who has HIV.  Talk with your health care provider about whether you are at high risk of being infected with HIV. If   you choose to begin PrEP, you should first be tested for HIV. You should then be tested every 3 months for as long as you are taking PrEP.  Osteoporosis is a disease in which the bones lose minerals and strength with aging. This can result in serious bone fractures or breaks. The risk of osteoporosis can be identified using a bone density scan. Women ages 67 years and over and women at risk for fractures or osteoporosis should discuss screening with their health care providers. Ask your health care provider whether you should take a calcium supplement or vitamin D to reduce the rate of osteoporosis.  Menopause can be associated with physical symptoms and risks. Hormone replacement therapy is available to decrease symptoms and risks. You should talk to your health care provider about whether hormone replacement therapy is right for you.  Use sunscreen. Apply sunscreen liberally and repeatedly throughout the day. You should seek shade when your shadow is shorter than you. Protect yourself by wearing long sleeves, pants, a wide-brimmed hat, and sunglasses year round, whenever you are outdoors.  Once a month, do a whole body skin exam, using a mirror to look at the skin on your back. Tell your health care provider of new moles, moles that have irregular borders, moles that  are larger than a pencil eraser, or moles that have changed in shape or color.  Stay current with required vaccines (immunizations).  Influenza vaccine. All adults should be immunized every year.  Tetanus, diphtheria, and acellular pertussis (Td, Tdap) vaccine. Pregnant women should receive 1 dose of Tdap vaccine during each pregnancy. The dose should be obtained regardless of the length of time since the last dose. Immunization is preferred during the 27th-36th week of gestation. An adult who has not previously received Tdap or who does not know her vaccine status should receive 1 dose of Tdap. This initial dose should be followed by tetanus and diphtheria toxoids (Td) booster doses every 10 years. Adults with an unknown or incomplete history of completing a 3-dose immunization series with Td-containing vaccines should begin or complete a primary immunization series including a Tdap dose. Adults should receive a Td booster every 10 years.  Varicella vaccine. An adult without evidence of immunity to varicella should receive 2 doses or a second dose if she has previously received 1 dose. Pregnant females who do not have evidence of immunity should receive the first dose after pregnancy. This first dose should be obtained before leaving the health care facility. The second dose should be obtained 4-8 weeks after the first dose.  Human papillomavirus (HPV) vaccine. Females aged 13-26 years who have not received the vaccine previously should obtain the 3-dose series. The vaccine is not recommended for use in pregnant females. However, pregnancy testing is not needed before receiving a dose. If a female is found to be pregnant after receiving a dose, no treatment is needed. In that case, the remaining doses should be delayed until after the pregnancy. Immunization is recommended for any person with an immunocompromised condition through the age of 61 years if she did not get any or all doses earlier. During the  3-dose series, the second dose should be obtained 4-8 weeks after the first dose. The third dose should be obtained 24 weeks after the first dose and 16 weeks after the second dose.  Zoster vaccine. One dose is recommended for adults aged 30 years or older unless certain conditions are present.  Measles, mumps, and rubella (MMR) vaccine. Adults born  before 1957 generally are considered immune to measles and mumps. Adults born in 1957 or later should have 1 or more doses of MMR vaccine unless there is a contraindication to the vaccine or there is laboratory evidence of immunity to each of the three diseases. A routine second dose of MMR vaccine should be obtained at least 28 days after the first dose for students attending postsecondary schools, health care workers, or international travelers. People who received inactivated measles vaccine or an unknown type of measles vaccine during 1963-1967 should receive 2 doses of MMR vaccine. People who received inactivated mumps vaccine or an unknown type of mumps vaccine before 1979 and are at high risk for mumps infection should consider immunization with 2 doses of MMR vaccine. For females of childbearing age, rubella immunity should be determined. If there is no evidence of immunity, females who are not pregnant should be vaccinated. If there is no evidence of immunity, females who are pregnant should delay immunization until after pregnancy. Unvaccinated health care workers born before 1957 who lack laboratory evidence of measles, mumps, or rubella immunity or laboratory confirmation of disease should consider measles and mumps immunization with 2 doses of MMR vaccine or rubella immunization with 1 dose of MMR vaccine.  Pneumococcal 13-valent conjugate (PCV13) vaccine. When indicated, a person who is uncertain of his immunization history and has no record of immunization should receive the PCV13 vaccine. All adults 65 years of age and older should receive this  vaccine. An adult aged 19 years or older who has certain medical conditions and has not been previously immunized should receive 1 dose of PCV13 vaccine. This PCV13 should be followed with a dose of pneumococcal polysaccharide (PPSV23) vaccine. Adults who are at high risk for pneumococcal disease should obtain the PPSV23 vaccine at least 8 weeks after the dose of PCV13 vaccine. Adults older than 69 years of age who have normal immune system function should obtain the PPSV23 vaccine dose at least 1 year after the dose of PCV13 vaccine.  Pneumococcal polysaccharide (PPSV23) vaccine. When PCV13 is also indicated, PCV13 should be obtained first. All adults aged 65 years and older should be immunized. An adult younger than age 65 years who has certain medical conditions should be immunized. Any person who resides in a nursing home or long-term care facility should be immunized. An adult smoker should be immunized. People with an immunocompromised condition and certain other conditions should receive both PCV13 and PPSV23 vaccines. People with human immunodeficiency virus (HIV) infection should be immunized as soon as possible after diagnosis. Immunization during chemotherapy or radiation therapy should be avoided. Routine use of PPSV23 vaccine is not recommended for American Indians, [place] Natives, or people younger than 65 years unless there are medical conditions that require PPSV23 vaccine. When indicated, people who have unknown immunization and have no record of immunization should receive PPSV23 vaccine. One-time revaccination 5 years after the first dose of PPSV23 is recommended for people aged 19-64 years who have chronic kidney failure, nephrotic syndrome, asplenia, or immunocompromised conditions. People who received 1-2 doses of PPSV23 before age 65 years should receive another dose of PPSV23 vaccine at age 65 years or later if at least 5 years have passed since the previous dose. Doses of PPSV23 are not  needed for people immunized with PPSV23 at or after age 65 years.  Meningococcal vaccine. Adults with asplenia or persistent complement component deficiencies should receive 2 doses of quadrivalent meningococcal conjugate (MenACWY-D) vaccine. The doses should be obtained   at least 2 months apart. Microbiologists working with certain meningococcal bacteria, [place] recruits, people at risk during an outbreak, and people who travel to or live in countries with a high rate of meningitis should be immunized. A first-year college student up through age 34 years who is living in a residence hall should receive a dose if she did not receive a dose on or after her 16th birthday. Adults who have certain high-risk conditions should receive one or more doses of vaccine.  Hepatitis A vaccine. Adults who wish to be protected from this disease, have certain high-risk conditions, work with hepatitis A-infected animals, work in hepatitis A research labs, or travel to or work in countries with a high rate of hepatitis A should be immunized. Adults who were previously unvaccinated and who anticipate close contact with an international adoptee during the first 60 days after arrival in the [place] from a country with a high rate of hepatitis A should be immunized.  Hepatitis B vaccine. Adults who wish to be protected from this disease, have certain high-risk conditions, may be exposed to blood or other infectious body fluids, are household contacts or sex partners of hepatitis B positive people, are clients or workers in certain care facilities, or travel to or work in countries with a high rate of hepatitis B should be immunized.  Haemophilus influenzae type b (Hib) vaccine. A previously unvaccinated person with asplenia or sickle cell disease or having a scheduled splenectomy should receive 1 dose of Hib vaccine. Regardless of previous immunization, a recipient of a hematopoietic stem cell transplant should receive a  3-dose series 6-12 months after her successful transplant. Hib vaccine is not recommended for adults with HIV infection. Preventive Services / Frequency Ages 35 to 4 years  Blood pressure check.** / Every 3-5 years.  Lipid and cholesterol check.** / Every 5 years beginning at age 60.  Clinical breast exam.** / Every 3 years for women in their 71s and 10s.  BRCA-related cancer risk assessment.** / For women who have family members with a BRCA-related cancer (breast, ovarian, tubal, or peritoneal cancers).  Pap test.** / Every 2 years from ages 76 through 26. Every 3 years starting at age 61 through age 76 or 93 with a history of 3 consecutive normal Pap tests.  HPV screening.** / Every 3 years from ages 37 through ages 60 to 51 with a history of 3 consecutive normal Pap tests.  Hepatitis C blood test.** / For any individual with known risks for hepatitis C.  Skin self-exam. / Monthly.  Influenza vaccine. / Every year.  Tetanus, diphtheria, and acellular pertussis (Tdap, Td) vaccine.** / Consult your health care provider. Pregnant women should receive 1 dose of Tdap vaccine during each pregnancy. 1 dose of Td every 10 years.  Varicella vaccine.** / Consult your health care provider. Pregnant females who do not have evidence of immunity should receive the first dose after pregnancy.  HPV vaccine. / 3 doses over 6 months, if 93 and younger. The vaccine is not recommended for use in pregnant females. However, pregnancy testing is not needed before receiving a dose.  Measles, mumps, rubella (MMR) vaccine.** / You need at least 1 dose of MMR if you were born in 1957 or later. You may also need a 2nd dose. For females of childbearing age, rubella immunity should be determined. If there is no evidence of immunity, females who are not pregnant should be vaccinated. If there is no evidence of immunity, females who are  pregnant should delay immunization until after pregnancy.  Pneumococcal  13-valent conjugate (PCV13) vaccine.** / Consult your health care provider.  Pneumococcal polysaccharide (PPSV23) vaccine.** / 1 to 2 doses if you smoke cigarettes or if you have certain conditions.  Meningococcal vaccine.** / 1 dose if you are age 68 to 8 years and a Market researcher living in a residence hall, or have one of several medical conditions, you need to get vaccinated against meningococcal disease. You may also need additional booster doses.  Hepatitis A vaccine.** / Consult your health care provider.  Hepatitis B vaccine.** / Consult your health care provider.  Haemophilus influenzae type b (Hib) vaccine.** / Consult your health care provider. Ages 7 to 53 years  Blood pressure check.** / Every year.  Lipid and cholesterol check.** / Every 5 years beginning at age 25 years.  Lung cancer screening. / Every year if you are aged 11-80 years and have a 30-pack-year history of smoking and currently smoke or have quit within the past 15 years. Yearly screening is stopped once you have quit smoking for at least 15 years or develop a health problem that would prevent you from having lung cancer treatment.  Clinical breast exam.** / Every year after age 48 years.  BRCA-related cancer risk assessment.** / For women who have family members with a BRCA-related cancer (breast, ovarian, tubal, or peritoneal cancers).  Mammogram.** / Every year beginning at age 41 years and continuing for as long as you are in good health. Consult with your health care provider.  Pap test.** / Every 3 years starting at age 65 years through age 37 or 70 years with a history of 3 consecutive normal Pap tests.  HPV screening.** / Every 3 years from ages 72 years through ages 60 to 40 years with a history of 3 consecutive normal Pap tests.  Fecal occult blood test (FOBT) of stool. / Every year beginning at age 21 years and continuing until age 5 years. You may not need to do this test if you get  a colonoscopy every 10 years.  Flexible sigmoidoscopy or colonoscopy.** / Every 5 years for a flexible sigmoidoscopy or every 10 years for a colonoscopy beginning at age 35 years and continuing until age 48 years.  Hepatitis C blood test.** / For all people born from [birth year] through 1965 and any individual with known risks for hepatitis C.  Skin self-exam. / Monthly.  Influenza vaccine. / Every year.  Tetanus, diphtheria, and acellular pertussis (Tdap/Td) vaccine.** / Consult your health care provider. Pregnant women should receive 1 dose of Tdap vaccine during each pregnancy. 1 dose of Td every 10 years.  Varicella vaccine.** / Consult your health care provider. Pregnant females who do not have evidence of immunity should receive the first dose after pregnancy.  Zoster vaccine.** / 1 dose for adults aged 30 years or older.  Measles, mumps, rubella (MMR) vaccine.** / You need at least 1 dose of MMR if you were born in 1957 or later. You may also need a second dose. For females of childbearing age, rubella immunity should be determined. If there is no evidence of immunity, females who are not pregnant should be vaccinated. If there is no evidence of immunity, females who are pregnant should delay immunization until after pregnancy.  Pneumococcal 13-valent conjugate (PCV13) vaccine.** / Consult your health care provider.  Pneumococcal polysaccharide (PPSV23) vaccine.** / 1 to 2 doses if you smoke cigarettes or if you have certain conditions.  Meningococcal vaccine.** /  Consult your health care provider.  Hepatitis A vaccine.** / Consult your health care provider.  Hepatitis B vaccine.** / Consult your health care provider.  Haemophilus influenzae type b (Hib) vaccine.** / Consult your health care provider. Ages 64 years and over  Blood pressure check.** / Every year.  Lipid and cholesterol check.** / Every 5 years beginning at age 23 years.  Lung cancer screening. / Every year if you  are aged 16-80 years and have a 30-pack-year history of smoking and currently smoke or have quit within the past 15 years. Yearly screening is stopped once you have quit smoking for at least 15 years or develop a health problem that would prevent you from having lung cancer treatment.  Clinical breast exam.** / Every year after age 74 years.  BRCA-related cancer risk assessment.** / For women who have family members with a BRCA-related cancer (breast, ovarian, tubal, or peritoneal cancers).  Mammogram.** / Every year beginning at age 44 years and continuing for as long as you are in good health. Consult with your health care provider.  Pap test.** / Every 3 years starting at age 58 years through age 22 or 39 years with 3 consecutive normal Pap tests. Testing can be stopped between 65 and 70 years with 3 consecutive normal Pap tests and no abnormal Pap or HPV tests in the past 10 years.  HPV screening.** / Every 3 years from ages 64 years through ages 70 or 61 years with a history of 3 consecutive normal Pap tests. Testing can be stopped between 65 and 70 years with 3 consecutive normal Pap tests and no abnormal Pap or HPV tests in the past 10 years.  Fecal occult blood test (FOBT) of stool. / Every year beginning at age 40 years and continuing until age 27 years. You may not need to do this test if you get a colonoscopy every 10 years.  Flexible sigmoidoscopy or colonoscopy.** / Every 5 years for a flexible sigmoidoscopy or every 10 years for a colonoscopy beginning at age 7 years and continuing until age 32 years.  Hepatitis C blood test.** / For all people born from [birth year] through 1964 and any individual with known risks for hepatitis C.  Osteoporosis screening.** / A one-time screening for women ages 30 years and over and women at risk for fractures or osteoporosis.  Skin self-exam. / Monthly.  Influenza vaccine. / Every year.  Tetanus, diphtheria, and acellular pertussis (Tdap/Td)  vaccine.** / 1 dose of Td every 10 years.  Varicella vaccine.** / Consult your health care provider.  Zoster vaccine.** / 1 dose for adults aged 35 years or older.  Pneumococcal 13-valent conjugate (PCV13) vaccine.** / Consult your health care provider.  Pneumococcal polysaccharide (PPSV23) vaccine.** / 1 dose for all adults aged 46 years and older.  Meningococcal vaccine.** / Consult your health care provider.  Hepatitis A vaccine.** / Consult your health care provider.  Hepatitis B vaccine.** / Consult your health care provider.  Haemophilus influenzae type b (Hib) vaccine.** / Consult your health care provider. ** Family history and personal history of risk and conditions may change your health care provider's recommendations.   This information is not intended to replace advice given to you by your health care provider. Make sure you discuss any questions you have with your health care provider.   Document Released: 02/03/2002 Document Revised: 12/29/2014 Document Reviewed: 05/05/2011 Elsevier Interactive Patient Education [employer].

## 2016-09-25 NOTE — Progress Notes (Signed)
Pre visit review using our clinic review tool, if applicable. No additional management support is needed unless otherwise documented below in the visit note. 

## 2016-09-26 ENCOUNTER — Encounter: Payer: Self-pay | Admitting: Family Medicine

## 2016-09-26 LAB — URINE CULTURE: ORGANISM ID, BACTERIA: NO GROWTH

## 2016-09-29 ENCOUNTER — Telehealth: Payer: Self-pay | Admitting: Family Medicine

## 2016-09-29 MED ORDER — ONETOUCH ULTRA BLUE VI STRP: ORAL_STRIP | 3 refills | Status: DC

## 2016-09-29 NOTE — Telephone Encounter (Signed)
Patient states that the pharmacy did not have her ONE TOUCH ULTRA TEST test strip. She is not completely out but will need them soon. Please advise.   Patient relation: self Patient phone: [phone] Pharmacy: [id number] - [place], [place]

## 2016-09-29 NOTE — Telephone Encounter (Signed)
Faxed.   KP 

## 2016-09-29 NOTE — Telephone Encounter (Signed)
Patient is aware the Rx has been re-faxed.    KP

## 2016-10-08 ENCOUNTER — Ambulatory Visit
Admission: RE | Admit: 2016-10-08 | Discharge: 2016-10-08 | Disposition: A | Discharge status: Home / Self Care | Payer: Medicare Other | Source: Ambulatory Visit | Attending: Family Medicine | Admitting: Family Medicine

## 2016-10-08 DIAGNOSIS — Z1231 Encounter for screening mammogram for malignant neoplasm of breast: Secondary | ICD-10-CM

## 2016-10-09 NOTE — Progress Notes (Signed)
[name], female   DOB: [birth year], 70 y.o.   MRN: [id number] Cynthia Burns returns today for followup. 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 Unfortunately she continues to be significant he overweight. We talked about this at length. He needs to assume a low carbohydrate diet. Lot of her social activities seem to revolve around food. She does have a treadmill at home but is sedentary. I talked to her about an exercise prescription including a heart rate of 130 beats per minute as the target. She is on diastolic but I think [id number] beats per minute his achievable given her current deconditioned state. 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 Last echo in January of 09 showed no significant progression.   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 Systolic function was  normal. The estimated ejection fraction was in the range of 60%  to 65%. Wall motion was normal; there were no regional wall  motion abnormalities. Doppler parameters are consistent with  abnormal left ventricular relaxation (grade 1 diastolic  dysfunction). There was no evidence of elevated ventricular  filling pressure by Doppler parameters. - Aortic valve: Trileaflet; normal thickness leaflets. There was no  regurgitation. - Aortic root: The aortic root was normal in  size. - Ascending aorta: The ascending aorta was normal in size. - Mitral valve: Structurally normal valve. There was no  regurgitation. - Left atrium: The atrium was normal in size. - Right ventricle: Systolic function was normal. - Right atrium: The atrium was normal in size. - Tricuspid valve: There was trivial regurgitation. - Pulmonic valve: There was no regurgitation. - Pulmonary arteries: Systolic pressure was within the normal  range. - Pericardium, extracardiac: There was no pericardial effusion.  Impressions:  - Aortic annular diameter is stable at 38 mm (normal value).  Having bouts of biliary colic Due to have GB surgery 11/5 with Dr Rosendo Gros   ROS: Denies fever, malais, weight loss, blurry vision, decreased visual acuity, cough, sputum, SOB, hemoptysis, pleuritic pain, palpitaitons, heartburn, abdominal pain, melena, lower extremity edema, claudication, or rash.  All other systems reviewed and negative  General: Affect appropriate Healthy:  appears stated age 70: normal Neck supple with no adenopathy JVP normal no bruits no thyromegaly Lungs clear with no wheezing and good diaphragmatic motion Heart:  S1/S2 no murmur, no rub, gallop or click PMI normal Abdomen: benighn, BS positve, no tenderness, no AAA no bruit.  No HSM or HJR Distal pulses intact with no bruits No edema Neuro non-focal Skin warm and dry No muscular weakness   Current Outpatient Prescriptions  Medication Sig Dispense Refill  . aspirin EC 81 MG tablet Take 1 tablet (81 mg total) by mouth daily. 90 tablet 3  . Biotin (PA BIOTIN) 1000 MCG tablet Take 1,000 mcg by mouth daily.    . Cyanocobalamin (VITAMIN  B-12 PO) Take 1 tablet by mouth daily.     . furosemide (LASIX) 20 MG tablet Take 0.5 tablets (10 mg total) by mouth daily. 45 tablet 3  . losartan (COZAAR) 25 MG tablet Take 1 tablet (25 mg total) by mouth daily. 90 tablet 3  . meclizine (ANTIVERT) 25 MG tablet Take 1 tablet (25 mg  total) by mouth as needed for dizziness. 30 tablet 5  . metFORMIN (GLUCOPHAGE-XR) 500 MG 24 hr tablet Take 2 tablets (1,000 mg total) by mouth 2 (two) times daily. 360 tablet 1  . nebivolol (BYSTOLIC) 5 MG tablet Take 1 tablet (5 mg total) by mouth daily. 90 tablet 3  . omeprazole (PRILOSEC) 40 MG capsule Take 1 capsule (40 mg total) by mouth daily. 30 capsule 3  . ONE TOUCH ULTRA TEST test strip CHECK BLOOD SUGAR DAILY E11.9 100 each 3  . rosuvastatin (CRESTOR) 20 MG tablet Take 1 tablet (20 mg total) by mouth daily.    Marland Kitchen VITAMIN D, CHOLECALCIFEROL, PO Take 1 tablet by mouth daily.      No current facility-administered medications for this visit.     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  Target hemoglobin A1c is 6.5 or less.  Continue current medications. Obesity: Exercise and low carb diet discussed Chol:  Lab Results  Component Value Date   LDLCALC 43 09/25/2016   HTN:  Well controlled.  Continue current medications and low sodium Dash type diet.   Edema: stable can consider adding diuretic in future if needed  GB: ok to have surgery should do fine   Cynthia Burns

## 2016-10-10 ENCOUNTER — Encounter (INDEPENDENT_AMBULATORY_CARE_PROVIDER_SITE_OTHER): Payer: Self-pay

## 2016-10-10 ENCOUNTER — Encounter: Payer: Self-pay | Admitting: Cardiovascular Disease

## 2016-10-10 ENCOUNTER — Ambulatory Visit (INDEPENDENT_AMBULATORY_CARE_PROVIDER_SITE_OTHER): Payer: Medicare Other | Admitting: Cardiovascular Disease

## 2016-10-10 ENCOUNTER — Other Ambulatory Visit: Payer: Self-pay

## 2016-10-10 ENCOUNTER — Ambulatory Visit (HOSPITAL_COMMUNITY): Payer: Medicare Other | Attending: Cardiology

## 2016-10-10 VITALS — BP 120/60 | HR 63 | Ht 62.0 in | Wt 135.6 lb

## 2016-10-10 DIAGNOSIS — E785 Hyperlipidemia, unspecified: Secondary | ICD-10-CM

## 2016-10-10 DIAGNOSIS — Q2543 Congenital aneurysm of aorta: Secondary | ICD-10-CM

## 2016-10-10 DIAGNOSIS — Q2549 Other congenital malformations of aorta: Secondary | ICD-10-CM

## 2016-10-10 DIAGNOSIS — I10 Essential (primary) hypertension: Secondary | ICD-10-CM | POA: Diagnosis not present

## 2016-10-10 NOTE — Patient Instructions (Addendum)

## 2016-10-27 ENCOUNTER — Other Ambulatory Visit: Payer: Self-pay | Admitting: General Surgery

## 2016-10-27 DIAGNOSIS — K8010 Calculus of gallbladder with chronic cholecystitis without obstruction: Secondary | ICD-10-CM | POA: Diagnosis not present

## 2016-10-27 DIAGNOSIS — K811 Chronic cholecystitis: Secondary | ICD-10-CM | POA: Diagnosis not present

## 2017-02-02 ENCOUNTER — Telehealth: Payer: Self-pay | Admitting: Family Medicine

## 2017-02-02 DIAGNOSIS — IMO0002 Reserved for concepts with insufficient information to code with codable children: Secondary | ICD-10-CM

## 2017-02-02 DIAGNOSIS — E1165 Type 2 diabetes mellitus with hyperglycemia: Principal | ICD-10-CM

## 2017-02-02 DIAGNOSIS — E1151 Type 2 diabetes mellitus with diabetic peripheral angiopathy without gangrene: Secondary | ICD-10-CM

## 2017-02-02 NOTE — Telephone Encounter (Signed)
Relation to [id number]  Call back number: [phone]  Pharmacy: [place], [place] [phone] ([phone] (Fax)     Reason for call:  Patient requesting a refill metFORMIN (GLUCOPHAGE-XR) 500 MG 24 hr tablet, lancets and test strips

## 2017-02-03 MED ORDER — METFORMIN HCL ER 500 MG PO TB24: 1000.0000 mg | ORAL_TABLET | Freq: Two times a day (BID) | ORAL | 1 refills | Status: DC

## 2017-02-03 NOTE — Telephone Encounter (Signed)
Patient informed that prescription sent in as requested.

## 2017-03-23 ENCOUNTER — Encounter: Payer: Self-pay | Admitting: Family Medicine

## 2017-03-23 ENCOUNTER — Ambulatory Visit (INDEPENDENT_AMBULATORY_CARE_PROVIDER_SITE_OTHER): Payer: Medicare Other | Admitting: Family Medicine

## 2017-03-23 VITALS — BP 116/66 | HR 64 | Temp 98.2°F | Resp 16 | Ht 62.0 in | Wt 142.4 lb

## 2017-03-23 DIAGNOSIS — E119 Type 2 diabetes mellitus without complications: Secondary | ICD-10-CM

## 2017-03-23 DIAGNOSIS — E785 Hyperlipidemia, unspecified: Secondary | ICD-10-CM | POA: Diagnosis not present

## 2017-03-23 DIAGNOSIS — I10 Essential (primary) hypertension: Secondary | ICD-10-CM

## 2017-03-23 DIAGNOSIS — E1165 Type 2 diabetes mellitus with hyperglycemia: Secondary | ICD-10-CM

## 2017-03-23 DIAGNOSIS — Z23 Encounter for immunization: Secondary | ICD-10-CM | POA: Diagnosis not present

## 2017-03-23 DIAGNOSIS — IMO0002 Reserved for concepts with insufficient information to code with codable children: Secondary | ICD-10-CM

## 2017-03-23 DIAGNOSIS — E1151 Type 2 diabetes mellitus with diabetic peripheral angiopathy without gangrene: Secondary | ICD-10-CM | POA: Diagnosis not present

## 2017-03-23 LAB — COMPREHENSIVE METABOLIC PANEL
ALK PHOS: 40 U/L (ref 39–117)
ALT: 14 U/L (ref 0–35)
AST: 15 U/L (ref 0–37)
Albumin: 4.4 g/dL (ref 3.5–5.2)
BILIRUBIN TOTAL: 0.3 mg/dL (ref 0.2–1.2)
BUN: 10 mg/dL (ref 6–23)
CALCIUM: 9.5 mg/dL (ref 8.4–10.5)
CO2: 30 meq/L (ref 19–32)
Chloride: 104 mEq/L (ref 96–112)
Creatinine, Ser: 0.72 mg/dL (ref 0.40–1.20)
GFR: 85.3 mL/min (ref >60.00)
GLUCOSE: 153 mg/dL — AB (ref 70–99)
POTASSIUM: 4 meq/L (ref 3.5–5.1)
Sodium: 142 mEq/L (ref 135–145)
TOTAL PROTEIN: 6.8 g/dL (ref 6.0–8.3)

## 2017-03-23 LAB — LIPID PANEL
Cholesterol: 118 mg/dL (ref 0–200)
HDL: 44.2 mg/dL (ref >39.00)
LDL Cholesterol: 51 mg/dL (ref 0–99)
NONHDL: 73.74
TRIGLYCERIDES: 115 mg/dL (ref 0.0–149.0)
Total CHOL/HDL Ratio: 3
VLDL: 23 mg/dL (ref 0.0–40.0)

## 2017-03-23 LAB — HEMOGLOBIN A1C: HEMOGLOBIN A1C: 7.1 % — AB (ref 4.6–6.5)

## 2017-03-23 MED ORDER — ZOSTER VAC RECOMB ADJUVANTED 50 MCG/0.5ML IM SUSR: 0.5000 mL | Freq: Once | INTRAMUSCULAR | 1 refills | Status: AC

## 2017-03-23 MED ORDER — ROSUVASTATIN CALCIUM 20 MG PO TABS: 20.0000 mg | ORAL_TABLET | Freq: Every day | ORAL | 1 refills | Status: DC

## 2017-03-23 MED ORDER — LOSARTAN POTASSIUM 25 MG PO TABS: 25.0000 mg | ORAL_TABLET | Freq: Every day | ORAL | 3 refills | Status: DC

## 2017-03-23 MED ORDER — NEBIVOLOL HCL 5 MG PO TABS: 5.0000 mg | ORAL_TABLET | Freq: Every day | ORAL | 3 refills | Status: DC

## 2017-03-23 MED ORDER — ONETOUCH ULTRASOFT LANCETS MISC: 3 refills | Status: DC

## 2017-03-23 NOTE — Assessment & Plan Note (Signed)
Well controlled, no changes to meds. Encouraged heart healthy diet such as the DASH diet and exercise as tolerated.  °

## 2017-03-23 NOTE — Progress Notes (Signed)
Pre visit review using our clinic review tool, if applicable. No additional management support is needed unless otherwise documented below in the visit note. 

## 2017-03-23 NOTE — Patient Instructions (Signed)
Carbohydrate Counting for Diabetes Mellitus, Adult Carbohydrate counting is a method for keeping track of how many carbohydrates you eat. Eating carbohydrates naturally increases the amount of sugar (glucose) in the blood. Counting how many carbohydrates you eat helps keep your blood glucose within normal limits, which helps you manage your diabetes (diabetes mellitus). It is important to know how many carbohydrates you can safely have in each meal. This is different for every person. A diet and nutrition specialist (registered dietitian) can help you make a meal plan and calculate how many carbohydrates you should have at each meal and snack. Carbohydrates are found in the following foods:  Grains, such as breads and cereals.  Dried beans and soy products.  Starchy vegetables, such as potatoes, peas, and corn.  Fruit and fruit juices.  Milk and yogurt.  Sweets and snack foods, such as cake, cookies, candy, chips, and soft drinks. How do I count carbohydrates? There are two ways to count carbohydrates in food. You can use either of the methods or a combination of both. Reading "Nutrition Facts" on packaged food  The "Nutrition Facts" list is included on the labels of almost all packaged foods and beverages in the [place] It includes:  The serving size.  Information about nutrients in each serving, including the grams (g) of carbohydrate per serving. To use the "Nutrition Facts":  Decide how many servings you will have.  Multiply the number of servings by the number of carbohydrates per serving.  The resulting number is the total amount of carbohydrates that you will be having. Learning standard serving sizes of other foods  When you eat foods containing carbohydrates that are not packaged or do not include "Nutrition Facts" on the label, you need to measure the servings in order to count the amount of carbohydrates:  Measure the foods that you will eat with a food scale or measuring  cup, if needed.  Decide how many standard-size servings you will eat.  Multiply the number of servings by 15. Most carbohydrate-rich foods have about 15 g of carbohydrates per serving.  For example, if you eat 8 oz (170 g) of strawberries, you will have eaten 2 servings and 30 g of carbohydrates (2 servings x 15 g = 30 g).  For foods that have more than one food mixed, such as soups and casseroles, you must count the carbohydrates in each food that is included. The following list contains standard serving sizes of common carbohydrate-rich foods. Each of these servings has about 15 g of carbohydrates:   hamburger bun or  English muffin.   oz (15 mL) syrup.   oz (14 g) jelly.  1 slice of bread.  1 six-inch tortilla.  3 oz (85 g) cooked rice or pasta.  4 oz (113 g) cooked dried beans.  4 oz (113 g) starchy vegetable, such as peas, corn, or potatoes.  4 oz (113 g) hot cereal.  4 oz (113 g) mashed potatoes or  of a large baked potato.  4 oz (113 g) canned or frozen fruit.  4 oz (120 mL) fruit juice.  4-6 crackers.  6 chicken nuggets.  6 oz (170 g) unsweetened dry cereal.  6 oz (170 g) plain fat-free yogurt or yogurt sweetened with artificial sweeteners.  8 oz (240 mL) milk.  8 oz (170 g) fresh fruit or one small piece of fruit.  24 oz (680 g) popped popcorn. Example of carbohydrate counting Sample meal  3 oz (85 g) chicken breast.  6 oz (  170 g) brown rice.  4 oz (113 g) corn.  8 oz (240 mL) milk.  8 oz (170 g) strawberries with sugar-free whipped topping. Carbohydrate calculation 1. Identify the foods that contain carbohydrates:  Rice.  Corn.  Milk.  Strawberries. 2. Calculate how many servings you have of each food:  2 servings rice.  1 serving corn.  1 serving milk.  1 serving strawberries. 3. Multiply each number of servings by 15 g:  2 servings rice x 15 g = 30 g.  1 serving corn x 15 g = 15 g.  1 serving milk x 15 g = 15  g.  1 serving strawberries x 15 g = 15 g. 4. Add together all of the amounts to find the total grams of carbohydrates eaten:  30 g + 15 g + 15 g + 15 g = 75 g of carbohydrates total. This information is not intended to replace advice given to you by your health care provider. Make sure you discuss any questions you have with your health care provider. Document Released: 12/08/2005 Document Revised: 06/27/2016 Document Reviewed: 05/21/2016 Elsevier Interactive Patient Education  2017 [employer]  

## 2017-03-23 NOTE — Assessment & Plan Note (Signed)
Tolerating statin, encouraged heart healthy diet, avoid trans fats, minimize simple carbs and saturated fats. Increase exercise as tolerated 

## 2017-03-23 NOTE — Progress Notes (Signed)
[name], female   DOB: 1946-11-20, 71 y.o.   MRN: [id number]    Subjective:  I acted as a Education administrator for Dr. Carollee Herter.  Cynthia Burns, [place]   [name], female    DOB: 05-26-1946, 71 y.o.   MRN: [id number]  Chief Complaint  Patient presents with  . Hypertension  . Hyperlipidemia  . Diabetes    HPI  Patient is in today for follow up diabetes, cholesterol, and blood pressure.  Had gallbladder removed in November 2017.  Has been doing well otherwise.  She says her blood sugars have been going up since gb surgery but she has been able to eat more.     Patient Care Team: Ann Held, DO as PCP - General Josue Hector, MD as Consulting Physician (Cardiology) Shon Hough, MD as Consulting Physician (Ophthalmology) Lafayette Dragon, MD (Inactive) (Gastroenterology) Mcarthur Rossetti, MD as Consulting Physician (Orthopedic Surgery)   Past Medical History:  Diagnosis Date  . Diabetes mellitus without complication ([facility])   . 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 HYPERTENSION   . MITRAL VALVE PROLAPSE   . OSTEOPENIA   . Overweight(278.02)   . PHARYNGITIS, ACUTE     Past Surgical History:  Procedure Laterality Date  . ABDOMINAL HYSTERECTOMY    . CHOLECYSTECTOMY      Family History  Problem Relation Age of Onset  . Emphysema Father   . Diabetes Father   . Heart disease Mother     Had a stent  . Hypertension    . Colon cancer Neg Hx     Social History   Social History  . Marital status: Married    Spouse name: N/A  . Number of children: N/A  . Years of education: N/A   Occupational History  . Not on file.   Social History Main Topics  . Smoking status: Never Smoker  . Smokeless tobacco: Never Used  . Alcohol use No  . Drug use: No  . Sexual activity: Not on file   Other Topics Concern  . Not on file   Social History Narrative  . No narrative on file    Outpatient Medications Prior to Visit  Medication Sig  Dispense Refill  . aspirin EC 81 MG tablet Take 1 tablet (81 mg total) by mouth daily. 90 tablet 3  . Biotin (PA BIOTIN) 1000 MCG tablet Take 1,000 mcg by mouth daily.    . Cyanocobalamin (VITAMIN B-12 PO) Take 1 tablet by mouth daily.     . furosemide (LASIX) 20 MG tablet Take 0.5 tablets (10 mg total) by mouth daily. 45 tablet 3  . meclizine (ANTIVERT) 25 MG tablet Take 1 tablet (25 mg total) by mouth as needed for dizziness. 30 tablet 5  . metFORMIN (GLUCOPHAGE-XR) 500 MG 24 hr tablet Take 2 tablets (1,000 mg total) by mouth 2 (two) times daily. 360 tablet 1  . omeprazole (PRILOSEC) 40 MG capsule Take 1 capsule (40 mg total) by mouth daily. 30 capsule 3  . ONE TOUCH ULTRA TEST test strip CHECK BLOOD SUGAR DAILY E11.9 100 each 3  . VITAMIN D, CHOLECALCIFEROL, PO Take 1 tablet by mouth daily.     Marland Kitchen losartan (COZAAR) 25 MG tablet Take 1 tablet (25 mg total) by mouth daily. 90 tablet 3  . nebivolol (BYSTOLIC) 5 MG tablet Take 1 tablet (5 mg total) by mouth daily. 90 tablet 3  . rosuvastatin (CRESTOR) 20 MG  tablet Take 1 tablet (20 mg total) by mouth daily.     No facility-administered medications prior to visit.     Allergies  Allergen Reactions  . Mucinex [Guaifenesin Er] Anaphylaxis  . Codeine Nausea And Vomiting  . Advicor [Niacin-Lovastatin Er] Rash  . Amlodipine Rash  . Lisinopril-Hydrochlorothiazide Rash  . Other Rash    Eye drops with preservatives.    . Ramipril Rash  . Sulfonamide Derivatives Rash    Review of Systems  Constitutional: Negative for fever and malaise/fatigue.  HENT: Negative for congestion.   Eyes: Negative for blurred vision.  Respiratory: Negative for cough and shortness of breath.   Cardiovascular: Negative for chest pain, palpitations and leg swelling.  Gastrointestinal: Negative for vomiting.  Musculoskeletal: Negative for back pain.  Skin: Negative for rash.  Neurological: Negative for loss of consciousness and headaches.       Objective:      Physical Exam  Constitutional: She is oriented to person, place, and time. She appears well-developed and well-nourished. No distress.  HENT:  Head: Normocephalic and atraumatic.  Eyes: Conjunctivae are normal.  Neck: Normal range of motion. No thyromegaly present.  Cardiovascular: Normal rate and regular rhythm.   Pulmonary/Chest: Effort normal and breath sounds normal. She has no wheezes.  Abdominal: Soft. Bowel sounds are normal. There is no tenderness.  Musculoskeletal: Normal range of motion. She exhibits no edema or deformity.  Neurological: She is alert and oriented to person, place, and time.  Skin: Skin is warm and dry. She is not diaphoretic.  Psychiatric: She has a normal mood and affect.    BP 116/66 (BP Location: Left Arm, Cuff Size: Normal)   Pulse 64   Temp 98.2 F (36.8 C) (Oral)   Resp 16   Ht 5\' 2"  (1.575 m)   Wt 142 lb 6.4 oz (64.6 kg)   SpO2 98%   BMI 26.05 kg/m  Wt Readings from Last 3 Encounters:  03/23/17 142 lb 6.4 oz (64.6 kg)  10/10/16 135 lb 9.6 oz (61.5 kg)  09/25/16 140 lb 6.4 oz (63.7 kg)   BP Readings from Last 3 Encounters:  03/23/17 116/66  10/10/16 120/60  09/25/16 116/72     Immunization History  Administered Date(s) Administered  . Influenza Whole 10/20/2007  . Influenza, High Dose Seasonal PF 09/01/2016  . Influenza,inj,Quad PF,36+ Mos 09/05/2013, 09/05/2014, 09/13/2015  . Pneumococcal Conjugate-13 02/27/2014  . Pneumococcal Polysaccharide-23 09/06/2011  . Zoster 09/14/2013    Health Maintenance  Topic Date Due  . PNA vac Low Risk Adult (2 of 2 - PPSV23) 09/05/2016  . HEMOGLOBIN A1C  03/26/2017  . OPHTHALMOLOGY EXAM  05/12/2017  . INFLUENZA VACCINE  07/22/2017  . FOOT EXAM  09/25/2017  . MAMMOGRAM  10/08/2018  . COLONOSCOPY  11/09/2023  . TETANUS/TDAP  09/13/2024  . DEXA SCAN  Completed  . Hepatitis C Screening  Completed    Lab Results  Component Value Date   WBC 6.0 09/25/2016   HGB 13.0 09/25/2016   HCT 38.8  09/25/2016   PLT 257.0 09/25/2016   GLUCOSE 115 (H) 09/25/2016   CHOL 103 09/25/2016   TRIG 96.0 09/25/2016   HDL 41.00 09/25/2016   LDLDIRECT 78.3 07/12/2013   LDLCALC 43 09/25/2016   ALT 13 09/25/2016   AST 17 09/25/2016   NA 141 09/25/2016   K 4.0 09/25/2016   CL 106 09/25/2016   CREATININE 0.78 09/25/2016   BUN 15 09/25/2016   CO2 29 09/25/2016   HGBA1C 6.8 (H) 09/25/2016  MICROALBUR 1.9 09/13/2015    No results found for: TSH Lab Results  Component Value Date   WBC 6.0 09/25/2016   HGB 13.0 09/25/2016   HCT 38.8 09/25/2016   MCV 88.1 09/25/2016   PLT 257.0 09/25/2016   Lab Results  Component Value Date   NA 141 09/25/2016   K 4.0 09/25/2016   CO2 29 09/25/2016   GLUCOSE 115 (H) 09/25/2016   BUN 15 09/25/2016   CREATININE 0.78 09/25/2016   BILITOT 0.4 09/25/2016   ALKPHOS 37 (L) 09/25/2016   AST 17 09/25/2016   ALT 13 09/25/2016   PROT 6.8 09/25/2016   ALBUMIN 4.1 09/25/2016   CALCIUM 9.0 09/25/2016   GFR 77.88 09/25/2016   Lab Results  Component Value Date   CHOL 103 09/25/2016   Lab Results  Component Value Date   HDL 41.00 09/25/2016   Lab Results  Component Value Date   LDLCALC 43 09/25/2016   Lab Results  Component Value Date   TRIG 96.0 09/25/2016   Lab Results  Component Value Date   CHOLHDL 3 09/25/2016   Lab Results  Component Value Date   HGBA1C 6.8 (H) 09/25/2016         Assessment & Plan:   Problem List Items Addressed This Visit      Unprioritized   Essential hypertension    Well controlled, no changes to meds. Encouraged heart healthy diet such as the DASH diet and exercise as tolerated.       Relevant Medications   rosuvastatin (CRESTOR) 20 MG tablet   losartan (COZAAR) 25 MG tablet   nebivolol (BYSTOLIC) 5 MG tablet   Other Relevant Orders   Comprehensive metabolic panel   Hyperlipidemia LDL goal <70    Tolerating statin, encouraged heart healthy diet, avoid trans fats, minimize simple carbs and saturated  fats. Increase exercise as tolerated      Relevant Medications   rosuvastatin (CRESTOR) 20 MG tablet   losartan (COZAAR) 25 MG tablet   nebivolol (BYSTOLIC) 5 MG tablet   Other Relevant Orders   Comprehensive metabolic panel   Lipid panel   Type 2 diabetes mellitus not at goal [facility])    hgba1c acceptable, minimize simple carbs. Increase exercise as tolerated. Continue current meds       Relevant Medications   rosuvastatin (CRESTOR) 20 MG tablet   losartan (COZAAR) 25 MG tablet    Other Visit Diagnoses    DM (diabetes mellitus) type II uncontrolled, periph vascular disorder (HCC)    -  Primary   Relevant Medications   rosuvastatin (CRESTOR) 20 MG tablet   Lancets (ONETOUCH ULTRASOFT) lancets   losartan (COZAAR) 25 MG tablet   nebivolol (BYSTOLIC) 5 MG tablet   Other Relevant Orders   Hemoglobin A1c   Need for shingles vaccine          I am having Ms. Naim start on [employer] and Zoster Vac Recomb Adjuvanted. I am also having her maintain her aspirin EC, Cyanocobalamin (VITAMIN B-12 PO), (VITAMIN D, CHOLECALCIFEROL, PO), Biotin, meclizine, furosemide, omeprazole, ONE TOUCH ULTRA TEST, metFORMIN, rosuvastatin, losartan, and nebivolol.  Meds ordered this encounter  Medications  . rosuvastatin (CRESTOR) 20 MG tablet    Sig: Take 1 tablet (20 mg total) by mouth daily.    Dispense:  90 tablet    Refill:  1  . Lancets (ONETOUCH ULTRASOFT) lancets    Sig: Use as instructed once daily.  Dx Code: E11.9    Dispense:  100 each  Refill:  3  . Zoster Vac Recomb Adjuvanted [facility]) injection    Sig: Inject 0.5 mLs into the muscle once.    Dispense:  1 each    Refill:  1  . losartan (COZAAR) 25 MG tablet    Sig: Take 1 tablet (25 mg total) by mouth daily.    Dispense:  90 tablet    Refill:  3  . nebivolol (BYSTOLIC) 5 MG tablet    Sig: Take 1 tablet (5 mg total) by mouth daily.    Dispense:  90 tablet    Refill:  3    CMA served as scribe during this visit.  History, Physical and Plan performed by medical provider. Documentation and orders reviewed and attested to.  Ann Held, DO

## 2017-03-23 NOTE — Assessment & Plan Note (Signed)
hgba1c acceptable, minimize simple carbs. Increase exercise as tolerated. Continue current meds 

## 2017-03-26 ENCOUNTER — Telehealth: Payer: Self-pay | Admitting: Family Medicine

## 2017-03-26 NOTE — Telephone Encounter (Signed)
Pharmacy updated and patient notified.  She also wanted to let her know that her shingles vaccine was going to cost her 73.00.

## 2017-03-26 NOTE — Telephone Encounter (Signed)
Caller name: Danyeal  Relation to pt: self  Call back number: [phone] Pharmacy:  Reason for call: Pt states all rx are needing to be processed to [facility] [place], [place] Tel [phone]. - pt just wanted to update on her chart since she saw on her AVS stating the preferred pharmacy is [place], [place] which is no longer the [place], [place].  Please update it on chart.

## 2017-04-06 ENCOUNTER — Other Ambulatory Visit: Payer: Self-pay | Admitting: Family Medicine

## 2017-04-06 NOTE — Telephone Encounter (Signed)
Caller name: Relationship to patient: Self Can be reached: [phone]  Pharmacy:  [facility] [place], [place] [address] [phone] (Phone) [phone] (Fax)     Reason for call: Refill ONE TOUCH ULTRA TEST test strip [

## 2017-04-07 MED ORDER — ONETOUCH ULTRA BLUE VI STRP: ORAL_STRIP | 3 refills | Status: DC

## 2017-04-22 ENCOUNTER — Other Ambulatory Visit: Payer: Self-pay | Admitting: *Deleted

## 2017-04-22 DIAGNOSIS — I10 Essential (primary) hypertension: Secondary | ICD-10-CM

## 2017-04-22 MED ORDER — FUROSEMIDE 20 MG PO TABS: 10.0000 mg | ORAL_TABLET | Freq: Every day | ORAL | 1 refills | Status: DC

## 2017-04-22 MED ORDER — NEBIVOLOL HCL 5 MG PO TABS: 5.0000 mg | ORAL_TABLET | Freq: Every day | ORAL | 1 refills | Status: DC

## 2017-04-22 MED ORDER — LOSARTAN POTASSIUM 25 MG PO TABS: 25.0000 mg | ORAL_TABLET | Freq: Every day | ORAL | 1 refills | Status: DC

## 2017-05-11 DIAGNOSIS — H524 Presbyopia: Secondary | ICD-10-CM | POA: Diagnosis not present

## 2017-05-11 DIAGNOSIS — E119 Type 2 diabetes mellitus without complications: Secondary | ICD-10-CM | POA: Diagnosis not present

## 2017-05-11 DIAGNOSIS — H25813 Combined forms of age-related cataract, bilateral: Secondary | ICD-10-CM | POA: Diagnosis not present

## 2017-05-11 LAB — HM DIABETES EYE EXAM

## 2017-05-20 ENCOUNTER — Encounter: Payer: Self-pay | Admitting: *Deleted

## 2017-06-22 ENCOUNTER — Other Ambulatory Visit: Payer: Self-pay | Admitting: Emergency Medicine

## 2017-06-22 ENCOUNTER — Other Ambulatory Visit (INDEPENDENT_AMBULATORY_CARE_PROVIDER_SITE_OTHER): Payer: Medicare Other

## 2017-06-22 DIAGNOSIS — R739 Hyperglycemia, unspecified: Secondary | ICD-10-CM

## 2017-06-22 DIAGNOSIS — I10 Essential (primary) hypertension: Secondary | ICD-10-CM

## 2017-06-22 LAB — LIPID PANEL
CHOL/HDL RATIO: 3
Cholesterol: 110 mg/dL (ref 0–200)
HDL: 43.6 mg/dL (ref >39.00)
LDL CALC: 46 mg/dL (ref 0–99)
NONHDL: 66.73
Triglycerides: 106 mg/dL (ref 0.0–149.0)
VLDL: 21.2 mg/dL (ref 0.0–40.0)

## 2017-06-22 LAB — COMPREHENSIVE METABOLIC PANEL
ALT: 16 U/L (ref 0–35)
AST: 19 U/L (ref 0–37)
Albumin: 4.2 g/dL (ref 3.5–5.2)
Alkaline Phosphatase: 36 U/L — ABNORMAL LOW (ref 39–117)
BUN: 12 mg/dL (ref 6–23)
CALCIUM: 9.1 mg/dL (ref 8.4–10.5)
CHLORIDE: 105 meq/L (ref 96–112)
CO2: 30 meq/L (ref 19–32)
CREATININE: 0.79 mg/dL (ref 0.40–1.20)
GFR: 76.58 mL/min (ref >60.00)
Glucose, Bld: 120 mg/dL — ABNORMAL HIGH (ref 70–99)
POTASSIUM: 3.7 meq/L (ref 3.5–5.1)
Sodium: 142 mEq/L (ref 135–145)
Total Bilirubin: 0.4 mg/dL (ref 0.2–1.2)
Total Protein: 6.8 g/dL (ref 6.0–8.3)

## 2017-06-22 LAB — HEMOGLOBIN A1C: Hgb A1c MFr Bld: 6.9 % — ABNORMAL HIGH (ref 4.6–6.5)

## 2017-07-27 ENCOUNTER — Telehealth: Payer: Self-pay | Admitting: Family Medicine

## 2017-07-27 DIAGNOSIS — IMO0002 Reserved for concepts with insufficient information to code with codable children: Secondary | ICD-10-CM

## 2017-07-27 DIAGNOSIS — E1151 Type 2 diabetes mellitus with diabetic peripheral angiopathy without gangrene: Secondary | ICD-10-CM

## 2017-07-27 DIAGNOSIS — E1165 Type 2 diabetes mellitus with hyperglycemia: Principal | ICD-10-CM

## 2017-07-27 MED ORDER — METFORMIN HCL ER 500 MG PO TB24: 1000.0000 mg | ORAL_TABLET | Freq: Two times a day (BID) | ORAL | 1 refills | Status: DC

## 2017-07-27 NOTE — Telephone Encounter (Signed)
Refill done and patient notified. 

## 2017-07-27 NOTE — Telephone Encounter (Signed)
Caller name:Kharisma Domeier Relationship to patient: Can be [phone] Pharmacy:[facility]  Reason for call:Requesting refill on metformin, needs a 90 day supply

## 2017-08-31 ENCOUNTER — Other Ambulatory Visit: Payer: Self-pay | Admitting: Family Medicine

## 2017-08-31 DIAGNOSIS — Z1231 Encounter for screening mammogram for malignant neoplasm of breast: Secondary | ICD-10-CM

## 2017-09-21 DIAGNOSIS — Z23 Encounter for immunization: Secondary | ICD-10-CM | POA: Diagnosis not present

## 2017-09-28 ENCOUNTER — Encounter: Payer: Medicare Other | Admitting: Family Medicine

## 2017-09-28 ENCOUNTER — Telehealth: Payer: Self-pay | Admitting: Family Medicine

## 2017-09-28 NOTE — Telephone Encounter (Signed)
Caller name: Relation to TP:NSQZ Call back [phone] Pharmacy:  Reason for call: pt had appt today for her CPE with dr. Etter Sjogren, pt would like a call back to reschedule, dr. Etter Sjogren schedule will not allow any more cpe appts, pt states she is diabetic and can not go without eating pt is requesting a morning appt please advise.

## 2017-10-09 ENCOUNTER — Telehealth: Payer: Self-pay | Admitting: Family Medicine

## 2017-10-09 ENCOUNTER — Encounter: Payer: Self-pay | Admitting: Cardiovascular Disease

## 2017-10-09 NOTE — Telephone Encounter (Signed)
Spoke with pt regarding AWV. Cynthia Burns declined AWV. Pt stated that she wanted to  r/s CPE from 09/28/2017.  Pt has traditional Medicare  Informed pt that Medicare does not pay for traditional CPE. Per AVS (03/23/17) pt needs 6 month f/u. Scheduled pt for 11/19/17 at 9:30. Pt also requested to have labs drawn. SF

## 2017-10-12 ENCOUNTER — Ambulatory Visit
Admission: RE | Admit: 2017-10-12 | Discharge: 2017-10-12 | Disposition: A | Discharge status: Home / Self Care | Payer: Medicare Other | Source: Ambulatory Visit | Attending: Family Medicine | Admitting: Family Medicine

## 2017-10-12 DIAGNOSIS — Z1231 Encounter for screening mammogram for malignant neoplasm of breast: Secondary | ICD-10-CM | POA: Diagnosis not present

## 2017-10-21 ENCOUNTER — Telehealth: Payer: Self-pay | Admitting: Family Medicine

## 2017-10-21 ENCOUNTER — Other Ambulatory Visit: Payer: Self-pay | Admitting: Family Medicine

## 2017-10-21 DIAGNOSIS — E1151 Type 2 diabetes mellitus with diabetic peripheral angiopathy without gangrene: Secondary | ICD-10-CM

## 2017-10-21 DIAGNOSIS — E1165 Type 2 diabetes mellitus with hyperglycemia: Principal | ICD-10-CM

## 2017-10-21 DIAGNOSIS — IMO0002 Reserved for concepts with insufficient information to code with codable children: Secondary | ICD-10-CM

## 2017-10-21 MED ORDER — ROSUVASTATIN CALCIUM 20 MG PO TABS: 20.0000 mg | ORAL_TABLET | Freq: Every day | ORAL | 1 refills | Status: DC

## 2017-10-21 NOTE — Telephone Encounter (Signed)
Patient needs new order of Crestor sent to [facility] [place] - [address]  Her CPE was rescheduled out for the end of Nov.

## 2017-10-21 NOTE — Progress Notes (Signed)
[name], female   DOB: July 30, 1946, 71 y.o.   MRN: [id number] [name] returns today for followup. 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 Unfortunately she continues to be significant he overweight. We talked about this at length. He needs to assume a low carbohydrate diet. Lot of her social activities seem to revolve around food. She does have a treadmill at home but is sedentary. I talked to her about an exercise prescription including a heart rate of 130 beats per minute as the target.  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    Echo 10/10/16 ok normal root no AR reviewed   Study Conclusions  - Left ventricle: The cavity size was normal. There was mild   concentric hypertrophy. Systolic function was vigorous. The   estimated ejection fraction was in the range of 65% to 70%. Wall   motion was normal; there were no regional wall motion   abnormalities. Doppler parameters are consistent with abnormal   left ventricular relaxation (grade 1 diastolic dysfunction).   There was no evidence of elevated ventricular filling pressure by   Doppler parameters. - Aortic valve: There was no regurgitation. - Aortic root: The aortic root was normal in size. - Mitral valve: Structurally normal valve. There was trivial   regurgitation. - Left atrium: The atrium was normal in size. - Right ventricle: Systolic function was normal. - Right atrium: The atrium was normal in size. - Tricuspid valve: There was trivial regurgitation. - Pulmonic valve: There was no regurgitation. - Pulmonary arteries: Systolic pressure was within the normal   range. - Inferior vena cava: The vessel was normal in size. - Pericardium, extracardiac: There was no pericardial effusion.   Post lap  choly for biliary cholic September 6789   ROS: Denies fever, malais, weight loss, blurry vision, decreased visual acuity, cough, sputum, SOB, hemoptysis, pleuritic pain, palpitaitons, heartburn, abdominal pain, melena, lower extremity edema, claudication, or rash.  All other systems reviewed and negative  General: Affect appropriate Healthy:  appears stated age 42: normal Neck supple with no adenopathy JVP normal no bruits no thyromegaly Lungs clear with no wheezing and good diaphragmatic motion Heart:  S1/S2 no murmur, no rub, gallop or click PMI normal Abdomen: benighn, BS positve, no tenderness, no AAA post hysterectomy and lapcholy no bruit.  No HSM or HJR Distal pulses intact with no bruits No edema Neuro non-focal Skin warm and dry No muscular weakness   Current Outpatient Medications  Medication Sig Dispense Refill  . aspirin EC 81 MG tablet Take 1 tablet (81 mg total) by mouth daily. 90 tablet 3  . Biotin (PA BIOTIN) 1000 MCG tablet Take 1,000 mcg by mouth daily.    . Cyanocobalamin (VITAMIN B-12 PO) Take 1 tablet by mouth daily.     . furosemide (LASIX) 20 MG tablet Take 0.5 tablets (10 mg total) by mouth daily. 45 tablet 1  . Lancets (ONETOUCH ULTRASOFT) lancets Use as instructed once daily.  Dx Code: E11.9 100 each 3  . losartan (COZAAR) 25 MG tablet Take 1 tablet (25 mg total) by mouth daily. 90 tablet 1  . meclizine (ANTIVERT) 25 MG tablet Take 1 tablet (25 mg total) by mouth as needed for dizziness. 30 tablet 5  . metFORMIN (GLUCOPHAGE-XR) 500 MG 24  hr tablet Take 2 tablets (1,000 mg total) by mouth 2 (two) times daily. 360 tablet 1  . nebivolol (BYSTOLIC) 5 MG tablet Take 1 tablet (5 mg total) by mouth daily. 90 tablet 1  . omeprazole (PRILOSEC) 40 MG capsule Take 1 capsule (40 mg total) by mouth daily. 30 capsule 3  . ONE TOUCH ULTRA TEST test strip CHECK BLOOD SUGAR DAILY E11.9 100 each 3  . rosuvastatin (CRESTOR) 20 MG tablet Take 1 tablet (20 mg total) by  mouth daily. 90 tablet 1  . VITAMIN D, CHOLECALCIFEROL, PO Take 1 tablet by mouth daily.      No current facility-administered medications for this visit.     Allergies  Mucinex [guaifenesin er]; Codeine; Advicor [niacin-lovastatin er]; Amlodipine; Lisinopril-hydrochlorothiazide; Other; Ramipril; and Sulfonamide derivatives  Electrocardiogram:  11/22/14  SR rate 72  Low voltage no change from 2014  11/26/15  SR rate 72  Normal low voltage 10/10/16 SR rate 64 normal low voltage 10/26/17  SR rate 66 low voltge nonspecific ST changes   Assessment and Plan SVA: echo 10/10/16  benign no AR consider f/u imaging in 2 years  DM: Discussed low carb diet.  Target hemoglobin A1c is 6.5 or less.  Continue current medications. Obesity: Exercise and low carb diet discussed Chol:  Lab Results  Component Value Date   LDLCALC 46 06/22/2017   HTN:  Well controlled.  Continue current medications and low sodium Dash type diet.   Edema: stable can consider adding diuretic in future if needed  GB: post lap choly with improved symptoms    Jenkins Rouge

## 2017-10-21 NOTE — Telephone Encounter (Signed)
sent 

## 2017-10-26 ENCOUNTER — Ambulatory Visit (INDEPENDENT_AMBULATORY_CARE_PROVIDER_SITE_OTHER): Payer: Medicare Other | Admitting: Cardiovascular Disease

## 2017-10-26 ENCOUNTER — Encounter: Payer: Self-pay | Admitting: Cardiovascular Disease

## 2017-10-26 VITALS — HR 66 | Ht 62.0 in | Wt 143.6 lb

## 2017-10-26 DIAGNOSIS — E785 Hyperlipidemia, unspecified: Secondary | ICD-10-CM

## 2017-10-26 DIAGNOSIS — E1151 Type 2 diabetes mellitus with diabetic peripheral angiopathy without gangrene: Secondary | ICD-10-CM | POA: Diagnosis not present

## 2017-10-26 DIAGNOSIS — E1165 Type 2 diabetes mellitus with hyperglycemia: Secondary | ICD-10-CM

## 2017-10-26 DIAGNOSIS — I10 Essential (primary) hypertension: Secondary | ICD-10-CM | POA: Diagnosis not present

## 2017-10-26 DIAGNOSIS — R1010 Upper abdominal pain, unspecified: Secondary | ICD-10-CM | POA: Diagnosis not present

## 2017-10-26 DIAGNOSIS — Q2549 Other congenital malformations of aorta: Secondary | ICD-10-CM | POA: Diagnosis not present

## 2017-10-26 DIAGNOSIS — IMO0002 Reserved for concepts with insufficient information to code with codable children: Secondary | ICD-10-CM

## 2017-10-26 MED ORDER — OMEPRAZOLE 40 MG PO CPDR: 40.0000 mg | DELAYED_RELEASE_CAPSULE | Freq: Every day | ORAL | 3 refills | Status: DC

## 2017-10-26 MED ORDER — FUROSEMIDE 20 MG PO TABS: 10.0000 mg | ORAL_TABLET | Freq: Every day | ORAL | 3 refills | Status: DC

## 2017-10-26 MED ORDER — ROSUVASTATIN CALCIUM 20 MG PO TABS: 20.0000 mg | ORAL_TABLET | Freq: Every day | ORAL | 3 refills | Status: DC

## 2017-10-26 MED ORDER — LOSARTAN POTASSIUM 25 MG PO TABS: 25.0000 mg | ORAL_TABLET | Freq: Every day | ORAL | 3 refills | Status: DC

## 2017-10-26 MED ORDER — NEBIVOLOL HCL 5 MG PO TABS: 5.0000 mg | ORAL_TABLET | Freq: Every day | ORAL | 3 refills | Status: DC

## 2017-10-26 MED ORDER — MECLIZINE HCL 25 MG PO TABS: 25.0000 mg | ORAL_TABLET | ORAL | 5 refills | Status: DC | PRN

## 2017-10-26 NOTE — Patient Instructions (Addendum)

## 2017-10-27 ENCOUNTER — Telehealth: Payer: Self-pay | Admitting: *Deleted

## 2017-10-27 MED ORDER — MECLIZINE HCL 25 MG PO TABS: 25.0000 mg | ORAL_TABLET | Freq: Three times a day (TID) | ORAL | 3 refills | Status: DC | PRN

## 2017-10-27 NOTE — Telephone Encounter (Signed)
Tid PRN for dizziness I shouldn't be prescribing this

## 2017-10-27 NOTE — Telephone Encounter (Signed)
Will forward question to Dr. Johnsie Cancel.

## 2017-10-27 NOTE — Telephone Encounter (Signed)
Sent to patient's pharmacy.

## 2017-10-27 NOTE — Telephone Encounter (Signed)
Pharmacist from [facility] called and stated that they need a frequency for the meclizine rx that was sent in before they can refill it. He requested that a new rx be sent in via e-scribe. Please advise. Thanks, MI

## 2017-11-03 ENCOUNTER — Ambulatory Visit (INDEPENDENT_AMBULATORY_CARE_PROVIDER_SITE_OTHER): Payer: Medicare Other | Admitting: Internal Medicine

## 2017-11-03 ENCOUNTER — Encounter: Payer: Self-pay | Admitting: Internal Medicine

## 2017-11-03 VITALS — BP 126/70 | HR 80 | Temp 98.4°F | Resp 14 | Ht 62.0 in | Wt 142.5 lb

## 2017-11-03 DIAGNOSIS — J029 Acute pharyngitis, unspecified: Secondary | ICD-10-CM

## 2017-11-03 LAB — POCT RAPID STREP A (OFFICE): Rapid Strep A Screen: NEGATIVE

## 2017-11-03 MED ORDER — AMOXICILLIN 875 MG PO TABS: 875.0000 mg | ORAL_TABLET | Freq: Two times a day (BID) | ORAL | 0 refills | Status: DC

## 2017-11-03 NOTE — Patient Instructions (Addendum)
Rest, fluids , tylenol  If cough:  Take nyquil as needed   If  nasal congestion: Use OTC Nasocort or Flonase : 2 nasal sprays on each side of the nose in the morning until you feel better   Avoid decongestants such as  Pseudoephedrine or phenylephrine     Call if not gradually better over the next  10 days  Call anytime if the symptoms are severe

## 2017-11-03 NOTE — Progress Notes (Signed)
Pre visit review using our clinic review tool, if applicable. No additional management support is needed unless otherwise documented below in the visit note. 

## 2017-11-03 NOTE — Progress Notes (Signed)
Subjective:    [name], female    DOB: 1946-04-25, 71 y.o.   MRN: [id number]  DOS:  11/03/2017 Type of visit - description : acute Interval history: Symptoms started 3 days ago with cold and particularly sore throat.  Pain is bilateral and sometimes intense. Husband is ill with an upper respiratory infection.   Review of Systems No fever chills Minimal sinus congestion No nausea vomiting Mild achiness with the onset of symptoms, that is gone. Mild cough with yellowish sputum.  Past Medical History:  Diagnosis Date  . Diabetes mellitus without complication ([place])   . 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 HYPERTENSION   . MITRAL VALVE PROLAPSE   . OSTEOPENIA   . Overweight(278.02)   . PHARYNGITIS, ACUTE     Past Surgical History:  Procedure Laterality Date  . ABDOMINAL HYSTERECTOMY    . CHOLECYSTECTOMY      Social History   Socioeconomic History  . Marital status: Married    Spouse name: Not on file  . Number of children: Not on file  . Years of education: Not on file  . Highest education level: Not on file  Social Needs  . Financial resource strain: Not on file  . Food insecurity - worry: Not on file  . Food insecurity - inability: Not on file  . Transportation needs - medical: Not on file  . Transportation needs - non-medical: Not on file  Occupational History  . Not on file  Tobacco Use  . Smoking status: Never Smoker  . Smokeless tobacco: Never Used  Substance and Sexual Activity  . Alcohol use: No    Alcohol/week: 0.0 oz  . Drug use: No  . Sexual activity: Not on file  Other Topics Concern  . Not on file  Social History Narrative  . Not on file      Allergies as of 11/03/2017      Reactions   Mucinex [guaifenesin Er] Anaphylaxis   Codeine Nausea And Vomiting   Advicor [niacin-lovastatin Er] Rash   Amlodipine Rash   Lisinopril-hydrochlorothiazide Rash   Other Rash   Eye drops with preservatives.     Ramipril Rash   Sulfonamide Derivatives Rash      Medication List        Accurate as of 11/03/17 11:59 PM. Always use your most recent med list.          amoxicillin 875 MG tablet Commonly known as:  AMOXIL Take 1 tablet (875 mg total) 2 (two) times daily by mouth.   aspirin EC 81 MG tablet Take 1 tablet (81 mg total) by mouth daily.   furosemide 20 MG tablet Commonly known as:  LASIX Take 0.5 tablets (10 mg total) daily by mouth.   losartan 25 MG tablet Commonly known as:  COZAAR Take 1 tablet (25 mg total) daily by mouth.   meclizine 25 MG tablet Commonly known as:  ANTIVERT Take 1 tablet (25 mg total) 3 (three) times daily as needed by mouth for dizziness.   metFORMIN 500 MG 24 hr tablet Commonly known as:  GLUCOPHAGE-XR Take 2 tablets (1,000 mg total) by mouth 2 (two) times daily.   nebivolol 5 MG tablet Commonly known as:  BYSTOLIC Take 1 tablet (5 mg total) daily by mouth.   omeprazole 40 MG capsule Commonly known as:  PRILOSEC Take 1 capsule (40 mg total) daily by mouth.   ONE TOUCH ULTRA TEST test strip Generic drug:  glucose blood CHECK BLOOD SUGAR DAILY E11.9   onetouch ultrasoft lancets Use as instructed once daily.  Dx Code: E11.9   PA BIOTIN 1000 MCG tablet Generic drug:  Biotin Take 1,000 mcg by mouth daily.   rosuvastatin 20 MG tablet Commonly known as:  CRESTOR Take 1 tablet (20 mg total) daily by mouth.   VITAMIN B-12 PO Take 1 tablet by mouth daily.   VITAMIN D (CHOLECALCIFEROL) PO Take 1 tablet by mouth daily.          Objective:   Physical Exam BP 126/70 (BP Location: Left Arm, Patient Position: Sitting, Cuff Size: Small)   Pulse 80   Temp 98.4 F (36.9 C) (Oral)   Resp 14   Ht 5\' 2"  (1.575 m)   Wt 142 lb 8 oz (64.6 kg)   SpO2 97%   BMI 26.06 kg/m  General:   Well developed, well nourished . NAD.  HEENT:  Normocephalic . Face symmetric, atraumatic. Ears: Right obscured by wax, left TM normal. Throat:   uvula midline, no swelling,  redness.  Pharyngeal walls normal and symmetric.  She has a very small white patch at the left tonsil. Sinuses: No TTP Lungs:  CTA B Normal respiratory effort, no intercostal retractions, no accessory muscle use. Heart: RRR,  no murmur.  No pretibial edema bilaterally  Skin: Not pale. Not jaundice Neurologic:  alert & oriented X3.  Speech normal, gait appropriate for age and unassisted Psych--  Cognition and judgment appear intact.  Cooperative with normal attention span and concentration.  Behavior appropriate. No anxious or depressed appearing.      Assessment & Plan:   71 year old lady with history of HTN, cholesterol, osteopenia, DM, presents with: Sore throat: Rapid strep test negative, will send a throat culture but suspect the issue is viral. Recommend Tylenol, fluids, Flonase, NyQuil and call if not better.  Patient is quite concerned about STand requests an antibiotic.  Provided a prescription for amoxicillin to take only if not better in the next 2-3 days.

## 2017-11-05 LAB — CULTURE, GROUP A STREP
MICRO NUMBER: 81277562
SPECIMEN QUALITY:: ADEQUATE

## 2017-11-19 ENCOUNTER — Ambulatory Visit (INDEPENDENT_AMBULATORY_CARE_PROVIDER_SITE_OTHER): Payer: Medicare Other | Admitting: Family Medicine

## 2017-11-19 ENCOUNTER — Encounter: Payer: Self-pay | Admitting: Family Medicine

## 2017-11-19 VITALS — BP 136/79 | HR 74 | Temp 97.5°F | Ht 62.0 in | Wt 141.0 lb

## 2017-11-19 DIAGNOSIS — I10 Essential (primary) hypertension: Secondary | ICD-10-CM | POA: Diagnosis not present

## 2017-11-19 DIAGNOSIS — Z0000 Encounter for general adult medical examination without abnormal findings: Secondary | ICD-10-CM

## 2017-11-19 DIAGNOSIS — E1151 Type 2 diabetes mellitus with diabetic peripheral angiopathy without gangrene: Secondary | ICD-10-CM

## 2017-11-19 DIAGNOSIS — E785 Hyperlipidemia, unspecified: Secondary | ICD-10-CM

## 2017-11-19 DIAGNOSIS — E119 Type 2 diabetes mellitus without complications: Secondary | ICD-10-CM | POA: Diagnosis not present

## 2017-11-19 LAB — LIPID PANEL
CHOL/HDL RATIO: 3
Cholesterol: 125 mg/dL (ref 0–200)
HDL: 40.7 mg/dL (ref >39.00)
LDL CALC: 57 mg/dL (ref 0–99)
NONHDL: 83.94
TRIGLYCERIDES: 136 mg/dL (ref 0.0–149.0)
VLDL: 27.2 mg/dL (ref 0.0–40.0)

## 2017-11-19 LAB — COMPREHENSIVE METABOLIC PANEL
ALBUMIN: 4.4 g/dL (ref 3.5–5.2)
ALT: 16 U/L (ref 0–35)
AST: 19 U/L (ref 0–37)
Alkaline Phosphatase: 43 U/L (ref 39–117)
BILIRUBIN TOTAL: 0.5 mg/dL (ref 0.2–1.2)
BUN: 14 mg/dL (ref 6–23)
CALCIUM: 9.7 mg/dL (ref 8.4–10.5)
CHLORIDE: 104 meq/L (ref 96–112)
CO2: 29 mEq/L (ref 19–32)
CREATININE: 0.78 mg/dL (ref 0.40–1.20)
GFR: 77.62 mL/min (ref >60.00)
Glucose, Bld: 138 mg/dL — ABNORMAL HIGH (ref 70–99)
Potassium: 4.5 mEq/L (ref 3.5–5.1)
Sodium: 140 mEq/L (ref 135–145)
Total Protein: 7.2 g/dL (ref 6.0–8.3)

## 2017-11-19 LAB — MICROALBUMIN / CREATININE URINE RATIO
CREATININE, U: 140.7 mg/dL
Microalb Creat Ratio: 0.7 mg/g (ref 0.0–30.0)
Microalb, Ur: 0.9 mg/dL (ref 0.0–1.9)

## 2017-11-19 LAB — HEMOGLOBIN A1C: Hgb A1c MFr Bld: 7.3 % — ABNORMAL HIGH (ref 4.6–6.5)

## 2017-11-19 NOTE — Assessment & Plan Note (Signed)
Tolerating statin, encouraged heart healthy diet, avoid trans fats, minimize simple carbs and saturated fats. Increase exercise as tolerated 

## 2017-11-19 NOTE — Progress Notes (Signed)
Subjective:  I acted as a Education administrator for Dr. Brunilda Payor, [name]   [name], female    DOB: 27-May-1946, 71 y.o.   MRN: [id number]  Chief Complaint  Patient presents with  . Annual Exam    Pt states she's here for her physical. Orignally scheduled 2 months ago and was cancelled by office.     HPI  Patient is in today for f/u and needs AWV   No complaints  HPI HYPERTENSION   Blood pressure range-not checking   Chest pain- no      Dyspnea- no Lightheadedness- no   Edema- no  Other side effects - no   Medication compliance: good Low salt diet- yes    DIABETES    Blood Sugar ranges-good per pt   Polyuria- no New Visual problems- no  Hypoglycemic symptoms- no  Other side effects-no Medication compliance - good Last eye exam- annually Foot exam- today   HYPERLIPIDEMIA  Medication compliance- good RUQ pain- no  Muscle aches- no Other side effects-no   ROS See HPI above   PMH Smoking Status noted      Patient Care Team: Ann Held, DO as PCP - General Josue Hector, MD as Consulting Physician (Cardiology) Shon Hough, MD as Consulting Physician (Ophthalmology) Lafayette Dragon, MD (Inactive) (Gastroenterology) Mcarthur Rossetti, MD as Consulting Physician (Orthopedic Surgery)   Past Medical History:  Diagnosis Date  . Diabetes mellitus without complication ([place])   . 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 HYPERTENSION   . MITRAL VALVE PROLAPSE   . OSTEOPENIA   . Overweight(278.02)   . PHARYNGITIS, ACUTE     Past Surgical History:  Procedure Laterality Date  . ABDOMINAL HYSTERECTOMY    . CHOLECYSTECTOMY      Family History  Problem Relation Age of Onset  . Emphysema Father   . Diabetes Father   . Heart disease Mother        Had a stent  . Hypertension Unknown   . Colon cancer Neg Hx   . BRCA 1/2 Neg Hx   . Breast cancer Neg Hx     Social History   Socioeconomic History  . Marital status: Married      Spouse name: Not on file  . Number of children: Not on file  . Years of education: Not on file  . Highest education level: Not on file  Social Needs  . Financial resource strain: Not on file  . Food insecurity - worry: Not on file  . Food insecurity - inability: Not on file  . Transportation needs - medical: Not on file  . Transportation needs - non-medical: Not on file  Occupational History  . Not on file  Tobacco Use  . Smoking status: Never Smoker  . Smokeless tobacco: Never Used  Substance and Sexual Activity  . Alcohol use: No    Alcohol/week: 0.0 oz  . Drug use: No  . Sexual activity: Not on file  Other Topics Concern  . Not on file  Social History Narrative  . Not on file    Outpatient Medications Prior to Visit  Medication Sig Dispense Refill  . amoxicillin (AMOXIL) 875 MG tablet Take 1 tablet (875 mg total) 2 (two) times daily by mouth. 14 tablet 0  . aspirin EC 81 MG tablet Take 1 tablet (81 mg total) by mouth daily. 90 tablet 3  . Biotin (PA BIOTIN) 1000 MCG tablet Take  1,000 mcg by mouth daily.    . Cyanocobalamin (VITAMIN B-12 PO) Take 1 tablet by mouth daily.     . furosemide (LASIX) 20 MG tablet Take 0.5 tablets (10 mg total) daily by mouth. 45 tablet 3  . Lancets (ONETOUCH ULTRASOFT) lancets Use as instructed once daily.  Dx Code: E11.9 100 each 3  . losartan (COZAAR) 25 MG tablet Take 1 tablet (25 mg total) daily by mouth. 90 tablet 3  . meclizine (ANTIVERT) 25 MG tablet Take 1 tablet (25 mg total) 3 (three) times daily as needed by mouth for dizziness. 90 tablet 3  . metFORMIN (GLUCOPHAGE-XR) 500 MG 24 hr tablet Take 2 tablets (1,000 mg total) by mouth 2 (two) times daily. 360 tablet 1  . nebivolol (BYSTOLIC) 5 MG tablet Take 1 tablet (5 mg total) daily by mouth. 90 tablet 3  . omeprazole (PRILOSEC) 40 MG capsule Take 1 capsule (40 mg total) daily by mouth. 90 capsule 3  . ONE TOUCH ULTRA TEST test strip CHECK BLOOD SUGAR DAILY E11.9 100 each 3  .  rosuvastatin (CRESTOR) 20 MG tablet Take 1 tablet (20 mg total) daily by mouth. 90 tablet 3  . VITAMIN D, CHOLECALCIFEROL, PO Take 1 tablet by mouth daily.      No facility-administered medications prior to visit.     Allergies  Allergen Reactions  . Mucinex [Guaifenesin Er] Anaphylaxis  . Codeine Nausea And Vomiting  . Advicor [Niacin-Lovastatin Er] Rash  . Amlodipine Rash  . Lisinopril-Hydrochlorothiazide Rash  . Other Rash    Eye drops with preservatives.    . Ramipril Rash  . Sulfonamide Derivatives Rash    ROS     Objective:    Physical Exam  Constitutional: She is oriented to person, place, and time. She appears well-developed and well-nourished.  HENT:  Head: Normocephalic and atraumatic.  Eyes: Conjunctivae and EOM are normal.  Neck: Normal range of motion. Neck supple. No JVD present. Carotid bruit is not present. No thyromegaly present.  Cardiovascular: Normal rate, regular rhythm and normal heart sounds.  No murmur heard. Pulmonary/Chest: Effort normal and breath sounds normal. No respiratory distress. She has no wheezes. She has no rales. She exhibits no tenderness.  Musculoskeletal: She exhibits no edema.  Neurological: She is alert and oriented to person, place, and time.  Psychiatric: She has a normal mood and affect.  Nursing note and vitals reviewed. Sensory exam of the foot is normal, tested with the monofilament. Good pulses, no lesions or ulcers, good peripheral pulses.   BP 136/79   Pulse 74   Temp (!) 97.5 F (36.4 C) (Oral)   Ht _0  (1.575 m)   Wt 141 lb (64 kg)   SpO2 95%   BMI 25.79 kg/m  Wt Readings from Last 3 Encounters:  11/19/17 141 lb (64 kg)  11/03/17 142 lb 8 oz (64.6 kg)  10/26/17 143 lb 9.6 oz (65.1 kg)   BP Readings from Last 3 Encounters:  11/19/17 136/79  11/03/17 126/70  03/23/17 116/66     Immunization History  Administered Date(s) Administered  . Influenza Whole 10/20/2007  . Influenza, High Dose Seasonal PF  09/01/2016  . Influenza,inj,Quad PF,6+ Mos 09/05/2013, 09/05/2014, 09/13/2015  . Influenza-Unspecified 09/21/2017  . Pneumococcal Conjugate-13 02/27/2014  . Pneumococcal Polysaccharide-23 09/06/2011  . Zoster 09/14/2013  . Zoster Recombinat (Shingrix) 06/13/2017    Health Maintenance  Topic Date Due  . PNA vac Low Risk Adult (2 of 2 - PPSV23) 09/05/2016  . HEMOGLOBIN A1C  12/23/2017  . FOOT EXAM  03/23/2018  . OPHTHALMOLOGY EXAM  05/11/2018  . MAMMOGRAM  10/13/2019  . COLONOSCOPY  11/09/2023  . TETANUS/TDAP  09/13/2024  . INFLUENZA VACCINE  Completed  . DEXA SCAN  Completed  . Hepatitis C Screening  Completed    Lab Results  Component Value Date   WBC 6.0 09/25/2016   HGB 13.0 09/25/2016   HCT 38.8 09/25/2016   PLT 257.0 09/25/2016   GLUCOSE 120 (H) 06/22/2017   CHOL 110 06/22/2017   TRIG 106.0 06/22/2017   HDL 43.60 06/22/2017   LDLDIRECT 78.3 07/12/2013   LDLCALC 46 06/22/2017   ALT 16 06/22/2017   AST 19 06/22/2017   NA 142 06/22/2017   K 3.7 06/22/2017   CL 105 06/22/2017   CREATININE 0.79 06/22/2017   BUN 12 06/22/2017   CO2 30 06/22/2017   HGBA1C 6.9 (H) 06/22/2017   MICROALBUR 1.9 09/13/2015    No results found for: TSH Lab Results  Component Value Date   WBC 6.0 09/25/2016   HGB 13.0 09/25/2016   HCT 38.8 09/25/2016   MCV 88.1 09/25/2016   PLT 257.0 09/25/2016   Lab Results  Component Value Date   NA 142 06/22/2017   K 3.7 06/22/2017   CO2 30 06/22/2017   GLUCOSE 120 (H) 06/22/2017   BUN 12 06/22/2017   CREATININE 0.79 06/22/2017   BILITOT 0.4 06/22/2017   ALKPHOS 36 (L) 06/22/2017   AST 19 06/22/2017   ALT 16 06/22/2017   PROT 6.8 06/22/2017   ALBUMIN 4.2 06/22/2017   CALCIUM 9.1 06/22/2017   GFR 76.58 06/22/2017   Lab Results  Component Value Date   CHOL 110 06/22/2017   Lab Results  Component Value Date   HDL 43.60 06/22/2017   Lab Results  Component Value Date   LDLCALC 46 06/22/2017   Lab Results  Component Value Date    TRIG 106.0 06/22/2017   Lab Results  Component Value Date   CHOLHDL 3 06/22/2017   Lab Results  Component Value Date   HGBA1C 6.9 (H) 06/22/2017         Assessment & Plan:   Problem List Items Addressed This Visit      Unprioritized   Essential hypertension - Primary    Well controlled, no changes to meds. Encouraged heart healthy diet such as the DASH diet and exercise as tolerated.       Relevant Orders   Microalbumin / creatinine urine ratio   Comprehensive metabolic panel   Hyperlipidemia LDL goal <70    Tolerating statin, encouraged heart healthy diet, avoid trans fats, minimize simple carbs and saturated fats. Increase exercise as tolerated      Relevant Orders   Lipid panel   Type 2 diabetes mellitus not at goal [facility])    hgba1c to be checked, minimize simple carbs. Increase exercise as tolerated. Continue current meds        Other Visit Diagnoses    DM (diabetes mellitus) type II, controlled, with peripheral vascular disorder ([place])       Relevant Orders   Microalbumin / creatinine urine ratio   Lipid panel   Hemoglobin A1c   Comprehensive metabolic panel   Preventative health care       Relevant Orders   Ambulatory referral to Dermatology      I am having Cynthia Burns maintain her aspirin EC, Cyanocobalamin (VITAMIN B-12 PO), (VITAMIN D, CHOLECALCIFEROL, PO), Biotin, onetouch ultrasoft, ONE TOUCH ULTRA TEST, metFORMIN, furosemide, losartan,  nebivolol, omeprazole, rosuvastatin, meclizine, and amoxicillin.  No orders of the defined types were placed in this encounter.   CMA served as Education administrator during this visit. History, Physical and Plan performed by medical provider. Documentation and orders reviewed and attested to.  Ann Held, DO

## 2017-11-19 NOTE — Patient Instructions (Signed)

## 2017-11-19 NOTE — Assessment & Plan Note (Signed)
Well controlled, no changes to meds. Encouraged heart healthy diet such as the DASH diet and exercise as tolerated.  °

## 2017-11-19 NOTE — Assessment & Plan Note (Signed)
hgba1c to be checked, minimize simple carbs. Increase exercise as tolerated. Continue current meds  

## 2017-11-20 ENCOUNTER — Other Ambulatory Visit: Payer: Self-pay

## 2017-11-20 DIAGNOSIS — E119 Type 2 diabetes mellitus without complications: Secondary | ICD-10-CM

## 2017-11-20 MED ORDER — SITAGLIPTIN PHOSPHATE 100 MG PO TABS: 100.0000 mg | ORAL_TABLET | Freq: Every day | ORAL | 2 refills | Status: DC

## 2017-11-20 MED ORDER — SITAGLIPTIN PHOSPHATE 100 MG PO TABS: 100.0000 mg | ORAL_TABLET | Freq: Every day | ORAL | 1 refills | Status: DC

## 2017-12-02 ENCOUNTER — Telehealth: Payer: Self-pay | Admitting: Family Medicine

## 2017-12-02 NOTE — Telephone Encounter (Signed)
Copied from [place]. Topic: Quick Communication - See Telephone Encounter >> Dec 02, 2017  2:20 PM Antonieta Iba C wrote:    CRM for notification. See Telephone encounter for: Pt called in because she started Januvia. Pt says that she is reading the side effects. One side effect is weight gain, pt says that she does not want to gain weight. Another side effect is that it causes gallstones, pt says that she had her gallbladder removed 10/2016 also pt says that it causes ankle/leg swelling. Pt says that she has always had concerns with swelling so she stay away from salts, pt said that she do not want to continue taking medication. She would like a call back to discuss further.   12/02/17.

## 2017-12-07 NOTE — Telephone Encounter (Signed)
Unfortunately there possible side effects to everything --  If she doesn't want that one we can try farxiga 5 mg or we can refer to endo

## 2017-12-08 NOTE — Telephone Encounter (Signed)
Advised patient.  She stated that she will continue the Januvia.

## 2018-01-13 ENCOUNTER — Telehealth: Payer: Self-pay | Admitting: Family Medicine

## 2018-01-13 DIAGNOSIS — E1165 Type 2 diabetes mellitus with hyperglycemia: Principal | ICD-10-CM

## 2018-01-13 DIAGNOSIS — IMO0002 Reserved for concepts with insufficient information to code with codable children: Secondary | ICD-10-CM

## 2018-01-13 DIAGNOSIS — E119 Type 2 diabetes mellitus without complications: Secondary | ICD-10-CM

## 2018-01-13 DIAGNOSIS — E1151 Type 2 diabetes mellitus with diabetic peripheral angiopathy without gangrene: Secondary | ICD-10-CM

## 2018-01-13 NOTE — Telephone Encounter (Signed)
Copied from [place]. Topic: Quick Communication - Rx Refill/Question >> Jan 13, 2018 12:06 PM Oliver Pila B wrote: Medication: Lancets Glory Rosebush ULTRASOFT) lancets [[id number]] , rosuvastatin (CRESTOR) 20 MG tablet [[id number]] , metFORMIN (GLUCOPHAGE-XR) 500 MG 24 hr tablet [[id number]]    Has the patient contacted their pharmacy? Yes.     (Agent: If no, request that the patient contact the pharmacy for the refill.)   Preferred Pharmacy (with phone number or street name): [facility]   Agent: Please be advised that RX refills may take up to 3 business days. We ask that you follow-up with your pharmacy.

## 2018-01-14 MED ORDER — ONETOUCH ULTRA BLUE VI STRP: ORAL_STRIP | 1 refills | Status: DC

## 2018-01-14 MED ORDER — ONETOUCH ULTRASOFT LANCETS MISC: 1 refills | Status: DC

## 2018-01-14 MED ORDER — METFORMIN HCL ER 500 MG PO TB24: 1000.0000 mg | ORAL_TABLET | Freq: Two times a day (BID) | ORAL | 1 refills | Status: DC

## 2018-01-14 MED ORDER — SITAGLIPTIN PHOSPHATE 100 MG PO TABS: 100.0000 mg | ORAL_TABLET | Freq: Every day | ORAL | 1 refills | Status: DC

## 2018-01-14 MED ORDER — ROSUVASTATIN CALCIUM 20 MG PO TABS: 20.0000 mg | ORAL_TABLET | Freq: Every day | ORAL | 1 refills | Status: DC

## 2018-01-14 NOTE — Telephone Encounter (Signed)
Patient notified that rxs have been sent in. 

## 2018-02-09 DIAGNOSIS — L918 Other hypertrophic disorders of the skin: Secondary | ICD-10-CM | POA: Diagnosis not present

## 2018-02-09 DIAGNOSIS — L728 Other follicular cysts of the skin and subcutaneous tissue: Secondary | ICD-10-CM | POA: Diagnosis not present

## 2018-02-09 DIAGNOSIS — D1801 Hemangioma of skin and subcutaneous tissue: Secondary | ICD-10-CM | POA: Diagnosis not present

## 2018-02-09 DIAGNOSIS — L821 Other seborrheic keratosis: Secondary | ICD-10-CM | POA: Diagnosis not present

## 2018-02-09 DIAGNOSIS — D225 Melanocytic nevi of trunk: Secondary | ICD-10-CM | POA: Diagnosis not present

## 2018-02-15 ENCOUNTER — Other Ambulatory Visit: Payer: Self-pay | Admitting: Emergency Medicine

## 2018-02-15 ENCOUNTER — Other Ambulatory Visit (INDEPENDENT_AMBULATORY_CARE_PROVIDER_SITE_OTHER): Payer: Medicare Other

## 2018-02-15 DIAGNOSIS — E785 Hyperlipidemia, unspecified: Secondary | ICD-10-CM

## 2018-02-15 DIAGNOSIS — I10 Essential (primary) hypertension: Secondary | ICD-10-CM

## 2018-02-15 DIAGNOSIS — E119 Type 2 diabetes mellitus without complications: Secondary | ICD-10-CM | POA: Diagnosis not present

## 2018-02-15 LAB — HEMOGLOBIN A1C: HEMOGLOBIN A1C: 6.8 % — AB (ref 4.6–6.5)

## 2018-02-15 LAB — LIPID PANEL
CHOLESTEROL: 123 mg/dL (ref 0–200)
HDL: 41.3 mg/dL (ref >39.00)
LDL Cholesterol: 53 mg/dL (ref 0–99)
NONHDL: 81.52
TRIGLYCERIDES: 144 mg/dL (ref 0.0–149.0)
Total CHOL/HDL Ratio: 3
VLDL: 28.8 mg/dL (ref 0.0–40.0)

## 2018-02-15 LAB — COMPREHENSIVE METABOLIC PANEL
ALK PHOS: 41 U/L (ref 39–117)
ALT: 14 U/L (ref 0–35)
AST: 16 U/L (ref 0–37)
Albumin: 4.3 g/dL (ref 3.5–5.2)
BUN: 18 mg/dL (ref 6–23)
CALCIUM: 9.7 mg/dL (ref 8.4–10.5)
CO2: 30 mEq/L (ref 19–32)
Chloride: 103 mEq/L (ref 96–112)
Creatinine, Ser: 0.83 mg/dL (ref 0.40–1.20)
GFR: 72.2 mL/min (ref >60.00)
GLUCOSE: 128 mg/dL — AB (ref 70–99)
POTASSIUM: 3.7 meq/L (ref 3.5–5.1)
Sodium: 141 mEq/L (ref 135–145)
TOTAL PROTEIN: 6.9 g/dL (ref 6.0–8.3)
Total Bilirubin: 0.5 mg/dL (ref 0.2–1.2)

## 2018-03-16 IMAGING — CT CT ABD-PELV W/ CM
2 of 5 series · 16 of 46 positions shown, 18 images · IV contrast (iopamidol)
Comparison: None.

CLINICAL DATA: Epigastric abdominal pain for the past 4 days
following a heavy meal. Clinical concern for pancreatitis.

EXAM:
CT ABDOMEN AND PELVIS WITH CONTRAST
TECHNIQUE: Multidetector CT imaging of the abdomen and pelvis was performed
using the standard protocol following bolus administration of
intravenous contrast.
CONTRAST:  100mL A93TG1-TKK IOPAMIDOL (A93TG1-TKK) INJECTION 61%

[Series 2: axial st · axial · 0.87mm/px · z∈[-551,-161]mm · 13 of 88 slices shown, 15 images]
[im 5/88  soft-tissue]
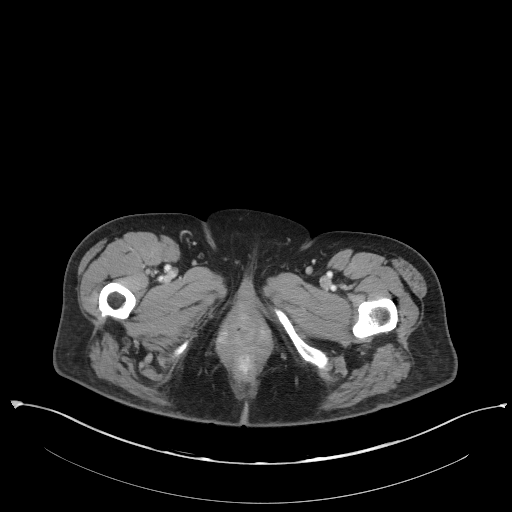
[im 5/88  bone]
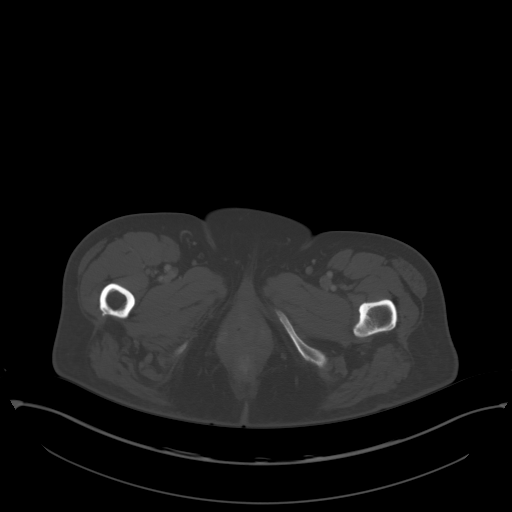
[im 14/88  soft-tissue]
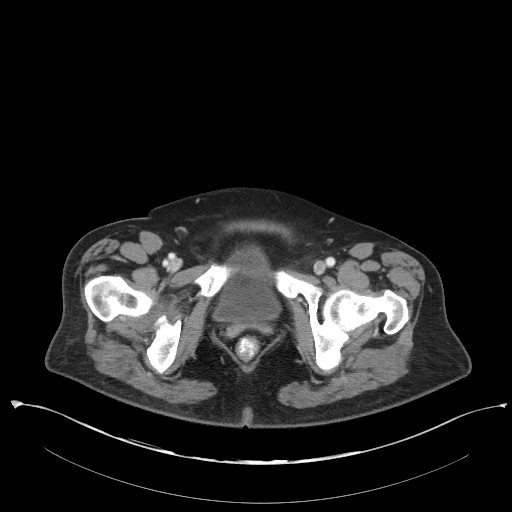
[im 19/88  soft-tissue]
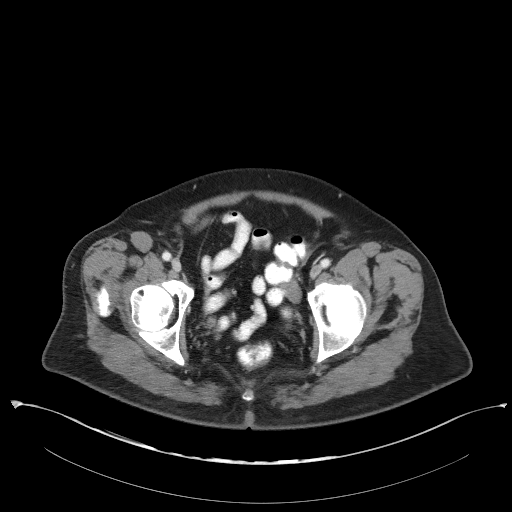
[im 23/88  soft-tissue]
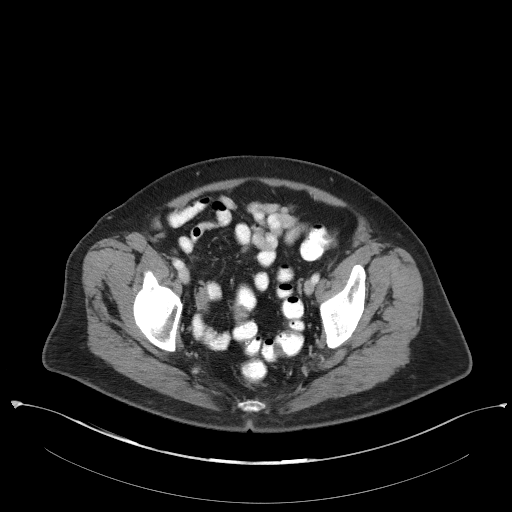
[im 33/88  soft-tissue]
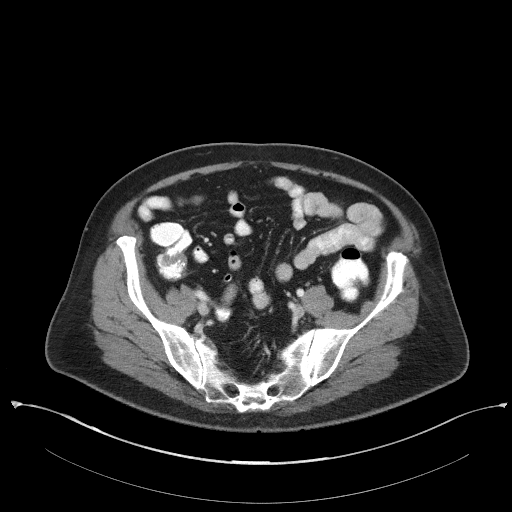
[im 37/88  soft-tissue]
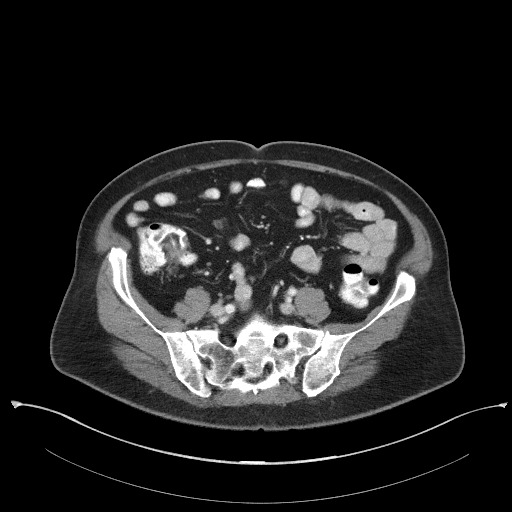
[im 46/88  soft-tissue]
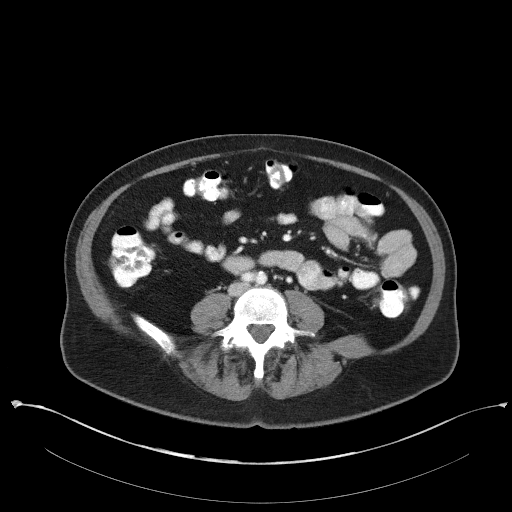
[im 51/88  soft-tissue]
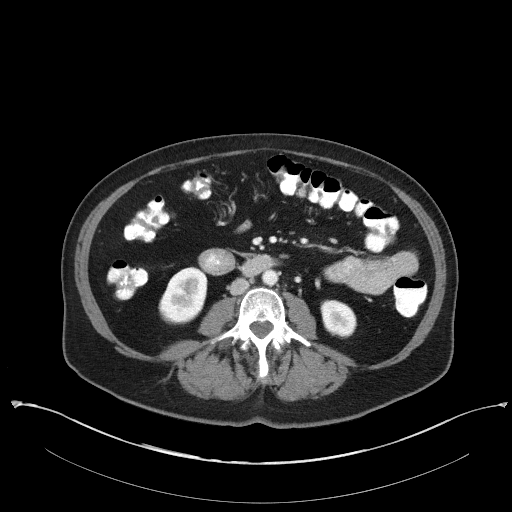
[im 55/88  soft-tissue]
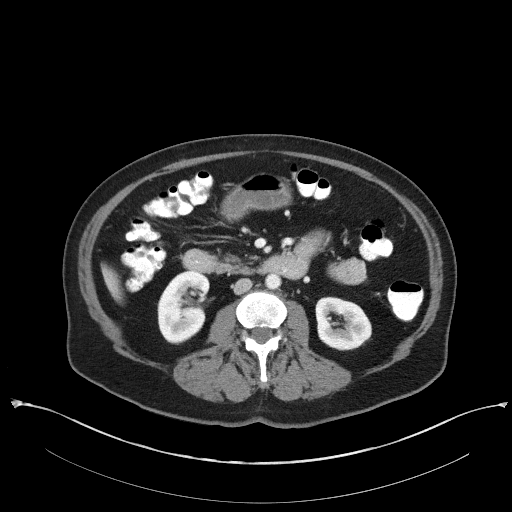
[im 55/88  bone]
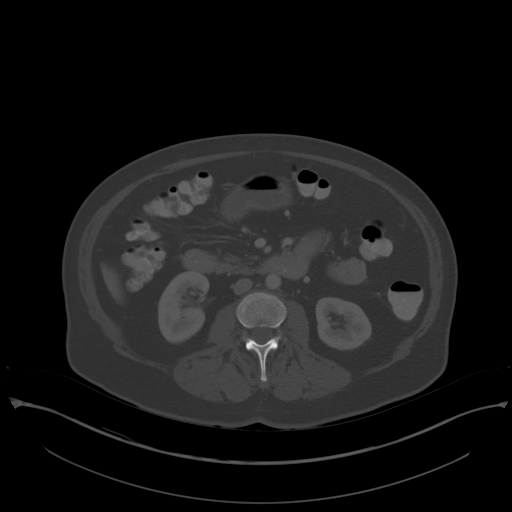
[im 65/88  soft-tissue]
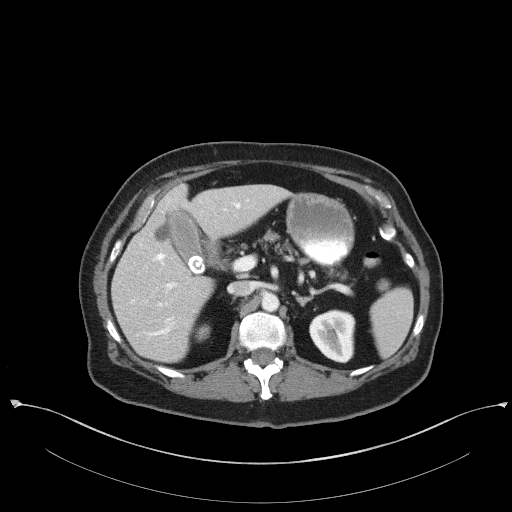
[im 69/88  soft-tissue]
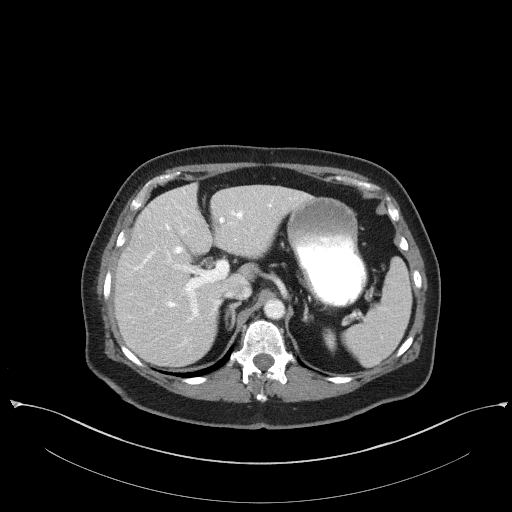
[im 74/88  soft-tissue]
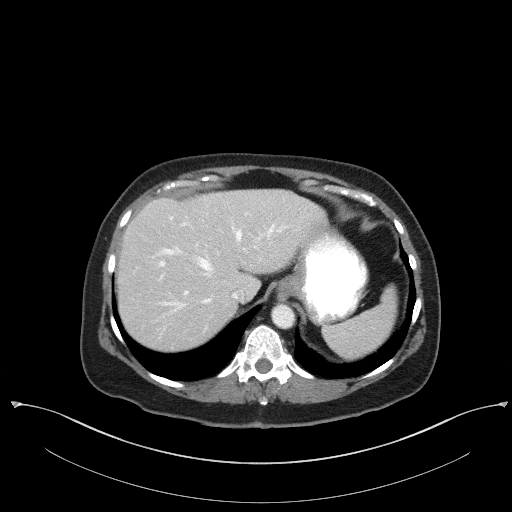
[im 83/88  soft-tissue]
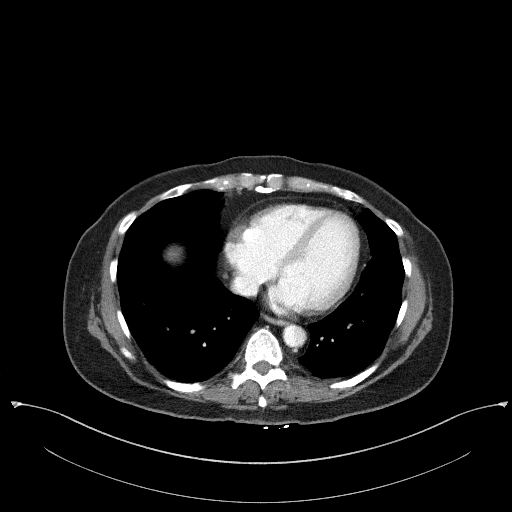

[Series 5: coronal st · coronal · 0.65mm/px · 3 of 85 slices shown]
[im 29/85  soft-tissue]
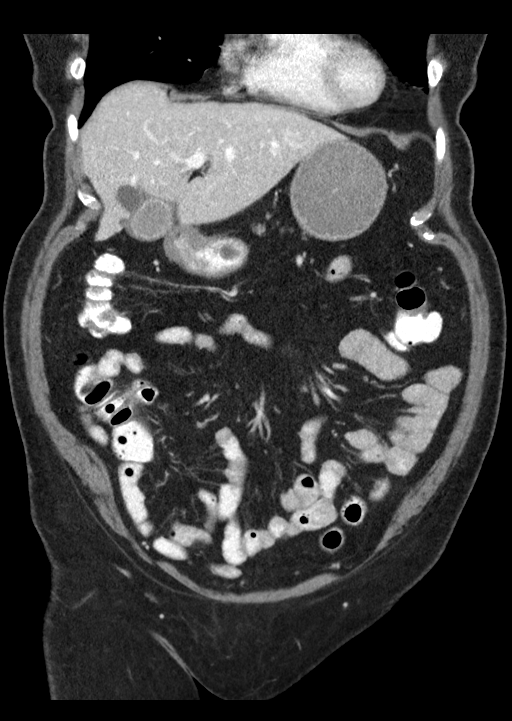
[im 38/85  soft-tissue]
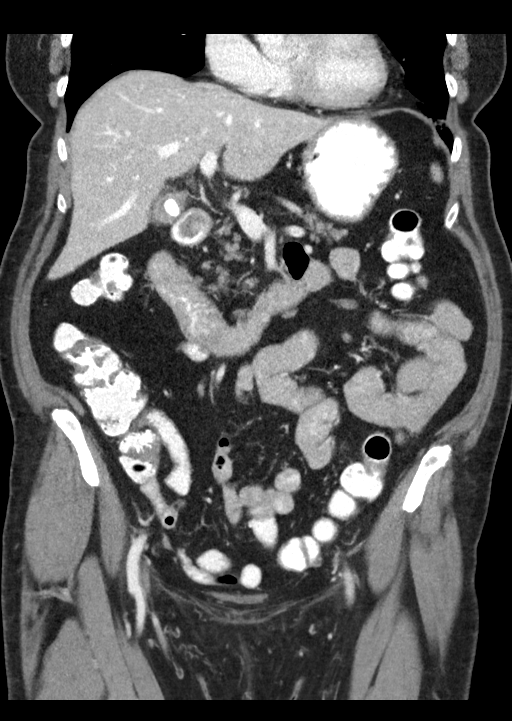
[im 47/85  soft-tissue]
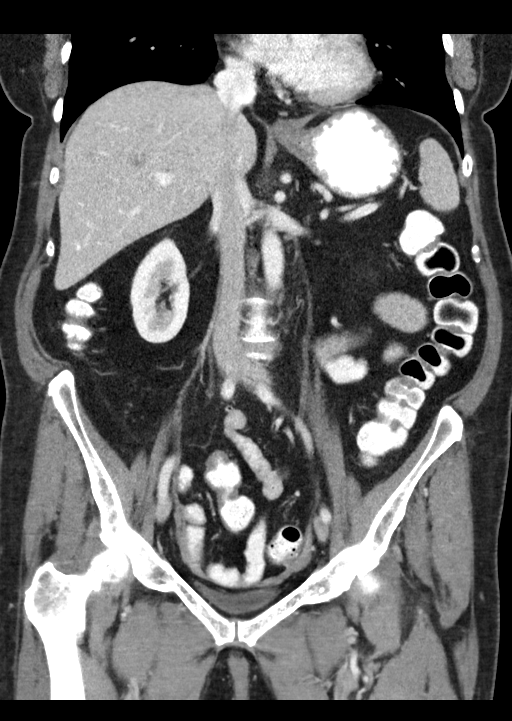

[16 of 46 positions shown; findings below may reference images not displayed]

FINDINGS: Lower chest: Clear lung bases.

Hepatobiliary: Multiple liver cysts. 1.5 cm gallstone in the
gallbladder neck. Mild diffuse gallbladder wall thickening.

Pancreas: Diffuse pancreatic atrophy with partial fatty replacement.
No inflammatory changes.

Spleen: Normal appearing spleen.

Adrenals/Urinary Tract: Adrenal glands are unremarkable. Kidneys are
normal, without renal calculi, focal lesion, or hydronephrosis.
Bladder is unremarkable.

Stomach/Bowel: Stomach is within normal limits. No evidence of bowel
wall thickening, distention, or inflammatory changes. Appendix
appears normal.

Vascular/Lymphatic: Mild atheromatous arterial calcifications,
including the abdominal aorta. No enlarged lymph nodes.

Reproductive: Surgically absent uterus.  Normal appearing ovaries.

Other: Very small umbilical hernia containing fat.

Musculoskeletal: Mild lumbar and lower thoracic spine degenerative
changes. Right femoral head cyst.
IMPRESSION: 1. Cholelithiasis.
2. Mild diffuse gallbladder wall thickening. This could be due to
acute or chronic cholecystitis.
3. Diffuse pancreatic atrophy without evidence of pancreatitis.

## 2018-05-05 ENCOUNTER — Telehealth: Payer: Self-pay | Admitting: Family Medicine

## 2018-05-05 DIAGNOSIS — IMO0002 Reserved for concepts with insufficient information to code with codable children: Secondary | ICD-10-CM

## 2018-05-05 DIAGNOSIS — E1151 Type 2 diabetes mellitus with diabetic peripheral angiopathy without gangrene: Secondary | ICD-10-CM

## 2018-05-05 DIAGNOSIS — E119 Type 2 diabetes mellitus without complications: Secondary | ICD-10-CM

## 2018-05-05 DIAGNOSIS — E1165 Type 2 diabetes mellitus with hyperglycemia: Principal | ICD-10-CM

## 2018-05-05 NOTE — Telephone Encounter (Signed)
Please review for meds for refill. Pharmacy is requesting a social security number for this patient when checking on refills. Pharmacy is [facility], # [phone].  It looks like there should be refills for this patient for : metformin 500 MG 24 hr tab, stiagliptin 100  MG tab, One Touch ultrasoft lancets, and One Touch Test strips. Just need clarification  LOV  11/19/17 NOV  05/25/18 Dr. Carollee Herter

## 2018-05-05 NOTE — Telephone Encounter (Signed)
Copied from [place] [id number]. Topic: Quick Communication - Rx Refill/Question >> May 05, 2018 11:14 AM Pricilla Handler wrote: 4 Medications: MetFORMIN (GLUCOPHAGE-XR) 500 MG 24 hr tablet, SitaGLIPtin (JANUVIA) 100 MG tablet, Lancets (ONETOUCH ULTRASOFT) lancets, and ONE TOUCH ULTRA TEST test strip. Has the patient contacted their pharmacy? Yes.   (Agent: If no, request that the patient contact the pharmacy for the refill.) Preferred Pharmacy (with phone number or street name): [facility] - [place], [place] - [address] (Phone) [phone] (Fax)  Agent: Please be advised that RX refills may take up to 3 business days. We ask that you follow-up with your pharmacy.

## 2018-05-06 MED ORDER — SITAGLIPTIN PHOSPHATE 100 MG PO TABS: 100.0000 mg | ORAL_TABLET | Freq: Every day | ORAL | 1 refills | Status: DC

## 2018-05-06 MED ORDER — ONETOUCH ULTRA BLUE VI STRP: ORAL_STRIP | 1 refills | Status: DC

## 2018-05-06 MED ORDER — ONETOUCH ULTRASOFT LANCETS MISC: 1 refills | Status: DC

## 2018-05-06 MED ORDER — METFORMIN HCL ER 500 MG PO TB24: 1000.0000 mg | ORAL_TABLET | Freq: Two times a day (BID) | ORAL | 1 refills | Status: DC

## 2018-05-06 NOTE — Telephone Encounter (Signed)
Patient notified that prescriptions has been sent in.

## 2018-05-13 DIAGNOSIS — H25813 Combined forms of age-related cataract, bilateral: Secondary | ICD-10-CM | POA: Diagnosis not present

## 2018-05-13 DIAGNOSIS — H5203 Hypermetropia, bilateral: Secondary | ICD-10-CM | POA: Diagnosis not present

## 2018-05-13 DIAGNOSIS — H43812 Vitreous degeneration, left eye: Secondary | ICD-10-CM | POA: Diagnosis not present

## 2018-05-13 DIAGNOSIS — E119 Type 2 diabetes mellitus without complications: Secondary | ICD-10-CM | POA: Diagnosis not present

## 2018-05-13 LAB — HM DIABETES EYE EXAM

## 2018-05-20 NOTE — Progress Notes (Signed)
Subjective:   Cynthia Burns is a 72 y.o. female who presents for Medicare Annual (Subsequent) preventive examination.  Review of Systems: No ROS.  Medicare Wellness Visit. Additional risk factors are reflected in the social history. Cardiac Risk Factors include: advanced age (>31mn, >>39women);diabetes mellitus;dyslipidemia;hypertension Sleep patterns:   Sleep varies. Generally sleeps well. Home Safety/Smoke Alarms: Feels safe in home. Smoke alarms in place.  Living environment; residence and Firearm Safety: Lives in 2 story home with husband.   Eye: Last eye exam 05/20/18. Yearly with Dr.Burner.   Female:   Pap- No longer doing routine screening due to age.       Mammo-   utd    Dexa scan- will do with mammogram in October       CCS-due 2024    Objective:     Vitals: BP 126/70 (BP Location: Left Arm, Patient Position: Sitting, Cuff Size: Normal) Comment: vitals done by S.Richardson at pcp appt this am  Pulse 60   Ht '5\' 2"'  (1.575 m)   Wt 143 lb (64.9 kg)   SpO2 98%   BMI 26.16 kg/m   Body mass index is 26.16 kg/m.  Advanced Directives 05/25/2018 09/25/2016  Does Patient Have a Medical Advance Directive? Yes Yes  Type of [place] will [place] will  Does patient want to make changes to medical advance directive? - No - Patient declined  Copy of [place] Chart? No - copy requested No - copy requested    Tobacco Social History   Tobacco Use  Smoking Status Never Smoker  Smokeless Tobacco Never Used     Counseling given: Not Answered   Clinical Intake:     Pain : No/denies pain                 Past Medical History:  Diagnosis Date  . [place] complication ([place]   . 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 HYPERTENSION   . MITRAL VALVE PROLAPSE   . OSTEOPENIA   . Overweight(278.02)   . PHARYNGITIS, ACUTE    Past Surgical History:    Procedure Laterality Date  . ABDOMINAL HYSTERECTOMY    . CHOLECYSTECTOMY     Family History  Problem Relation Age of Onset  . Emphysema Father   . Diabetes Father   . Heart disease Mother        Had a stent  . Hypertension Unknown   . Colon cancer Neg Hx   . BRCA 1/2 Neg Hx   . Breast cancer Neg Hx    Social History   Socioeconomic History  . Marital status: Married    Spouse name: Not on file  . Number of children: Not on file  . Years of education: Not on file  . Highest education level: Not on file  Occupational History  . Not on file  Social Needs  . Financial resource strain: Not on file  . Food insecurity:    Worry: Not on file    Inability: Not on file  . Transportation needs:    Medical: Not on file    Non-medical: Not on file  Tobacco Use  . Smoking status: Never Smoker  . Smokeless tobacco: Never Used  Substance and Sexual Activity  . Alcohol use: No    Alcohol/week: 0.0 oz  . Drug use: No  . Sexual activity: Not Currently  Lifestyle  . Physical activity:  Days per week: Not on file    Minutes per session: Not on file  . Stress: Not on file  Relationships  . Social connections:    Talks on phone: Not on file    Gets together: Not on file    Attends religious service: Not on file    Active member of club or organization: Not on file    Attends meetings of clubs or organizations: Not on file    Relationship status: Not on file  Other Topics Concern  . Not on file  Social History Narrative  . Not on file    Outpatient Encounter Medications as of 05/25/2018  Medication Sig  . aspirin EC 81 MG tablet Take 1 tablet (81 mg total) by mouth daily.  . Biotin (PA BIOTIN) 1000 MCG tablet Take 1,000 mcg by mouth daily.  . Cyanocobalamin (VITAMIN B-12 PO) Take 1 tablet by mouth daily.   . furosemide (LASIX) 20 MG tablet Take 0.5 tablets (10 mg total) daily by mouth.  Marland Kitchen glucose blood (ONE TOUCH ULTRA TEST) test strip CHECK BLOOD SUGAR DAILY E11.9  .  Lancets (ONETOUCH ULTRASOFT) lancets Use as instructed once daily.  Dx Code: E11.9  . losartan (COZAAR) 25 MG tablet Take 1 tablet (25 mg total) daily by mouth.  . meclizine (ANTIVERT) 25 MG tablet Take 1 tablet (25 mg total) 3 (three) times daily as needed by mouth for dizziness.  . metFORMIN (GLUCOPHAGE-XR) 500 MG 24 hr tablet Take 2 tablets (1,000 mg total) by mouth 2 (two) times daily.  . nebivolol (BYSTOLIC) 5 MG tablet Take 1 tablet (5 mg total) daily by mouth.  . rosuvastatin (CRESTOR) 20 MG tablet Take 1 tablet (20 mg total) by mouth daily.  . sitaGLIPtin (JANUVIA) 100 MG tablet Take 1 tablet (100 mg total) by mouth daily.  Marland Kitchen VITAMIN D, CHOLECALCIFEROL, PO Take 1 tablet by mouth daily.   Marland Kitchen amoxicillin (AMOXIL) 500 MG capsule Take 500 mg by mouth. For dental procedures  . [DISCONTINUED] amoxicillin (AMOXIL) 875 MG tablet Take 1 tablet (875 mg total) 2 (two) times daily by mouth.  . [DISCONTINUED] omeprazole (PRILOSEC) 40 MG capsule Take 1 capsule (40 mg total) daily by mouth.  . [DISCONTINUED] ONE TOUCH ULTRA TEST test strip CHECK BLOOD SUGAR DAILY E11.9   No facility-administered encounter medications on file as of 05/25/2018.     Activities of Daily Living In your present state of health, do you have any difficulty performing the following activities: 05/25/2018 11/19/2017  Hearing? N N  Vision? N N  Difficulty concentrating or making decisions? N N  Walking or climbing stairs? N N  Dressing or bathing? N N  Doing errands, shopping? N N  Preparing Food and eating ? N -  Using the Toilet? N -  In the past six months, have you accidently leaked urine? N -  Do you have problems with loss of bowel control? N -  Managing your Medications? N -  Managing your Finances? N -  Housekeeping or managing your Housekeeping? N -  Some recent data might be hidden    Patient Care Team: Carollee Herter, Alferd Apa, DO as PCP - General Josue Hector, MD as Consulting Physician  (Cardiology) Shon Hough, MD as Consulting Physician (Ophthalmology) Lafayette Dragon, MD (Inactive) (Gastroenterology) Mcarthur Rossetti, MD as Consulting Physician (Orthopedic Surgery)    Assessment:   This is a routine wellness examination for Cynthia Burns. Physical assessment deferred to PCP.  Exercise Activities and Dietary recommendations  Current Exercise Habits: The patient does not participate in regular exercise at present, Exercise limited by: None identified Diet (meal preparation, eat out, water intake, caffeinated beverages, dairy products, fruits and vegetables): well balanced     Goals    . Weight (lb) < 135 lb (61.2 kg)       Fall Risk Fall Risk  05/25/2018 11/19/2017 09/25/2016 09/13/2015 03/15/2015  Falls in the past year? No No No No No  Number falls in past yr: - - - - -  Injury with Fall? - - - - -  Risk for fall due to : - - - - -  Risk for fall due to: Comment - - - - -    Depression Screen PHQ 2/9 Scores 05/25/2018 11/19/2017 09/25/2016 09/13/2015  PHQ - 2 Score 0 0 0 0     Cognitive Function MMSE - Mini Mental State Exam 09/25/2016  Orientation to time 5  Orientation to Place 5  Registration 3  Attention/ Calculation 5  Recall 3  Language- name 2 objects 2  Language- repeat 1  Language- follow 3 step command 3  Language- read & follow direction 1  Write a sentence 1  Copy design 1  Total score 30        Immunization History  Administered Date(s) Administered  . Influenza Whole 10/20/2007  . Influenza, High Dose Seasonal PF 09/01/2016  . Influenza,inj,Quad PF,6+ Mos 09/05/2013, 09/05/2014, 09/13/2015  . Influenza-Unspecified 09/21/2017  . Pneumococcal Conjugate-13 02/27/2014  . Pneumococcal Polysaccharide-23 09/06/2011  . Zoster 09/14/2013  . Zoster Recombinat (Shingrix) 06/13/2017   Screening Tests Health Maintenance  Topic Date Due  . PNA vac Low Risk Adult (2 of 2 - PPSV23) 05/26/2019 (Originally 09/05/2016)  . INFLUENZA VACCINE   07/22/2018  . HEMOGLOBIN A1C  08/15/2018  . MAMMOGRAM  10/12/2018  . OPHTHALMOLOGY EXAM  05/21/2019  . FOOT EXAM  05/26/2019  . COLONOSCOPY  11/09/2023  . TETANUS/TDAP  09/13/2024  . DEXA SCAN  Completed  . Hepatitis C Screening  Completed       Plan:    Please schedule your next medicare wellness visit with me in 1 yr.  Continue to eat heart healthy diet (full of fruits, vegetables, whole grains, lean protein, water--limit salt, fat, and sugar intake) and increase physical activity as tolerated.  Continue doing brain stimulating activities (puzzles, reading, adult coloring books, staying active) to keep memory sharp.    I have personally reviewed and noted the following in the patient's chart:   . Medical and social history . Use of alcohol, tobacco or illicit drugs  . Current medications and supplements . Functional ability and status . Nutritional status . Physical activity . Advanced directives . List of other physicians . Hospitalizations, surgeries, and ER visits in previous 12 months . Vitals . Screenings to include cognitive, depression, and falls . Referrals and appointments  In addition, I have reviewed and discussed with patient certain preventive protocols, quality metrics, and best practice recommendations. A written personalized care plan for preventive services as well as general preventive health recommendations were provided to patient.     Shela Nevin, [place]  05/25/2018

## 2018-05-25 ENCOUNTER — Ambulatory Visit: Payer: Medicare Other

## 2018-05-25 ENCOUNTER — Encounter: Payer: Self-pay | Admitting: Family Medicine

## 2018-05-25 ENCOUNTER — Ambulatory Visit (INDEPENDENT_AMBULATORY_CARE_PROVIDER_SITE_OTHER): Payer: Medicare Other | Admitting: *Deleted

## 2018-05-25 ENCOUNTER — Ambulatory Visit (INDEPENDENT_AMBULATORY_CARE_PROVIDER_SITE_OTHER): Payer: Medicare Other | Admitting: Family Medicine

## 2018-05-25 ENCOUNTER — Encounter: Payer: Self-pay | Admitting: *Deleted

## 2018-05-25 VITALS — BP 126/70 | HR 60 | Temp 97.8°F | Resp 16 | Ht 62.0 in | Wt 143.8 lb

## 2018-05-25 VITALS — BP 126/70 | HR 60 | Ht 62.0 in | Wt 143.0 lb

## 2018-05-25 DIAGNOSIS — IMO0002 Reserved for concepts with insufficient information to code with codable children: Secondary | ICD-10-CM

## 2018-05-25 DIAGNOSIS — I10 Essential (primary) hypertension: Secondary | ICD-10-CM

## 2018-05-25 DIAGNOSIS — Z0000 Encounter for general adult medical examination without abnormal findings: Secondary | ICD-10-CM

## 2018-05-25 DIAGNOSIS — E785 Hyperlipidemia, unspecified: Secondary | ICD-10-CM

## 2018-05-25 DIAGNOSIS — E119 Type 2 diabetes mellitus without complications: Secondary | ICD-10-CM

## 2018-05-25 DIAGNOSIS — E1139 Type 2 diabetes mellitus with other diabetic ophthalmic complication: Secondary | ICD-10-CM | POA: Diagnosis not present

## 2018-05-25 DIAGNOSIS — E1165 Type 2 diabetes mellitus with hyperglycemia: Secondary | ICD-10-CM

## 2018-05-25 LAB — COMPREHENSIVE METABOLIC PANEL
ALBUMIN: 4.4 g/dL (ref 3.5–5.2)
ALK PHOS: 38 U/L — AB (ref 39–117)
ALT: 14 U/L (ref 0–35)
AST: 15 U/L (ref 0–37)
BUN: 13 mg/dL (ref 6–23)
CALCIUM: 9.5 mg/dL (ref 8.4–10.5)
CHLORIDE: 104 meq/L (ref 96–112)
CO2: 29 mEq/L (ref 19–32)
CREATININE: 0.76 mg/dL (ref 0.40–1.20)
GFR: 79.87 mL/min (ref >60.00)
Glucose, Bld: 122 mg/dL — ABNORMAL HIGH (ref 70–99)
Potassium: 4.1 mEq/L (ref 3.5–5.1)
Sodium: 141 mEq/L (ref 135–145)
TOTAL PROTEIN: 6.5 g/dL (ref 6.0–8.3)
Total Bilirubin: 0.5 mg/dL (ref 0.2–1.2)

## 2018-05-25 LAB — LIPID PANEL
CHOLESTEROL: 125 mg/dL (ref 0–200)
HDL: 39.8 mg/dL (ref >39.00)
NONHDL: 85.63
Total CHOL/HDL Ratio: 3
Triglycerides: 235 mg/dL — ABNORMAL HIGH (ref 0.0–149.0)
VLDL: 47 mg/dL — AB (ref 0.0–40.0)

## 2018-05-25 LAB — HEMOGLOBIN A1C: HEMOGLOBIN A1C: 7 % — AB (ref 4.6–6.5)

## 2018-05-25 LAB — LDL CHOLESTEROL, DIRECT: LDL DIRECT: 53 mg/dL

## 2018-05-25 MED ORDER — GLUCOSE BLOOD VI STRP: ORAL_STRIP | 1 refills | Status: DC

## 2018-05-25 NOTE — Assessment & Plan Note (Signed)
Well controlled, no changes to meds. Encouraged heart healthy diet such as the DASH diet and exercise as tolerated.  °

## 2018-05-25 NOTE — Patient Instructions (Signed)

## 2018-05-25 NOTE — Patient Instructions (Signed)
Please schedule your next medicare wellness visit with me in 1 yr.  Continue to eat heart healthy diet (full of fruits, vegetables, whole grains, lean protein, water--limit salt, fat, and sugar intake) and increase physical activity as tolerated.  Continue doing brain stimulating activities (puzzles, reading, adult coloring books, staying active) to keep memory sharp.   Bring a copy of your living will and/or healthcare power of attorney to your next office visit.   Cynthia Burns , Thank you for taking time to come for your Medicare Wellness Visit. I appreciate your ongoing commitment to your health goals. Please review the following plan we discussed and let me know if I can assist you in the future.   These are the goals we discussed: Goals    . Weight (lb) < 135 lb (61.2 kg)       This is a list of the screening recommended for you and due dates:  Health Maintenance  Topic Date Due  . Pneumonia vaccines (2 of 2 - PPSV23) 05/26/2019*  . Flu Shot  07/22/2018  . Hemoglobin A1C  08/15/2018  . Mammogram  10/12/2018  . Eye exam for diabetics  05/21/2019  . Complete foot exam   05/26/2019  . Colon Cancer Screening  11/09/2023  . Tetanus Vaccine  09/13/2024  . DEXA scan (bone density measurement)  Completed  .  Hepatitis C: One time screening is recommended by Center for Disease Control  (CDC) for  adults born from [birth year] through 1964.   Completed  *Topic was postponed. The date shown is not the original due date.

## 2018-05-25 NOTE — Assessment & Plan Note (Signed)
Tolerating statin, encouraged heart healthy diet, avoid trans fats, minimize simple carbs and saturated fats. Increase exercise as tolerated 

## 2018-05-25 NOTE — Progress Notes (Signed)
[name], female   DOB: [birth year], 72 y.o.   MRN: [id number]     Subjective:  I acted as a Education administrator for Dr. Carollee Burns.  Cynthia Burns, Cynthia Burns   [name], female    DOB: 1946/09/28, 72 y.o.   MRN: [id number]  Chief Complaint  Patient presents with  . Hyperlipidemia  . Diabetes  . Hypertension    HPI  Patient is in today for follow up blood pressure, diabetes, and cholesterol.  HYPERTENSION   Blood pressure range-not checking   Chest pain- no      Dyspnea- no Lightheadedness- no   Edema- no  Other side effects - no   Medication compliance: good Low salt diet- yes    DIABETES    Blood Sugar ranges-running high-- because they had no stove  Polyuria- no New Visual problems- no  Hypoglycemic symptoms- no  Other side effects-no Medication compliance - good Last eye exam- last week Foot exam- today   HYPERLIPIDEMIA  Medication compliance- good RUQ pain- no  Muscle aches- no Other side effects-no   Patient Care Team: Ann Held, DO as PCP - General Josue Hector, MD as Consulting Physician (Cardiology) Shon Hough, MD as Consulting Physician (Ophthalmology) Lafayette Dragon, MD (Inactive) (Gastroenterology) Mcarthur Rossetti, MD as Consulting Physician (Orthopedic Surgery)   Past Medical History:  Diagnosis Date  . Diabetes mellitus without complication ([place])   . 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 HYPERTENSION   . MITRAL VALVE PROLAPSE   . OSTEOPENIA   . Overweight(278.02)   . PHARYNGITIS, ACUTE     Past Surgical History:  Procedure Laterality Date  . ABDOMINAL HYSTERECTOMY    . CHOLECYSTECTOMY      Family History  Problem Relation Age of Onset  . Emphysema Father   . Diabetes Father   . Heart disease Mother        Had a stent  . Hypertension Unknown   . Colon cancer Neg Hx   . BRCA 1/2 Neg Hx   . Breast cancer Neg Hx     Social History   Socioeconomic History  . Marital status: Married      Spouse name: Not on file  . Number of children: Not on file  . Years of education: Not on file  . Highest education level: Not on file  Occupational History  . Not on file  Social Needs  . Financial resource strain: Not on file  . Food insecurity:    Worry: Not on file    Inability: Not on file  . Transportation needs:    Medical: Not on file    Non-medical: Not on file  Tobacco Use  . Smoking status: Never Smoker  . Smokeless tobacco: Never Used  Substance and Sexual Activity  . Alcohol use: No    Alcohol/week: 0.0 oz  . Drug use: No  . Sexual activity: Not on file  Lifestyle  . Physical activity:    Days per week: Not on file    Minutes per session: Not on file  . Stress: Not on file  Relationships  . Social connections:    Talks on phone: Not on file    Gets together: Not on file    Attends religious service: Not on file    Active member of club or organization: Not on file    Attends meetings of clubs or organizations: Not on file    Relationship status:  Not on file  . Intimate partner violence:    Fear of current or ex partner: Not on file    Emotionally abused: Not on file    Physically abused: Not on file    Forced sexual activity: Not on file  Other Topics Concern  . Not on file  Social History Narrative  . Not on file    Outpatient Medications Prior to Visit  Medication Sig Dispense Refill  . amoxicillin (AMOXIL) 500 MG capsule Take 500 mg by mouth. For dental procedures    . aspirin EC 81 MG tablet Take 1 tablet (81 mg total) by mouth daily. 90 tablet 3  . Biotin (PA BIOTIN) 1000 MCG tablet Take 1,000 mcg by mouth daily.    . Cyanocobalamin (VITAMIN B-12 PO) Take 1 tablet by mouth daily.     . furosemide (LASIX) 20 MG tablet Take 0.5 tablets (10 mg total) daily by mouth. 45 tablet 3  . Lancets (ONETOUCH ULTRASOFT) lancets Use as instructed once daily.  Dx Code: E11.9 100 each 1  . losartan (COZAAR) 25 MG tablet Take 1 tablet (25 mg total) daily by  mouth. 90 tablet 3  . meclizine (ANTIVERT) 25 MG tablet Take 1 tablet (25 mg total) 3 (three) times daily as needed by mouth for dizziness. 90 tablet 3  . metFORMIN (GLUCOPHAGE-XR) 500 MG 24 hr tablet Take 2 tablets (1,000 mg total) by mouth 2 (two) times daily. 360 tablet 1  . nebivolol (BYSTOLIC) 5 MG tablet Take 1 tablet (5 mg total) daily by mouth. 90 tablet 3  . omeprazole (PRILOSEC) 40 MG capsule Take 1 capsule (40 mg total) daily by mouth. 90 capsule 3  . rosuvastatin (CRESTOR) 20 MG tablet Take 1 tablet (20 mg total) by mouth daily. 90 tablet 1  . sitaGLIPtin (JANUVIA) 100 MG tablet Take 1 tablet (100 mg total) by mouth daily. 90 tablet 1  . VITAMIN D, CHOLECALCIFEROL, PO Take 1 tablet by mouth daily.     . ONE TOUCH ULTRA TEST test strip CHECK BLOOD SUGAR DAILY E11.9 100 each 1  . amoxicillin (AMOXIL) 875 MG tablet Take 1 tablet (875 mg total) 2 (two) times daily by mouth. 14 tablet 0   No facility-administered medications prior to visit.     Allergies  Allergen Reactions  . Mucinex [Guaifenesin Er] Anaphylaxis  . Codeine Nausea And Vomiting  . Advicor [Niacin-Lovastatin Er] Rash  . Amlodipine Rash  . Lisinopril-Hydrochlorothiazide Rash  . Other Rash    Eye drops with preservatives.    . Ramipril Rash  . Sulfonamide Derivatives Rash    Review of Systems  Constitutional: Negative for fever and malaise/fatigue.  HENT: Negative for congestion.   Eyes: Negative for blurred vision.  Respiratory: Negative for cough and shortness of breath.   Cardiovascular: Negative for chest pain, palpitations and leg swelling.  Gastrointestinal: Negative for vomiting.  Musculoskeletal: Negative for back pain.  Skin: Negative for rash.  Neurological: Negative for loss of consciousness and headaches.       Objective:    Physical Exam  Constitutional: She is oriented to person, place, and time. She appears well-developed and well-nourished.  HENT:  Head: Normocephalic and atraumatic.    Eyes: Conjunctivae and EOM are normal.  Neck: Normal range of motion. Neck supple. No JVD present. Carotid bruit is not present. No thyromegaly present.  Cardiovascular: Normal rate, regular rhythm and normal heart sounds.  No murmur heard. Pulmonary/Chest: Effort normal and breath sounds normal. No respiratory distress. She  has no wheezes. She has no rales. She exhibits no tenderness.  Musculoskeletal: She exhibits no edema.  Neurological: She is alert and oriented to person, place, and time.  Psychiatric: She has a normal mood and affect.  Nursing note and vitals reviewed.  Diabetic Foot Exam - Simple   Simple Foot Form Diabetic Foot exam was performed with the following findings:  Yes 05/25/2018 12:48 PM  Visual Inspection No deformities, no ulcerations, no other skin breakdown bilaterally:  Yes Sensation Testing Intact to touch and monofilament testing bilaterally:  Yes Pulse Check Posterior Tibialis and Dorsalis pulse intact bilaterally:  Yes Comments     BP 126/70 (BP Location: Left Arm, Cuff Size: Normal)   Pulse 60   Temp 97.8 F (36.6 C) (Oral)   Resp 16   Ht '5\' 2"'  (1.575 m)   Wt 143 lb 12.8 oz (65.2 kg)   SpO2 98%   BMI 26.30 kg/m  Wt Readings from Last 3 Encounters:  05/25/18 143 lb 12.8 oz (65.2 kg)  11/19/17 141 lb (64 kg)  11/03/17 142 lb 8 oz (64.6 kg)   BP Readings from Last 3 Encounters:  05/25/18 126/70  11/19/17 136/79  11/03/17 126/70     Immunization History  Administered Date(s) Administered  . Influenza Whole 10/20/2007  . Influenza, High Dose Seasonal PF 09/01/2016  . Influenza,inj,Quad PF,6+ Mos 09/05/2013, 09/05/2014, 09/13/2015  . Influenza-Unspecified 09/21/2017  . Pneumococcal Conjugate-13 02/27/2014  . Pneumococcal Polysaccharide-23 09/06/2011  . Zoster 09/14/2013  . Zoster Recombinat (Shingrix) 06/13/2017    Health Maintenance  Topic Date Due  . PNA vac Low Risk Adult (2 of 2 - PPSV23) 09/05/2016  . FOOT EXAM  03/23/2018  .  OPHTHALMOLOGY EXAM  05/11/2018  . INFLUENZA VACCINE  07/22/2018  . HEMOGLOBIN A1C  08/15/2018  . MAMMOGRAM  10/12/2018  . COLONOSCOPY  11/09/2023  . TETANUS/TDAP  09/13/2024  . DEXA SCAN  Completed  . Hepatitis C Screening  Completed    Lab Results  Component Value Date   WBC 6.0 09/25/2016   HGB 13.0 09/25/2016   HCT 38.8 09/25/2016   PLT 257.0 09/25/2016   GLUCOSE 128 (H) 02/15/2018   CHOL 123 02/15/2018   TRIG 144.0 02/15/2018   HDL 41.30 02/15/2018   LDLDIRECT 78.3 07/12/2013   LDLCALC 53 02/15/2018   ALT 14 02/15/2018   AST 16 02/15/2018   NA 141 02/15/2018   K 3.7 02/15/2018   CL 103 02/15/2018   CREATININE 0.83 02/15/2018   BUN 18 02/15/2018   CO2 30 02/15/2018   HGBA1C 6.8 (H) 02/15/2018   MICROALBUR 0.9 11/19/2017    No results found for: TSH Lab Results  Component Value Date   WBC 6.0 09/25/2016   HGB 13.0 09/25/2016   HCT 38.8 09/25/2016   MCV 88.1 09/25/2016   PLT 257.0 09/25/2016   Lab Results  Component Value Date   NA 141 02/15/2018   K 3.7 02/15/2018   CO2 30 02/15/2018   GLUCOSE 128 (H) 02/15/2018   BUN 18 02/15/2018   CREATININE 0.83 02/15/2018   BILITOT 0.5 02/15/2018   ALKPHOS 41 02/15/2018   AST 16 02/15/2018   ALT 14 02/15/2018   PROT 6.9 02/15/2018   ALBUMIN 4.3 02/15/2018   CALCIUM 9.7 02/15/2018   GFR 72.20 02/15/2018   Lab Results  Component Value Date   CHOL 123 02/15/2018   Lab Results  Component Value Date   HDL 41.30 02/15/2018   Lab Results  Component Value Date  [place] 53 02/15/2018   Lab Results  Component Value Date   TRIG 144.0 02/15/2018   Lab Results  Component Value Date   CHOLHDL 3 02/15/2018   Lab Results  Component Value Date   HGBA1C 6.8 (H) 02/15/2018         Assessment & Plan:   Problem List Items Addressed This Visit      Unprioritized   DM (diabetes mellitus) type II uncontrolled with eye manifestation ([place]) - Primary    hgba1c to be checked  minimize simple carbs. Increase  exercise as tolerated. Continue current meds       Relevant Medications   glucose blood (ONE TOUCH ULTRA TEST) test strip   Other Relevant Orders   Hemoglobin A1c   Essential hypertension    Well controlled, no changes to meds. Encouraged heart healthy diet such as the DASH diet and exercise as tolerated.       Relevant Orders   Comprehensive metabolic panel   Hyperlipidemia LDL goal <70    Tolerating statin, encouraged heart healthy diet, avoid trans fats, minimize simple carbs and saturated fats. Increase exercise as tolerated      Relevant Orders   Comprehensive metabolic panel   Lipid panel   Type 2 diabetes mellitus not at goal [facility])      I have changed Pollie Meyer. Woehrle's ONE TOUCH ULTRA TEST to glucose blood. I am also having her maintain her aspirin EC, Cyanocobalamin (VITAMIN B-12 PO), (VITAMIN D, CHOLECALCIFEROL, PO), Biotin, furosemide, losartan, nebivolol, omeprazole, meclizine, rosuvastatin, metFORMIN, sitaGLIPtin, onetouch ultrasoft, and amoxicillin.  Meds ordered this encounter  Medications  . glucose blood (ONE TOUCH ULTRA TEST) test strip    Sig: CHECK BLOOD SUGAR DAILY E11.9    Dispense:  100 each    Refill:  1    CMA served as scribe during this visit. History, Physical and Plan performed by medical provider. Documentation and orders reviewed and attested to.  Ann Held, DO

## 2018-05-25 NOTE — Assessment & Plan Note (Signed)
hgba1c to be checked minimize simple carbs. Increase exercise as tolerated. Continue current meds 

## 2018-05-25 NOTE — Progress Notes (Signed)
Reviewed  Yvonne R Lowne Chase, DO  

## 2018-05-27 ENCOUNTER — Telehealth: Payer: Self-pay | Admitting: *Deleted

## 2018-05-27 NOTE — Telephone Encounter (Signed)
Copied from [place] [id number]. Topic: General - Other >> May 27, 2018  3:16 PM Mcneil, Ja-Kwan wrote: Reason for CRM: Pt requested call back. Cb# [phone]

## 2018-05-28 NOTE — Telephone Encounter (Signed)
Spoke with pt in regards to Rx

## 2018-06-02 ENCOUNTER — Encounter: Payer: Self-pay | Admitting: Family Medicine

## 2018-06-25 ENCOUNTER — Encounter: Payer: Self-pay | Admitting: *Deleted

## 2018-06-28 ENCOUNTER — Other Ambulatory Visit: Payer: Self-pay

## 2018-06-28 DIAGNOSIS — I10 Essential (primary) hypertension: Secondary | ICD-10-CM

## 2018-06-28 MED ORDER — LOSARTAN POTASSIUM 25 MG PO TABS: 25.0000 mg | ORAL_TABLET | Freq: Every day | ORAL | 0 refills | Status: DC

## 2018-07-08 ENCOUNTER — Telehealth: Payer: Self-pay | Admitting: Family Medicine

## 2018-07-08 NOTE — Telephone Encounter (Signed)
Copied from [place] [phone]. Topic: Quick Communication - Rx Refill/Question >> Jul 08, 2018  1:34 PM Synthia Innocent wrote: Medication: glucose blood (ONE TOUCH ULTRA TEST) test strip, [facility] is out   Has the patient contacted their pharmacy? Yes.   (Agent: If no, request that the patient contact the pharmacy for the refill.) (Agent: If yes, when and what did the pharmacy advise?)  Preferred Pharmacy (with phone number or street name): [facility] [address]   Agent: Please be advised that RX refills may take up to 3 business days. We ask that you follow-up with your pharmacy.

## 2018-07-09 ENCOUNTER — Other Ambulatory Visit: Payer: Self-pay | Admitting: *Deleted

## 2018-07-09 DIAGNOSIS — E1139 Type 2 diabetes mellitus with other diabetic ophthalmic complication: Secondary | ICD-10-CM

## 2018-07-09 DIAGNOSIS — E1165 Type 2 diabetes mellitus with hyperglycemia: Principal | ICD-10-CM

## 2018-07-09 DIAGNOSIS — IMO0002 Reserved for concepts with insufficient information to code with codable children: Secondary | ICD-10-CM

## 2018-07-09 MED ORDER — GLUCOSE BLOOD VI STRP: ORAL_STRIP | 1 refills | Status: DC

## 2018-07-09 NOTE — Telephone Encounter (Signed)
Rx for test strips sent to [facility] per patient request

## 2018-07-26 ENCOUNTER — Ambulatory Visit: Payer: Medicare Other | Admitting: Family Medicine

## 2018-09-03 ENCOUNTER — Other Ambulatory Visit: Payer: Self-pay | Admitting: Family Medicine

## 2018-09-03 DIAGNOSIS — Z1231 Encounter for screening mammogram for malignant neoplasm of breast: Secondary | ICD-10-CM

## 2018-09-18 DIAGNOSIS — Z23 Encounter for immunization: Secondary | ICD-10-CM | POA: Diagnosis not present

## 2018-09-29 ENCOUNTER — Other Ambulatory Visit: Payer: Self-pay | Admitting: Cardiovascular Disease

## 2018-09-29 DIAGNOSIS — I10 Essential (primary) hypertension: Secondary | ICD-10-CM

## 2018-09-29 MED ORDER — FUROSEMIDE 20 MG PO TABS: 10.0000 mg | ORAL_TABLET | Freq: Every day | ORAL | 0 refills | Status: DC

## 2018-09-29 MED ORDER — LOSARTAN POTASSIUM 25 MG PO TABS: 25.0000 mg | ORAL_TABLET | Freq: Every day | ORAL | 0 refills | Status: DC

## 2018-09-29 MED ORDER — NEBIVOLOL HCL 5 MG PO TABS: 5.0000 mg | ORAL_TABLET | Freq: Every day | ORAL | 0 refills | Status: DC

## 2018-09-29 NOTE — Telephone Encounter (Signed)
Pt's medications were sent to pt's pharmacy as requested. Confirmation received.  

## 2018-10-07 ENCOUNTER — Ambulatory Visit: Payer: Medicare Other | Admitting: Family Medicine

## 2018-10-13 ENCOUNTER — Ambulatory Visit
Admission: RE | Admit: 2018-10-13 | Discharge: 2018-10-13 | Disposition: A | Discharge status: Home / Self Care | Payer: Medicare Other | Source: Ambulatory Visit | Attending: Family Medicine | Admitting: Family Medicine

## 2018-10-13 DIAGNOSIS — Z1231 Encounter for screening mammogram for malignant neoplasm of breast: Secondary | ICD-10-CM | POA: Diagnosis not present

## 2018-10-13 LAB — HM MAMMOGRAPHY: HM Mammogram: NORMAL (ref 0–4)

## 2018-10-19 ENCOUNTER — Ambulatory Visit (INDEPENDENT_AMBULATORY_CARE_PROVIDER_SITE_OTHER): Payer: Medicare Other | Admitting: Family Medicine

## 2018-10-19 ENCOUNTER — Encounter: Payer: Self-pay | Admitting: Family Medicine

## 2018-10-19 VITALS — BP 132/67 | HR 59 | Temp 97.8°F | Resp 16 | Ht 62.0 in | Wt 144.0 lb

## 2018-10-19 DIAGNOSIS — E785 Hyperlipidemia, unspecified: Secondary | ICD-10-CM

## 2018-10-19 DIAGNOSIS — E119 Type 2 diabetes mellitus without complications: Secondary | ICD-10-CM | POA: Diagnosis not present

## 2018-10-19 DIAGNOSIS — E1151 Type 2 diabetes mellitus with diabetic peripheral angiopathy without gangrene: Secondary | ICD-10-CM

## 2018-10-19 DIAGNOSIS — I10 Essential (primary) hypertension: Secondary | ICD-10-CM

## 2018-10-19 DIAGNOSIS — IMO0002 Reserved for concepts with insufficient information to code with codable children: Secondary | ICD-10-CM

## 2018-10-19 DIAGNOSIS — E1165 Type 2 diabetes mellitus with hyperglycemia: Secondary | ICD-10-CM

## 2018-10-19 LAB — LIPID PANEL
CHOLESTEROL: 117 mg/dL (ref 0–200)
HDL: 42.3 mg/dL (ref >39.00)
LDL CALC: 46 mg/dL (ref 0–99)
NonHDL: 74.88
TRIGLYCERIDES: 145 mg/dL (ref 0.0–149.0)
Total CHOL/HDL Ratio: 3
VLDL: 29 mg/dL (ref 0.0–40.0)

## 2018-10-19 LAB — COMPREHENSIVE METABOLIC PANEL
ALK PHOS: 41 U/L (ref 39–117)
ALT: 13 U/L (ref 0–35)
AST: 15 U/L (ref 0–37)
Albumin: 4.6 g/dL (ref 3.5–5.2)
BUN: 15 mg/dL (ref 6–23)
CHLORIDE: 104 meq/L (ref 96–112)
CO2: 29 mEq/L (ref 19–32)
CREATININE: 0.78 mg/dL (ref 0.40–1.20)
Calcium: 9.6 mg/dL (ref 8.4–10.5)
GFR: 77.42 mL/min (ref >60.00)
GLUCOSE: 136 mg/dL — AB (ref 70–99)
POTASSIUM: 4.4 meq/L (ref 3.5–5.1)
SODIUM: 140 meq/L (ref 135–145)
TOTAL PROTEIN: 6.7 g/dL (ref 6.0–8.3)
Total Bilirubin: 0.5 mg/dL (ref 0.2–1.2)

## 2018-10-19 LAB — MICROALBUMIN / CREATININE URINE RATIO
Creatinine,U: 127.8 mg/dL
Microalb Creat Ratio: 0.6 mg/g (ref 0.0–30.0)
Microalb, Ur: 0.8 mg/dL (ref 0.0–1.9)

## 2018-10-19 LAB — HEMOGLOBIN A1C: HEMOGLOBIN A1C: 6.9 % — AB (ref 4.6–6.5)

## 2018-10-19 NOTE — Assessment & Plan Note (Signed)
Encouraged heart healthy diet, increase exercise, avoid trans fats, consider a krill oil cap daily 

## 2018-10-19 NOTE — Assessment & Plan Note (Signed)
hgba1c acceptable, minimize simple carbs. Increase exercise as tolerated. Continue current meds 

## 2018-10-19 NOTE — Assessment & Plan Note (Signed)
Well controlled, no changes to meds. Encouraged heart healthy diet such as the DASH diet and exercise as tolerated.  °

## 2018-10-19 NOTE — Progress Notes (Signed)
[name], female    DOB: 18-May-1946  Age: 72 y.o. MRN: [id number]    Subjective:  Subjective  HPI [name] presents for f/u dm ,bp and cholesterol   No complaints  HPI HYPERTENSION   Blood pressure range-not checking   Chest pain- no      Dyspnea- no Lightheadedness- no   Edema- no  Other side effects - no   Medication compliance: good Low salt diet- yes    DIABETES    Blood Sugar ranges-good per pt  Polyuria- no New Visual problems- no  Hypoglycemic symptoms- no  Other side effects-no Medication compliance - good Last eye exam- recent Foot exam- today   HYPERLIPIDEMIA  Medication compliance- good  RUQ pain- no  Muscle aches- no Other side effects-no          . Review of Systems  Constitutional: Negative for appetite change, diaphoresis, fatigue and unexpected weight change.  Eyes: Negative for pain, redness and visual disturbance.  Respiratory: Negative for cough, chest tightness, shortness of breath and wheezing.   Cardiovascular: Negative for chest pain, palpitations and leg swelling.  Endocrine: Negative for cold intolerance, heat intolerance, polydipsia, polyphagia and polyuria.  Genitourinary: Negative for difficulty urinating, dysuria and frequency.  Neurological: Negative for dizziness, light-headedness, numbness and headaches.    History Past Medical History:  Diagnosis Date  . Diabetes mellitus without complication ([place])   . 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 HYPERTENSION   . MITRAL VALVE PROLAPSE   . OSTEOPENIA   . Overweight(278.02)   . PHARYNGITIS, ACUTE     She has a past surgical history that includes Abdominal hysterectomy and Cholecystectomy.   Her family history includes Diabetes in her father; Emphysema in her father; Heart disease in her mother; Hypertension in her unknown relative.She reports that she has never smoked. She has never used smokeless tobacco. She reports that she does not drink  alcohol or use drugs.  Current Outpatient Medications on File Prior to Visit  Medication Sig Dispense Refill  . amoxicillin (AMOXIL) 500 MG capsule Take 500 mg by mouth. For dental procedures    . aspirin EC 81 MG tablet Take 1 tablet (81 mg total) by mouth daily. 90 tablet 3  . Biotin (PA BIOTIN) 1000 MCG tablet Take 1,000 mcg by mouth daily.    . Cyanocobalamin (VITAMIN B-12 PO) Take 1 tablet by mouth daily.     . furosemide (LASIX) 20 MG tablet Take 0.5 tablets (10 mg total) by mouth daily. Please keep upcoming appt in December with Dr. Johnsie Cancel for future refills. Thank you 45 tablet 0  . glucose blood (ONE TOUCH ULTRA TEST) test strip CHECK BLOOD SUGAR DAILY E11.9 100 each 1  . Lancets (ONETOUCH ULTRASOFT) lancets Use as instructed once daily.  Dx Code: E11.9 100 each 1  . losartan (COZAAR) 25 MG tablet Take 1 tablet (25 mg total) by mouth daily. Please keep upcoming appt in December with Dr. Johnsie Cancel for future refills. Thank you 90 tablet 0  . meclizine (ANTIVERT) 25 MG tablet Take 1 tablet (25 mg total) 3 (three) times daily as needed by mouth for dizziness. 90 tablet 3  . metFORMIN (GLUCOPHAGE-XR) 500 MG 24 hr tablet Take 2 tablets (1,000 mg total) by mouth 2 (two) times daily. 360 tablet 1  . nebivolol (BYSTOLIC) 5 MG tablet Take 1 tablet (5 mg total) by mouth daily. Please keep upcoming appt in December with Dr. Johnsie Cancel for future refills.  Thank you 90 tablet 0  . rosuvastatin (CRESTOR) 20 MG tablet Take 1 tablet (20 mg total) by mouth daily. 90 tablet 1  . sitaGLIPtin (JANUVIA) 100 MG tablet Take 1 tablet (100 mg total) by mouth daily. 90 tablet 1  . VITAMIN D, CHOLECALCIFEROL, PO Take 1 tablet by mouth daily.      No current facility-administered medications on file prior to visit.      Objective:  Objective  Physical Exam  Constitutional: She is oriented to person, place, and time. She appears well-developed and well-nourished.  HENT:  Head: Normocephalic and atraumatic.  Eyes:  Conjunctivae and EOM are normal.  Neck: Normal range of motion. Neck supple. No JVD present. Carotid bruit is not present. No thyromegaly present.  Cardiovascular: Normal rate, regular rhythm and normal heart sounds.  No murmur heard. Pulmonary/Chest: Effort normal and breath sounds normal. No respiratory distress. She has no wheezes. She has no rales. She exhibits no tenderness.  Musculoskeletal: She exhibits no edema.  Neurological: She is alert and oriented to person, place, and time.  Psychiatric: She has a normal mood and affect.  Nursing note and vitals reviewed.  Diabetic Foot Exam - Simple   Simple Foot Form Diabetic Foot exam was performed with the following findings:  Yes 10/19/2018 12:15 PM  Visual Inspection No deformities, no ulcerations, no other skin breakdown bilaterally:  Yes Sensation Testing Intact to touch and monofilament testing bilaterally:  Yes Pulse Check Posterior Tibialis and Dorsalis pulse intact bilaterally:  Yes Comments     BP 132/67 (BP Location: Right Arm, Patient Position: Sitting, Cuff Size: Small)   Pulse (!) 59   Temp 97.8 F (36.6 C) (Oral)   Resp 16   Ht 5\' 2"  (1.575 m)   Wt 144 lb (65.3 kg)   SpO2 100%   BMI 26.34 kg/m  Wt Readings from Last 3 Encounters:  10/19/18 144 lb (65.3 kg)  05/25/18 143 lb (64.9 kg)  05/25/18 143 lb 12.8 oz (65.2 kg)     Lab Results  Component Value Date   WBC 6.0 09/25/2016   HGB 13.0 09/25/2016   HCT 38.8 09/25/2016   PLT 257.0 09/25/2016   GLUCOSE 122 (H) 05/25/2018   CHOL 125 05/25/2018   TRIG 235.0 (H) 05/25/2018   HDL 39.80 05/25/2018   LDLDIRECT 53.0 05/25/2018   LDLCALC 53 02/15/2018   ALT 14 05/25/2018   AST 15 05/25/2018   NA 141 05/25/2018   K 4.1 05/25/2018   CL 104 05/25/2018   CREATININE 0.76 05/25/2018   BUN 13 05/25/2018   CO2 29 05/25/2018   HGBA1C 7.0 (H) 05/25/2018   MICROALBUR 0.9 11/19/2017    Mm 3d Screen Breast Bilateral  Result Date: 10/13/2018 CLINICAL DATA:   Screening. EXAM: DIGITAL SCREENING BILATERAL MAMMOGRAM WITH TOMO AND CAD COMPARISON:  Previous exam(s). ACR Breast Density Category b: There are scattered areas of fibroglandular density. FINDINGS: There are no findings suspicious for malignancy. Images were processed with CAD. IMPRESSION: No mammographic evidence of malignancy. A result letter of this screening mammogram will be mailed directly to the patient. RECOMMENDATION: Screening mammogram in one year. (Code:SM-B-01Y) BI-RADS CATEGORY  1: Negative. Electronically Signed   By: Claudie Revering M.D.   On: 10/13/2018 13:14     Assessment & Plan:  Plan  I am having Carlene Coria maintain her aspirin EC, Cyanocobalamin (VITAMIN B-12 PO), (VITAMIN D, CHOLECALCIFEROL, PO), Biotin, meclizine, rosuvastatin, metFORMIN, sitaGLIPtin, onetouch ultrasoft, amoxicillin, glucose blood, nebivolol, losartan, and furosemide.  No orders of the defined types were placed in this encounter.   Problem List Items Addressed This Visit      Unprioritized   Diet-controlled diabetes mellitus ([place])    hgba1c acceptable, minimize simple carbs. Increase exercise as tolerated. Continue current meds       Essential hypertension - Primary    Well controlled, no changes to meds. Encouraged heart healthy diet such as the DASH diet and exercise as tolerated.       Relevant Orders   Lipid panel   Hemoglobin A1c   Comprehensive metabolic panel   Microalbumin / creatinine urine ratio   Hyperlipidemia LDL goal <70    Encouraged heart healthy diet, increase exercise, avoid trans fats, consider a krill oil cap daily      Relevant Orders   Lipid panel   Hemoglobin A1c   Comprehensive metabolic panel   Microalbumin / creatinine urine ratio      Follow-up: Return in about 6 months (around 04/20/2019).  Ann Held, DO

## 2018-10-19 NOTE — Patient Instructions (Signed)

## 2018-10-25 ENCOUNTER — Telehealth: Payer: Self-pay | Admitting: *Deleted

## 2018-10-25 NOTE — Telephone Encounter (Signed)
Copied from [place] [id number]. Topic: General - Other >> Oct 25, 2018  2:05 PM Cynthia Burns wrote: Reason for CRM: Patient called to set up an appointment to see Dr Etter SjogrenCheri Rous on 04/18/19 but she stated that if she only need to have blood work done please call her back and let her know. She stated that she already have an appointment scheduled for her physical in June and don't want to wait an appointment that someone else could have. So please let patient know if she should only come in to have blood work to check HbA1C in the month of April of 2020. Ph# [phone]

## 2018-10-26 NOTE — Telephone Encounter (Signed)
Spoke w/ Pt- informed PCP is wanting to see her back in office in 6 months not just for blood work. Pt verbalized understanding.

## 2018-11-16 NOTE — Progress Notes (Signed)
[name], female   DOB: [birth year], 72 y.o.   MRN: [id number]     Cynthia Burns returns today for followup. 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  Last TTE EF 65-70% grade one diastolic no AR She had an uncomplicated lap cholecystectomy in 2017.  2014 had cardiac CTA with calcium score 0 normal left dominant arteries Normal aortic root  2.7 cm and non coronary sinus 3.5 cm and 3.7 cm from coronal view  No cardiac complaints Weight is down a bit   ROS: Denies fever, malais, weight loss, blurry vision, decreased visual acuity, cough, sputum, SOB, hemoptysis, pleuritic pain, palpitaitons, heartburn, abdominal pain, melena, lower extremity edema, claudication, or rash.  All other systems reviewed and negative  General: Affect appropriate Healthy:  appears stated age 5: normal Neck supple with no adenopathy JVP normal no bruits no thyromegaly Lungs clear with no wheezing and good diaphragmatic motion Heart:  S1/S2 no murmur, no rub, gallop or click PMI normal Abdomen: benighn, BS positve, no tenderness, no AAA post hysterectomy and lapcholy no bruit.  No HSM or HJR Distal pulses intact with no bruits No edema Neuro non-focal Skin warm and dry No muscular weakness   Current Outpatient Medications  Medication Sig Dispense Refill  . amoxicillin (AMOXIL) 500 MG capsule Take 500 mg by mouth. For dental procedures    . aspirin EC 81 MG tablet Take 1 tablet (81 mg total) by mouth daily. 90 tablet 3  . Biotin (PA BIOTIN) 1000 MCG tablet Take 1,000 mcg by mouth daily.    . calcium carbonate (OSCAL) 1500 (600 Ca) MG TABS tablet Take 600 mg of elemental calcium by mouth daily with breakfast.    . Cyanocobalamin  (VITAMIN B-12 PO) Take 1 tablet by mouth daily.     . furosemide (LASIX) 20 MG tablet Take 0.5 tablets (10 mg total) by mouth daily. 45 tablet 3  . glucose blood (ONE TOUCH ULTRA TEST) test strip CHECK BLOOD SUGAR DAILY E11.9 100 each 1  . Lancets (ONETOUCH ULTRASOFT) lancets Use as instructed once daily.  Dx Code: E11.9 100 each 1  . losartan (COZAAR) 25 MG tablet Take 1 tablet (25 mg total) by mouth daily. 90 tablet 3  . meclizine (ANTIVERT) 25 MG tablet Take 1 tablet (25 mg total) 3 (three) times daily as needed by mouth for dizziness. 90 tablet 3  . metFORMIN (GLUCOPHAGE-XR) 500 MG 24 hr tablet Take 2 tablets (1,000 mg total) by mouth 2 (two) times daily. 360 tablet 1  . nebivolol (BYSTOLIC) 5 MG tablet Take 1 tablet (5 mg total) by mouth daily. 90 tablet 3  . rosuvastatin (CRESTOR) 20 MG tablet Take 1 tablet (20 mg total) by mouth daily. 90 tablet 3  . sitaGLIPtin (JANUVIA) 100 MG tablet Take 1 tablet (100 mg total) by mouth daily. 90 tablet 1  . VITAMIN D, CHOLECALCIFEROL, PO Take 1 tablet by mouth daily.      No current facility-administered medications for this visit.     Allergies  Mucinex [guaifenesin er]; Codeine; Advicor [niacin-lovastatin er]; Amlodipine; Lisinopril-hydrochlorothiazide; Other; Ramipril; and Sulfonamide derivatives  Electrocardiogram:  11/23/18 SR rate 63 normal   Assessment and Plan SVA: echo 10/10/16  benign no AR  Dilatation is in sinus not root f/u TTE with attention to SA basal View to measure sinuses  DM: Discussed low carb diet.  Target hemoglobin A1c is 6.5 or less.  Continue current medications. Obesity: Exercise and low carb diet discussed Chol:  Lab Results  Component Value Date   LDLCALC 46 10/19/2018   HTN:  Well controlled.  Continue current medications and low sodium Dash type diet.   Edema: stable can consider adding diuretic in future if needed  GB: post lap choly with improved symptoms    Cynthia Burns

## 2018-11-22 ENCOUNTER — Telehealth: Payer: Self-pay | Admitting: Cardiovascular Disease

## 2018-11-22 DIAGNOSIS — I10 Essential (primary) hypertension: Secondary | ICD-10-CM

## 2018-11-22 MED ORDER — LOSARTAN POTASSIUM 25 MG PO TABS: 25.0000 mg | ORAL_TABLET | Freq: Every day | ORAL | 0 refills | Status: DC

## 2018-11-22 NOTE — Telephone Encounter (Signed)
New Message          *STAT* If patient is at the pharmacy, call can be transferred to refill team.   1. Which medications need to be refilled? (please list name of each medication and dose if known) [place]   2. Which pharmacy/location (including street and city if local pharmacy) is medication to be sent to? [facility]  3. Do they need a 30 day or 90 day supply? [place]

## 2018-11-22 NOTE — Telephone Encounter (Signed)
Sent in refill

## 2018-11-23 ENCOUNTER — Ambulatory Visit (INDEPENDENT_AMBULATORY_CARE_PROVIDER_SITE_OTHER): Payer: Medicare Other | Admitting: Cardiovascular Disease

## 2018-11-23 ENCOUNTER — Encounter: Payer: Self-pay | Admitting: Cardiovascular Disease

## 2018-11-23 VITALS — BP 132/82 | HR 63 | Ht 62.0 in | Wt 142.8 lb

## 2018-11-23 DIAGNOSIS — E1151 Type 2 diabetes mellitus with diabetic peripheral angiopathy without gangrene: Secondary | ICD-10-CM | POA: Diagnosis not present

## 2018-11-23 DIAGNOSIS — E785 Hyperlipidemia, unspecified: Secondary | ICD-10-CM

## 2018-11-23 DIAGNOSIS — I10 Essential (primary) hypertension: Secondary | ICD-10-CM

## 2018-11-23 DIAGNOSIS — Q2543 Congenital aneurysm of aorta: Secondary | ICD-10-CM

## 2018-11-23 DIAGNOSIS — E1165 Type 2 diabetes mellitus with hyperglycemia: Secondary | ICD-10-CM

## 2018-11-23 DIAGNOSIS — Q2549 Other congenital malformations of aorta: Secondary | ICD-10-CM | POA: Diagnosis not present

## 2018-11-23 DIAGNOSIS — IMO0002 Reserved for concepts with insufficient information to code with codable children: Secondary | ICD-10-CM

## 2018-11-23 MED ORDER — LOSARTAN POTASSIUM 25 MG PO TABS: 25.0000 mg | ORAL_TABLET | Freq: Every day | ORAL | 3 refills | Status: DC

## 2018-11-23 MED ORDER — NEBIVOLOL HCL 5 MG PO TABS: 5.0000 mg | ORAL_TABLET | Freq: Every day | ORAL | 3 refills | Status: DC

## 2018-11-23 MED ORDER — FUROSEMIDE 20 MG PO TABS: 10.0000 mg | ORAL_TABLET | Freq: Every day | ORAL | 3 refills | Status: DC

## 2018-11-23 MED ORDER — ROSUVASTATIN CALCIUM 20 MG PO TABS: 20.0000 mg | ORAL_TABLET | Freq: Every day | ORAL | 3 refills | Status: DC

## 2018-11-23 NOTE — Patient Instructions (Signed)
Medication Instructions:   If you need a refill on your cardiac medications before your next appointment, please call your pharmacy.   Lab work:  If you have labs (blood work) drawn today and your tests are completely normal, you will receive your results only by: Marland Kitchen MyChart Message (if you have MyChart) OR . A paper copy in the mail If you have any lab test that is abnormal or we need to change your treatment, we will call you to review the results.  Testing/Procedures: Your physician has requested that you have an echocardiogram. Echocardiography is a painless test that uses sound waves to create images of your heart. It provides your doctor with information about the size and shape of your heart and how well your heart's chambers and valves are working. This procedure takes approximately one hour. There are no restrictions for this procedure.  Follow-Up: At [facility], you and your health needs are our priority.  As part of our continuing mission to provide you with exceptional heart care, we have created designated Provider Care Teams.  These Care Teams include your primary Cardiologist (physician) and Advanced Practice Providers (APPs -  Physician Assistants and Nurse Practitioners) who all work together to provide you with the care you need, when you need it. You will need a follow up appointment in 1 years.  Please call our office 2 months in advance to schedule this appointment.  You may see Jenkins Rouge, MD or one of the following Advanced Practice Providers on your designated Care Team:   Truitt Merle, NP Cecilie Kicks, NP . Kathyrn Drown, NP

## 2018-11-24 ENCOUNTER — Other Ambulatory Visit: Payer: Self-pay

## 2018-11-24 ENCOUNTER — Ambulatory Visit (HOSPITAL_COMMUNITY): Payer: Medicare Other | Attending: Cardiology

## 2018-11-24 DIAGNOSIS — E1165 Type 2 diabetes mellitus with hyperglycemia: Secondary | ICD-10-CM | POA: Insufficient documentation

## 2018-11-24 DIAGNOSIS — Q2549 Other congenital malformations of aorta: Secondary | ICD-10-CM | POA: Diagnosis not present

## 2018-11-24 DIAGNOSIS — E1151 Type 2 diabetes mellitus with diabetic peripheral angiopathy without gangrene: Secondary | ICD-10-CM | POA: Diagnosis not present

## 2018-11-24 DIAGNOSIS — E785 Hyperlipidemia, unspecified: Secondary | ICD-10-CM | POA: Insufficient documentation

## 2018-11-24 DIAGNOSIS — IMO0002 Reserved for concepts with insufficient information to code with codable children: Secondary | ICD-10-CM

## 2018-11-24 DIAGNOSIS — I10 Essential (primary) hypertension: Secondary | ICD-10-CM | POA: Diagnosis not present

## 2018-12-30 ENCOUNTER — Other Ambulatory Visit: Payer: Self-pay | Admitting: Family Medicine

## 2018-12-30 DIAGNOSIS — E1165 Type 2 diabetes mellitus with hyperglycemia: Principal | ICD-10-CM

## 2018-12-30 DIAGNOSIS — E119 Type 2 diabetes mellitus without complications: Secondary | ICD-10-CM

## 2018-12-30 DIAGNOSIS — E1151 Type 2 diabetes mellitus with diabetic peripheral angiopathy without gangrene: Secondary | ICD-10-CM

## 2018-12-30 DIAGNOSIS — IMO0002 Reserved for concepts with insufficient information to code with codable children: Secondary | ICD-10-CM

## 2018-12-30 MED ORDER — SITAGLIPTIN PHOSPHATE 100 MG PO TABS: 100.0000 mg | ORAL_TABLET | Freq: Every day | ORAL | 1 refills | Status: DC

## 2018-12-30 MED ORDER — METFORMIN HCL ER 500 MG PO TB24: 1000.0000 mg | ORAL_TABLET | Freq: Two times a day (BID) | ORAL | 1 refills | Status: DC

## 2018-12-30 NOTE — Telephone Encounter (Signed)
Copied from [place] [id number]. Topic: Quick Communication - Rx Refill/Question >> Dec 30, 2018  2:21 PM [name], [place] C wrote: Medication: metFORMIN (GLUCOPHAGE-XR) 500 MG 24 hr tablet and also sitaGLIPtin (JANUVIA) 100 MG tablet  Has the patient contacted their pharmacy? Yes   (Agent: If no, request that the patient contact the pharmacy for the refill.) (Agent: If yes, when and what did the pharmacy advise?)  Preferred Pharmacy (with phone number or street name): [facility] - [place], [place] - [address]  Agent: Please be advised that RX refills may take up to 3 business days. We ask that you follow-up with your pharmacy.

## 2018-12-30 NOTE — Telephone Encounter (Signed)
Labs reviewed and noted as stable per PCP- October. Requested Prescriptions  Pending Prescriptions Disp Refills  . metFORMIN (GLUCOPHAGE-XR) 500 MG 24 hr tablet 360 tablet 1    Sig: Take 2 tablets (1,000 mg total) by mouth 2 (two) times daily.     Endocrinology:  Diabetes - Biguanides Passed - 12/30/2018  2:27 PM      Passed - Cr in normal range and within 360 days    Creatinine, Ser  Date Value Ref Range Status  10/19/2018 0.78 0.40 - 1.20 mg/dL Final         Passed - HBA1C is between 0 and 7.9 and within 180 days    Hgb A1c MFr Bld  Date Value Ref Range Status  10/19/2018 6.9 (H) 4.6 - 6.5 % Final    Comment:    Glycemic Control Guidelines for People with Diabetes:Non Diabetic:  <6%Goal of Therapy: <7%Additional Action Suggested:  >8%          Passed - eGFR in normal range and within 360 days    GFR calc Af Amer  Date Value Ref Range Status  02/21/2009  >60 mL/min Final   >60        The eGFR has been calculated using the MDRD equation. This calculation has not been validated in all clinical situations. eGFR's persistently <60 mL/min signify possible Chronic Kidney Disease.   GFR calc non Af Amer  Date Value Ref Range Status  02/21/2009 >60 >60 mL/min Final   GFR  Date Value Ref Range Status  10/19/2018 77.42 >60.00 mL/min Final         Passed - Valid encounter within last 6 months    Recent Outpatient Visits          2 months ago Essential hypertension   Archivist at [place], DO   7 months ago DM (diabetes mellitus) type II uncontrolled with eye manifestation ([place])   Archivist at [place], [place], DO   1 year ago Essential hypertension   Archivist at [place], DO   1 year ago Sore throat   Archivist at [place], MD   1 year ago DM (diabetes mellitus) type II  uncontrolled, periph vascular disorder [facility])   Archivist at [place], DO      Future Appointments            In 3 months [place], DO [employer] at [employer], [place]   In 5 months [place], DO [employer] at [facility], [place]   In 5 months [place], [place], 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 sitaGLIPtin (JANUVIA) 100 MG tablet 90 tablet 1    Sig: Take 1 tablet (100 mg total) by mouth daily.     Endocrinology:  Diabetes - DPP-4 Inhibitors Passed - 12/30/2018  2:27 PM      Passed - HBA1C is between 0 and 7.9 and within 180 days    Hgb A1c MFr Bld  Date Value Ref Range Status  10/19/2018 6.9 (H) 4.6 - 6.5 % Final    Comment:    Glycemic Control Guidelines for People with Diabetes:Non Diabetic:  <6%Goal of Therapy: <  7%Additional Action Suggested:  >8%          Passed - Cr in normal range and within 360 days    Creatinine, Ser  Date Value Ref Range Status  10/19/2018 0.78 0.40 - 1.20 mg/dL Final         Passed - Valid encounter within last 6 months    Recent Outpatient Visits          2 months ago Essential hypertension   Archivist at [place], DO   7 months ago DM (diabetes mellitus) type II uncontrolled with eye manifestation ([place])   Archivist at [place], DO   1 year ago Essential hypertension   Archivist at [place], DO   1 year ago Sore throat   Archivist at [place], MD   1 year ago DM (diabetes mellitus) type II uncontrolled, periph vascular disorder [facility])   Archivist at [place], DO      Future Appointments            In 3 months [place], DO [employer] at [facility], [place]   In 5 months [place], [place] at [facility], [place]   In 5 months [place], [place], Research scientist (physical sciences) at [employer] [facility], [place]

## 2019-02-08 DIAGNOSIS — D225 Melanocytic nevi of trunk: Secondary | ICD-10-CM | POA: Diagnosis not present

## 2019-02-08 DIAGNOSIS — L821 Other seborrheic keratosis: Secondary | ICD-10-CM | POA: Diagnosis not present

## 2019-02-08 DIAGNOSIS — L814 Other melanin hyperpigmentation: Secondary | ICD-10-CM | POA: Diagnosis not present

## 2019-02-08 DIAGNOSIS — D1801 Hemangioma of skin and subcutaneous tissue: Secondary | ICD-10-CM | POA: Diagnosis not present

## 2019-03-21 ENCOUNTER — Other Ambulatory Visit: Payer: Self-pay | Admitting: Family Medicine

## 2019-03-21 DIAGNOSIS — IMO0002 Reserved for concepts with insufficient information to code with codable children: Secondary | ICD-10-CM

## 2019-03-21 DIAGNOSIS — E1139 Type 2 diabetes mellitus with other diabetic ophthalmic complication: Secondary | ICD-10-CM

## 2019-03-21 DIAGNOSIS — E1165 Type 2 diabetes mellitus with hyperglycemia: Principal | ICD-10-CM

## 2019-03-21 MED ORDER — GLUCOSE BLOOD VI STRP: ORAL_STRIP | 1 refills | Status: DC

## 2019-03-21 NOTE — Telephone Encounter (Signed)
Copied from [place]. Topic: Quick Communication - Rx Refill/Question >> Mar 21, 2019  9:50 AM Scherrie Gerlach wrote: Medication: glucose blood (ONE TOUCH ULTRA TEST) test strip 100 strips Pt test one time a day. [facility] - [place], [place] - [address] (Phone) [phone] (Fax)

## 2019-03-21 NOTE — Telephone Encounter (Signed)
Test strips sent to pharmacy.

## 2019-04-11 ENCOUNTER — Other Ambulatory Visit: Payer: Self-pay

## 2019-04-11 ENCOUNTER — Encounter: Payer: Self-pay | Admitting: Family Medicine

## 2019-04-11 ENCOUNTER — Ambulatory Visit: Payer: Medicare Other | Admitting: Family Medicine

## 2019-04-11 ENCOUNTER — Ambulatory Visit (INDEPENDENT_AMBULATORY_CARE_PROVIDER_SITE_OTHER): Payer: Medicare Other | Admitting: Family Medicine

## 2019-04-11 DIAGNOSIS — E785 Hyperlipidemia, unspecified: Secondary | ICD-10-CM

## 2019-04-11 DIAGNOSIS — E1169 Type 2 diabetes mellitus with other specified complication: Secondary | ICD-10-CM | POA: Diagnosis not present

## 2019-04-11 DIAGNOSIS — E1151 Type 2 diabetes mellitus with diabetic peripheral angiopathy without gangrene: Secondary | ICD-10-CM | POA: Diagnosis not present

## 2019-04-11 DIAGNOSIS — I10 Essential (primary) hypertension: Secondary | ICD-10-CM | POA: Diagnosis not present

## 2019-04-11 DIAGNOSIS — IMO0002 Reserved for concepts with insufficient information to code with codable children: Secondary | ICD-10-CM | POA: Insufficient documentation

## 2019-04-11 DIAGNOSIS — E1165 Type 2 diabetes mellitus with hyperglycemia: Secondary | ICD-10-CM | POA: Insufficient documentation

## 2019-04-11 NOTE — Progress Notes (Signed)
Virtual Visit via Video Note  I connected with Cynthia Burns on 04/11/19 at 10:45 AM EDT by a video enabled telemedicine application and verified that I am speaking with the correct person using two identifiers.   I discussed the limitations of evaluation and management by telemedicine and the availability of in person appointments. The patient expressed understanding and agreed to proceed.  History of Present Illness: Pt is home ---- we are f/u dm, bp and chol today  HYPERTENSION   Blood pressure range-good  Chest pain- no      Dyspnea- no Lightheadedness- no   Edema- no  Other side effects - no   Medication compliance: good Low salt diet- yes    DIABETES    Blood Sugar ranges-  108-126  Polyuria- no New Visual problems- no  Hypoglycemic symptoms- no  Other side effects-no Medication compliance - good Last eye exam- 04/2018    HYPERLIPIDEMIA  Medication compliance- good RUQ pain- no  Muscle aches- no Other side effects-no   Past Medical History:  Diagnosis Date  . Diabetes mellitus without complication ([place])   . 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 HYPERTENSION   . MITRAL VALVE PROLAPSE   . OSTEOPENIA   . Overweight(278.02)   . PHARYNGITIS, ACUTE     Current Outpatient Medications:  .  amoxicillin (AMOXIL) 500 MG capsule, Take 500 mg by mouth. For dental procedures, Disp: , Rfl:  .  aspirin EC 81 MG tablet, Take 1 tablet (81 mg total) by mouth daily., Disp: 90 tablet, Rfl: 3 .  Biotin (PA BIOTIN) 1000 MCG tablet, Take 1,000 mcg by mouth daily., Disp: , Rfl:  .  calcium carbonate (OSCAL) 1500 (600 Ca) MG TABS tablet, Take 600 mg of elemental calcium by mouth daily with breakfast., Disp: , Rfl:  .  Cyanocobalamin (VITAMIN B-12 PO), Take 1 tablet by mouth daily. , Disp: , Rfl:  .  furosemide (LASIX) 20 MG tablet, Take 0.5 tablets (10 mg total) by mouth daily., Disp: 45 tablet, Rfl: 3 .  glucose blood (ONE TOUCH ULTRA TEST) test strip, CHECK BLOOD SUGAR DAILY  E11.9, Disp: 100 each, Rfl: 1 .  Lancets (ONETOUCH ULTRASOFT) lancets, Use as instructed once daily.  Dx Code: E11.9, Disp: 100 each, Rfl: 1 .  losartan (COZAAR) 25 MG tablet, Take 1 tablet (25 mg total) by mouth daily., Disp: 90 tablet, Rfl: 3 .  meclizine (ANTIVERT) 25 MG tablet, Take 1 tablet (25 mg total) 3 (three) times daily as needed by mouth for dizziness., Disp: 90 tablet, Rfl: 3 .  metFORMIN (GLUCOPHAGE-XR) 500 MG 24 hr tablet, Take 2 tablets (1,000 mg total) by mouth 2 (two) times daily., Disp: 360 tablet, Rfl: 1 .  nebivolol (BYSTOLIC) 5 MG tablet, Take 1 tablet (5 mg total) by mouth daily., Disp: 90 tablet, Rfl: 3 .  rosuvastatin (CRESTOR) 20 MG tablet, Take 1 tablet (20 mg total) by mouth daily., Disp: 90 tablet, Rfl: 3 .  sitaGLIPtin (JANUVIA) 100 MG tablet, Take 1 tablet (100 mg total) by mouth daily., Disp: 90 tablet, Rfl: 1 .  VITAMIN D, CHOLECALCIFEROL, PO, Take 1 tablet by mouth daily. 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 DM (diabetes mellitus) type II uncontrolled, periph vascular disorder ([place]) hgba1c to be checked at [facility]  minimize simple carbs. Increase exercise as tolerated. Continue  current meds   2. Essential hypertension Well controlled, no changes to meds. Encouraged heart healthy diet such as the DASH diet and exercise as tolerated.   3. Hyperlipidemia associated with type 2 diabetes mellitus ([place]) Tolerating statin, encouraged heart healthy diet, avoid trans fats, minimize simple carbs and saturated fats. Increase exercise as tolerated  Follow Up Instructions:    I discussed the assessment and treatment plan with the patient. The patient was provided an opportunity to ask questions and all were answered. The patient agreed with the plan and demonstrated an understanding of the instructions.   The patient was advised to call back or seek an in-person evaluation  if the symptoms worsen or if the condition fails to improve as anticipated.  I provided 25 minutes of non-face-to-face time during this encounter.   Ann Held, DO

## 2019-04-18 ENCOUNTER — Ambulatory Visit: Payer: Medicare Other | Admitting: Family Medicine

## 2019-05-19 DIAGNOSIS — H25813 Combined forms of age-related cataract, bilateral: Secondary | ICD-10-CM | POA: Diagnosis not present

## 2019-05-19 DIAGNOSIS — H524 Presbyopia: Secondary | ICD-10-CM | POA: Diagnosis not present

## 2019-05-19 DIAGNOSIS — H43813 Vitreous degeneration, bilateral: Secondary | ICD-10-CM | POA: Diagnosis not present

## 2019-05-19 DIAGNOSIS — E119 Type 2 diabetes mellitus without complications: Secondary | ICD-10-CM | POA: Diagnosis not present

## 2019-05-19 LAB — HM DIABETES EYE EXAM

## 2019-05-31 ENCOUNTER — Encounter: Payer: Medicare Other | Admitting: Family Medicine

## 2019-05-31 ENCOUNTER — Ambulatory Visit: Payer: Medicare Other | Admitting: *Deleted

## 2019-06-03 ENCOUNTER — Encounter: Payer: Self-pay | Admitting: *Deleted

## 2019-06-14 ENCOUNTER — Other Ambulatory Visit: Payer: Self-pay | Admitting: Family Medicine

## 2019-06-14 ENCOUNTER — Telehealth: Payer: Self-pay

## 2019-06-14 DIAGNOSIS — E1151 Type 2 diabetes mellitus with diabetic peripheral angiopathy without gangrene: Secondary | ICD-10-CM

## 2019-06-14 DIAGNOSIS — E1139 Type 2 diabetes mellitus with other diabetic ophthalmic complication: Secondary | ICD-10-CM

## 2019-06-14 DIAGNOSIS — IMO0002 Reserved for concepts with insufficient information to code with codable children: Secondary | ICD-10-CM

## 2019-06-14 DIAGNOSIS — E1165 Type 2 diabetes mellitus with hyperglycemia: Secondary | ICD-10-CM

## 2019-06-14 DIAGNOSIS — E119 Type 2 diabetes mellitus without complications: Secondary | ICD-10-CM

## 2019-06-14 MED ORDER — GLUCOSE BLOOD VI STRP: ORAL_STRIP | 12 refills | Status: DC

## 2019-06-14 MED ORDER — METFORMIN HCL 500 MG PO TABS: 500.0000 mg | ORAL_TABLET | Freq: Two times a day (BID) | ORAL | 1 refills | Status: DC

## 2019-06-14 MED ORDER — SITAGLIPTIN PHOSPHATE 100 MG PO TABS: 100.0000 mg | ORAL_TABLET | Freq: Every day | ORAL | 1 refills | Status: DC

## 2019-06-14 MED ORDER — ONETOUCH ULTRASOFT LANCETS MISC: 12 refills | Status: DC

## 2019-06-14 NOTE — Telephone Encounter (Signed)
Medication: sitaGLIPtin (JANUVIA) 100 MG tablet, OneTouch Delica Plus lancing device, one touch delica test strips  Has the patient contacted their pharmacy? Yes.   (Agent: If no, request that the patient contact the pharmacy for the refill.) (Agent: If yes, when and what did the pharmacy advise?)  Preferred Pharmacy (with phone number or street name): [facility] - [place], [place] - [address] (Phone) [phone] (Fax)    Agent: Please be advised that RX refills may take up to 3 business days. We ask that you follow-up with your pharmacy.

## 2019-06-14 NOTE — Telephone Encounter (Signed)
Metformin bid sent

## 2019-06-14 NOTE — Telephone Encounter (Signed)
Copied from [place] [id number]. Topic: General - Other >> Jun 14, 2019 11:34 AM Leward Quan A wrote: Reason for CRM: Patient called to say that her Metformin have been recalled asking for an Rx for something new also per patient she was informed to stop taking the Metformin and wait for the Dr to send Rx for new medications to [id number] [address], [place] (Phone) [phone] (Fax)

## 2019-06-14 NOTE — Addendum Note (Signed)
Addended byDamita Dunnings D on: 06/14/2019 02:19 PM   Modules accepted: Orders

## 2019-06-14 NOTE — Telephone Encounter (Signed)
Refills sent

## 2019-06-15 MED ORDER — METFORMIN HCL 500 MG PO TABS: 500.0000 mg | ORAL_TABLET | Freq: Two times a day (BID) | ORAL | 1 refills | Status: DC

## 2019-06-15 NOTE — Addendum Note (Signed)
Addended byDamita Dunnings D on: 06/15/2019 08:19 AM   Modules accepted: Orders

## 2019-06-15 NOTE — Telephone Encounter (Signed)
Spoke w/ Pt- informed of med change. Pt verbalized understanding.

## 2019-06-16 ENCOUNTER — Telehealth: Payer: Self-pay

## 2019-06-16 MED ORDER — METFORMIN HCL 1000 MG PO TABS: 1000.0000 mg | ORAL_TABLET | Freq: Two times a day (BID) | ORAL | 3 refills | Status: DC

## 2019-06-16 NOTE — Telephone Encounter (Signed)
Copied from [place] [id number]. Topic: General - Other >> Jun 16, 2019  9:18 AM Oneta Rack wrote: Patient states metFORMIN extended release is currently on recall and new Rx was sent to pharmacy but the frequency should be  4 tablets daily, patient in need of clarity and new Rx sent to pharmacy   [id number] [address], [place] [phone] (Phone) [phone] (Fax)

## 2019-06-16 NOTE — Telephone Encounter (Signed)
rx should have been 1000mg  bid of metformin---  She can take 2 bid of what she has and please send 1000mg   #60 into pharm with 3 refills

## 2019-06-16 NOTE — Telephone Encounter (Signed)
Please advise 

## 2019-06-16 NOTE — Telephone Encounter (Signed)
Metformin 1000 mg bid sent to pharmacy

## 2019-06-17 MED ORDER — METFORMIN HCL 1000 MG PO TABS: 1000.0000 mg | ORAL_TABLET | Freq: Two times a day (BID) | ORAL | 3 refills | Status: DC

## 2019-06-22 NOTE — Progress Notes (Signed)
Subjective:   Janda Cargo is a 73 y.o. female who presents for Medicare Annual (Subsequent) preventive examination.  Pt still does hair for a living.  Review of Systems: No ROS.  See social history for additional risk factors. Cardiac Risk Factors include: advanced age (>58mn, >>31women);diabetes mellitus;dyslipidemia;hypertension Sleep patterns: no issues Home Safety/Smoke Alarms: Feels safe in home. Smoke alarms in place.  Lives in 2 story home with husband.   Female:       Mammo- 10/13/18      Dexa scan-  Pt declines      CCS- due 2024     Objective:     Vitals: BP 134/76 (BP Location: Left Arm, Patient Position: Sitting, Cuff Size: Normal)   Pulse 66   Ht _0  (1.575 m)   Wt 136 lb 3.2 oz (61.8 kg)   SpO2 97%   BMI 24.91 kg/m   Body mass index is 24.91 kg/m.  Advanced Directives 06/23/2019 05/25/2018 09/25/2016  Does Patient Have a Medical Advance Directive? Yes Yes Yes  Type of [place]amed[place]of [place] will [place] will  Does patient want to make changes to medical advance directive? No - Patient declined - No - Patient declined  Copy of [place] Chart? No - copy requested No - copy requested No - copy requested    Tobacco Social History   Tobacco Use  Smoking Status Never Smoker  Smokeless Tobacco Never Used     Counseling given: Not Answered   Clinical Intake Pain : No/denies pain    Past Medical History:  Diagnosis Date  . Diabetes mellitus without complication ([place]   . 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 HYPERTENSION   . MITRAL VALVE PROLAPSE   . OSTEOPENIA   . Overweight(278.02)   . PHARYNGITIS, ACUTE    Past Surgical History:  Procedure Laterality Date  . ABDOMINAL HYSTERECTOMY    . CHOLECYSTECTOMY     Family History  Problem Relation Age of Onset  . Emphysema Father   . Diabetes Father   . Heart  disease Mother        Had a stent  . Hypertension Other   . Colon cancer Neg Hx   . BRCA 1/2 Neg Hx   . Breast cancer Neg Hx    Social History   Socioeconomic History  . Marital status: Married    Spouse name: Not on file  . Number of children: Not on file  . Years of education: Not on file  . Highest education level: Not on file  Occupational History  . Not on file  Social Needs  . Financial resource strain: Not on file  . Food insecurity    Worry: Not on file    Inability: Not on file  . Transportation needs    Medical: Not on file    Non-medical: Not on file  Tobacco Use  . Smoking status: Never Smoker  . Smokeless tobacco: Never Used  Substance and Sexual Activity  . Alcohol use: No    Alcohol/week: 0.0 standard drinks  . Drug use: No  . Sexual activity: Not Currently  Lifestyle  . Physical activity    Days per week: Not on file    Minutes per session: Not on file  . Stress: Not on file  Relationships  . Social connections    Talks on phone: Not on file  Gets together: Not on file    Attends religious service: Not on file    Active member of club or organization: Not on file    Attends meetings of clubs or organizations: Not on file    Relationship status: Not on file  Other Topics Concern  . Not on file  Social History Narrative  . Not on file    Outpatient Encounter Medications as of 06/23/2019  Medication Sig  . aspirin EC 81 MG tablet Take 1 tablet (81 mg total) by mouth daily.  . Biotin (PA BIOTIN) 1000 MCG tablet Take 1,000 mcg by mouth daily.  . calcium carbonate (OSCAL) 1500 (600 Ca) MG TABS tablet Take 600 mg of elemental calcium by mouth daily with breakfast.  . Cyanocobalamin (VITAMIN B-12 PO) Take 1 tablet by mouth daily.   . furosemide (LASIX) 20 MG tablet Take 0.5 tablets (10 mg total) by mouth daily.  Marland Kitchen glucose blood (ONE TOUCH ULTRA TEST) test strip CHECK BLOOD SUGAR DAILY E11.9  . Lancets (ONETOUCH ULTRASOFT) lancets Use as instructed  once daily.  Dx Code: E11.9  . losartan (COZAAR) 25 MG tablet Take 1 tablet (25 mg total) by mouth daily.  . metFORMIN (GLUCOPHAGE) 1000 MG tablet Take 1 tablet (1,000 mg total) by mouth 2 (two) times daily with a meal.  . nebivolol (BYSTOLIC) 5 MG tablet Take 1 tablet (5 mg total) by mouth daily.  . rosuvastatin (CRESTOR) 20 MG tablet Take 1 tablet (20 mg total) by mouth daily.  . sitaGLIPtin (JANUVIA) 100 MG tablet Take 1 tablet (100 mg total) by mouth daily.  Marland Kitchen VITAMIN D, CHOLECALCIFEROL, PO Take 1 tablet by mouth daily.   Marland Kitchen amoxicillin (AMOXIL) 500 MG capsule Take 500 mg by mouth. For dental procedures  . meclizine (ANTIVERT) 25 MG tablet Take 1 tablet (25 mg total) 3 (three) times daily as needed by mouth for dizziness. (Patient not taking: Reported on 06/23/2019)   No facility-administered encounter medications on file as of 06/23/2019.     Activities of Daily Living In your present state of health, do you have any difficulty performing the following activities: 06/23/2019  Hearing? N  Vision? N  Difficulty concentrating or making decisions? N  Walking or climbing stairs? N  Dressing or bathing? N  Doing errands, shopping? N  Preparing Food and eating ? N  Using the Toilet? N  In the past six months, have you accidently leaked urine? N  Do you have problems with loss of bowel control? N  Managing your Medications? N  Managing your Finances? N  Housekeeping or managing your Housekeeping? N  Some recent data might be hidden    Patient Care Team: Carollee Herter, Alferd Apa, DO as PCP - General Josue Hector, MD as PCP - Cardiology (Cardiology) Shon Hough, MD as Consulting Physician (Ophthalmology) Lafayette Dragon, MD (Inactive) (Gastroenterology) Mcarthur Rossetti, MD as Consulting Physician (Orthopedic Surgery)    Assessment:   This is a routine wellness examination for Sanvika. Physical assessment deferred to PCP.  Exercise Activities and Dietary recommendations Current  Exercise Habits: The patient does not participate in regular exercise at present, Exercise limited by: None identified Diet (meal preparation, eat out, water intake, caffeinated beverages, dairy products, fruits and vegetables): well balanced     Goals    . Weight (lb) < 135 lb (61.2 kg)       Fall Risk Fall Risk  06/23/2019 05/25/2018 11/19/2017 09/25/2016 09/13/2015  Falls in the past year? 0 No  No No No  Number falls in past yr: - - - - -  Injury with Fall? - - - - -  Risk for fall due to : - - - - -  Risk for fall due to: Comment - - - - -    Depression Screen PHQ 2/9 Scores 06/23/2019 05/25/2018 11/19/2017 09/25/2016  PHQ - 2 Score 0 0 0 0     Cognitive Function Ad8 score reviewed for issues:  Issues making decisions:no  Less interest in hobbies / activities:no  Repeats questions, stories (family complaining):no  Trouble using ordinary gadgets (microwave, computer, phone):no  Forgets the month or year: no  Mismanaging finances: no  Remembering appts:no  Daily problems with thinking and/or memory:no Ad8 score is=0   MMSE - Mini Mental State Exam 09/25/2016  Orientation to time 5  Orientation to Place 5  Registration 3  Attention/ Calculation 5  Recall 3  Language- name 2 objects 2  Language- repeat 1  Language- follow 3 step command 3  Language- read & follow direction 1  Write a sentence 1  Copy design 1  Total score 30        Immunization History  Administered Date(s) Administered  . Influenza Whole 10/20/2007  . Influenza, High Dose Seasonal PF 09/01/2016, 09/18/2018  . Influenza,inj,Quad PF,6+ Mos 09/05/2013, 09/05/2014, 09/13/2015  . Influenza-Unspecified 09/21/2017  . Pneumococcal Conjugate-13 02/27/2014  . Pneumococcal Polysaccharide-23 09/06/2011  . Zoster 09/14/2013  . Zoster Recombinat (Shingrix) 04/12/2017, 06/13/2017     Screening Tests Health Maintenance  Topic Date Due  . HEMOGLOBIN A1C  04/20/2019  . INFLUENZA VACCINE  07/23/2019   . MAMMOGRAM  10/14/2019  . FOOT EXAM  10/20/2019  . OPHTHALMOLOGY EXAM  05/18/2020  . COLONOSCOPY  11/09/2023  . TETANUS/TDAP  09/13/2024  . DEXA SCAN  Completed  . Hepatitis C Screening  Completed  . PNA vac Low Risk Adult  Completed     Plan:    Please schedule your next medicare wellness visit with me in 1 yr.  Continue to eat heart healthy diet (full of fruits, vegetables, whole grains, lean protein, water--limit salt, fat, and sugar intake) and increase physical activity as tolerated.  Continue doing brain stimulating activities (puzzles, reading, adult coloring books, staying active) to keep memory sharp.   Bring a copy of your living will and/or healthcare power of attorney to your next office visit.    I have personally reviewed and noted the following in the patient's chart:   . Medical and social history . Use of alcohol, tobacco or illicit drugs  . Current medications and supplements . Functional ability and status . Nutritional status . Physical activity . Advanced directives . List of other physicians . Hospitalizations, surgeries, and ER visits in previous 12 months . Vitals . Screenings to include cognitive, depression, and falls . Referrals and appointments  In addition, I have reviewed and discussed with patient certain preventive protocols, quality metrics, and best practice recommendations. A written personalized care plan for preventive services as well as general preventive health recommendations were provided to patient.     Naaman Plummer [place], [place]  06/23/2019

## 2019-06-23 ENCOUNTER — Ambulatory Visit (INDEPENDENT_AMBULATORY_CARE_PROVIDER_SITE_OTHER): Payer: Medicare Other | Admitting: Family Medicine

## 2019-06-23 ENCOUNTER — Encounter: Payer: Self-pay | Admitting: Family Medicine

## 2019-06-23 ENCOUNTER — Other Ambulatory Visit: Payer: Self-pay

## 2019-06-23 ENCOUNTER — Encounter: Payer: Self-pay | Admitting: *Deleted

## 2019-06-23 ENCOUNTER — Ambulatory Visit (INDEPENDENT_AMBULATORY_CARE_PROVIDER_SITE_OTHER): Payer: Medicare Other | Admitting: *Deleted

## 2019-06-23 VITALS — BP 134/76 | HR 66 | Ht 62.0 in | Wt 136.2 lb

## 2019-06-23 DIAGNOSIS — E785 Hyperlipidemia, unspecified: Secondary | ICD-10-CM

## 2019-06-23 DIAGNOSIS — E1169 Type 2 diabetes mellitus with other specified complication: Secondary | ICD-10-CM

## 2019-06-23 DIAGNOSIS — Z0000 Encounter for general adult medical examination without abnormal findings: Secondary | ICD-10-CM | POA: Diagnosis not present

## 2019-06-23 DIAGNOSIS — E1165 Type 2 diabetes mellitus with hyperglycemia: Secondary | ICD-10-CM

## 2019-06-23 DIAGNOSIS — I10 Essential (primary) hypertension: Secondary | ICD-10-CM

## 2019-06-23 DIAGNOSIS — E1151 Type 2 diabetes mellitus with diabetic peripheral angiopathy without gangrene: Secondary | ICD-10-CM | POA: Diagnosis not present

## 2019-06-23 DIAGNOSIS — IMO0002 Reserved for concepts with insufficient information to code with codable children: Secondary | ICD-10-CM

## 2019-06-23 LAB — COMPREHENSIVE METABOLIC PANEL
ALT: 13 U/L (ref 0–35)
AST: 16 U/L (ref 0–37)
Albumin: 4.6 g/dL (ref 3.5–5.2)
Alkaline Phosphatase: 35 U/L — ABNORMAL LOW (ref 39–117)
BUN: 20 mg/dL (ref 6–23)
CO2: 26 mEq/L (ref 19–32)
Calcium: 9.2 mg/dL (ref 8.4–10.5)
Chloride: 102 mEq/L (ref 96–112)
Creatinine, Ser: 0.88 mg/dL (ref 0.40–1.20)
GFR: 63.25 mL/min (ref >60.00)
Glucose, Bld: 147 mg/dL — ABNORMAL HIGH (ref 70–99)
Potassium: 4.1 mEq/L (ref 3.5–5.1)
Sodium: 139 mEq/L (ref 135–145)
Total Bilirubin: 0.4 mg/dL (ref 0.2–1.2)
Total Protein: 6.8 g/dL (ref 6.0–8.3)

## 2019-06-23 LAB — LIPID PANEL
Cholesterol: 112 mg/dL (ref 0–200)
HDL: 39.6 mg/dL (ref >39.00)
LDL Cholesterol: 39 mg/dL (ref 0–99)
NonHDL: 72.21
Total CHOL/HDL Ratio: 3
Triglycerides: 166 mg/dL — ABNORMAL HIGH (ref 0.0–149.0)
VLDL: 33.2 mg/dL (ref 0.0–40.0)

## 2019-06-23 LAB — MICROALBUMIN / CREATININE URINE RATIO
Creatinine,U: 239.6 mg/dL
Microalb Creat Ratio: 0.8 mg/g (ref 0.0–30.0)
Microalb, Ur: 1.9 mg/dL (ref 0.0–1.9)

## 2019-06-23 LAB — HEMOGLOBIN A1C: Hgb A1c MFr Bld: 6.8 % — ABNORMAL HIGH (ref 4.6–6.5)

## 2019-06-23 NOTE — Progress Notes (Signed)
[name], female    DOB: August 23, 1946  Age: 73 y.o. MRN: [id number]    Subjective:  Subjective  HPI Cynthia Burns presents for f/u bp , chol and dm.  No complaints.    HPI HYPERTENSION   Blood pressure range-good per pt   Chest pain- no      Dyspnea- no Lightheadedness- no   Edema- no  Other side effects - no   Medication compliance: good Low salt diet- yes    DIABETES    Blood Sugar ranges-great per pt   Polyuria- no New Visual problems- no  Hypoglycemic symptoms- no  Other side effects-no Medication compliance - good Last eye exam- done this year Foot exam- today   HYPERLIPIDEMIA  Medication - recent RUQ pain- no  Muscle aches- no Other side effects-no     Review of Systems  Constitutional: Negative for appetite change, chills, diaphoresis, fatigue, fever and unexpected weight change.  HENT: Negative for congestion and hearing loss.   Eyes: Negative for pain, discharge, redness and visual disturbance.  Respiratory: Negative for cough, chest tightness, shortness of breath and wheezing.   Cardiovascular: Negative for chest pain, palpitations and leg swelling.  Gastrointestinal: Negative for abdominal pain, blood in stool, constipation, diarrhea, nausea and vomiting.  Endocrine: Negative for cold intolerance, heat intolerance, polydipsia, polyphagia and polyuria.  Genitourinary: Negative for difficulty urinating, dysuria, frequency, hematuria and urgency.  Musculoskeletal: Negative for back pain and myalgias.  Skin: Negative for rash.  Allergic/Immunologic: Negative for environmental allergies.  Neurological: Negative for dizziness, weakness, light-headedness, numbness and headaches.  Hematological: Does not bruise/bleed easily.  Psychiatric/Behavioral: Negative for suicidal ideas. The patient is not nervous/anxious.     History Past Medical History:  Diagnosis Date  . Diabetes mellitus without complication ([place])   . Heart aneurysm    echo  done every year  . HYPERTENSION   . MITRAL VALVE PROLAPSE   . OSTEOPENIA   . Overweight(278.02)   . PHARYNGITIS, ACUTE     She has a past surgical history that includes Abdominal hysterectomy and Cholecystectomy.   Her family history includes Diabetes in her father; Emphysema in her father; Heart disease in her mother; Hypertension in an other family member.She reports that she has never smoked. She has never used smokeless tobacco. She reports that she does not drink alcohol or use drugs.  Current Outpatient Medications on File Prior to Visit  Medication Sig Dispense Refill  . amoxicillin (AMOXIL) 500 MG capsule Take 500 mg by mouth. For dental procedures    . aspirin EC 81 MG tablet Take 1 tablet (81 mg total) by mouth daily. 90 tablet 3  . Biotin (PA BIOTIN) 1000 MCG tablet Take 1,000 mcg by mouth daily.    . calcium carbonate (OSCAL) 1500 (600 Ca) MG TABS tablet Take 600 mg of elemental calcium by mouth daily with breakfast.    . Cyanocobalamin (VITAMIN B-12 PO) Take 1 tablet by mouth daily.     . furosemide (LASIX) 20 MG tablet Take 0.5 tablets (10 mg total) by mouth daily. 45 tablet 3  . glucose blood (ONE TOUCH ULTRA TEST) test strip CHECK BLOOD SUGAR DAILY E11.9 100 each 12  . Lancets (ONETOUCH ULTRASOFT) lancets Use as instructed once daily.  Dx Code: E11.9 100 each 12  . losartan (COZAAR) 25 MG tablet Take 1 tablet (25 mg total) by mouth daily. 90 tablet 3  . meclizine (ANTIVERT) 25 MG tablet Take 1 tablet (25 mg total) 3 (three) times  daily as needed by mouth for dizziness. (Patient not taking: Reported on 06/23/2019) 90 tablet 3  . metFORMIN (GLUCOPHAGE) 1000 MG tablet Take 1 tablet (1,000 mg total) by mouth 2 (two) times daily with a meal. 60 tablet 3  . nebivolol (BYSTOLIC) 5 MG tablet Take 1 tablet (5 mg total) by mouth daily. 90 tablet 3  . rosuvastatin (CRESTOR) 20 MG tablet Take 1 tablet (20 mg total) by mouth daily. 90 tablet 3  . sitaGLIPtin (JANUVIA) 100 MG tablet Take 1  tablet (100 mg total) by mouth daily. 90 tablet 1  . VITAMIN D, CHOLECALCIFEROL, PO Take 1 tablet by mouth daily.      No current facility-administered medications on file prior to visit.      Objective:  Objective  Physical Exam Vitals signs and nursing note reviewed.  Constitutional:      Appearance: She is well-developed.  HENT:     Head: Normocephalic and atraumatic.  Eyes:     Conjunctiva/sclera: Conjunctivae normal.  Neck:     Musculoskeletal: Normal range of motion and neck supple.     Thyroid: No thyromegaly.     Vascular: No carotid bruit or JVD.  Cardiovascular:     Rate and Rhythm: Normal rate and regular rhythm.     Heart sounds: Normal heart sounds. No murmur.  Pulmonary:     Effort: Pulmonary effort is normal. No respiratory distress.     Breath sounds: Normal breath sounds. No wheezing or rales.  Chest:     Chest wall: No tenderness.  Neurological:     Mental Status: She is alert and oriented to person, place, and time.    Diabetic Foot Exam - Simple   Simple Foot Form Diabetic Foot exam was performed with the following findings: Yes 06/23/2019  9:20 AM  Visual Inspection No deformities, no ulcerations, no other skin breakdown bilaterally: Yes Sensation Testing Intact to touch and monofilament testing bilaterally: Yes Pulse Check Posterior Tibialis and Dorsalis pulse intact bilaterally: Yes Comments     There were no vitals taken for this visit. Wt Readings from Last 3 Encounters:  06/23/19 136 lb 3.2 oz (61.8 kg)  11/23/18 142 lb 12.8 oz (64.8 kg)  10/19/18 144 lb (65.3 kg)     Lab Results  Component Value Date   WBC 6.0 09/25/2016   HGB 13.0 09/25/2016   HCT 38.8 09/25/2016   PLT 257.0 09/25/2016   GLUCOSE 136 (H) 10/19/2018   CHOL 117 10/19/2018   TRIG 145.0 10/19/2018   HDL 42.30 10/19/2018   LDLDIRECT 53.0 05/25/2018   LDLCALC 46 10/19/2018   ALT 13 10/19/2018   AST 15 10/19/2018   NA 140 10/19/2018   K 4.4 10/19/2018   CL 104  10/19/2018   CREATININE 0.78 10/19/2018   BUN 15 10/19/2018   CO2 29 10/19/2018   HGBA1C 6.9 (H) 10/19/2018   MICROALBUR 0.8 10/19/2018    Mm 3d Screen Breast Bilateral  Result Date: 10/13/2018 CLINICAL DATA:  Screening. EXAM: DIGITAL SCREENING BILATERAL MAMMOGRAM WITH TOMO AND CAD COMPARISON:  Previous exam(s). ACR Breast Density Category b: There are scattered areas of fibroglandular density. FINDINGS: There are no findings suspicious for malignancy. Images were processed with CAD. IMPRESSION: No mammographic evidence of malignancy. A result letter of this screening mammogram will be mailed directly to the patient. RECOMMENDATION: Screening mammogram in one year. (Code:SM-B-01Y) BI-RADS CATEGORY  1: Negative. Electronically Signed   By: Claudie Revering M.D.   On: 10/13/2018 13:14  Assessment & Plan:  Plan  I am having Carlene Coria maintain her aspirin EC, Cyanocobalamin (VITAMIN B-12 PO), (VITAMIN D, CHOLECALCIFEROL, PO), Biotin, meclizine, amoxicillin, calcium carbonate, furosemide, losartan, nebivolol, rosuvastatin, sitaGLIPtin, onetouch ultrasoft, glucose blood, and metFORMIN.  No orders of the defined types were placed in this encounter.   Problem List Items Addressed This Visit      Unprioritized   DM (diabetes mellitus) type II uncontrolled, periph vascular disorder ([place])    Check labs today      Essential hypertension    Well controlled, no changes to meds. Encouraged heart healthy diet such as the DASH diet and exercise as tolerated.       Relevant Orders   Comprehensive metabolic panel   Hyperlipidemia LDL goal <70    Tolerating statin, encouraged heart healthy diet, avoid trans fats, minimize simple carbs and saturated fats. Increase exercise as tolerated       Other Visit Diagnoses    Type 2 diabetes mellitus with hyperglycemia, without long-term current use of insulin ([place])    -  Primary   Relevant Orders   Hemoglobin A1c   Comprehensive metabolic panel    Microalbumin / creatinine urine ratio   Hyperlipidemia associated with type 2 diabetes mellitus ([place])       Relevant Orders   Lipid panel   Comprehensive metabolic panel      Follow-up: Return in about 6 months (around 12/24/2019), or if symptoms worsen or fail to improve, for hypertension, hyperlipidemia, diabetes II.  Ann Held, DO

## 2019-06-23 NOTE — Assessment & Plan Note (Signed)
Well controlled, no changes to meds. Encouraged heart healthy diet such as the DASH diet and exercise as tolerated.  °

## 2019-06-23 NOTE — Assessment & Plan Note (Signed)
Tolerating statin, encouraged heart healthy diet, avoid trans fats, minimize simple carbs and saturated fats. Increase exercise as tolerated 

## 2019-06-23 NOTE — Patient Instructions (Signed)
Please schedule your next medicare wellness visit with me in 1 yr.  Continue to eat heart healthy diet (full of fruits, vegetables, whole grains, lean protein, water--limit salt, fat, and sugar intake) and increase physical activity as tolerated.  Continue doing brain stimulating activities (puzzles, reading, adult coloring books, staying active) to keep memory sharp.   Bring a copy of your living will and/or healthcare power of attorney to your next office visit.    Cynthia Burns , Thank you for taking time to come for your Medicare Wellness Visit. I appreciate your ongoing commitment to your health goals. Please review the following plan we discussed and let me know if I can assist you in the future.   These are the goals we discussed: Goals    . Weight (lb) < 135 lb (61.2 kg)       This is a list of the screening recommended for you and due dates:  Health Maintenance  Topic Date Due  . Hemoglobin A1C  04/20/2019  . Flu Shot  07/23/2019  . Mammogram  10/14/2019  . Complete foot exam   10/20/2019  . Eye exam for diabetics  05/18/2020  . Colon Cancer Screening  11/09/2023  . Tetanus Vaccine  09/13/2024  . DEXA scan (bone density measurement)  Completed  .  Hepatitis C: One time screening is recommended by Center for Disease Control  (CDC) for  adults born from [birth year] through 1964.   Completed  . Pneumonia vaccines  Completed    Health Maintenance After Age 9 After age 90, you are at a higher risk for certain long-term diseases and infections as well as injuries from falls. Falls are a major cause of broken bones and head injuries in people who are older than age 52. Getting regular preventive care can help to keep you healthy and well. Preventive care includes getting regular testing and making lifestyle changes as recommended by your health care provider. Talk with your health care provider about:  Which screenings and tests you should have. A screening is a test that checks for a  disease when you have no symptoms.  A diet and exercise plan that is right for you. What should I know about screenings and tests to prevent falls? Screening and testing are the best ways to find a health problem early. Early diagnosis and treatment give you the best chance of managing medical conditions that are common after age 6. Certain conditions and lifestyle choices may make you more likely to have a fall. Your health care provider may recommend:  Regular vision checks. Poor vision and conditions such as cataracts can make you more likely to have a fall. If you wear glasses, make sure to get your prescription updated if your vision changes.  Medicine review. Work with your health care provider to regularly review all of the medicines you are taking, including over-the-counter medicines. Ask your health care provider about any side effects that may make you more likely to have a fall. Tell your health care provider if any medicines that you take make you feel dizzy or sleepy.  Osteoporosis screening. Osteoporosis is a condition that causes the bones to get weaker. This can make the bones weak and cause them to break more easily.  Blood pressure screening. Blood pressure changes and medicines to control blood pressure can make you feel dizzy.  Strength and balance checks. Your health care provider may recommend certain tests to check your strength and balance while standing, walking, or changing  positions.  Foot health exam. Foot pain and numbness, as well as not wearing proper footwear, can make you more likely to have a fall.  Depression screening. You may be more likely to have a fall if you have a fear of falling, feel emotionally low, or feel unable to do activities that you used to do.  Alcohol use screening. Using too much alcohol can affect your balance and may make you more likely to have a fall. What actions can I take to lower my risk of falls? General instructions  Talk with  your health care provider about your risks for falling. Tell your health care provider if: ? You fall. Be sure to tell your health care provider about all falls, even ones that seem minor. ? You feel dizzy, sleepy, or off-balance.  Take over-the-counter and prescription medicines only as told by your health care provider. These include any supplements.  Eat a healthy diet and maintain a healthy weight. A healthy diet includes low-fat dairy products, low-fat (lean) meats, and fiber from whole grains, beans, and lots of fruits and vegetables. Home safety  Remove any tripping hazards, such as rugs, cords, and clutter.  Install safety equipment such as grab bars in bathrooms and safety rails on stairs.  Keep rooms and walkways well-lit. Activity   Follow a regular exercise program to stay fit. This will help you maintain your balance. Ask your health care provider what types of exercise are appropriate for you.  If you need a cane or walker, use it as recommended by your health care provider.  Wear supportive shoes that have nonskid soles. Lifestyle  Do not drink alcohol if your health care provider tells you not to drink.  If you drink alcohol, limit how much you have: ? 0-1 drink a day for women. ? 0-2 drinks a day for men.  Be aware of how much alcohol is in your drink. In the [place] one drink equals one typical bottle of beer (12 oz), one-half glass of wine (5 oz), or one shot of hard liquor (1 oz).  Do not use any products that contain nicotine or tobacco, such as cigarettes and e-cigarettes. If you need help quitting, ask your health care provider. Summary  Having a healthy lifestyle and getting preventive care can help to protect your health and wellness after age 74.  Screening and testing are the best way to find a health problem early and help you avoid having a fall. Early diagnosis and treatment give you the best chance for managing medical conditions that are more common  for people who are older than age 31.  Falls are a major cause of broken bones and head injuries in people who are older than age 40. Take precautions to prevent a fall at home.  Work with your health care provider to learn what changes you can make to improve your health and wellness and to prevent falls. This information is not intended to replace advice given to you by your health care provider. Make sure you discuss any questions you have with your health care provider. Document Released: 10/21/2017 Document Revised: 03/31/2019 Document Reviewed: 10/21/2017 Elsevier Patient Education  2020 [employer].

## 2019-06-23 NOTE — Patient Instructions (Signed)
Preventive Care 72 Years and Older, Female Preventive care refers to lifestyle choices and visits with your health care provider that can promote health and wellness. This includes:  A yearly physical exam. This is also called an annual well check.  Regular dental and eye exams.  Immunizations.  Screening for certain conditions.  Healthy lifestyle choices, such as diet and exercise. What can I expect for my preventive care visit? Physical exam Your health care provider will check:  Height and weight. These may be used to calculate body mass index (BMI), which is a measurement that tells if you are at a healthy weight.  Heart rate and blood pressure.  Your skin for abnormal spots. Counseling Your health care provider may ask you questions about:  Alcohol, tobacco, and drug use.  Emotional well-being.  Home and relationship well-being.  Sexual activity.  Eating habits.  History of falls.  Memory and ability to understand (cognition).  Work and work Statistician.  Pregnancy and menstrual history. What immunizations do I need?  Influenza (flu) vaccine  This is recommended every year. Tetanus, diphtheria, and pertussis (Tdap) vaccine  You may need a Td booster every 10 years. Varicella (chickenpox) vaccine  You may need this vaccine if you have not already been vaccinated. Zoster (shingles) vaccine  You may need this after age 72. Pneumococcal conjugate (PCV13) vaccine  One dose is recommended after age 72. Pneumococcal polysaccharide (PPSV23) vaccine  One dose is recommended after age 72. Measles, mumps, and rubella (MMR) vaccine  You may need at least one dose of MMR if you were born in 1957 or later. You may also need a second dose. Meningococcal conjugate (MenACWY) vaccine  You may need this if you have certain conditions. Hepatitis A vaccine  You may need this if you have certain conditions or if you travel or work in places where you may be exposed  to hepatitis A. Hepatitis B vaccine  You may need this if you have certain conditions or if you travel or work in places where you may be exposed to hepatitis B. Haemophilus influenzae type b (Hib) vaccine  You may need this if you have certain conditions. You may receive vaccines as individual doses or as more than one vaccine together in one shot (combination vaccines). Talk with your health care provider about the risks and benefits of combination vaccines. What tests do I need? Blood tests  Lipid and cholesterol levels. These may be checked every 5 years, or more frequently depending on your overall health.  Hepatitis C test.  Hepatitis B test. Screening  Lung cancer screening. You may have this screening every year starting at age 39 if you have a 30-pack-year history of smoking and currently smoke or have quit within the past 15 years.  Colorectal cancer screening. All adults should have this screening starting at age 36 and continuing until age 15. Your health care provider may recommend screening at age 23 if you are at increased risk. You will have tests every 1-10 years, depending on your results and the type of screening test.  Diabetes screening. This is done by checking your blood sugar (glucose) after you have not eaten for a while (fasting). You may have this done every 1-3 years.  Mammogram. This may be done every 1-2 years. Talk with your health care provider about how often you should have regular mammograms.  BRCA-related cancer screening. This may be done if you have a family history of breast, ovarian, tubal, or peritoneal cancers.  Other tests  Sexually transmitted disease (STD) testing.  Bone density scan. This is done to screen for osteoporosis. You may have this done starting at age 76. Follow these instructions at home: Eating and drinking  Eat a diet that includes fresh fruits and vegetables, whole grains, lean protein, and low-fat dairy products. Limit  your intake of foods with high amounts of sugar, saturated fats, and salt.  Take vitamin and mineral supplements as recommended by your health care provider.  Do not drink alcohol if your health care provider tells you not to drink.  If you drink alcohol: ? Limit how much you have to 0-1 drink a day. ? Be aware of how much alcohol is in your drink. In the [place] one drink equals one 12 oz bottle of beer (355 mL), one 5 oz glass of wine (148 mL), or one 1 oz glass of hard liquor (44 mL). Lifestyle  Take daily care of your teeth and gums.  Stay active. Exercise for at least 30 minutes on 5 or more days each week.  Do not use any products that contain nicotine or tobacco, such as cigarettes, e-cigarettes, and chewing tobacco. If you need help quitting, ask your health care provider.  If you are sexually active, practice safe sex. Use a condom or other form of protection in order to prevent STIs (sexually transmitted infections).  Talk with your health care provider about taking a low-dose aspirin or statin. What's next?  Go to your health care provider once a year for a well check visit.  Ask your health care provider how often you should have your eyes and teeth checked.  Stay up to date on all vaccines. This information is not intended to replace advice given to you by your health care provider. Make sure you discuss any questions you have with your health care provider. Document Released: 01/04/2016 Document Revised: 12/02/2018 Document Reviewed: 12/02/2018 Elsevier Patient Education  2020 [employer].

## 2019-06-23 NOTE — Assessment & Plan Note (Signed)
Check labs today.

## 2019-07-26 DIAGNOSIS — Z23 Encounter for immunization: Secondary | ICD-10-CM | POA: Diagnosis not present

## 2019-08-17 ENCOUNTER — Ambulatory Visit (INDEPENDENT_AMBULATORY_CARE_PROVIDER_SITE_OTHER): Payer: Medicare Other | Admitting: Family Medicine

## 2019-08-17 ENCOUNTER — Other Ambulatory Visit: Payer: Self-pay | Admitting: Family Medicine

## 2019-08-17 ENCOUNTER — Encounter: Payer: Self-pay | Admitting: Family Medicine

## 2019-08-17 ENCOUNTER — Other Ambulatory Visit: Payer: Self-pay

## 2019-08-17 ENCOUNTER — Telehealth: Payer: Self-pay

## 2019-08-17 DIAGNOSIS — IMO0002 Reserved for concepts with insufficient information to code with codable children: Secondary | ICD-10-CM

## 2019-08-17 DIAGNOSIS — E1165 Type 2 diabetes mellitus with hyperglycemia: Secondary | ICD-10-CM | POA: Diagnosis not present

## 2019-08-17 DIAGNOSIS — E1139 Type 2 diabetes mellitus with other diabetic ophthalmic complication: Secondary | ICD-10-CM

## 2019-08-17 DIAGNOSIS — E1151 Type 2 diabetes mellitus with diabetic peripheral angiopathy without gangrene: Secondary | ICD-10-CM

## 2019-08-17 MED ORDER — GLUCOSE BLOOD VI STRP: ORAL_STRIP | 12 refills | Status: DC

## 2019-08-17 MED ORDER — METFORMIN HCL 850 MG PO TABS: ORAL_TABLET | ORAL | 1 refills | Status: DC

## 2019-08-17 MED ORDER — ONETOUCH ULTRASOFT LANCETS MISC: 12 refills | Status: DC

## 2019-08-17 MED ORDER — BLOOD GLUCOSE MONITOR KIT: PACK | 0 refills | Status: DC

## 2019-08-17 NOTE — Telephone Encounter (Signed)
Copied from [place] [phone]. Topic: Quick Communication - Rx Refill/Question >> Aug 17, 2019  9:38 AM Rainey Pines A wrote: Medication: Lancets and test strips (Patient stated that she needs a new prescription sent to pharmacy.)  Has the patient contacted their pharmacy? Yes (Agent: If no, request that the patient contact the pharmacy for the refill.) (Agent: If yes, when and what did the pharmacy advise?)Contact PCP  Preferred Pharmacy (with phone number or street name):[facility] - [place], [place] - [address] (Phone) [phone] (Fax)    Agent: Please be advised that RX refills may take up to 3 business days. We ask that you follow-up with your pharmacy.

## 2019-08-17 NOTE — Telephone Encounter (Signed)
Requested medication (s) are due for refill today: yes  Requested medication (s) are on the active medication list: yes  Last refill: 05/2019  Future visit scheduled: yes  Notes to clinic:  Ordering provider and pcp are different   Requested Prescriptions  Pending Prescriptions Disp Refills   Lancets (ONETOUCH ULTRASOFT) lancets 100 each 12    Sig: Use as instructed once daily.  Dx Code: E11.9     Endocrinology: Diabetes - Testing Supplies Passed - 08/17/2019  9:42 AM      Passed - Valid encounter within last 12 months    Recent Outpatient Visits          1 month ago Type 2 diabetes mellitus with hyperglycemia, without long-term current use of insulin ([place])   Archivist at [place], DO   4 months ago Essential hypertension   Archivist at [place], DO   10 months ago Essential hypertension   Archivist at [place], DO   1 year ago DM (diabetes mellitus) type II uncontrolled with eye manifestation ([place])   Archivist at [place], DO   1 year ago Essential hypertension   Archivist at [place], DO      Future Appointments            In 4 months [place], [place] at [facility], [place]   In 10 months [place], [place] at [facility], [place]   In 10 months [place], [place], Research scientist (physical sciences) at [facility], [place]            glucose blood (ONE TOUCH ULTRA TEST) test strip 100 each 12    Sig: CHECK BLOOD SUGAR DAILY E11.9     Endocrinology: Diabetes - Testing Supplies Passed - 08/17/2019  9:42 AM      Passed - Valid encounter within last 12 months    Recent Outpatient Visits          1 month ago Type 2  diabetes mellitus with hyperglycemia, without long-term current use of insulin ([place])   Archivist at [place], DO   4 months ago Essential hypertension   Archivist at [place], DO   10 months ago Essential hypertension   Archivist at [place], DO   1 year ago DM (diabetes mellitus) type II uncontrolled with eye manifestation ([place])   Archivist at [place], DO   1 year ago Essential hypertension   Archivist at [place], DO      Future Appointments            In 4 months [place], [place] at [facility], [place]   In 10 months [place], [place] at [facility], [place]   In 10 months [place], [place], Research scientist (physical sciences) at [facility], [place]

## 2019-08-17 NOTE — Telephone Encounter (Signed)
Copied from [place] [phone]. Topic: General - Other >> Aug 17, 2019  9:27 AM Rainey Pines A wrote: Patient would like to speak with Dr .Etter Sjogren nurse in regards to her metformin medication dosage and instructions and also wanted to know if she needed an order for blood work due to her recent blood sugar readings.

## 2019-08-17 NOTE — Progress Notes (Signed)
Virtual Visit via Telephone Note  I connected with Cynthia Burns on 08/17/19 at 10:40 AM EDT by telephone and verified that I am speaking with the correct person using two identifiers.  Location: Patient: home  Provider: home   I discussed the limitations, risks, security and privacy concerns of performing an evaluation and management service by telephone and the availability of in person appointments. I also discussed with the patient that there may be a patient responsible charge related to this service. The patient expressed understanding and agreed to proceed.   History of Present Illness: Pt is home  8/4 she received tetanus and pneum shot  There was a mix up with her metformin  Her bs spiked to 263 this weekend and it slowly went down but not under 200 -- yesterday it was 135 and at noon 190 -- she then took 2 , 500 metformin at 117pm and at 430 it was 224 and at 645 it was 123    9am 209      Observations/Objective: No vitals obtained Pt in NAD  Assessment and Plan: 1. Uncontrolled type 2 diabetes mellitus with hyperglycemia (HCC) Increase metformin 850 tid Check labs tomorrow  - blood glucose meter kit and supplies KIT; Dispense based on patient and insurance preference. Use up to four times daily as directed. (FOR ICD-9 250.00, 250.01).  Dispense: 1 each; Refill: 0 - Comprehensive metabolic panel; Future - POCT Urinalysis Dipstick (Automated); Future   Follow Up Instructions:    I discussed the assessment and treatment plan with the patient. The patient was provided an opportunity to ask questions and all were answered. The patient agreed with the plan and demonstrated an understanding of the instructions.   The patient was advised to call back or seek an in-person evaluation if the symptoms worsen or if the condition fails to improve as anticipated.  I provided 20 minutes of non-face-to-face time during this encounter.   Ann Held, DO

## 2019-08-18 ENCOUNTER — Other Ambulatory Visit (INDEPENDENT_AMBULATORY_CARE_PROVIDER_SITE_OTHER): Payer: Medicare Other

## 2019-08-18 ENCOUNTER — Telehealth: Payer: Self-pay | Admitting: *Deleted

## 2019-08-18 ENCOUNTER — Ambulatory Visit (INDEPENDENT_AMBULATORY_CARE_PROVIDER_SITE_OTHER): Payer: Medicare Other

## 2019-08-18 ENCOUNTER — Other Ambulatory Visit: Payer: Self-pay | Admitting: Family Medicine

## 2019-08-18 DIAGNOSIS — E1169 Type 2 diabetes mellitus with other specified complication: Secondary | ICD-10-CM

## 2019-08-18 DIAGNOSIS — R1013 Epigastric pain: Secondary | ICD-10-CM | POA: Diagnosis not present

## 2019-08-18 DIAGNOSIS — Z23 Encounter for immunization: Secondary | ICD-10-CM | POA: Diagnosis not present

## 2019-08-18 DIAGNOSIS — E1165 Type 2 diabetes mellitus with hyperglycemia: Secondary | ICD-10-CM

## 2019-08-18 DIAGNOSIS — E785 Hyperlipidemia, unspecified: Secondary | ICD-10-CM

## 2019-08-18 LAB — URINALYSIS, ROUTINE W REFLEX MICROSCOPIC
Bilirubin Urine: NEGATIVE
Ketones, ur: NEGATIVE
Nitrite: NEGATIVE
Specific Gravity, Urine: 1.025 (ref 1.000–1.030)
Total Protein, Urine: NEGATIVE
Urine Glucose: NEGATIVE
Urobilinogen, UA: 0.2 (ref 0.0–1.0)
pH: 5.5 (ref 5.0–8.0)

## 2019-08-18 LAB — COMPREHENSIVE METABOLIC PANEL
ALT: 12 U/L (ref 0–35)
AST: 16 U/L (ref 0–37)
Albumin: 4.5 g/dL (ref 3.5–5.2)
Alkaline Phosphatase: 35 U/L — ABNORMAL LOW (ref 39–117)
BUN: 20 mg/dL (ref 6–23)
CO2: 26 mEq/L (ref 19–32)
Calcium: 9.5 mg/dL (ref 8.4–10.5)
Chloride: 105 mEq/L (ref 96–112)
Creatinine, Ser: 0.83 mg/dL (ref 0.40–1.20)
GFR: 67.64 mL/min (ref >60.00)
Glucose, Bld: 111 mg/dL — ABNORMAL HIGH (ref 70–99)
Potassium: 3.9 mEq/L (ref 3.5–5.1)
Sodium: 140 mEq/L (ref 135–145)
Total Bilirubin: 0.4 mg/dL (ref 0.2–1.2)
Total Protein: 7 g/dL (ref 6.0–8.3)

## 2019-08-18 NOTE — Addendum Note (Signed)
Addended by: Jiles Prows on: 08/18/2019 10:08 AM   Modules accepted: Orders

## 2019-08-18 NOTE — Telephone Encounter (Signed)
Pt came to have her flu shot this am. She also stated that she recent had her tetanus & pneumonia shot on 8/4. Inform pt will update her record w/immunization.Marland KitchenJohny Burns

## 2019-08-19 ENCOUNTER — Telehealth: Payer: Self-pay | Admitting: *Deleted

## 2019-08-19 NOTE — Telephone Encounter (Signed)
Copied from [place] [phone]. Topic: General - Other >> Aug 19, 2019  8:24 AM Rayann Heman wrote: Reason for CRM: pt called and stated that she would like a call back regarding recent labs. Pt states that she think that she needs to schedule an appointment. Please advise

## 2019-08-22 ENCOUNTER — Telehealth: Payer: Self-pay | Admitting: *Deleted

## 2019-08-22 DIAGNOSIS — E1165 Type 2 diabetes mellitus with hyperglycemia: Secondary | ICD-10-CM

## 2019-08-22 DIAGNOSIS — IMO0002 Reserved for concepts with insufficient information to code with codable children: Secondary | ICD-10-CM

## 2019-08-22 DIAGNOSIS — E1139 Type 2 diabetes mellitus with other diabetic ophthalmic complication: Secondary | ICD-10-CM

## 2019-08-22 NOTE — Telephone Encounter (Signed)
Copied from [place] [phone]. Topic: General - Other >> Aug 22, 2019  2:29 PM Pauline Good wrote: Reason for CRM: pt want to know the status of her diabetic meter prescription. Please call pt to advise.

## 2019-08-23 MED ORDER — ONETOUCH ULTRA 2 W/DEVICE KIT: PACK | 0 refills | Status: AC

## 2019-08-23 MED ORDER — METFORMIN HCL 850 MG PO TABS: ORAL_TABLET | ORAL | 1 refills | Status: DC

## 2019-08-23 MED ORDER — ONETOUCH DELICA LANCETS 33G MISC: 1 refills | Status: DC

## 2019-08-23 MED ORDER — GLUCOSE BLOOD VI STRP: ORAL_STRIP | 1 refills | Status: DC

## 2019-08-23 NOTE — Telephone Encounter (Signed)
RXS RESENT IN AND PT NOTIFIED.

## 2019-08-24 NOTE — Telephone Encounter (Signed)
Patient notified of results.

## 2019-08-24 NOTE — Telephone Encounter (Signed)
Spoke with pt. She already has follow up with PCP on 12/26/19 and she will wait to repeat labs at that time. Pt wants PCP to know that her fasting glucose has been coming down. 08/23/19 was 124 and 9/2 115.  Pt also wanted urinalysis results. She states she is not having any symptoms at present. Please advise?

## 2019-08-24 NOTE — Telephone Encounter (Signed)
Urine essentially normal small am of bacteria but no nitrates  If not symptomatic no reason to repeat

## 2019-09-06 ENCOUNTER — Other Ambulatory Visit: Payer: Self-pay | Admitting: Family Medicine

## 2019-09-22 ENCOUNTER — Other Ambulatory Visit: Payer: Self-pay | Admitting: Family Medicine

## 2019-09-22 DIAGNOSIS — Z1231 Encounter for screening mammogram for malignant neoplasm of breast: Secondary | ICD-10-CM

## 2019-09-26 ENCOUNTER — Telehealth: Payer: Self-pay | Admitting: Family Medicine

## 2019-09-26 DIAGNOSIS — E1139 Type 2 diabetes mellitus with other diabetic ophthalmic complication: Secondary | ICD-10-CM

## 2019-09-26 DIAGNOSIS — IMO0002 Reserved for concepts with insufficient information to code with codable children: Secondary | ICD-10-CM

## 2019-09-26 DIAGNOSIS — E119 Type 2 diabetes mellitus without complications: Secondary | ICD-10-CM

## 2019-09-26 MED ORDER — GLUCOSE BLOOD VI STRP: ORAL_STRIP | 1 refills | Status: DC

## 2019-09-26 NOTE — Telephone Encounter (Signed)
rx refill glucose blood (ONE TOUCH ULTRA TEST) test strip  PHARMACY  [id number] [place], [place] (Phone) [phone] (Fax)

## 2019-09-26 NOTE — Telephone Encounter (Signed)
Refill sent.

## 2019-09-26 NOTE — Telephone Encounter (Signed)
rx refill sitaGLIPtin (JANUVIA) 100 MG tablet  PHARMACY [facility] - [place], [place] - [address] [phone] (Phone) [phone] (Fax)   Patient is requesting a year supply if possible.

## 2019-09-28 MED ORDER — SITAGLIPTIN PHOSPHATE 100 MG PO TABS: 100.0000 mg | ORAL_TABLET | Freq: Every day | ORAL | 1 refills | Status: DC

## 2019-09-28 NOTE — Telephone Encounter (Signed)
Refill sent.

## 2019-10-18 ENCOUNTER — Ambulatory Visit
Admission: RE | Admit: 2019-10-18 | Discharge: 2019-10-18 | Disposition: A | Discharge status: Home / Self Care | Payer: Medicare Other | Source: Ambulatory Visit | Attending: Family Medicine | Admitting: Family Medicine

## 2019-10-18 ENCOUNTER — Other Ambulatory Visit: Payer: Self-pay

## 2019-10-18 DIAGNOSIS — Z1231 Encounter for screening mammogram for malignant neoplasm of breast: Secondary | ICD-10-CM

## 2019-11-29 NOTE — Progress Notes (Signed)
[name], female   DOB: 1946-10-28, 73 y.o.   MRN: [id number]     [name] returns today for followup. 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  No HSM or HJR Distal pulses intact with no bruits No edema Neuro non-focal Skin warm and dry No muscular weakness   Current Outpatient Medications  Medication Sig Dispense Refill  . amoxicillin (AMOXIL) 500 MG capsule Take 500 mg by mouth. For dental procedures    . aspirin EC 81 MG tablet Take 1 tablet (81 mg total) by mouth daily. 90 tablet 3  . Biotin (PA BIOTIN) 1000 MCG tablet Take 1,000 mcg by mouth daily.    . Blood Glucose Monitoring Suppl (ONE TOUCH ULTRA 2) w/Device KIT CHECK BLOOD SUGAR TWICE DAILY.  DX CODE  E11.9 1 kit 0  . calcium carbonate (OSCAL) 1500 (600 Ca) MG TABS tablet Take 600 mg of elemental calcium by mouth daily with breakfast.    . Cyanocobalamin (VITAMIN B-12 PO) Take 1 tablet by mouth daily.     .  furosemide (LASIX) 20 MG tablet Take 0.5 tablets (10 mg total) by mouth daily. 45 tablet 3  . glucose blood (ONE TOUCH ULTRA TEST) test strip CHECK BLOOD SUGAR TWICE DAILY.  DX CODE  E11.9 200 each 1  . losartan (COZAAR) 25 MG tablet Take 1 tablet (25 mg total) by mouth daily. 90 tablet 3  . meclizine (ANTIVERT) 25 MG tablet Take 1 tablet (25 mg total) 3 (three) times daily as needed by mouth for dizziness. 90 tablet 3  . metFORMIN (GLUCOPHAGE) 850 MG tablet 1 po tid 270 tablet 1  . nebivolol (BYSTOLIC) 5 MG tablet Take 1 tablet (5 mg total) by mouth daily. 90 tablet 3  . OneTouch Delica Lancets 53G MISC CHECK BLOOD SUGAR TWICE DAILY.  DX CODE  E11.9 200 each 1  . rosuvastatin (CRESTOR) 20 MG tablet Take 1 tablet (20 mg total) by mouth daily. 90 tablet 3  . sitaGLIPtin (JANUVIA) 100 MG tablet Take 1 tablet (100 mg total) by mouth daily. 90 tablet 1  . VITAMIN D, CHOLECALCIFEROL, PO Take 1 tablet by mouth daily.      No current facility-administered medications for this visit.     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  Target hemoglobin A1c is 6.5 or less.  Continue current medications. Obesity: Exercise and low carb diet discussed Chol:  Lab Results  Component Value Date   LDLCALC 39 06/23/2019   HTN:  Well controlled.  Continue current medications and low sodium Dash type diet.   Edema: stable can consider adding diuretic in future if needed  GB: post lap choly with improved symptoms    Peter Nishan  

## 2019-11-30 ENCOUNTER — Other Ambulatory Visit: Payer: Self-pay

## 2019-11-30 ENCOUNTER — Encounter: Payer: Self-pay | Admitting: Cardiovascular Disease

## 2019-11-30 ENCOUNTER — Ambulatory Visit (INDEPENDENT_AMBULATORY_CARE_PROVIDER_SITE_OTHER): Payer: Medicare Other | Admitting: Cardiovascular Disease

## 2019-11-30 VITALS — BP 140/80 | HR 76 | Ht 62.0 in | Wt 137.2 lb

## 2019-11-30 DIAGNOSIS — I10 Essential (primary) hypertension: Secondary | ICD-10-CM | POA: Diagnosis not present

## 2019-11-30 DIAGNOSIS — IMO0002 Reserved for concepts with insufficient information to code with codable children: Secondary | ICD-10-CM

## 2019-11-30 DIAGNOSIS — E1151 Type 2 diabetes mellitus with diabetic peripheral angiopathy without gangrene: Secondary | ICD-10-CM

## 2019-11-30 DIAGNOSIS — Q2549 Other congenital malformations of aorta: Secondary | ICD-10-CM

## 2019-11-30 MED ORDER — FUROSEMIDE 20 MG PO TABS: 10.0000 mg | ORAL_TABLET | Freq: Every day | ORAL | 3 refills | Status: DC

## 2019-11-30 MED ORDER — ROSUVASTATIN CALCIUM 20 MG PO TABS: 20.0000 mg | ORAL_TABLET | Freq: Every day | ORAL | 3 refills | Status: DC

## 2019-11-30 MED ORDER — NEBIVOLOL HCL 5 MG PO TABS: 5.0000 mg | ORAL_TABLET | Freq: Every day | ORAL | 3 refills | Status: DC

## 2019-11-30 MED ORDER — LOSARTAN POTASSIUM 25 MG PO TABS: 25.0000 mg | ORAL_TABLET | Freq: Every day | ORAL | 3 refills | Status: DC

## 2019-11-30 NOTE — Patient Instructions (Addendum)

## 2019-12-22 ENCOUNTER — Other Ambulatory Visit: Payer: Self-pay

## 2019-12-26 ENCOUNTER — Encounter: Payer: Self-pay | Admitting: Family Medicine

## 2019-12-26 ENCOUNTER — Ambulatory Visit (INDEPENDENT_AMBULATORY_CARE_PROVIDER_SITE_OTHER): Payer: Medicare Other | Admitting: Family Medicine

## 2019-12-26 ENCOUNTER — Other Ambulatory Visit: Payer: Self-pay

## 2019-12-26 VITALS — BP 150/80 | HR 64 | Temp 97.3°F | Resp 18 | Ht 62.0 in | Wt 139.8 lb

## 2019-12-26 DIAGNOSIS — E1165 Type 2 diabetes mellitus with hyperglycemia: Secondary | ICD-10-CM | POA: Diagnosis not present

## 2019-12-26 DIAGNOSIS — E1169 Type 2 diabetes mellitus with other specified complication: Secondary | ICD-10-CM

## 2019-12-26 DIAGNOSIS — E785 Hyperlipidemia, unspecified: Secondary | ICD-10-CM

## 2019-12-26 DIAGNOSIS — IMO0002 Reserved for concepts with insufficient information to code with codable children: Secondary | ICD-10-CM

## 2019-12-26 DIAGNOSIS — E1139 Type 2 diabetes mellitus with other diabetic ophthalmic complication: Secondary | ICD-10-CM

## 2019-12-26 DIAGNOSIS — I10 Essential (primary) hypertension: Secondary | ICD-10-CM

## 2019-12-26 DIAGNOSIS — E119 Type 2 diabetes mellitus without complications: Secondary | ICD-10-CM

## 2019-12-26 LAB — COMPREHENSIVE METABOLIC PANEL
ALT: 17 U/L (ref 0–35)
AST: 17 U/L (ref 0–37)
Albumin: 4.5 g/dL (ref 3.5–5.2)
Alkaline Phosphatase: 41 U/L (ref 39–117)
BUN: 15 mg/dL (ref 6–23)
CO2: 30 mEq/L (ref 19–32)
Calcium: 9.2 mg/dL (ref 8.4–10.5)
Chloride: 102 mEq/L (ref 96–112)
Creatinine, Ser: 0.83 mg/dL (ref 0.40–1.20)
GFR: 67.57 mL/min (ref >60.00)
Glucose, Bld: 129 mg/dL — ABNORMAL HIGH (ref 70–99)
Potassium: 3.7 mEq/L (ref 3.5–5.1)
Sodium: 141 mEq/L (ref 135–145)
Total Bilirubin: 0.4 mg/dL (ref 0.2–1.2)
Total Protein: 6.8 g/dL (ref 6.0–8.3)

## 2019-12-26 LAB — LIPID PANEL
Cholesterol: 128 mg/dL (ref 0–200)
HDL: 45.9 mg/dL (ref >39.00)
LDL Cholesterol: 52 mg/dL (ref 0–99)
NonHDL: 82.33
Total CHOL/HDL Ratio: 3
Triglycerides: 152 mg/dL — ABNORMAL HIGH (ref 0.0–149.0)
VLDL: 30.4 mg/dL (ref 0.0–40.0)

## 2019-12-26 LAB — MICROALBUMIN / CREATININE URINE RATIO
Creatinine,U: 215.1 mg/dL
Microalb Creat Ratio: 0.8 mg/g (ref 0.0–30.0)
Microalb, Ur: 1.7 mg/dL (ref 0.0–1.9)

## 2019-12-26 LAB — HEMOGLOBIN A1C: Hgb A1c MFr Bld: 6.9 % — ABNORMAL HIGH (ref 4.6–6.5)

## 2019-12-26 MED ORDER — METFORMIN HCL 850 MG PO TABS: ORAL_TABLET | ORAL | 1 refills | Status: DC

## 2019-12-26 MED ORDER — ONETOUCH DELICA LANCETS 33G MISC: 1 refills | Status: DC

## 2019-12-26 MED ORDER — GLUCOSE BLOOD VI STRP: ORAL_STRIP | 1 refills | Status: DC

## 2019-12-26 NOTE — Assessment & Plan Note (Signed)
hgba1c to be checked, minimize simple carbs. Increase exercise as tolerated. Continue current meds  

## 2019-12-26 NOTE — Progress Notes (Signed)
[name], female    DOB: 1946-08-19  Age: 74 y.o. MRN: [id number]    Subjective:  Subjective  HPI Cynthia Burns presents for bp , chol and dm  HYPERTENSION   Blood pressure range-good per pt   Chest pain- no      Dyspnea- no Lightheadedness- no   Edema- no  Other side effects - no   Medication compliance: good Low salt diet- yes     DIABETES    Blood Sugar ranges-good per pt   Polyuria- no New Visual problems- no  Hypoglycemic symptoms- no  Other side effects-no Medication compliance - good Last eye exam- 04/2019 Foot exam- 06/2019   HYPERLIPIDEMIA  Medication compliance- good  RUQ pain- no  Muscle aches- no Other side effects-no      Review of Systems  Constitutional: Negative for appetite change, diaphoresis, fatigue and unexpected weight change.  Eyes: Negative for pain, redness and visual disturbance.  Respiratory: Negative for cough, chest tightness, shortness of breath and wheezing.   Cardiovascular: Negative for chest pain, palpitations and leg swelling.  Endocrine: Negative for cold intolerance, heat intolerance, polydipsia, polyphagia and polyuria.  Genitourinary: Negative for difficulty urinating, dysuria and frequency.  Neurological: Negative for dizziness, light-headedness, numbness and headaches.    History Past Medical History:  Diagnosis Date  . Diabetes mellitus without complication ([place])   . 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 HYPERTENSION   . MITRAL VALVE PROLAPSE   . OSTEOPENIA   . Overweight(278.02)   . PHARYNGITIS, ACUTE     She has a past surgical history that includes Abdominal hysterectomy and Cholecystectomy.   Her family history includes Diabetes in her father; Emphysema in her father; Heart disease in her mother; Hypertension in an other family member.She reports that she has never smoked. She has never used smokeless tobacco. She reports that she does not drink alcohol or use drugs.  Current Outpatient  Medications on File Prior to Visit  Medication Sig Dispense Refill  . amoxicillin (AMOXIL) 500 MG capsule Take 500 mg by mouth. For dental procedures    . aspirin EC 81 MG tablet Take 1 tablet (81 mg total) by mouth daily. 90 tablet 3  . Biotin (PA BIOTIN) 1000 MCG tablet Take 1,000 mcg by mouth daily.    . Blood Glucose Monitoring Suppl (ONE TOUCH ULTRA 2) w/Device KIT CHECK BLOOD SUGAR TWICE DAILY.  DX CODE  E11.9 1 kit 0  . calcium carbonate (OSCAL) 1500 (600 Ca) MG TABS tablet Take 600 mg of elemental calcium by mouth daily with breakfast.    . Cyanocobalamin (VITAMIN B-12 PO) Take 1 tablet by mouth daily.     . furosemide (LASIX) 20 MG tablet Take 0.5 tablets (10 mg total) by mouth daily. 45 tablet 3  . losartan (COZAAR) 25 MG tablet Take 1 tablet (25 mg total) by mouth daily. 90 tablet 3  . meclizine (ANTIVERT) 25 MG tablet Take 1 tablet (25 mg total) 3 (three) times daily as needed by mouth for dizziness. 90 tablet 3  . nebivolol (BYSTOLIC) 5 MG tablet Take 1 tablet (5 mg total) by mouth daily. 90 tablet 3  . rosuvastatin (CRESTOR) 20 MG tablet Take 1 tablet (20 mg total) by mouth daily. 90 tablet 3  . sitaGLIPtin (JANUVIA) 100 MG tablet Take 1 tablet (100 mg total) by mouth daily. 90 tablet 1  . VITAMIN D, CHOLECALCIFEROL, PO Take 1 tablet by mouth daily.  No current facility-administered medications on file prior to visit.     Objective:  Objective  Physical Exam Vitals and nursing note reviewed.  Constitutional:      Appearance: She is well-developed.  HENT:     Head: Normocephalic and atraumatic.  Eyes:     Conjunctiva/sclera: Conjunctivae normal.  Neck:     Thyroid: No thyromegaly.     Vascular: No carotid bruit or JVD.  Cardiovascular:     Rate and Rhythm: Normal rate and regular rhythm.     Heart sounds: Normal heart sounds. No murmur.  Pulmonary:     Effort: Pulmonary effort is normal. No respiratory distress.     Breath sounds: Normal breath sounds. No  wheezing or rales.  Chest:     Chest wall: No tenderness.  Musculoskeletal:     Cervical back: Normal range of motion and neck supple.  Neurological:     Mental Status: She is alert and oriented to person, place, and time.    BP (!) 150/80 (BP Location: Left Arm, Patient Position: Sitting, Cuff Size: Normal)   Pulse 64   Temp (!) 97.3 F (36.3 C) (Temporal)   Resp 18   Ht '5\' 2"'  (1.575 m)   Wt 139 lb 12.8 oz (63.4 kg)   SpO2 98%   BMI 25.57 kg/m  Wt Readings from Last 3 Encounters:  12/26/19 139 lb 12.8 oz (63.4 kg)  11/30/19 137 lb 3.2 oz (62.2 kg)  06/23/19 136 lb 3.2 oz (61.8 kg)     Lab Results  Component Value Date   WBC 6.0 09/25/2016   HGB 13.0 09/25/2016   HCT 38.8 09/25/2016   PLT 257.0 09/25/2016   GLUCOSE 111 (H) 08/18/2019   CHOL 112 06/23/2019   TRIG 166.0 (H) 06/23/2019   HDL 39.60 06/23/2019   LDLDIRECT 53.0 05/25/2018   LDLCALC 39 06/23/2019   ALT 12 08/18/2019   AST 16 08/18/2019   NA 140 08/18/2019   K 3.9 08/18/2019   CL 105 08/18/2019   CREATININE 0.83 08/18/2019   BUN 20 08/18/2019   CO2 26 08/18/2019   HGBA1C 6.8 (H) 06/23/2019   MICROALBUR 1.9 06/23/2019    MM 3D SCREEN BREAST BILATERAL  Result Date: 10/18/2019 CLINICAL DATA:  Screening. EXAM: DIGITAL SCREENING BILATERAL MAMMOGRAM WITH TOMO AND CAD COMPARISON:  Previous exam(s). ACR Breast Density Category b: There are scattered areas of fibroglandular density. FINDINGS: There are no findings suspicious for malignancy. Images were processed with CAD. IMPRESSION: No mammographic evidence of malignancy. A result letter of this screening mammogram will be mailed directly to the patient. RECOMMENDATION: Screening mammogram in one year. (Code:[id number]-B-01Y) BI-RADS CATEGORY  1: Negative. Electronically Signed   By: Ammie Ferrier M.D.   On: 10/18/2019 11:03     Assessment & Plan:  Plan  I am having Carlene Coria maintain her aspirin EC, Cyanocobalamin (VITAMIN B-12 PO), (VITAMIN D,  CHOLECALCIFEROL, PO), Biotin, meclizine, amoxicillin, calcium carbonate, ONE TOUCH ULTRA 2, sitaGLIPtin, rosuvastatin, losartan, nebivolol, furosemide, metFORMIN, glucose blood, and OneTouch Delica Lancets 68T.  Meds ordered this encounter  Medications  . metFORMIN (GLUCOPHAGE) 850 MG tablet    Sig: 1 po tid    Dispense:  270 tablet    Refill:  1  . glucose blood (ONE TOUCH ULTRA TEST) test strip    Sig: CHECK BLOOD SUGAR TWICE DAILY.  DX CODE  E11.9    Dispense:  200 each    Refill:  1  . OneTouch Delica Lancets 15B MISC  Sig: CHECK BLOOD SUGAR TWICE DAILY.  DX CODE  E11.9    Dispense:  200 each    Refill:  1    Problem List Items Addressed This Visit      Unprioritized   Essential hypertension - Primary    Well controlled, no changes to meds. Encouraged heart healthy diet such as the DASH diet and exercise as tolerated.       Relevant Orders   Lipid panel   Hemoglobin A1c   Comprehensive metabolic panel   Microalbumin / creatinine urine ratio   Hyperlipidemia LDL goal <70    Encouraged heart healthy diet, increase exercise, avoid trans fats, consider a krill oil cap daily      Type 2 diabetes mellitus not at goal [facility])    hgba1c to be checked, minimize simple carbs. Increase exercise as tolerated. Continue current meds       Relevant Medications   metFORMIN (GLUCOPHAGE) 850 MG tablet    Other Visit Diagnoses    Uncontrolled type 2 diabetes mellitus with hyperglycemia (HCC)       Relevant Medications   metFORMIN (GLUCOPHAGE) 850 MG tablet   Other Relevant Orders   Lipid panel   Hemoglobin A1c   Comprehensive metabolic panel   Microalbumin / creatinine urine ratio   DM (diabetes mellitus) type II uncontrolled with eye manifestation (HCC)       Relevant Medications   metFORMIN (GLUCOPHAGE) 850 MG tablet   glucose blood (ONE TOUCH ULTRA TEST) test strip   OneTouch Delica Lancets 22G MISC   Other Relevant Orders   Hemoglobin A1c   Comprehensive metabolic panel    Microalbumin / creatinine urine ratio   Hyperlipidemia associated with type 2 diabetes mellitus (HCC)       Relevant Medications   metFORMIN (GLUCOPHAGE) 850 MG tablet   Other Relevant Orders   Lipid panel   Hemoglobin A1c   Comprehensive metabolic panel   Microalbumin / creatinine urine ratio      Follow-up: Return in about 6 months (around 06/24/2020), or if symptoms worsen or fail to improve, for htn, hyperlipidemia, DM, fasting,   AWV with RN .  Ann Held, DO

## 2019-12-26 NOTE — Assessment & Plan Note (Signed)
Encouraged heart healthy diet, increase exercise, avoid trans fats, consider a krill oil cap daily 

## 2019-12-26 NOTE — Assessment & Plan Note (Signed)
Well controlled, no changes to meds. Encouraged heart healthy diet such as the DASH diet and exercise as tolerated.  °

## 2019-12-26 NOTE — Patient Instructions (Signed)
Carbohydrate Counting for Diabetes Mellitus, Adult  Carbohydrate counting is a method of keeping track of how many carbohydrates you eat. Eating carbohydrates naturally increases the amount of sugar (glucose) in the blood. Counting how many carbohydrates you eat helps keep your blood glucose within normal limits, which helps you manage your diabetes (diabetes mellitus). It is important to know how many carbohydrates you can safely have in each meal. This is different for every person. A diet and nutrition specialist (registered dietitian) can help you make a meal plan and calculate how many carbohydrates you should have at each meal and snack. Carbohydrates are found in the following foods:  Grains, such as breads and cereals.  Dried beans and soy products.  Starchy vegetables, such as potatoes, peas, and corn.  Fruit and fruit juices.  Milk and yogurt.  Sweets and snack foods, such as cake, cookies, candy, chips, and soft drinks. How do I count carbohydrates? There are two ways to count carbohydrates in food. You can use either of the methods or a combination of both. Reading "Nutrition Facts" on packaged food The "Nutrition Facts" list is included on the labels of almost all packaged foods and beverages in the [place] It includes:  The serving size.  Information about nutrients in each serving, including the grams (g) of carbohydrate per serving. To use the "Nutrition Facts":  Decide how many servings you will have.  Multiply the number of servings by the number of carbohydrates per serving.  The resulting number is the total amount of carbohydrates that you will be having. Learning standard serving sizes of other foods When you eat carbohydrate foods that are not packaged or do not include "Nutrition Facts" on the label, you need to measure the servings in order to count the amount of carbohydrates:  Measure the foods that you will eat with a food scale or measuring cup, if  needed.  Decide how many standard-size servings you will eat.  Multiply the number of servings by 15. Most carbohydrate-rich foods have about 15 g of carbohydrates per serving. ? For example, if you eat 8 oz (170 g) of strawberries, you will have eaten 2 servings and 30 g of carbohydrates (2 servings x 15 g = 30 g).  For foods that have more than one food mixed, such as soups and casseroles, you must count the carbohydrates in each food that is included. The following list contains standard serving sizes of common carbohydrate-rich foods. Each of these servings has about 15 g of carbohydrates:   hamburger bun or  English muffin.   oz (15 mL) syrup.   oz (14 g) jelly.  1 slice of bread.  1 six-inch tortilla.  3 oz (85 g) cooked rice or pasta.  4 oz (113 g) cooked dried beans.  4 oz (113 g) starchy vegetable, such as peas, corn, or potatoes.  4 oz (113 g) hot cereal.  4 oz (113 g) mashed potatoes or  of a large baked potato.  4 oz (113 g) canned or frozen fruit.  4 oz (120 mL) fruit juice.  4-6 crackers.  6 chicken nuggets.  6 oz (170 g) unsweetened dry cereal.  6 oz (170 g) plain fat-free yogurt or yogurt sweetened with artificial sweeteners.  8 oz (240 mL) milk.  8 oz (170 g) fresh fruit or one small piece of fruit.  24 oz (680 g) popped popcorn. Example of carbohydrate counting Sample meal  3 oz (85 g) chicken breast.  6 oz (170 g)   brown rice.  4 oz (113 g) corn.  8 oz (240 mL) milk.  8 oz (170 g) strawberries with sugar-free whipped topping. Carbohydrate calculation 1. Identify the foods that contain carbohydrates: ? Rice. ? Corn. ? Milk. ? Strawberries. 2. Calculate how many servings you have of each food: ? 2 servings rice. ? 1 serving corn. ? 1 serving milk. ? 1 serving strawberries. 3. Multiply each number of servings by 15 g: ? 2 servings rice x 15 g = 30 g. ? 1 serving corn x 15 g = 15 g. ? 1 serving milk x 15 g = 15 g. ? 1  serving strawberries x 15 g = 15 g. 4. Add together all of the amounts to find the total grams of carbohydrates eaten: ? 30 g + 15 g + 15 g + 15 g = 75 g of carbohydrates total. Summary  Carbohydrate counting is a method of keeping track of how many carbohydrates you eat.  Eating carbohydrates naturally increases the amount of sugar (glucose) in the blood.  Counting how many carbohydrates you eat helps keep your blood glucose within normal limits, which helps you manage your diabetes.  A diet and nutrition specialist (registered dietitian) can help you make a meal plan and calculate how many carbohydrates you should have at each meal and snack. This information is not intended to replace advice given to you by your health care provider. Make sure you discuss any questions you have with your health care provider. Document Revised: 07/02/2017 Document Reviewed: 05/21/2016 Elsevier Patient Education  2020 [employer]  

## 2020-02-07 ENCOUNTER — Telehealth: Payer: Self-pay | Admitting: Family Medicine

## 2020-02-07 NOTE — Telephone Encounter (Signed)
Pt is going for her Covid Vaccine on Thursday she was told to call and ask if she should stop taking her aspirin EC 81 MG tablet. Please advise

## 2020-02-07 NOTE — Telephone Encounter (Signed)
Pt advised per [place] that she doesn't have to stop taking aspirin

## 2020-02-14 DIAGNOSIS — L821 Other seborrheic keratosis: Secondary | ICD-10-CM | POA: Diagnosis not present

## 2020-02-14 DIAGNOSIS — D225 Melanocytic nevi of trunk: Secondary | ICD-10-CM | POA: Diagnosis not present

## 2020-02-14 DIAGNOSIS — D1801 Hemangioma of skin and subcutaneous tissue: Secondary | ICD-10-CM | POA: Diagnosis not present

## 2020-02-14 DIAGNOSIS — L814 Other melanin hyperpigmentation: Secondary | ICD-10-CM | POA: Diagnosis not present

## 2020-04-06 ENCOUNTER — Other Ambulatory Visit: Payer: Self-pay | Admitting: Family Medicine

## 2020-04-06 DIAGNOSIS — E119 Type 2 diabetes mellitus without complications: Secondary | ICD-10-CM

## 2020-04-06 DIAGNOSIS — IMO0002 Reserved for concepts with insufficient information to code with codable children: Secondary | ICD-10-CM

## 2020-04-06 DIAGNOSIS — E1139 Type 2 diabetes mellitus with other diabetic ophthalmic complication: Secondary | ICD-10-CM

## 2020-05-14 DIAGNOSIS — E119 Type 2 diabetes mellitus without complications: Secondary | ICD-10-CM | POA: Diagnosis not present

## 2020-05-14 DIAGNOSIS — H43813 Vitreous degeneration, bilateral: Secondary | ICD-10-CM | POA: Diagnosis not present

## 2020-05-14 DIAGNOSIS — H5203 Hypermetropia, bilateral: Secondary | ICD-10-CM | POA: Diagnosis not present

## 2020-05-14 DIAGNOSIS — H2513 Age-related nuclear cataract, bilateral: Secondary | ICD-10-CM | POA: Diagnosis not present

## 2020-05-14 LAB — HM DIABETES EYE EXAM

## 2020-06-26 ENCOUNTER — Ambulatory Visit: Payer: Medicare Other | Admitting: Family Medicine

## 2020-06-28 ENCOUNTER — Ambulatory Visit: Payer: Medicare Other | Admitting: *Deleted

## 2020-07-06 ENCOUNTER — Telehealth: Payer: Self-pay | Admitting: Family Medicine

## 2020-07-06 ENCOUNTER — Ambulatory Visit (INDEPENDENT_AMBULATORY_CARE_PROVIDER_SITE_OTHER): Payer: Medicare Other | Admitting: Family Medicine

## 2020-07-06 ENCOUNTER — Encounter: Payer: Self-pay | Admitting: Family Medicine

## 2020-07-06 ENCOUNTER — Other Ambulatory Visit: Payer: Self-pay

## 2020-07-06 VITALS — BP 126/82 | HR 61 | Temp 97.7°F | Resp 18 | Ht 62.0 in | Wt 138.2 lb

## 2020-07-06 DIAGNOSIS — E1169 Type 2 diabetes mellitus with other specified complication: Secondary | ICD-10-CM

## 2020-07-06 DIAGNOSIS — E2839 Other primary ovarian failure: Secondary | ICD-10-CM

## 2020-07-06 DIAGNOSIS — E785 Hyperlipidemia, unspecified: Secondary | ICD-10-CM | POA: Diagnosis not present

## 2020-07-06 DIAGNOSIS — E119 Type 2 diabetes mellitus without complications: Secondary | ICD-10-CM | POA: Diagnosis not present

## 2020-07-06 DIAGNOSIS — E1165 Type 2 diabetes mellitus with hyperglycemia: Secondary | ICD-10-CM

## 2020-07-06 DIAGNOSIS — I10 Essential (primary) hypertension: Secondary | ICD-10-CM

## 2020-07-06 DIAGNOSIS — H6121 Impacted cerumen, right ear: Secondary | ICD-10-CM

## 2020-07-06 LAB — COMPREHENSIVE METABOLIC PANEL
ALT: 16 U/L (ref 0–35)
AST: 20 U/L (ref 0–37)
Albumin: 4.7 g/dL (ref 3.5–5.2)
Alkaline Phosphatase: 37 U/L — ABNORMAL LOW (ref 39–117)
BUN: 19 mg/dL (ref 6–23)
CO2: 29 mEq/L (ref 19–32)
Calcium: 9.6 mg/dL (ref 8.4–10.5)
Chloride: 101 mEq/L (ref 96–112)
Creatinine, Ser: 0.94 mg/dL (ref 0.40–1.20)
GFR: 58.45 mL/min — ABNORMAL LOW (ref >60.00)
Glucose, Bld: 138 mg/dL — ABNORMAL HIGH (ref 70–99)
Potassium: 4 mEq/L (ref 3.5–5.1)
Sodium: 138 mEq/L (ref 135–145)
Total Bilirubin: 0.4 mg/dL (ref 0.2–1.2)
Total Protein: 7.4 g/dL (ref 6.0–8.3)

## 2020-07-06 LAB — LIPID PANEL
Cholesterol: 123 mg/dL (ref 0–200)
HDL: 46.4 mg/dL (ref >39.00)
LDL Cholesterol: 54 mg/dL (ref 0–99)
NonHDL: 76.46
Total CHOL/HDL Ratio: 3
Triglycerides: 114 mg/dL (ref 0.0–149.0)
VLDL: 22.8 mg/dL (ref 0.0–40.0)

## 2020-07-06 LAB — MICROALBUMIN / CREATININE URINE RATIO
Creatinine,U: 137.1 mg/dL
Microalb Creat Ratio: 0.9 mg/g (ref 0.0–30.0)
Microalb, Ur: 1.3 mg/dL (ref 0.0–1.9)

## 2020-07-06 LAB — HEMOGLOBIN A1C: Hgb A1c MFr Bld: 7 % — ABNORMAL HIGH (ref 4.6–6.5)

## 2020-07-06 NOTE — Telephone Encounter (Signed)
ERROR

## 2020-07-06 NOTE — Assessment & Plan Note (Signed)
Pt to use debrox  And rto for irrigation / hearing test

## 2020-07-06 NOTE — Assessment & Plan Note (Signed)
Tolerating statin, encouraged heart healthy diet, avoid trans fats, minimize simple carbs and saturated fats. Increase exercise as tolerated 

## 2020-07-06 NOTE — Progress Notes (Signed)
I connected with Cynthia Burns today by telephone and verified that I am speaking with the correct person using two identifiers. Location patient: home Location provider: work Persons participating in the virtual visit: patient, Marine scientist.    I discussed the limitations, risks, security and privacy concerns of performing an evaluation and management service by telephone and the availability of in person appointments. I also discussed with the patient that there may be a patient responsible charge related to this service. The patient expressed understanding and verbally consented to this telephonic visit.    Interactive audio and video telecommunications were attempted between this provider and patient, however failed, due to patient having technical difficulties OR patient did not have access to video capability.  We continued and completed visit with audio only.  Some vital signs may be absent or patient reported.   Subjective:   Cynthia Burns is a 74 y.o. female who presents for Medicare Annual (Subsequent) preventive examination.  Review of Systems    Cardiac Risk Factors include: advanced age (>50mn, >>47women);diabetes mellitus;dyslipidemia;hypertension     Objective:    Today's Vitals   07/09/20 1057  BP: 129/66  Pulse: (!) 58   There is no height or weight on file to calculate BMI.  Advanced Directives 07/09/2020 06/23/2019 05/25/2018 09/25/2016  Does Patient Have a Medical Advance Directive? Yes Yes Yes Yes  Type of AParamedicof [place]Living will [place]Living will H[place]Living will [place]Living will  Does patient want to make changes to medical advance directive? No - Patient declined No - Patient declined - No - Patient declined  Copy of [place]in Chart? No - copy requested No - copy requested No - copy requested No - copy requested    Current Medications  (verified) Outpatient Encounter Medications as of 07/09/2020  Medication Sig  . aspirin EC 81 MG tablet Take 1 tablet (81 mg total) by mouth daily.  . Biotin (PA BIOTIN) 1000 MCG tablet Take 1,000 mcg by mouth daily.  . Blood Glucose Monitoring Suppl (ONE TOUCH ULTRA 2) w/Device KIT CHECK BLOOD SUGAR TWICE DAILY.  DX CODE  E11.9  . calcium carbonate (OSCAL) 1500 (600 Ca) MG TABS tablet Take 600 mg of elemental calcium by mouth daily with breakfast.  . Cyanocobalamin (VITAMIN B-12 PO) Take 1 tablet by mouth daily.   . furosemide (LASIX) 20 MG tablet Take 0.5 tablets (10 mg total) by mouth daily.  .Marland Kitchenglucose blood (ONE TOUCH ULTRA TEST) test strip CHECK BLOOD SUGAR TWICE DAILY.  DX CODE  E11.9  . JANUVIA 100 MG tablet TAKE ONE TABLET BY MOUTH EVERY DAY  . Lancets (ONETOUCH DELICA PLUS [id number] MISC USE 1 LANCET TO TEST TWICE A DAY (DISCARD LANCET IN SHARPS CONTAINER IMMEDIATELY AFTER USE)  . losartan (COZAAR) 25 MG tablet Take 1 tablet (25 mg total) by mouth daily.  . metFORMIN (GLUCOPHAGE) 850 MG tablet 1 po tid  . nebivolol (BYSTOLIC) 5 MG tablet Take 1 tablet (5 mg total) by mouth daily.  . rosuvastatin (CRESTOR) 20 MG tablet Take 1 tablet (20 mg total) by mouth daily.  .Marland KitchenVITAMIN D, CHOLECALCIFEROL, PO Take 1 tablet by mouth daily.   .Marland Kitchenamoxicillin (AMOXIL) 500 MG capsule Take 500 mg by mouth. For dental procedures (Patient not taking: Reported on 07/09/2020)  . meclizine (ANTIVERT) 25 MG tablet Take 1 tablet (25 mg total) 3 (three) times daily as needed by mouth for dizziness. (Patient not  taking: Reported on 07/09/2020)   No facility-administered encounter medications on file as of 07/09/2020.    Allergies (verified) Mucinex [guaifenesin er], Codeine, Advicor [niacin-lovastatin er], Amlodipine, Lisinopril-hydrochlorothiazide, Other, Ramipril, and Sulfonamide derivatives   History: Past Medical History:  Diagnosis Date  . Diabetes mellitus without complication ([place])   . 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 HYPERTENSION   . MITRAL VALVE PROLAPSE   . OSTEOPENIA   . Overweight(278.02)   . PHARYNGITIS, ACUTE    Past Surgical History:  Procedure Laterality Date  . ABDOMINAL HYSTERECTOMY    . CHOLECYSTECTOMY     Family History  Problem Relation Age of Onset  . Emphysema Father   . Diabetes Father   . Heart disease Mother        Had a stent  . Hypertension Other   . Colon cancer Neg Hx   . BRCA 1/2 Neg Hx   . Breast cancer Neg Hx    Social History   Socioeconomic History  . Marital status: Married    Spouse name: Not on file  . Number of children: Not on file  . Years of education: Not on file  . Highest education level: Not on file  Occupational History  . Occupation: hairdresser  Tobacco Use  . Smoking status: Never Smoker  . Smokeless tobacco: Never Used  Vaping Use  . Vaping Use: Never used  Substance and Sexual Activity  . Alcohol use: No    Alcohol/week: 0.0 standard drinks  . Drug use: No  . Sexual activity: Not Currently  Other Topics Concern  . Not on file  Social History Narrative  . Not on file   Social Determinants of Health   Financial Resource Strain: Low Risk   . Difficulty of Paying Living Expenses: Not hard at all  Food Insecurity: No Food Insecurity  . Worried About Charity fundraiser in the Last Year: Never true  . Ran Out of Food in the Last Year: Never true  Transportation Needs: No Transportation Needs  . Lack of Transportation (Medical): No  . Lack of Transportation (Non-Medical): No  Physical Activity:   . Days of Exercise per Week:   . Minutes of Exercise per Session:   Stress:   . Feeling of Stress :   Social Connections:   . Frequency of Communication with Friends and Family:   . Frequency of Social Gatherings with Friends and Family:   . Attends Religious Services:   . Active Member of Clubs or Organizations:   . Attends Archivist Meetings:   Marland Kitchen Marital Status:     Tobacco  Counseling Counseling given: Not Answered   Clinical Intake: Pain : No/denies pain     Activities of Daily Living In your present state of health, do you have any difficulty performing the following activities: 07/09/2020 07/06/2020  Hearing? N N  Vision? N N  Difficulty concentrating or making decisions? N N  Walking or climbing stairs? N N  Dressing or bathing? N N  Doing errands, shopping? N N  Preparing Food and eating ? N -  Using the Toilet? N -  In the past six months, have you accidently leaked urine? N -  Do you have problems with loss of bowel control? N -  Managing your Medications? N -  Managing your Finances? N -  Housekeeping or managing your Housekeeping? N -  Some recent data might be hidden    Patient Care Team: Etter Sjogren  Koren Shiver, DO as PCP - General Josue Hector, MD as PCP - Cardiology (Cardiology) Shon Hough, MD as Consulting Physician (Ophthalmology) Lafayette Dragon, MD (Inactive) (Gastroenterology) Mcarthur Rossetti, MD as Consulting Physician (Orthopedic Surgery) Druscilla Brownie, MD as Referring Physician (Dermatology)  Indicate any recent Medical Services you may have received from other than [facility] providers in the past year (date may be approximate).     Assessment:   This is a routine wellness examination for [place].  Dietary issues and exercise activities discussed: Current Exercise Habits: The patient does not participate in regular exercise at present, Exercise limited by: None identified Diet (meal preparation, eat out, water intake, caffeinated beverages, dairy products, fruits and vegetables): well balanced    Goals    . Weight (lb) < 135 lb (61.2 kg)      Depression Screen PHQ 2/9 Scores 07/06/2020 06/23/2019 05/25/2018 11/19/2017 09/25/2016 09/13/2015 03/15/2015  PHQ - 2 Score 0 0 0 0 0 0 0    Fall Risk Fall Risk  07/06/2020 06/23/2019 05/25/2018 11/19/2017 09/25/2016  Falls in the past year? 0 0 No No No  Number falls in  past yr: 0 - - - -  Injury with Fall? 0 - - - -  Risk for fall due to : - - - - -  Risk for fall due to: Comment - - - - -  Follow up Falls evaluation completed - - - -   Lives w/ husband in 2 story home.  Any stairs in or around the home? Yes  If so, are there any without handrails? No  Home free of loose throw rugs in walkways, pet beds, electrical cords, etc? Yes  Adequate lighting in your home to reduce risk of falls? Yes   Cognitive Function: Ad8 score reviewed for issues:  Issues making decisions:no  Less interest in hobbies / activities:no  Repeats questions, stories (family complaining):no  Trouble using ordinary gadgets (microwave, computer, phone):no  Forgets the month or year: no  Mismanaging finances: no  Remembering appts:no Daily problems with thinking and/or memory:no Ad8 score is=0     MMSE - Mini Mental State Exam 09/25/2016  Orientation to time 5  Orientation to Place 5  Registration 3  Attention/ Calculation 5  Recall 3  Language- name 2 objects 2  Language- repeat 1  Language- follow 3 step command 3  Language- read & follow direction 1  Write a sentence 1  Copy design 1  Total score 30        Immunizations Immunization History  Administered Date(s) Administered  . Fluad Quad(high Dose 65+) 08/18/2019  . Influenza Whole 10/20/2007  . Influenza, High Dose Seasonal PF 09/01/2016, 09/18/2018  . Influenza,inj,Quad PF,6+ Mos 09/05/2013, 09/05/2014, 09/13/2015  . Influenza-Unspecified 09/21/2017  . PFIZER SARS-COV-2 Vaccination 02/13/2020, 03/05/2020  . Pneumococcal Conjugate-13 02/27/2014  . Pneumococcal Polysaccharide-23 09/06/2011, 07/26/2019  . Td 07/26/2019  . Zoster 09/14/2013  . Zoster Recombinat (Shingrix) 04/12/2017, 06/13/2017    TDAP status: Up to date Flu Vaccine status: Up to date Pneumococcal vaccine status: Up to date Covid-19 vaccine status: Completed vaccines  Qualifies for Shingles Vaccine? Yes   Zostavax completed  Yes   Shingrix Completed?: Yes  Screening Tests Health Maintenance  Topic Date Due  . INFLUENZA VACCINE  07/22/2020  . MAMMOGRAM  10/17/2020  . HEMOGLOBIN A1C  01/06/2021  . OPHTHALMOLOGY EXAM  05/14/2021  . FOOT EXAM  07/06/2021  . COLONOSCOPY  11/09/2023  . TETANUS/TDAP  07/25/2029  . DEXA  SCAN  Completed  . COVID-19 Vaccine  Completed  . Hepatitis C Screening  Completed  . PNA vac Low Risk Adult  Completed    Health Maintenance  There are no preventive care reminders to display for this patient.  Colorectal cancer screening: Completed 11/08/13. Repeat every 10 years Mammogram status: Completed 10/18/19. Repeat every year Bone Density status: Ordered 07/06/20. Pt provided with contact info and advised to call to schedule appt.  Lung Cancer Screening: (Low Dose CT Chest recommended if Age 1-80 years, 30 pack-year currently smoking OR have quit w/in 15years.) does not qualify.    Additional Screening:  Hepatitis C Screening: does qualify; Completed 09/13/15   Vision Screening: Recommended annual ophthalmology exams for early detection of glaucoma and other disorders of the eye. Is the patient up to date with their annual eye exam?  Yes  Who is the provider or what is the name of the office in which the patient attends annual eye exams? Dr.Stoneburner   Dental Screening: Recommended annual dental exams for proper oral hygiene  Community Resource Referral / Chronic Care Management: CRR required this visit?  No   CCM required this visit?  No      Plan:   See you next year!  Continue to eat heart healthy diet (full of fruits, vegetables, whole grains, lean protein, water--limit salt, fat, and sugar intake) and increase physical activity as tolerated.  Continue doing brain stimulating activities (puzzles, reading, adult coloring books, staying active) to keep memory sharp.   Bring a copy of your living will and/or healthcare power of attorney to your next office  visit.   I have personally reviewed and noted the following in the patient's chart:   . Medical and social history . Use of alcohol, tobacco or illicit drugs  . Current medications and supplements . Functional ability and status . Nutritional status . Physical activity . Advanced directives . List of other physicians . Hospitalizations, surgeries, and ER visits in previous 12 months . Vitals . Screenings to include cognitive, depression, and falls . Referrals and appointments  In addition, I have reviewed and discussed with patient certain preventive protocols, quality metrics, and best practice recommendations. A written personalized care plan for preventive services as well as general preventive health recommendations were provided to patient.    Due to this being a telephonic visit, the after visit summary with patients personalized plan was offered to patient via mail or my-chart. Patient would like to access on my-chart.  Shela Nevin, [place]   07/09/2020

## 2020-07-06 NOTE — Patient Instructions (Signed)
Carbohydrate Counting for Diabetes Mellitus, Adult  Carbohydrate counting is a method of keeping track of how many carbohydrates you eat. Eating carbohydrates naturally increases the amount of sugar (glucose) in the blood. Counting how many carbohydrates you eat helps keep your blood glucose within normal limits, which helps you manage your diabetes (diabetes mellitus). It is important to know how many carbohydrates you can safely have in each meal. This is different for every person. A diet and nutrition specialist (registered dietitian) can help you make a meal plan and calculate how many carbohydrates you should have at each meal and snack. Carbohydrates are found in the following foods:  Grains, such as breads and cereals.  Dried beans and soy products.  Starchy vegetables, such as potatoes, peas, and corn.  Fruit and fruit juices.  Milk and yogurt.  Sweets and snack foods, such as cake, cookies, candy, chips, and soft drinks. How do I count carbohydrates? There are two ways to count carbohydrates in food. You can use either of the methods or a combination of both. Reading "Nutrition Facts" on packaged food The "Nutrition Facts" list is included on the labels of almost all packaged foods and beverages in the [place] It includes:  The serving size.  Information about nutrients in each serving, including the grams (g) of carbohydrate per serving. To use the "Nutrition Facts":  Decide how many servings you will have.  Multiply the number of servings by the number of carbohydrates per serving.  The resulting number is the total amount of carbohydrates that you will be having. Learning standard serving sizes of other foods When you eat carbohydrate foods that are not packaged or do not include "Nutrition Facts" on the label, you need to measure the servings in order to count the amount of carbohydrates:  Measure the foods that you will eat with a food scale or measuring cup, if  needed.  Decide how many standard-size servings you will eat.  Multiply the number of servings by 15. Most carbohydrate-rich foods have about 15 g of carbohydrates per serving. ? For example, if you eat 8 oz (170 g) of strawberries, you will have eaten 2 servings and 30 g of carbohydrates (2 servings x 15 g = 30 g).  For foods that have more than one food mixed, such as soups and casseroles, you must count the carbohydrates in each food that is included. The following list contains standard serving sizes of common carbohydrate-rich foods. Each of these servings has about 15 g of carbohydrates:   hamburger bun or  English muffin.   oz (15 mL) syrup.   oz (14 g) jelly.  1 slice of bread.  1 six-inch tortilla.  3 oz (85 g) cooked rice or pasta.  4 oz (113 g) cooked dried beans.  4 oz (113 g) starchy vegetable, such as peas, corn, or potatoes.  4 oz (113 g) hot cereal.  4 oz (113 g) mashed potatoes or  of a large baked potato.  4 oz (113 g) canned or frozen fruit.  4 oz (120 mL) fruit juice.  4-6 crackers.  6 chicken nuggets.  6 oz (170 g) unsweetened dry cereal.  6 oz (170 g) plain fat-free yogurt or yogurt sweetened with artificial sweeteners.  8 oz (240 mL) milk.  8 oz (170 g) fresh fruit or one small piece of fruit.  24 oz (680 g) popped popcorn. Example of carbohydrate counting Sample meal  3 oz (85 g) chicken breast.  6 oz (170 g)   brown rice.  4 oz (113 g) corn.  8 oz (240 mL) milk.  8 oz (170 g) strawberries with sugar-free whipped topping. Carbohydrate calculation 1. Identify the foods that contain carbohydrates: ? Rice. ? Corn. ? Milk. ? Strawberries. 2. Calculate how many servings you have of each food: ? 2 servings rice. ? 1 serving corn. ? 1 serving milk. ? 1 serving strawberries. 3. Multiply each number of servings by 15 g: ? 2 servings rice x 15 g = 30 g. ? 1 serving corn x 15 g = 15 g. ? 1 serving milk x 15 g = 15 g. ? 1  serving strawberries x 15 g = 15 g. 4. Add together all of the amounts to find the total grams of carbohydrates eaten: ? 30 g + 15 g + 15 g + 15 g = 75 g of carbohydrates total. Summary  Carbohydrate counting is a method of keeping track of how many carbohydrates you eat.  Eating carbohydrates naturally increases the amount of sugar (glucose) in the blood.  Counting how many carbohydrates you eat helps keep your blood glucose within normal limits, which helps you manage your diabetes.  A diet and nutrition specialist (registered dietitian) can help you make a meal plan and calculate how many carbohydrates you should have at each meal and snack. This information is not intended to replace advice given to you by your health care provider. Make sure you discuss any questions you have with your health care provider. Document Revised: 07/02/2017 Document Reviewed: 05/21/2016 Elsevier Patient Education  2020 [employer]  

## 2020-07-06 NOTE — Assessment & Plan Note (Signed)
hgba1c to be checked, minimize simple carbs. Increase exercise as tolerated. Continue current meds  

## 2020-07-06 NOTE — Progress Notes (Signed)
[name], female    DOB: [birth year]  Age: 74 y.o. MRN: [id number]    Subjective:  Subjective  HPI Cynthia Burns presents for f/u  No complaints  HYPERTENSION   Blood pressure range-not checking   Chest pain- no      Dyspnea- no Lightheadedness- no   Edema- no  Other side effects - no   Medication compliance: good Low salt diet- yes    DIABETES    Blood Sugar ranges-135 this am  Polyuria- mo New Visual problems- no Hypoglycemic symptoms- no  Other side effects-no Medication compliance - good Last eye exam- 05/13/3020 Foot exam- today   HYPERLIPIDEMIA  Medication compliance- good RUQ pain- no  Muscle aches- no Other side effects-no      Review of Systems  Constitutional: Negative for appetite change, diaphoresis, fatigue and unexpected weight change.  Eyes: Negative for pain, redness and visual disturbance.  Respiratory: Negative for cough, chest tightness, shortness of breath and wheezing.   Cardiovascular: Negative for chest pain, palpitations and leg swelling.  Endocrine: Negative for cold intolerance, heat intolerance, polydipsia, polyphagia and polyuria.  Genitourinary: Negative for difficulty urinating, dysuria and frequency.  Neurological: Negative for dizziness, light-headedness, numbness and headaches.    History Past Medical History:  Diagnosis Date  . Diabetes mellitus without complication ([place])   . 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 HYPERTENSION   . MITRAL VALVE PROLAPSE   . OSTEOPENIA   . Overweight(278.02)   . PHARYNGITIS, ACUTE     She has a past surgical history that includes Abdominal hysterectomy and Cholecystectomy.   Her family history includes Diabetes in her father; Emphysema in her father; Heart disease in her mother; Hypertension in an other family member.She reports that she has never smoked. She has never used smokeless tobacco. She reports that she does not drink alcohol and does not use drugs.  Current  Outpatient Medications on File Prior to Visit  Medication Sig Dispense Refill  . amoxicillin (AMOXIL) 500 MG capsule Take 500 mg by mouth. For dental procedures    . aspirin EC 81 MG tablet Take 1 tablet (81 mg total) by mouth daily. 90 tablet 3  . Biotin (PA BIOTIN) 1000 MCG tablet Take 1,000 mcg by mouth daily.    . Blood Glucose Monitoring Suppl (ONE TOUCH ULTRA 2) w/Device KIT CHECK BLOOD SUGAR TWICE DAILY.  DX CODE  E11.9 1 kit 0  . calcium carbonate (OSCAL) 1500 (600 Ca) MG TABS tablet Take 600 mg of elemental calcium by mouth daily with breakfast.    . Cyanocobalamin (VITAMIN B-12 PO) Take 1 tablet by mouth daily.     . furosemide (LASIX) 20 MG tablet Take 0.5 tablets (10 mg total) by mouth daily. 45 tablet 3  . glucose blood (ONE TOUCH ULTRA TEST) test strip CHECK BLOOD SUGAR TWICE DAILY.  DX CODE  E11.9 200 each 1  . JANUVIA 100 MG tablet TAKE ONE TABLET BY MOUTH EVERY DAY 90 tablet 1  . Lancets (ONETOUCH DELICA PLUS [id number]) MISC USE 1 LANCET TO TEST TWICE A DAY (DISCARD LANCET IN SHARPS CONTAINER IMMEDIATELY AFTER USE) 200 each 1  . losartan (COZAAR) 25 MG tablet Take 1 tablet (25 mg total) by mouth daily. 90 tablet 3  . meclizine (ANTIVERT) 25 MG tablet Take 1 tablet (25 mg total) 3 (three) times daily as needed by mouth for dizziness. 90 tablet 3  . metFORMIN (GLUCOPHAGE) 850 MG tablet 1 po tid 270  tablet 1  . nebivolol (BYSTOLIC) 5 MG tablet Take 1 tablet (5 mg total) by mouth daily. 90 tablet 3  . rosuvastatin (CRESTOR) 20 MG tablet Take 1 tablet (20 mg total) by mouth daily. 90 tablet 3  . VITAMIN D, CHOLECALCIFEROL, PO Take 1 tablet by mouth daily.      No current facility-administered medications on file prior to visit.     Objective:  Objective  Physical Exam Vitals and nursing note reviewed.  Constitutional:      Appearance: She is well-developed.  HENT:     Head: Normocephalic and atraumatic.     Right Ear: Decreased hearing noted. There is impacted cerumen.      Left Ear: Decreased hearing noted. There is no impacted cerumen.  Eyes:     Conjunctiva/sclera: Conjunctivae normal.  Neck:     Thyroid: No thyromegaly.     Vascular: No carotid bruit or JVD.  Cardiovascular:     Rate and Rhythm: Normal rate and regular rhythm.     Heart sounds: Normal heart sounds. No murmur heard.   Pulmonary:     Effort: Pulmonary effort is normal. No respiratory distress.     Breath sounds: Normal breath sounds. No wheezing or rales.  Chest:     Chest wall: No tenderness.  Musculoskeletal:     Cervical back: Normal range of motion and neck supple.  Neurological:     Mental Status: She is alert and oriented to person, place, and time.    Diabetic Foot Exam - Simple   Simple Foot Form Diabetic Foot exam was performed with the following findings: Yes 07/06/2020  9:11 AM  Visual Inspection No deformities, no ulcerations, no other skin breakdown bilaterally: Yes Sensation Testing Intact to touch and monofilament testing bilaterally: Yes Pulse Check Posterior Tibialis and Dorsalis pulse intact bilaterally: Yes Comments     BP 126/82 (BP Location: Left Arm, Patient Position: Sitting, Cuff Size: Normal)   Pulse 61   Temp 97.7 F (36.5 C) (Temporal)   Resp 18   Ht '5\' 2"'  (1.575 m)   Wt 138 lb 3.2 oz (62.7 kg)   SpO2 97%   BMI 25.28 kg/m  Wt Readings from Last 3 Encounters:  07/06/20 138 lb 3.2 oz (62.7 kg)  12/26/19 139 lb 12.8 oz (63.4 kg)  11/30/19 137 lb 3.2 oz (62.2 kg)     Lab Results  Component Value Date   WBC 6.0 09/25/2016   HGB 13.0 09/25/2016   HCT 38.8 09/25/2016   PLT 257.0 09/25/2016   GLUCOSE 129 (H) 12/26/2019   CHOL 128 12/26/2019   TRIG 152.0 (H) 12/26/2019   HDL 45.90 12/26/2019   LDLDIRECT 53.0 05/25/2018   LDLCALC 52 12/26/2019   ALT 17 12/26/2019   AST 17 12/26/2019   NA 141 12/26/2019   K 3.7 12/26/2019   CL 102 12/26/2019   CREATININE 0.83 12/26/2019   BUN 15 12/26/2019   CO2 30 12/26/2019   HGBA1C 6.9 (H)  12/26/2019   MICROALBUR 1.7 12/26/2019    MM 3D SCREEN BREAST BILATERAL  Result Date: 10/18/2019 CLINICAL DATA:  Screening. EXAM: DIGITAL SCREENING BILATERAL MAMMOGRAM WITH TOMO AND CAD COMPARISON:  Previous exam(s). ACR Breast Density Category b: There are scattered areas of fibroglandular density. FINDINGS: There are no findings suspicious for malignancy. Images were processed with CAD. IMPRESSION: No mammographic evidence of malignancy. A result letter of this screening mammogram will be mailed directly to the patient. RECOMMENDATION: Screening mammogram in one year. (Code:SM-B-01Y) BI-RADS  CATEGORY  1: Negative. Electronically Signed   By: Ammie Ferrier M.D.   On: 10/18/2019 11:03     Assessment & Plan:  Plan  I am having Carlene Coria maintain her aspirin EC, Cyanocobalamin (VITAMIN B-12 PO), (VITAMIN D, CHOLECALCIFEROL, PO), Biotin, meclizine, amoxicillin, calcium carbonate, ONE TOUCH ULTRA 2, rosuvastatin, losartan, nebivolol, furosemide, metFORMIN, glucose blood, OneTouch Delica Plus [id number], and Januvia.  No orders of the defined types were placed in this encounter.   Problem List Items Addressed This Visit      Unprioritized   Essential hypertension    Well controlled, no changes to meds. Encouraged heart healthy diet such as the DASH diet and exercise as tolerated.       Relevant Orders   Microalbumin / creatinine urine ratio   Hyperlipidemia LDL goal <70    Tolerating statin, encouraged heart healthy diet, avoid trans fats, minimize simple carbs and saturated fats. Increase exercise as tolerated      Impacted cerumen of right ear    Pt to use debrox  And rto for irrigation / hearing test        Type 2 diabetes mellitus not at goal [facility])    hgba1c to be checked , minimize simple carbs. Increase exercise as tolerated. Continue current meds        Other Visit Diagnoses    Uncontrolled type 2 diabetes mellitus with hyperglycemia ([place])    -  Primary   Relevant  Orders   Hemoglobin A1c   Comprehensive metabolic panel   Microalbumin / creatinine urine ratio   Hyperlipidemia associated with type 2 diabetes mellitus ([place])       Relevant Orders   Lipid panel   Microalbumin / creatinine urine ratio   Estrogen deficiency       Relevant Orders   DG Bone Density      Follow-up: Return in about 6 months (around 01/06/2021), or if symptoms worsen or fail to improve, for hypertension, hyperlipidemia, diabetes II.  Ann Held, DO

## 2020-07-06 NOTE — Assessment & Plan Note (Signed)
Well controlled, no changes to meds. Encouraged heart healthy diet such as the DASH diet and exercise as tolerated.  °

## 2020-07-08 ENCOUNTER — Other Ambulatory Visit: Payer: Self-pay | Admitting: Family Medicine

## 2020-07-08 DIAGNOSIS — E1165 Type 2 diabetes mellitus with hyperglycemia: Secondary | ICD-10-CM

## 2020-07-08 DIAGNOSIS — I10 Essential (primary) hypertension: Secondary | ICD-10-CM

## 2020-07-09 ENCOUNTER — Encounter: Payer: Self-pay | Admitting: *Deleted

## 2020-07-09 ENCOUNTER — Other Ambulatory Visit: Payer: Self-pay

## 2020-07-09 ENCOUNTER — Ambulatory Visit (INDEPENDENT_AMBULATORY_CARE_PROVIDER_SITE_OTHER): Payer: Medicare Other | Admitting: *Deleted

## 2020-07-09 VITALS — BP 129/66 | HR 58

## 2020-07-09 DIAGNOSIS — Z0000 Encounter for general adult medical examination without abnormal findings: Secondary | ICD-10-CM

## 2020-07-09 NOTE — Patient Instructions (Signed)
Cynthia Burns , Thank you for taking time to come for your Medicare Wellness Visit. I appreciate your ongoing commitment to your health goals. Please review the following plan we discussed and let me know if I can assist you in the future.   Screening recommendations/referrals: Colorectal cancer screening: Completed 11/08/13. Repeat every 10 years Mammogram status: Completed 10/18/19. Repeat every year Bone Density status: Ordered 07/06/20. Pt provided with contact info and advised to call to schedule appt. Recommended yearly ophthalmology/optometry visit for glaucoma screening and checkup Recommended yearly dental visit for hygiene and checkup  Vaccinations: TDAP status: Up to date Flu Vaccine status: Up to date Pneumococcal vaccine status: Up to date Covid-19 vaccine status: Completed vaccines  Qualifies for Shingles Vaccine? Yes   Zostavax completed Yes   Shingrix Completed?: Yes  Advanced directives: Bring a copy of your living will and/or healthcare power of attorney to your next office visit.   Next appointment: Follow up in one year for your annual wellness visit 07/10/21 @1015 .   Preventive Care 73 Years and Older, Female Preventive care refers to lifestyle choices and visits with your health care provider that can promote health and wellness. What does preventive care include?  A yearly physical exam. This is also called an annual well check.  Dental exams once or twice a year.  Routine eye exams. Ask your health care provider how often you should have your eyes checked.  Personal lifestyle choices, including:  Daily care of your teeth and gums.  Regular physical activity.  Eating a healthy diet.  Avoiding tobacco and drug use.  Limiting alcohol use.  Practicing safe sex.  Taking low-dose aspirin every day.  Taking vitamin and mineral supplements as recommended by your health care provider. What happens during an annual well check? The services and screenings  done by your health care provider during your annual well check will depend on your age, overall health, lifestyle risk factors, and family history of disease. Counseling  Your health care provider may ask you questions about your:  Alcohol use.  Tobacco use.  Drug use.  Emotional well-being.  Home and relationship well-being.  Sexual activity.  Eating habits.  History of falls.  Memory and ability to understand (cognition).  Work and work Statistician.  Reproductive health. Screening  You may have the following tests or measurements:  Height, weight, and BMI.  Blood pressure.  Lipid and cholesterol levels. These may be checked every 5 years, or more frequently if you are over 73 years old.  Skin check.  Lung cancer screening. You may have this screening every year starting at age 73 if you have a 30-pack-year history of smoking and currently smoke or have quit within the past 15 years.  Fecal occult blood test (FOBT) of the stool. You may have this test every year starting at age 73.  Flexible sigmoidoscopy or colonoscopy. You may have a sigmoidoscopy every 5 years or a colonoscopy every 10 years starting at age 73.  Hepatitis C blood test.  Hepatitis B blood test.  Sexually transmitted disease (STD) testing.  Diabetes screening. This is done by checking your blood sugar (glucose) after you have not eaten for a while (fasting). You may have this done every 1-3 years.  Bone density scan. This is done to screen for osteoporosis. You may have this done starting at age 73.  Mammogram. This may be done every 1-2 years. Talk to your health care provider about how often you should have regular mammograms. Talk  with your health care provider about your test results, treatment options, and if necessary, the need for more tests. Vaccines  Your health care provider may recommend certain vaccines, such as:  Influenza vaccine. This is recommended every year.  Tetanus,  diphtheria, and acellular pertussis (Tdap, Td) vaccine. You may need a Td booster every 10 years.  Zoster vaccine. You may need this after age 73.  Pneumococcal 13-valent conjugate (PCV13) vaccine. One dose is recommended after age 10.  Pneumococcal polysaccharide (PPSV23) vaccine. One dose is recommended after age 3. Talk to your health care provider about which screenings and vaccines you need and how often you need them. This information is not intended to replace advice given to you by your health care provider. Make sure you discuss any questions you have with your health care provider. Document Released: 01/04/2016 Document Revised: 08/27/2016 Document Reviewed: 10/09/2015 Elsevier Interactive Patient Education  2017 [place] Prevention in the Home Falls can cause injuries. They can happen to people of all ages. There are many things you can do to make your home safe and to help prevent falls. What can I do on the outside of my home?  Regularly fix the edges of walkways and driveways and fix any cracks.  Remove anything that might make you trip as you walk through a door, such as a raised step or threshold.  Trim any bushes or trees on the path to your home.  Use bright outdoor lighting.  Clear any walking paths of anything that might make someone trip, such as rocks or tools.  Regularly check to see if handrails are loose or broken. Make sure that both sides of any steps have handrails.  Any raised decks and porches should have guardrails on the edges.  Have any leaves, snow, or ice cleared regularly.  Use sand or salt on walking paths during winter.  Clean up any spills in your garage right away. This includes oil or grease spills. What can I do in the bathroom?  Use night lights.  Install grab bars by the toilet and in the tub and shower. Do not use towel bars as grab bars.  Use non-skid mats or decals in the tub or shower.  If you need to sit down in  the shower, use a plastic, non-slip stool.  Keep the floor dry. Clean up any water that spills on the floor as soon as it happens.  Remove soap buildup in the tub or shower regularly.  Attach bath mats securely with double-sided non-slip rug tape.  Do not have throw rugs and other things on the floor that can make you trip. What can I do in the bedroom?  Use night lights.  Make sure that you have a light by your bed that is easy to reach.  Do not use any sheets or blankets that are too big for your bed. They should not hang down onto the floor.  Have a firm chair that has side arms. You can use this for support while you get dressed.  Do not have throw rugs and other things on the floor that can make you trip. What can I do in the kitchen?  Clean up any spills right away.  Avoid walking on wet floors.  Keep items that you use a lot in easy-to-reach places.  If you need to reach something above you, use a strong step stool that has a grab bar.  Keep electrical cords out of the way.  Do  not use floor polish or wax that makes floors slippery. If you must use wax, use non-skid floor wax.  Do not have throw rugs and other things on the floor that can make you trip. What can I do with my stairs?  Do not leave any items on the stairs.  Make sure that there are handrails on both sides of the stairs and use them. Fix handrails that are broken or loose. Make sure that handrails are as long as the stairways.  Check any carpeting to make sure that it is firmly attached to the stairs. Fix any carpet that is loose or worn.  Avoid having throw rugs at the top or bottom of the stairs. If you do have throw rugs, attach them to the floor with carpet tape.  Make sure that you have a light switch at the top of the stairs and the bottom of the stairs. If you do not have them, ask someone to add them for you. What else can I do to help prevent falls?  Wear shoes that:  Do not have high  heels.  Have rubber bottoms.  Are comfortable and fit you well.  Are closed at the toe. Do not wear sandals.  If you use a stepladder:  Make sure that it is fully opened. Do not climb a closed stepladder.  Make sure that both sides of the stepladder are locked into place.  Ask someone to hold it for you, if possible.  Clearly mark and make sure that you can see:  Any grab bars or handrails.  First and last steps.  Where the edge of each step is.  Use tools that help you move around (mobility aids) if they are needed. These include:  Canes.  Walkers.  Scooters.  Crutches.  Turn on the lights when you go into a dark area. Replace any light bulbs as soon as they burn out.  Set up your furniture so you have a clear path. Avoid moving your furniture around.  If any of your floors are uneven, fix them.  If there are any pets around you, be aware of where they are.  Review your medicines with your doctor. Some medicines can make you feel dizzy. This can increase your chance of falling. Ask your doctor what other things that you can do to help prevent falls. This information is not intended to replace advice given to you by your health care provider. Make sure you discuss any questions you have with your health care provider. Document Released: 10/04/2009 Document Revised: 05/15/2016 Document Reviewed: 01/12/2015 Elsevier Interactive Patient Education  2017 [employer].

## 2020-07-11 ENCOUNTER — Telehealth: Payer: Self-pay | Admitting: Family Medicine

## 2020-07-11 ENCOUNTER — Other Ambulatory Visit: Payer: Self-pay

## 2020-07-11 DIAGNOSIS — E1165 Type 2 diabetes mellitus with hyperglycemia: Secondary | ICD-10-CM

## 2020-07-11 DIAGNOSIS — IMO0002 Reserved for concepts with insufficient information to code with codable children: Secondary | ICD-10-CM

## 2020-07-11 MED ORDER — GLUCOSE BLOOD VI STRP: ORAL_STRIP | 1 refills | Status: DC

## 2020-07-11 MED ORDER — ONETOUCH DELICA PLUS LANCET33G MISC: 1 refills | Status: DC

## 2020-07-11 MED ORDER — METFORMIN HCL 850 MG PO TABS: ORAL_TABLET | ORAL | 1 refills | Status: DC

## 2020-07-11 MED ORDER — RYBELSUS 3 MG PO TABS: 3.0000 mg | ORAL_TABLET | Freq: Every day | ORAL | 0 refills | Status: DC

## 2020-07-11 NOTE — Telephone Encounter (Signed)
Detailed VM left with below recommendation. Rx sent

## 2020-07-11 NOTE — Telephone Encounter (Signed)
Stop [employer] rybelsus 3 mg daily for 1 month --- next month we will increase to 7 mg

## 2020-07-11 NOTE — Telephone Encounter (Signed)
Refills sent. Please advise on Rybelsus

## 2020-07-11 NOTE — Telephone Encounter (Signed)
Patient needs a refill on her Metformin, lancets, and test strips. She also said that [facility], [place]) does have Rebelsus and they carry it in 3mg , 7mg , and 14mg . She asked if she starts the Rebelsus will she be discontinuing another medication. Please call patient back at [phone] if you have any questions.  Thank you

## 2020-07-12 ENCOUNTER — Other Ambulatory Visit: Payer: Self-pay | Admitting: Family Medicine

## 2020-07-12 DIAGNOSIS — Z1231 Encounter for screening mammogram for malignant neoplasm of breast: Secondary | ICD-10-CM

## 2020-07-25 DIAGNOSIS — Z23 Encounter for immunization: Secondary | ICD-10-CM | POA: Diagnosis not present

## 2020-08-10 ENCOUNTER — Telehealth: Payer: Self-pay | Admitting: Family Medicine

## 2020-08-10 NOTE — Telephone Encounter (Signed)
Okay to order the 7 MG Rybelsus?

## 2020-08-10 NOTE — Telephone Encounter (Signed)
Caller Santino  Call Back # [phone]  Patient states Dr.Lowne was going to call in or send  Pharmacy a higher does on 7mg  of:   Semaglutide (RYBELSUS) 3 MG TABS  Pharmacy :  [facility] [place], [place] [address]  [address], [place] [address]  Phone:  [phone] Fax:  [phone]  DEA #:  --

## 2020-08-10 NOTE — Telephone Encounter (Signed)
Yes--#90

## 2020-08-13 MED ORDER — RYBELSUS 7 MG PO TABS: 1.0000 | ORAL_TABLET | Freq: Every day | ORAL | 1 refills | Status: DC

## 2020-08-13 NOTE — Telephone Encounter (Signed)
Medication sent to pharmacy  

## 2020-08-24 ENCOUNTER — Other Ambulatory Visit: Payer: Self-pay | Admitting: Family Medicine

## 2020-08-24 ENCOUNTER — Other Ambulatory Visit: Payer: Self-pay | Admitting: Cardiovascular Disease

## 2020-08-24 DIAGNOSIS — I10 Essential (primary) hypertension: Secondary | ICD-10-CM

## 2020-08-24 DIAGNOSIS — E119 Type 2 diabetes mellitus without complications: Secondary | ICD-10-CM

## 2020-08-30 DIAGNOSIS — Z23 Encounter for immunization: Secondary | ICD-10-CM | POA: Diagnosis not present

## 2020-10-02 ENCOUNTER — Other Ambulatory Visit: Payer: Self-pay

## 2020-10-02 ENCOUNTER — Other Ambulatory Visit (INDEPENDENT_AMBULATORY_CARE_PROVIDER_SITE_OTHER): Payer: Medicare Other

## 2020-10-02 DIAGNOSIS — E1165 Type 2 diabetes mellitus with hyperglycemia: Secondary | ICD-10-CM

## 2020-10-02 DIAGNOSIS — I10 Essential (primary) hypertension: Secondary | ICD-10-CM

## 2020-10-02 NOTE — Addendum Note (Signed)
Addended by: Kelle Darting A on: 10/02/2020 07:31 AM   Modules accepted: Orders

## 2020-10-03 LAB — COMPREHENSIVE METABOLIC PANEL
AG Ratio: 1.9 (calc) (ref 1.0–2.5)
ALT: 17 U/L (ref 6–29)
AST: 21 U/L (ref 10–35)
Albumin: 4.3 g/dL (ref 3.6–5.1)
Alkaline phosphatase (APISO): 33 U/L — ABNORMAL LOW (ref 37–153)
BUN: 13 mg/dL (ref 7–25)
CO2: 26 mmol/L (ref 20–32)
Calcium: 9.5 mg/dL (ref 8.6–10.4)
Chloride: 104 mmol/L (ref 98–110)
Creat: 0.81 mg/dL (ref 0.60–0.93)
Globulin: 2.3 g/dL (calc) (ref 1.9–3.7)
Glucose, Bld: 103 mg/dL — ABNORMAL HIGH (ref 65–99)
Potassium: 4.2 mmol/L (ref 3.5–5.3)
Sodium: 140 mmol/L (ref 135–146)
Total Bilirubin: 0.6 mg/dL (ref 0.2–1.2)
Total Protein: 6.6 g/dL (ref 6.1–8.1)

## 2020-10-03 LAB — HEMOGLOBIN A1C
Hgb A1c MFr Bld: 6.4 % of total Hgb — ABNORMAL HIGH (ref <5.7)
Mean Plasma Glucose: 137 (calc)
eAG (mmol/L): 7.6 (calc)

## 2020-10-03 LAB — LIPID PANEL
Cholesterol: 102 mg/dL (ref <200)
HDL: 36 mg/dL — ABNORMAL LOW (ref >=50)
LDL Cholesterol (Calc): 45 mg/dL (calc)
Non-HDL Cholesterol (Calc): 66 mg/dL (calc) (ref <130)
Total CHOL/HDL Ratio: 2.8 (calc) (ref <5.0)
Triglycerides: 124 mg/dL (ref <150)

## 2020-10-09 ENCOUNTER — Other Ambulatory Visit: Payer: Medicare Other

## 2020-10-16 ENCOUNTER — Other Ambulatory Visit: Payer: Self-pay | Admitting: Cardiovascular Disease

## 2020-10-16 ENCOUNTER — Other Ambulatory Visit: Payer: Self-pay | Admitting: Family Medicine

## 2020-10-16 DIAGNOSIS — IMO0002 Reserved for concepts with insufficient information to code with codable children: Secondary | ICD-10-CM

## 2020-10-16 DIAGNOSIS — E1165 Type 2 diabetes mellitus with hyperglycemia: Secondary | ICD-10-CM

## 2020-10-16 DIAGNOSIS — I10 Essential (primary) hypertension: Secondary | ICD-10-CM

## 2020-10-16 DIAGNOSIS — E1151 Type 2 diabetes mellitus with diabetic peripheral angiopathy without gangrene: Secondary | ICD-10-CM

## 2020-10-16 DIAGNOSIS — E1139 Type 2 diabetes mellitus with other diabetic ophthalmic complication: Secondary | ICD-10-CM

## 2020-10-18 ENCOUNTER — Other Ambulatory Visit: Payer: Self-pay

## 2020-10-18 ENCOUNTER — Ambulatory Visit
Admission: RE | Admit: 2020-10-18 | Discharge: 2020-10-18 | Disposition: A | Discharge status: Home / Self Care | Payer: Medicare Other | Source: Ambulatory Visit | Attending: Family Medicine | Admitting: Family Medicine

## 2020-10-18 DIAGNOSIS — Z780 Asymptomatic menopausal state: Secondary | ICD-10-CM | POA: Diagnosis not present

## 2020-10-18 DIAGNOSIS — Z1231 Encounter for screening mammogram for malignant neoplasm of breast: Secondary | ICD-10-CM | POA: Diagnosis not present

## 2020-10-18 DIAGNOSIS — E2839 Other primary ovarian failure: Secondary | ICD-10-CM

## 2020-10-18 MED ORDER — FUROSEMIDE 20 MG PO TABS: 10.0000 mg | ORAL_TABLET | Freq: Every day | ORAL | 0 refills | Status: DC

## 2020-10-18 MED ORDER — NEBIVOLOL HCL 5 MG PO TABS: 5.0000 mg | ORAL_TABLET | Freq: Every day | ORAL | 0 refills | Status: DC

## 2020-10-18 MED ORDER — ROSUVASTATIN CALCIUM 20 MG PO TABS: 20.0000 mg | ORAL_TABLET | Freq: Every day | ORAL | 0 refills | Status: DC

## 2020-11-06 DIAGNOSIS — Z23 Encounter for immunization: Secondary | ICD-10-CM | POA: Diagnosis not present

## 2020-11-29 NOTE — Progress Notes (Signed)
[name], female   DOB: Apr 28, 1946, 74 y.o.   MRN: [id number]     [name] returns today for followup. 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  All other systems reviewed and negative  General: BP 140/80   Pulse 72   Ht _0  (1.575 m)   Wt 58.9 kg   SpO2 98%   BMI 23.74 kg/m  Affect appropriate Healthy:  appears stated age 74: normal Neck supple with no adenopathy JVP normal no bruits no thyromegaly Lungs clear with no wheezing and good diaphragmatic motion Heart:  S1/S2 no murmur, no rub, gallop or click PMI normal Abdomen: benighn, BS positve, no tenderness, no AAA no bruit.  No HSM or HJR Distal pulses intact with no bruits No edema Neuro non-focal Skin warm and dry No muscular weakness    Current Outpatient Medications  Medication Sig Dispense Refill  . amoxicillin (AMOXIL) 500 MG capsule Take 500 mg by mouth. For dental procedures    . aspirin EC 81 MG tablet Take 1 tablet (81 mg total) by mouth daily. 90 tablet 3  . Biotin 1000 MCG tablet Take 1,000 mcg by mouth daily.    . Blood Glucose Monitoring Suppl (ONE TOUCH ULTRA 2) w/Device KIT CHECK BLOOD SUGAR TWICE DAILY.  DX CODE  E11.9 1 kit 0  . calcium carbonate (OSCAL) 1500 (600 Ca) MG TABS tablet Take 600 mg of  elemental calcium by mouth daily with breakfast.    . Cyanocobalamin (VITAMIN B-12 PO) Take 1 tablet by mouth daily.     . furosemide (LASIX) 20 MG tablet Take 0.5 tablets (10 mg total) by mouth daily. Please keep upcoming appt in December with Dr. Johnsie Cancel before anymore refills. Thank you 45 tablet 0  . Lancets (ONETOUCH DELICA PLUS [id number]) MISC USE 1 LANCET TO TEST TWICE A DAY (DISCARD LANCET IN SHARPS CONTAINER IMMEDIATELY AFTER USE) 200 each 1  . losartan (COZAAR) 25 MG tablet TAKE ONE TABLET BY MOUTH EVERY DAY 90 tablet 0  . meclizine (ANTIVERT) 25 MG tablet Take 1 tablet (25 mg total) 3 (three) times daily as needed by mouth for dizziness. 90 tablet 3  . metFORMIN (GLUCOPHAGE) 850 MG tablet TAKE ONE TABLET BY MOUTH THREE TIMES A DAY WITH FOOD 270 tablet 1  . nebivolol (BYSTOLIC) 5 MG tablet Take 1 tablet (5 mg total) by mouth daily. Please keep upcoming appt in December with Dr. Johnsie Cancel before anymore refills. Thank you 90 tablet 0  . ONETOUCH ULTRA test strip USE ONE STRIP TO TEST TWICE A DAY - (KEEP UNUSED STRIPS IN ORIGINAL SEALED CONTAINER BETWEEN USES) 200 each 1  . rosuvastatin (CRESTOR) 20 MG tablet Take 1 tablet (20 mg total) by mouth daily. Please keep upcoming appt in December  with Dr. Johnsie Cancel before anymore refills. Thank you 90 tablet 0  . Semaglutide (RYBELSUS) 7 MG TABS Take 1 tablet by mouth daily. 90 tablet 1  . VITAMIN D, CHOLECALCIFEROL, PO Take 1 tablet by mouth daily.      No current facility-administered medications for this visit.    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  Target hemoglobin A1c is 6.5 or less.  Continue current medications. Obesity: Exercise and low carb  diet discussed Chol: on statin LDL 45 10/02/20  HTN:  Well controlled.  Continue current medications and low sodium Dash type diet.   Edema: stable low dose lasix low sodium diet  GB: post lap choly with improved symptoms    Cynthia Burns

## 2020-12-03 ENCOUNTER — Other Ambulatory Visit: Payer: Self-pay

## 2020-12-03 ENCOUNTER — Encounter: Payer: Self-pay | Admitting: Cardiovascular Disease

## 2020-12-03 ENCOUNTER — Ambulatory Visit (INDEPENDENT_AMBULATORY_CARE_PROVIDER_SITE_OTHER): Payer: Medicare Other | Admitting: Cardiovascular Disease

## 2020-12-03 VITALS — BP 140/80 | HR 72 | Ht 62.0 in | Wt 129.8 lb

## 2020-12-03 DIAGNOSIS — I10 Essential (primary) hypertension: Secondary | ICD-10-CM | POA: Diagnosis not present

## 2020-12-03 NOTE — Patient Instructions (Addendum)

## 2021-01-01 ENCOUNTER — Other Ambulatory Visit: Payer: Self-pay | Admitting: Family Medicine

## 2021-01-01 ENCOUNTER — Other Ambulatory Visit: Payer: Self-pay | Admitting: Cardiovascular Disease

## 2021-01-01 DIAGNOSIS — E1151 Type 2 diabetes mellitus with diabetic peripheral angiopathy without gangrene: Secondary | ICD-10-CM

## 2021-01-01 DIAGNOSIS — IMO0002 Reserved for concepts with insufficient information to code with codable children: Secondary | ICD-10-CM

## 2021-01-01 DIAGNOSIS — I10 Essential (primary) hypertension: Secondary | ICD-10-CM

## 2021-01-01 DIAGNOSIS — E1139 Type 2 diabetes mellitus with other diabetic ophthalmic complication: Secondary | ICD-10-CM

## 2021-01-07 ENCOUNTER — Ambulatory Visit: Payer: Medicare Other | Admitting: Family Medicine

## 2021-01-07 ENCOUNTER — Telehealth (INDEPENDENT_AMBULATORY_CARE_PROVIDER_SITE_OTHER): Payer: Medicare Other | Admitting: Family Medicine

## 2021-01-07 ENCOUNTER — Encounter: Payer: Self-pay | Admitting: Family Medicine

## 2021-01-07 VITALS — BP 161/80 | HR 77 | Wt 127.4 lb

## 2021-01-07 DIAGNOSIS — E1139 Type 2 diabetes mellitus with other diabetic ophthalmic complication: Secondary | ICD-10-CM | POA: Diagnosis not present

## 2021-01-07 DIAGNOSIS — E785 Hyperlipidemia, unspecified: Secondary | ICD-10-CM

## 2021-01-07 DIAGNOSIS — E1169 Type 2 diabetes mellitus with other specified complication: Secondary | ICD-10-CM

## 2021-01-07 DIAGNOSIS — E1165 Type 2 diabetes mellitus with hyperglycemia: Secondary | ICD-10-CM

## 2021-01-07 DIAGNOSIS — E559 Vitamin D deficiency, unspecified: Secondary | ICD-10-CM | POA: Diagnosis not present

## 2021-01-07 DIAGNOSIS — IMO0002 Reserved for concepts with insufficient information to code with codable children: Secondary | ICD-10-CM

## 2021-01-07 DIAGNOSIS — I10 Essential (primary) hypertension: Secondary | ICD-10-CM | POA: Diagnosis not present

## 2021-01-07 MED ORDER — ONETOUCH ULTRA VI STRP
ORAL_STRIP | 1 refills | Status: DC
Start: 2021-01-07 — End: 2021-06-26

## 2021-01-07 MED ORDER — METFORMIN HCL 850 MG PO TABS: ORAL_TABLET | ORAL | 1 refills | Status: DC

## 2021-01-07 MED ORDER — ONETOUCH DELICA PLUS LANCET33G MISC
1 refills | Status: DC
Start: 2021-01-07 — End: 2021-06-26

## 2021-01-07 MED ORDER — RYBELSUS 7 MG PO TABS
ORAL_TABLET | ORAL | 1 refills | Status: DC
Start: 2021-01-07 — End: 2021-06-26

## 2021-01-07 NOTE — Progress Notes (Signed)
Virtual Visit via Video Note  I connected with Cynthia Burns on 01/07/21 at 11:20 AM EST by a video enabled telemedicine application and verified that I am speaking with the correct person using two identifiers.  Location Patient: home with husband  Provider: home    I discussed the limitations of evaluation and management by telemedicine and the availability of in person appointments. The patient expressed understanding and agreed to proceed.  History of Present Illness: Pt is home with no complaints  bp 140/80 at cardiology office.  She has been running around today and was frustrated with video call    HPI HYPERTENSION   Blood pressure range-140/80   Chest pain- no      Dyspnea- no Lightheadedness- no   Edema- no  Other side effects - no   Medication compliance: good Low salt diet- yes    DIABETES    Blood Sugar ranges-good per pt   Polyuria- no New Visual problems- no  Hypoglycemic symptoms- no  Other side effects-no Medication compliance - good Last eye exam- utd     HYPERLIPIDEMIA  Medication compliance- good RUQ pain- no  Muscle aches- no Other side effects-no       Observations/Objective: Vitals:   01/07/21 1030  BP: (!) 161/80  Pulse: 77  bs 112 yesterday   Repeat bp 140/80 Pt in nad   Assessment and Plan: 1. Hyperlipidemia associated with type 2 diabetes mellitus ([place]) Tolerating statin, encouraged heart healthy diet, avoid trans fats, minimize simple carbs and saturated fats. Increase exercise as tolerated - Lipid panel; Future - Hemoglobin A1c; Future - Comprehensive metabolic panel; Future - Microalbumin / creatinine urine ratio; Future - CK (Creatine Kinase); Future  2. Primary hypertension Well controlled, no changes to meds. Encouraged heart healthy diet such as the DASH diet and exercise as tolerated.  - Lipid panel; Future - Hemoglobin A1c; Future - Comprehensive metabolic panel; Future - Microalbumin / creatinine urine ratio;  Future  3. Type 2 diabetes mellitus with hyperglycemia, without long-term current use of insulin (HCC) Check labs  hgba1c to be checked ,minimize simple carbs. Increase exercise as tolerated. Continue current meds  - Lipid panel; Future - Hemoglobin A1c; Future - Comprehensive metabolic panel; Future - Microalbumin / creatinine urine ratio; Future - CK (Creatine Kinase); Future  4. Vitamin D deficiency   - Vitamin D (25 hydroxy); Future   Follow Up Instructions:    I discussed the assessment and treatment plan with the patient. The patient was provided an opportunity to ask questions and all were answered. The patient agreed with the plan and demonstrated an understanding of the instructions.   The patient was advised to call back or seek an in-person evaluation if the symptoms worsen or if the condition fails to improve as anticipated.  I provided 25 minutes of non-face-to-face time during this encounter.   Ann Held, DO

## 2021-01-08 ENCOUNTER — Telehealth: Payer: Medicare Other | Admitting: Family Medicine

## 2021-01-17 ENCOUNTER — Other Ambulatory Visit: Payer: Self-pay

## 2021-01-17 ENCOUNTER — Other Ambulatory Visit (INDEPENDENT_AMBULATORY_CARE_PROVIDER_SITE_OTHER): Payer: Medicare Other

## 2021-01-17 DIAGNOSIS — E559 Vitamin D deficiency, unspecified: Secondary | ICD-10-CM | POA: Diagnosis not present

## 2021-01-17 DIAGNOSIS — E785 Hyperlipidemia, unspecified: Secondary | ICD-10-CM | POA: Diagnosis not present

## 2021-01-17 DIAGNOSIS — E1165 Type 2 diabetes mellitus with hyperglycemia: Secondary | ICD-10-CM | POA: Diagnosis not present

## 2021-01-17 DIAGNOSIS — I10 Essential (primary) hypertension: Secondary | ICD-10-CM

## 2021-01-17 DIAGNOSIS — E1169 Type 2 diabetes mellitus with other specified complication: Secondary | ICD-10-CM

## 2021-01-17 LAB — COMPREHENSIVE METABOLIC PANEL
ALT: 15 U/L (ref 0–35)
AST: 18 U/L (ref 0–37)
Albumin: 4.2 g/dL (ref 3.5–5.2)
Alkaline Phosphatase: 33 U/L — ABNORMAL LOW (ref 39–117)
BUN: 16 mg/dL (ref 6–23)
CO2: 29 mEq/L (ref 19–32)
Calcium: 9.2 mg/dL (ref 8.4–10.5)
Chloride: 104 mEq/L (ref 96–112)
Creatinine, Ser: 0.81 mg/dL (ref 0.40–1.20)
GFR: 72.19 mL/min (ref >60.00)
Glucose, Bld: 90 mg/dL (ref 70–99)
Potassium: 4 mEq/L (ref 3.5–5.1)
Sodium: 139 mEq/L (ref 135–145)
Total Bilirubin: 0.5 mg/dL (ref 0.2–1.2)
Total Protein: 6.5 g/dL (ref 6.0–8.3)

## 2021-01-17 LAB — LIPID PANEL
Cholesterol: 90 mg/dL (ref 0–200)
HDL: 35.9 mg/dL — ABNORMAL LOW (ref >39.00)
LDL Cholesterol: 33 mg/dL (ref 0–99)
NonHDL: 54.41
Total CHOL/HDL Ratio: 3
Triglycerides: 105 mg/dL (ref 0.0–149.0)
VLDL: 21 mg/dL (ref 0.0–40.0)

## 2021-01-17 LAB — MICROALBUMIN / CREATININE URINE RATIO
Creatinine,U: 169.7 mg/dL
Microalb Creat Ratio: 0.7 mg/g (ref 0.0–30.0)
Microalb, Ur: 1.1 mg/dL (ref 0.0–1.9)

## 2021-01-17 LAB — HEMOGLOBIN A1C: Hgb A1c MFr Bld: 6.4 % (ref 4.6–6.5)

## 2021-01-17 LAB — VITAMIN D 25 HYDROXY (VIT D DEFICIENCY, FRACTURES): VITD: 37.19 ng/mL (ref 30.00–100.00)

## 2021-01-17 LAB — CK: Total CK: 56 U/L (ref 7–177)

## 2021-02-19 DIAGNOSIS — L814 Other melanin hyperpigmentation: Secondary | ICD-10-CM | POA: Diagnosis not present

## 2021-02-19 DIAGNOSIS — L821 Other seborrheic keratosis: Secondary | ICD-10-CM | POA: Diagnosis not present

## 2021-02-19 DIAGNOSIS — D225 Melanocytic nevi of trunk: Secondary | ICD-10-CM | POA: Diagnosis not present

## 2021-02-19 DIAGNOSIS — I788 Other diseases of capillaries: Secondary | ICD-10-CM | POA: Diagnosis not present

## 2021-02-19 DIAGNOSIS — L728 Other follicular cysts of the skin and subcutaneous tissue: Secondary | ICD-10-CM | POA: Diagnosis not present

## 2021-02-19 DIAGNOSIS — D1801 Hemangioma of skin and subcutaneous tissue: Secondary | ICD-10-CM | POA: Diagnosis not present

## 2021-02-19 DIAGNOSIS — L84 Corns and callosities: Secondary | ICD-10-CM | POA: Diagnosis not present

## 2021-05-21 DIAGNOSIS — E119 Type 2 diabetes mellitus without complications: Secondary | ICD-10-CM | POA: Diagnosis not present

## 2021-05-21 DIAGNOSIS — H2513 Age-related nuclear cataract, bilateral: Secondary | ICD-10-CM | POA: Diagnosis not present

## 2021-05-21 DIAGNOSIS — H524 Presbyopia: Secondary | ICD-10-CM | POA: Diagnosis not present

## 2021-05-21 LAB — HM DIABETES EYE EXAM

## 2021-06-25 ENCOUNTER — Other Ambulatory Visit: Payer: Self-pay | Admitting: Family Medicine

## 2021-06-25 DIAGNOSIS — IMO0002 Reserved for concepts with insufficient information to code with codable children: Secondary | ICD-10-CM

## 2021-07-08 ENCOUNTER — Ambulatory Visit: Payer: Medicare Other | Admitting: Family Medicine

## 2021-07-08 NOTE — Progress Notes (Incomplete)
[name], female    DOB: 1946/01/11  Age: 75 y.o. MRN: [id number]    Subjective:  Subjective  HPI Cynthia Burns presents for an office visit and 6 month f/u for HLD today.   She denies any chest pain, SOB, fever, abdominal pain, cough, chills, sore throat, dysuria, urinary incontinence, back pain, HA, or N/V/D at this time.    Review of Systems  Constitutional:  Negative for chills, fatigue and fever.  HENT:  Negative for ear pain, rhinorrhea, sinus pressure, sinus pain, sore throat and tinnitus.   Eyes:  Negative for pain.  Respiratory:  Negative for cough, shortness of breath and wheezing.   Cardiovascular:  Negative for chest pain.  Gastrointestinal:  Negative for abdominal pain, anal bleeding, constipation, diarrhea, nausea and vomiting.  Genitourinary:  Negative for flank pain.  Musculoskeletal:  Negative for back pain and neck pain.  Skin:  Negative for rash.  Neurological:  Negative for seizures, weakness, light-headedness, numbness and headaches.   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   Her family history includes Diabetes in her father; Emphysema in her father; Heart disease in her mother; Hypertension in an other family member.She reports that she has never smoked. She has never used smokeless tobacco. She reports that she does not drink alcohol and does not use drugs.  Current Outpatient Medications on File Prior to Visit  Medication Sig Dispense Refill   amoxicillin (AMOXIL) 500 MG capsule Take 500 mg by mouth. For dental procedures     aspirin EC 81 MG tablet Take 1 tablet (81 mg total) by mouth daily. 90 tablet 3   Biotin 1000 MCG tablet Take 5,000 mcg  by mouth daily.     Blood Glucose Monitoring Suppl (ONE TOUCH ULTRA 2) w/Device KIT CHECK BLOOD SUGAR TWICE DAILY.  DX CODE  T88.8 1 kit 0   BYSTOLIC 5 MG tablet TAKE ONE TABLET BY MOUTH EVERY DAY 90 tablet 3   calcium carbonate (OSCAL) 1500 (600 Ca) MG TABS tablet Take 600 mg of elemental calcium by mouth daily with breakfast. Pt takes 1 tablet 1 to 2 times a week due to side effects of bumps in mouth due to intolerance of coating on the tablets.     CRESTOR 20 MG tablet TAKE ONE TABLET BY MOUTH EVERY DAY 90 tablet 3   Cyanocobalamin (VITAMIN B-12 PO) Take 1 tablet by mouth daily.      fexofenadine (ALLEGRA) 180 MG tablet Take 180 mg by mouth daily.     furosemide (LASIX) 20 MG tablet TAKE ONE-HALF TABLET BY MOUTH EVERY DAY 45 tablet 3   Lancets (ONETOUCH DELICA PLUS [id number]) MISC USE 1 LANCET TO TEST AS DIRECTED (DISCARD LANCET IN SHARPS CONTAINER IMMEDIATELY AFTER USE) 200 each 1   losartan (COZAAR) 25 MG tablet TAKE ONE TABLET BY MOUTH EVERY DAY 90 tablet 3   magnesium oxide (MAG-OX) 400 MG tablet Take 400 mg by mouth 3 (three) times a week.     metFORMIN (GLUCOPHAGE) 850 MG tablet TAKE ONE TABLET BY MOUTH THREE TIMES A DAY WITH FOOD 270 tablet 1   Multiple Vitamins-Minerals (PRESERVISION AREDS 2) CAPS Take by  mouth.     ONETOUCH ULTRA test strip USE ONE STRIP TO TEST TWICE A DAY - (KEEP UNUSED STRIPS IN ORIGINAL SEALED CONTAINER BETWEEN USES) 200 each 1   RYBELSUS 7 MG TABS TAKE ONE TABLET BY MOUTH EVERY DAY - (TAKE WITH NO MORE THAN 4 OUNCES OF PLAIN WATER AT LEAST 30 MINUTES BEFORE THE FIRST FOOD, BEVERAGE, OR OTHER MEDICATIONS OF THE DAY) 90 tablet 1   VITAMIN D, CHOLECALCIFEROL, PO Take 25 mcg by mouth daily.     No current facility-administered medications on file prior to visit.     Objective:  Objective  Physical Exam Vitals and nursing note reviewed.  Constitutional:      General: She is not in acute distress.    Appearance: Normal appearance. She is well-developed. She is  not ill-appearing.  HENT:     Head: Normocephalic and atraumatic.     Right Ear: External ear normal.     Left Ear: External ear normal.     Nose: Nose normal.  Eyes:     General:        Right eye: No discharge.        Left eye: No discharge.     Extraocular Movements: Extraocular movements intact.     Pupils: Pupils are equal, round, and reactive to light.  Cardiovascular:     Rate and Rhythm: Normal rate and regular rhythm.     Pulses: Normal pulses.     Heart sounds: Normal heart sounds. No murmur heard.   No friction rub. No gallop.  Pulmonary:     Effort: Pulmonary effort is normal. No respiratory distress.     Breath sounds: Normal breath sounds. No stridor. No wheezing, rhonchi or rales.  Chest:     Chest wall: No tenderness.  Abdominal:     General: Bowel sounds are normal. There is no distension.     Palpations: Abdomen is soft. There is no mass.     Tenderness: There is no abdominal tenderness. There is no guarding or rebound.     Hernia: No hernia is present.  Musculoskeletal:        General: Normal range of motion.     Cervical back: Normal range of motion and neck supple.     Right lower leg: No edema.     Left lower leg: No edema.  Skin:    General: Skin is warm and dry.  Neurological:     Mental Status: She is alert and oriented to person, place, and time.  Psychiatric:        Behavior: Behavior normal.        Thought Content: Thought content normal.   There were no vitals taken for this visit. Wt Readings from Last 3 Encounters:  01/07/21 127 lb 6.4 oz (57.8 kg)  12/03/20 129 lb 12.8 oz (58.9 kg)  07/06/20 138 lb 3.2 oz (62.7 kg)     Lab Results  Component Value Date   WBC 6.0 09/25/2016   HGB 13.0 09/25/2016   HCT 38.8 09/25/2016   PLT 257.0 09/25/2016   GLUCOSE 90 01/17/2021   CHOL 90 01/17/2021   TRIG 105.0 01/17/2021   HDL 35.90 (L) 01/17/2021   LDLDIRECT 53.0 05/25/2018   LDLCALC 33 01/17/2021   ALT 15 01/17/2021   AST 18 01/17/2021    NA 139 01/17/2021   K 4.0 01/17/2021   CL 104 01/17/2021   CREATININE 0.81 01/17/2021   BUN 16 01/17/2021   CO2 29 01/17/2021  HGBA1C 6.4 01/17/2021   MICROALBUR 1.1 01/17/2021    DG Bone Density  Result Date: 10/18/2020 EXAM: DUAL X-RAY ABSORPTIOMETRY (DXA) FOR BONE MINERAL DENSITY IMPRESSION: Referring Physician:  Rosalita Chessman CHASE Your patient completed a BMD test using Lunar IDXA DXA system ( analysis version: 16 ) manufactured by [employer]. Technologist: AW PATIENT: Name: Salena, Ortlieb Patient ID: [id number] Birth Date: [birth year] Height: 61.8 in. Sex: Female Measured: 10/18/2020 Weight: 138.2 lbs. Indications: Advanced Age, Caucasian, Estrogen Deficient, Family Hist. (Parent hip fracture), Hysterectomy, Postmenopausal Fractures: Left wrist Treatments: Vitamin D (E933.5) ASSESSMENT: The BMD measured at Femur Neck Left is 0.945 g/cm2 with a T-score of -0.7. This patient is considered NORMAL according to [facility]) criteria. The scan quality is good. Prior DXA exam performed on Hologic device measures only unilateral hip (not Total Mean Hip). Therefore, current Total Mean Hip  cannot be compared to prior exam. Site Region Measured Date Measured Age YA T-score BMD Significant CHANGE AP Spine L1-L4 10/18/2020 72.8 -0.2 1.153 g/cm2 * AP Spine L1-L4 10/04/2014 66.7 -0.6 1.102 g/cm2 * DualFemur Neck Left  10/18/2020    72.8         -0.7    0.945 g/cm2 DualFemur Neck Left  10/04/2014    66.7         -0.5    0.972 g/cm2 DualFemur Total Mean 10/18/2020    72.8         0.3     1.043 g/cm2 World Health Organization [facility]) criteria for post-menopausal, Caucasian Women: Normal       T-score at or above -1 SD Osteopenia   T-score between -1 and -2.5 SD Osteoporosis T-score at or below -2.5 SD RECOMMENDATION: 1. All patients should optimize calcium and vitamin D intake. 2. Consider FDA approved medical therapies in postmenopausal women and men aged 29 years and older, based on the  following: a. A hip or vertebral (clinical or morphometric) fracture b. T-score < or = -2.5 at the femoral neck or spine after appropriate evaluation to exclude secondary causes c. Low bone mass (T-score between -1.0 and -2.5 at the femoral neck or spine) and a 10 year probability of a hip fracture > or = 3% or a 10 year probability of a major osteoporosis-related fracture > or = 20% based on the [place]-adapted WHO algorithm d. Clinician judgment and/or patient preferences may indicate treatment for people with 10-year fracture probabilities above or below these levels FOLLOW-UP: Patients with diagnosis of osteoporosis or at high risk for fracture should have regular bone mineral density tests. For patients eligible for Medicare, routine testing is allowed once every 2 years. The testing frequency can be increased to one year for patients who have rapidly progressing disease, those who are receiving or discontinuing medical therapy to restore bone mass, or have additional risk factors. I have reviewed this report and agree with the above findings. Mark A. Thornton Papas, M.D. [facility] Radiology Electronically Signed   By: Lavonia Dana M.D.   On: 10/18/2020 10:21   MM 3D SCREEN BREAST BILATERAL  Result Date: 10/19/2020 CLINICAL DATA:  Screening. EXAM: DIGITAL SCREENING BILATERAL MAMMOGRAM WITH TOMO AND CAD COMPARISON:  Previous exam(s). ACR Breast Density Category b: There are scattered areas of fibroglandular density. FINDINGS: There are no findings suspicious for malignancy. Images were processed with CAD. IMPRESSION: No mammographic evidence of malignancy. A result letter of this screening mammogram will be mailed directly to the patient. RECOMMENDATION: Screening mammogram in one year. (Code:[id number]-B-01Y) BI-RADS CATEGORY  1: Negative. Electronically Signed   By: Evangeline Dakin M.D.   On: 10/19/2020 15:39     Assessment & Plan:  Plan    No orders of the defined types were placed in this encounter.   Problem List  Items Addressed This Visit   None   Follow-up: No follow-ups on file.   I,Gordon Zheng,acting as a Education administrator for [employer], DO.,have documented all relevant documentation on the behalf of Ann Held, DO,as directed by  Ann Held, DO while in the presence of Ann Held, DO.  ***

## 2021-07-10 ENCOUNTER — Ambulatory Visit: Payer: Medicare Other | Admitting: *Deleted

## 2021-07-11 ENCOUNTER — Ambulatory Visit: Payer: Medicare Other

## 2021-07-23 ENCOUNTER — Ambulatory Visit (INDEPENDENT_AMBULATORY_CARE_PROVIDER_SITE_OTHER): Payer: Medicare Other

## 2021-07-23 VITALS — Ht 62.0 in | Wt 124.4 lb

## 2021-07-23 DIAGNOSIS — Z0000 Encounter for general adult medical examination without abnormal findings: Secondary | ICD-10-CM

## 2021-07-23 NOTE — Progress Notes (Signed)
Subjective:   Cynthia Burns is a 75 y.o. female who presents for Medicare Annual (Subsequent) preventive examination.  I connected with Anwen today by telephone and verified that I am speaking with the correct person using two identifiers. Location patient: home Location provider: work Persons participating in the virtual visit: patient, Marine scientist.    I discussed the limitations, risks, security and privacy concerns of performing an evaluation and management service by telephone and the availability of in person appointments. I also discussed with the patient that there may be a patient responsible charge related to this service. The patient expressed understanding and verbally consented to this telephonic visit.    Interactive audio and video telecommunications were attempted between this provider and patient, however failed, due to patient having technical difficulties OR patient did not have access to video capability.  We continued and completed visit with audio only.  Some vital signs may be absent or patient reported.   Time Spent with patient on telephone encounter: 25 minutes   Review of Systems     Cardiac Risk Factors include: advanced age (>31mn, >>76women);diabetes mellitus;dyslipidemia;hypertension     Objective:    Today's Vitals   07/23/21 1503  Weight: 124 lb 6.4 oz (56.4 kg)  Height: '5\' 2"'  (1.575 m)   Body mass index is 22.75 kg/m.  Advanced Directives 07/23/2021 07/09/2020 06/23/2019 05/25/2018 09/25/2016  Does Patient Have a Medical Advance Directive? Yes Yes Yes Yes Yes  Type of AParamedicof ABennettLiving will HT[place]Living will HLincolnLiving will HWarrior RunLiving will HGoletaLiving will  Does patient want to make changes to medical advance directive? - No - Patient declined No - Patient declined - No - Patient declined  Copy of [place] in Chart? No - copy requested No - copy requested No - copy requested No - copy requested No - copy requested    Current Medications (verified) Outpatient Encounter Medications as of 07/23/2021  Medication Sig   amoxicillin (AMOXIL) 500 MG capsule Take 500 mg by mouth. For dental procedures   aspirin EC 81 MG tablet Take 1 tablet (81 mg total) by mouth daily.   Biotin 1000 MCG tablet Take 5,000 mcg by mouth daily.   Blood Glucose Monitoring Suppl (ONE TOUCH ULTRA 2) w/Device KIT CHECK BLOOD SUGAR TWICE DAILY.  DX CODE  ES97.0  BYSTOLIC 5 MG tablet TAKE ONE TABLET BY MOUTH EVERY DAY   calcium carbonate (OSCAL) 1500 (600 Ca) MG TABS tablet Take 600 mg of elemental calcium by mouth daily with breakfast. Pt takes 1 tablet 1 to 2 times a week due to side effects of bumps in mouth due to intolerance of coating on the tablets.   CRESTOR 20 MG tablet TAKE ONE TABLET BY MOUTH EVERY DAY   Cyanocobalamin (VITAMIN B-12 PO) Take 1 tablet by mouth daily.    fexofenadine (ALLEGRA) 180 MG tablet Take 180 mg by mouth daily.   furosemide (LASIX) 20 MG tablet TAKE ONE-HALF TABLET BY MOUTH EVERY DAY   Lancets (ONETOUCH DELICA PLUS [id number] MISC USE 1 LANCET TO TEST AS DIRECTED (DISCARD LANCET IN SHARPS CONTAINER IMMEDIATELY AFTER USE)   losartan (COZAAR) 25 MG tablet TAKE ONE TABLET BY MOUTH EVERY DAY   magnesium oxide (MAG-OX) 400 MG tablet Take 400 mg by mouth 3 (three) times a week.   metFORMIN (GLUCOPHAGE) 850 MG tablet TAKE ONE TABLET BY MOUTH THREE TIMES A DAY  WITH FOOD   Multiple Vitamins-Minerals (PRESERVISION AREDS 2) CAPS Take by mouth.   ONETOUCH ULTRA test strip USE ONE STRIP TO TEST TWICE A DAY - (KEEP UNUSED STRIPS IN ORIGINAL SEALED CONTAINER BETWEEN USES)   RYBELSUS 7 MG TABS TAKE ONE TABLET BY MOUTH EVERY DAY - (TAKE WITH NO MORE THAN 4 OUNCES OF PLAIN WATER AT LEAST 30 MINUTES BEFORE THE FIRST FOOD, BEVERAGE, OR OTHER MEDICATIONS OF THE DAY)   VITAMIN D, CHOLECALCIFEROL, PO Take 25 mcg by mouth  daily.   No facility-administered encounter medications on file as of 07/23/2021.    Allergies (verified) Mucinex [guaifenesin er], Codeine, Advicor [niacin-lovastatin er], Amlodipine, Lisinopril-hydrochlorothiazide, Other, Ramipril, and Sulfonamide derivatives   History: Past Medical History:  Diagnosis Date   Diabetes mellitus without complication ([place])    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   HYPERTENSION    MITRAL VALVE PROLAPSE    OSTEOPENIA    Overweight(278.02)    PHARYNGITIS, ACUTE    Past Surgical History:  Procedure Laterality Date   ABDOMINAL HYSTERECTOMY     CHOLECYSTECTOMY     Family History  Problem Relation Age of Onset   Emphysema Father    Diabetes Father    Heart disease Mother        Had a stent   Hypertension Other    Colon cancer Neg Hx    BRCA 1/2 Neg Hx    Breast cancer Neg Hx    Social History   Socioeconomic History   Marital status: Married    Spouse name: Not on file   Number of children: Not on file   Years of education: Not on file   Highest education level: Not on file  Occupational History   Occupation: hairdresser  Tobacco Use   Smoking status: Never   Smokeless tobacco: Never  Vaping Use   Vaping Use: Never used  Substance and Sexual Activity   Alcohol use: No    Alcohol/week: 0.0 standard drinks   Drug use: No   Sexual activity: Not Currently  Other Topics Concern   Not on file  Social History Narrative   Not on file   Social Determinants of Health   Financial Resource Strain: Low Risk    Difficulty of Paying Living Expenses: Not hard at all  Food Insecurity: No Food Insecurity   Worried About Charity fundraiser in the Last Year: Never true   Ran Out of Food in the Last Year: Never true  Transportation Needs: No Transportation Needs   Lack of Transportation (Medical): No   Lack of Transportation (Non-Medical): No  Physical Activity: Inactive   Days of Exercise per Week: 0 days   Minutes of Exercise per  Session: 0 min  Stress: No Stress Concern Present   Feeling of Stress : Not at all  Social Connections: Socially Integrated   Frequency of Communication with Friends and Family: More than three times a week   Frequency of Social Gatherings with Friends and Family: More than three times a week   Attends Religious Services: More than 4 times per year   Active Member of [employer] or Organizations: Yes   Attends Music therapist: More than 4 times per year   Marital Status: Married    Tobacco Counseling Counseling given: Not Answered   Clinical Intake:  Pre-visit preparation completed: Yes  Pain : No/denies pain     Nutritional Status: BMI of 19-24  Normal Nutritional Risks: None Diabetes: Yes CBG  done?: No Did pt. bring in CBG monitor from home?: No  How often do you need to have someone help you when you read instructions, pamphlets, or other written materials from your doctor or pharmacy?: 1 - Never   Diabetes:  Is the patient diabetic?  Yes  If diabetic, was a CBG obtained today?  No  Did the patient bring in their glucometer from home?  No phone visit How often do you monitor your CBG's? occasionally.   Financial Strains and Diabetes Management:  Are you having any financial strains with the device, your supplies or your medication? No .  Does the patient want to be seen by Chronic Care Management for management of their diabetes?  No  Would the patient like to be referred to a Nutritionist or for Diabetic Management?  No   Diabetic Exams:  Diabetic Eye Exam: Completed 05/21/2021.   Diabetic Foot Exam: Pt has been advised about the importance in completing this exam. To be completed by PCP.    Interpreter Needed?: No  Information entered by :: Caroleen Hamman LPN   Activities of Daily Living In your present state of health, do you have any difficulty performing the following activities: 07/23/2021  Hearing? N  Vision? N  Difficulty concentrating or  making decisions? N  Walking or climbing stairs? N  Dressing or bathing? N  Doing errands, shopping? N  Preparing Food and eating ? N  Using the Toilet? N  In the past six months, have you accidently leaked urine? N  Do you have problems with loss of bowel control? N  Managing your Medications? N  Managing your Finances? N  Housekeeping or managing your Housekeeping? N  Some recent data might be hidden    Patient Care Team: Carollee Herter, Alferd Apa, DO as PCP - General Josue Hector, MD as PCP - Cardiology (Cardiology) Shon Hough, MD as Consulting Physician (Ophthalmology) Lafayette Dragon, MD (Inactive) (Gastroenterology) Mcarthur Rossetti, MD as Consulting Physician (Orthopedic Surgery) Druscilla Brownie, MD as Referring Physician (Dermatology)  Indicate any recent Medical Services you may have received from other than [facility] providers in the past year (date may be approximate).     Assessment:   This is a routine wellness examination for Cynthia Burns.  Hearing/Vision screen Hearing Screening - Comments:: No issues Vision Screening - Comments:: Last eye exam-04/2021-Dr. Bowen  Dietary issues and exercise activities discussed: Current Exercise Habits: The patient does not participate in regular exercise at present, Exercise limited by: None identified   Goals Addressed   None    Depression Screen PHQ 2/9 Scores 07/23/2021 07/06/2020 06/23/2019 05/25/2018 11/19/2017 09/25/2016 09/13/2015  PHQ - 2 Score 0 0 0 0 0 0 0    Fall Risk Fall Risk  07/23/2021 07/09/2020 07/06/2020 06/23/2019 05/25/2018  Falls in the past year? 0 0 0 0 No  Number falls in past yr: 0 0 0 - -  Injury with Fall? 0 0 0 - -  Risk for fall due to : - - - - -  Risk for fall due to: Comment - - - - -  Follow up Falls prevention discussed Education provided;Falls prevention discussed Falls evaluation completed - -    FALL RISK PREVENTION PERTAINING TO THE HOME:  Any stairs in or around the home? Yes  If so, are  there any without handrails? No  Home free of loose throw rugs in walkways, pet beds, electrical cords, etc? Yes  Adequate lighting in your home to reduce risk  of falls? Yes   ASSISTIVE DEVICES UTILIZED TO PREVENT FALLS:  Life alert? No  Use of a cane, walker or w/c? No  Grab bars in the bathroom? Yes  Shower chair or bench in shower? No  Elevated toilet seat or a handicapped toilet? No   TIMED UP AND GO:  Was the test performed? No . Phone visit   Cognitive Function:Normal cognitive status assessed by  this Nurse Health Advisor. No abnormalities found.   MMSE - Mini Mental State Exam 09/25/2016  Orientation to time 5  Orientation to Place 5  Registration 3  Attention/ Calculation 5  Recall 3  Language- name 2 objects 2  Language- repeat 1  Language- follow 3 step command 3  Language- read & follow direction 1  Write a sentence 1  Copy design 1  Total score 30        Immunizations Immunization History  Administered Date(s) Administered   Fluad Quad(high Dose 65+) 08/18/2019   Influenza Whole 10/20/2007   Influenza, High Dose Seasonal PF 09/01/2016, 09/18/2018, 08/30/2020   Influenza,inj,Quad PF,6+ Mos 09/05/2013, 09/05/2014, 09/13/2015   Influenza-Unspecified 09/21/2017   PFIZER(Purple Top)SARS-COV-2 Vaccination 02/13/2020, 03/05/2020, 11/06/2020   Pneumococcal Conjugate-13 02/27/2014   Pneumococcal Polysaccharide-23 09/06/2011, 07/26/2019, 07/25/2020   Td 07/26/2019   Zoster Recombinat (Shingrix) 04/12/2017, 06/13/2017   Zoster, Live 09/14/2013    TDAP status: Up to date  Flu Vaccine status: Up to date  Pneumococcal vaccine status: Up to date  Covid-19 vaccine status: Completed vaccines  Qualifies for Shingles Vaccine? No   Zostavax completed Yes   Shingrix Completed?: Yes  Screening Tests Health Maintenance  Topic Date Due   FOOT EXAM  07/06/2021   HEMOGLOBIN A1C  07/17/2021   INFLUENZA VACCINE  07/22/2021   COVID-19 Vaccine (4 - Booster for  [place] series) 08/08/2021 (Originally 03/06/2021)   MAMMOGRAM  10/18/2021   OPHTHALMOLOGY EXAM  05/21/2022   COLONOSCOPY (Pts 45-65yr Insurance coverage will need to be confirmed)  11/09/2023   TETANUS/TDAP  07/25/2029   DEXA SCAN  Completed   Hepatitis C Screening  Completed   PNA vac Low Risk Adult  Completed   Zoster Vaccines- Shingrix  Completed   HPV VACCINES  Aged Out    Health Maintenance  Health Maintenance Due  Topic Date Due   FOOT EXAM  07/06/2021   HEMOGLOBIN A1C  07/17/2021   INFLUENZA VACCINE  07/22/2021    Colorectal cancer screening: Type of screening: Colonoscopy. Completed 11/08/2013. Repeat every 10 years  Mammogram status: Completed 10/18/2020-Bilateral. Repeat every year  Bone Density status: Completed 10/18/2020. Results reflect: Bone density results: NORMAL. Repeat every 2 years.  Lung Cancer Screening: (Low Dose CT Chest recommended if Age 185-80years, 30 pack-year currently smoking OR have quit w/in 15years.) does not qualify.    Additional Screening:  Hepatitis C Screening:  Completed 09/13/2015  Vision Screening: Recommended annual ophthalmology exams for early detection of glaucoma and other disorders of the eye. Is the patient up to date with their annual eye exam?  Yes  Who is the provider or what is the name of the office in which the patient attends annual eye exams? Dr. BValetta Close  Dental Screening: Recommended annual dental exams for proper oral hygiene  Community Resource Referral / Chronic Care Management: CRR required this visit?  No   CCM required this visit?  No      Plan:     I have personally reviewed and noted the following in the patient's chart:  Medical and social history Use of alcohol, tobacco or illicit drugs  Current medications and supplements including opioid prescriptions.  Functional ability and status Nutritional status Physical activity Advanced directives List of other physicians Hospitalizations,  surgeries, and ER visits in previous 12 months Vitals Screenings to include cognitive, depression, and falls Referrals and appointments  In addition, I have reviewed and discussed with patient certain preventive protocols, quality metrics, and best practice recommendations. A written personalized care plan for preventive services as well as general preventive health recommendations were provided to patient.   Due to this being a telephonic visit, the after visit summary with patients personalized plan was offered to patient via mail or my-chart.  Patient would like to access on my-chart.   Marta Antu, LPN   04/28/3219  Nurse Health Advisor  Nurse Notes: None

## 2021-07-23 NOTE — Patient Instructions (Signed)
Cynthia Burns , Thank you for taking time to complete your Medicare Wellness Visit. I appreciate your ongoing commitment to your health goals. Please review the following plan we discussed and let me know if I can assist you in the future.   Screening recommendations/referrals: Colonoscopy: Completed 11/08/2013-Due 11/09/2023 Mammogram: Completed 10/18/2020-Due 10/18/2021 Bone Density: Completed 10/18/2020-Due 10/18/2022 Recommended yearly ophthalmology/optometry visit for glaucoma screening and checkup Recommended yearly dental visit for hygiene and checkup  Vaccinations: Influenza vaccine: Up to date Pneumococcal vaccine: Up to date Tdap vaccine: Up to date-Due-07/25/2029 Shingles vaccine: Completed vaccines   Covid-19:Completed three doses  Advanced directives: Please bring a copy for your chart  Conditions/risks identified: See problem list  Next appointment: Follow up in one year for your annual wellness visit 07/28/2022 @ 11:00   Preventive Care 65 Years and Older, Female Preventive care refers to lifestyle choices and visits with your health care provider that can promote health and wellness. What does preventive care include? A yearly physical exam. This is also called an annual well check. Dental exams once or twice a year. Routine eye exams. Ask your health care provider how often you should have your eyes checked. Personal lifestyle choices, including: Daily care of your teeth and gums. Regular physical activity. Eating a healthy diet. Avoiding tobacco and drug use. Limiting alcohol use. Practicing safe sex. Taking low-dose aspirin every day. Taking vitamin and mineral supplements as recommended by your health care provider. What happens during an annual well check? The services and screenings done by your health care provider during your annual well check will depend on your age, overall health, lifestyle risk factors, and family history of disease. Counseling  Your  health care provider may ask you questions about your: Alcohol use. Tobacco use. Drug use. Emotional well-being. Home and relationship well-being. Sexual activity. Eating habits. History of falls. Memory and ability to understand (cognition). Work and work Statistician. Reproductive health. Screening  You may have the following tests or measurements: Height, weight, and BMI. Blood pressure. Lipid and cholesterol levels. These may be checked every 5 years, or more frequently if you are over 55 years old. Skin check. Lung cancer screening. You may have this screening every year starting at age 35 if you have a 30-pack-year history of smoking and currently smoke or have quit within the past 15 years. Fecal occult blood test (FOBT) of the stool. You may have this test every year starting at age 29. Flexible sigmoidoscopy or colonoscopy. You may have a sigmoidoscopy every 5 years or a colonoscopy every 10 years starting at age 71. Hepatitis C blood test. Hepatitis B blood test. Sexually transmitted disease (STD) testing. Diabetes screening. This is done by checking your blood sugar (glucose) after you have not eaten for a while (fasting). You may have this done every 1-3 years. Bone density scan. This is done to screen for osteoporosis. You may have this done starting at age 62. Mammogram. This may be done every 1-2 years. Talk to your health care provider about how often you should have regular mammograms. Talk with your health care provider about your test results, treatment options, and if necessary, the need for more tests. Vaccines  Your health care provider may recommend certain vaccines, such as: Influenza vaccine. This is recommended every year. Tetanus, diphtheria, and acellular pertussis (Tdap, Td) vaccine. You may need a Td booster every 10 years. Zoster vaccine. You may need this after age 77. Pneumococcal 13-valent conjugate (PCV13) vaccine. One dose is recommended after age  74. Pneumococcal polysaccharide (PPSV23) vaccine. One dose is recommended after age 74. Talk to your health care provider about which screenings and vaccines you need and how often you need them. This information is not intended to replace advice given to you by your health care provider. Make sure you discuss any questions you have with your health care provider. Document Released: 01/04/2016 Document Revised: 08/27/2016 Document Reviewed: 10/09/2015 Elsevier Interactive Patient Education  2017 [place] Prevention in the Home Falls can cause injuries. They can happen to people of all ages. There are many things you can do to make your home safe and to help prevent falls. What can I do on the outside of my home? Regularly fix the edges of walkways and driveways and fix any cracks. Remove anything that might make you trip as you walk through a door, such as a raised step or threshold. Trim any bushes or trees on the path to your home. Use bright outdoor lighting. Clear any walking paths of anything that might make someone trip, such as rocks or tools. Regularly check to see if handrails are loose or broken. Make sure that both sides of any steps have handrails. Any raised decks and porches should have guardrails on the edges. Have any leaves, snow, or ice cleared regularly. Use sand or salt on walking paths during winter. Clean up any spills in your garage right away. This includes oil or grease spills. What can I do in the bathroom? Use night lights. Install grab bars by the toilet and in the tub and shower. Do not use towel bars as grab bars. Use non-skid mats or decals in the tub or shower. If you need to sit down in the shower, use a plastic, non-slip stool. Keep the floor dry. Clean up any water that spills on the floor as soon as it happens. Remove soap buildup in the tub or shower regularly. Attach bath mats securely with double-sided non-slip rug tape. Do not have throw  rugs and other things on the floor that can make you trip. What can I do in the bedroom? Use night lights. Make sure that you have a light by your bed that is easy to reach. Do not use any sheets or blankets that are too big for your bed. They should not hang down onto the floor. Have a firm chair that has side arms. You can use this for support while you get dressed. Do not have throw rugs and other things on the floor that can make you trip. What can I do in the kitchen? Clean up any spills right away. Avoid walking on wet floors. Keep items that you use a lot in easy-to-reach places. If you need to reach something above you, use a strong step stool that has a grab bar. Keep electrical cords out of the way. Do not use floor polish or wax that makes floors slippery. If you must use wax, use non-skid floor wax. Do not have throw rugs and other things on the floor that can make you trip. What can I do with my stairs? Do not leave any items on the stairs. Make sure that there are handrails on both sides of the stairs and use them. Fix handrails that are broken or loose. Make sure that handrails are as long as the stairways. Check any carpeting to make sure that it is firmly attached to the stairs. Fix any carpet that is loose or worn. Avoid having throw rugs at the  top or bottom of the stairs. If you do have throw rugs, attach them to the floor with carpet tape. Make sure that you have a light switch at the top of the stairs and the bottom of the stairs. If you do not have them, ask someone to add them for you. What else can I do to help prevent falls? Wear shoes that: Do not have high heels. Have rubber bottoms. Are comfortable and fit you well. Are closed at the toe. Do not wear sandals. If you use a stepladder: Make sure that it is fully opened. Do not climb a closed stepladder. Make sure that both sides of the stepladder are locked into place. Ask someone to hold it for you, if  possible. Clearly mark and make sure that you can see: Any grab bars or handrails. First and last steps. Where the edge of each step is. Use tools that help you move around (mobility aids) if they are needed. These include: Canes. Walkers. Scooters. Crutches. Turn on the lights when you go into a dark area. Replace any light bulbs as soon as they burn out. Set up your furniture so you have a clear path. Avoid moving your furniture around. If any of your floors are uneven, fix them. If there are any pets around you, be aware of where they are. Review your medicines with your doctor. Some medicines can make you feel dizzy. This can increase your chance of falling. Ask your doctor what other things that you can do to help prevent falls. This information is not intended to replace advice given to you by your health care provider. Make sure you discuss any questions you have with your health care provider. Document Released: 10/04/2009 Document Revised: 05/15/2016 Document Reviewed: 01/12/2015 Elsevier Interactive Patient Education  2017 [employer].

## 2021-07-26 ENCOUNTER — Other Ambulatory Visit: Payer: Self-pay

## 2021-07-26 ENCOUNTER — Ambulatory Visit: Payer: Medicare Other

## 2021-07-26 ENCOUNTER — Encounter: Payer: Self-pay | Admitting: Family Medicine

## 2021-07-26 ENCOUNTER — Ambulatory Visit (INDEPENDENT_AMBULATORY_CARE_PROVIDER_SITE_OTHER): Payer: Medicare Other | Admitting: Family Medicine

## 2021-07-26 VITALS — BP 124/72 | HR 66 | Ht 62.0 in | Wt 126.0 lb

## 2021-07-26 DIAGNOSIS — E119 Type 2 diabetes mellitus without complications: Secondary | ICD-10-CM

## 2021-07-26 DIAGNOSIS — I10 Essential (primary) hypertension: Secondary | ICD-10-CM | POA: Diagnosis not present

## 2021-07-26 DIAGNOSIS — E1169 Type 2 diabetes mellitus with other specified complication: Secondary | ICD-10-CM

## 2021-07-26 DIAGNOSIS — E785 Hyperlipidemia, unspecified: Secondary | ICD-10-CM | POA: Diagnosis not present

## 2021-07-26 DIAGNOSIS — E1165 Type 2 diabetes mellitus with hyperglycemia: Secondary | ICD-10-CM | POA: Diagnosis not present

## 2021-07-26 LAB — COMPREHENSIVE METABOLIC PANEL
ALT: 18 U/L (ref 0–35)
AST: 20 U/L (ref 0–37)
Albumin: 4.3 g/dL (ref 3.5–5.2)
Alkaline Phosphatase: 37 U/L — ABNORMAL LOW (ref 39–117)
BUN: 19 mg/dL (ref 6–23)
CO2: 31 mEq/L (ref 19–32)
Calcium: 9.4 mg/dL (ref 8.4–10.5)
Chloride: 102 mEq/L (ref 96–112)
Creatinine, Ser: 0.79 mg/dL (ref 0.40–1.20)
GFR: 74.11 mL/min (ref >60.00)
Glucose, Bld: 97 mg/dL (ref 70–99)
Potassium: 4.1 mEq/L (ref 3.5–5.1)
Sodium: 138 mEq/L (ref 135–145)
Total Bilirubin: 0.4 mg/dL (ref 0.2–1.2)
Total Protein: 7 g/dL (ref 6.0–8.3)

## 2021-07-26 LAB — LIPID PANEL
Cholesterol: 100 mg/dL (ref 0–200)
HDL: 40.8 mg/dL (ref >39.00)
LDL Cholesterol: 42 mg/dL (ref 0–99)
NonHDL: 59.23
Total CHOL/HDL Ratio: 2
Triglycerides: 86 mg/dL (ref 0.0–149.0)
VLDL: 17.2 mg/dL (ref 0.0–40.0)

## 2021-07-26 LAB — HEMOGLOBIN A1C: Hgb A1c MFr Bld: 6.3 % (ref 4.6–6.5)

## 2021-07-26 MED ORDER — METFORMIN HCL 850 MG PO TABS: ORAL_TABLET | ORAL | 1 refills | Status: DC

## 2021-07-26 NOTE — Patient Instructions (Signed)
https://www.diabeteseducator.org/docs/default-source/living-with-diabetes/conquering-the-grocery-store-v1.pdf?sfvrsn=4">  Carbohydrate Counting for Diabetes Mellitus, Adult Carbohydrate counting is a method of keeping track of how many carbohydrates you eat. Eating carbohydrates naturally increases the amount of sugar (glucose) in the blood. Counting how many carbohydrates you eat improves your bloodglucose control, which helps you manage your diabetes. It is important to know how many carbohydrates you can safely have in each meal. This is different for every person. A dietitian can help you make a meal plan and calculate how many carbohydrates you should have at each meal andsnack. What foods contain carbohydrates? Carbohydrates are found in the following foods: Grains, such as breads and cereals. Dried beans and soy products. Starchy vegetables, such as potatoes, peas, and corn. Fruit and fruit juices. Milk and yogurt. Sweets and snack foods, such as cake, cookies, candy, chips, and soft drinks. How do I count carbohydrates in foods? There are two ways to count carbohydrates in food. You can read food labels or learn standard serving sizes of foods. You can use either of the methods or acombination of both. Using the Nutrition Facts label The Nutrition Facts list is included on the labels of almost all packaged foods and beverages in the [place] It includes: The serving size. Information about nutrients in each serving, including the grams (g) of carbohydrate per serving. To use the Nutrition Facts: Decide how many servings you will have. Multiply the number of servings by the number of carbohydrates per serving. The resulting number is the total amount of carbohydrates that you will be having. Learning the standard serving sizes of foods When you eat carbohydrate foods that are not packaged or do not include Nutrition Facts on the label, you need to measure the servings in order to count the  amount of carbohydrates. Measure the foods that you will eat with a food scale or measuring cup, if needed. Decide how many standard-size servings you will eat. Multiply the number of servings by 15. For foods that contain carbohydrates, one serving equals 15 g of carbohydrates. For example, if you eat 2 cups or 10 oz (300 g) of strawberries, you will have eaten 2 servings and 30 g of carbohydrates (2 servings x 15 g = 30 g). For foods that have more than one food mixed, such as soups and casseroles, you must count the carbohydrates in each food that is included. The following list contains standard serving sizes of common carbohydrate-rich foods. Each of these servings has about 15 g of carbohydrates: 1 slice of bread. 1 six-inch (15 cm) tortilla. ? cup or 2 oz (53 g) cooked rice or pasta.  cup or 3 oz (85 g) cooked or canned, drained and rinsed beans or lentils.  cup or 3 oz (85 g) starchy vegetable, such as peas, corn, or squash.  cup or 4 oz (120 g) hot cereal.  cup or 3 oz (85 g) boiled or mashed potatoes, or  or 3 oz (85 g) of a large baked potato.  cup or 4 fl oz (118 mL) fruit juice. 1 cup or 8 fl oz (237 mL) milk. 1 small or 4 oz (106 g) apple.  or 2 oz (63 g) of a medium banana. 1 cup or 5 oz (150 g) strawberries. 3 cups or 1 oz (24 g) popped popcorn. What is an example of carbohydrate counting? To calculate the number of carbohydrates in this sample meal, follow the stepsshown below. Sample meal 3 oz (85 g) chicken breast. ? cup or 4 oz (106 g) brown rice.    cup or 3 oz (85 g) corn. 1 cup or 8 fl oz (237 mL) milk. 1 cup or 5 oz (150 g) strawberries with sugar-free whipped topping. Carbohydrate calculation Identify the foods that contain carbohydrates: Rice. Corn. Milk. Strawberries. Calculate how many servings you have of each food: 2 servings rice. 1 serving corn. 1 serving milk. 1 serving strawberries. Multiply each number of servings by 15 g: 2 servings  rice x 15 g = 30 g. 1 serving corn x 15 g = 15 g. 1 serving milk x 15 g = 15 g. 1 serving strawberries x 15 g = 15 g. Add together all of the amounts to find the total grams of carbohydrates eaten: 30 g + 15 g + 15 g + 15 g = 75 g of carbohydrates total. What are tips for following this plan? Shopping Develop a meal plan and then make a shopping list. Buy fresh and frozen vegetables, fresh and frozen fruit, dairy, eggs, beans, lentils, and whole grains. Look at food labels. Choose foods that have more fiber and less sugar. Avoid processed foods and foods with added sugars. Meal planning Aim to have the same amount of carbohydrates at each meal and for each snack time. Plan to have regular, balanced meals and snacks. Where to find more information American Diabetes Association: www.diabetes.org Centers for Disease Control and Prevention: www.cdc.gov Summary Carbohydrate counting is a method of keeping track of how many carbohydrates you eat. Eating carbohydrates naturally increases the amount of sugar (glucose) in the blood. Counting how many carbohydrates you eat improves your blood glucose control, which helps you manage your diabetes. A dietitian can help you make a meal plan and calculate how many carbohydrates you should have at each meal and snack. This information is not intended to replace advice given to you by your health care provider. Make sure you discuss any questions you have with your healthcare provider. Document Revised: 12/08/2019 Document Reviewed: 12/09/2019 Elsevier Patient Education  2021 [employer]  

## 2021-07-26 NOTE — Assessment & Plan Note (Signed)
Encourage heart healthy diet such as MIND or DASH diet, increase exercise, avoid trans fats, simple carbohydrates and processed foods, consider a krill or fish or flaxseed oil cap daily.  °

## 2021-07-26 NOTE — Assessment & Plan Note (Signed)
Well controlled, no changes to meds. Encouraged heart healthy diet such as the DASH diet and exercise as tolerated.  °

## 2021-07-26 NOTE — Progress Notes (Addendum)
Subjective:   By signing my name below, I, Zite Okoli, attest that this documentation has been prepared under the direction and in the presence of Ann Held, DO  07/26/2021   [name], female    DOB: 10-14-1946, 75 y.o.   MRN: [id number]  Chief Complaint  Patient presents with   Annual Exam    HPI Patient is in today for an office visit and yearly follow-up.  She reports she is doing well. She reports her sugar levels have been good. This morning, it was 111 and yesterday, it was 105. She has been managing the levels with 850 mg metformin PO 3x daily and rybelsus.   She mentions she has had no recent LE edema and she has lost 10 pounds since starting 20 mg Lasix PO daily and rybelsus.    She has 3 Pfizer Covid-19 vaccines at this time. She is UTD on tetanus vaccines. She is UTD on bone density screening and mammogram. She is UTD on vision and dental care.  Past Medical History:  Diagnosis Date   Diabetes mellitus without complication (HCC)    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   HYPERTENSION    MITRAL VALVE PROLAPSE    OSTEOPENIA    Overweight(278.02)    PHARYNGITIS, ACUTE     Past Surgical History:  Procedure Laterality Date   ABDOMINAL HYSTERECTOMY     CHOLECYSTECTOMY      Family History  Problem Relation Age of Onset   Emphysema Father    Diabetes Father    Heart disease Mother        Had a stent   Hypertension Other    Colon cancer Neg Hx    BRCA 1/2 Neg Hx    Breast cancer Neg Hx     Social History   Socioeconomic History   Marital status: Married    Spouse name: Not on file   Number of children: Not on file   Years of education: Not on file   Highest education level: Not on file  Occupational History   Occupation: hairdresser  Tobacco Use   Smoking status: Never   Smokeless tobacco: Never  Vaping Use   Vaping Use: Never used  Substance and Sexual Activity   Alcohol use: No    Alcohol/week: 0.0 standard  drinks   Drug use: No   Sexual activity: Not Currently  Other Topics Concern   Not on file  Social History Narrative   Not on file   Social Determinants of Health   Financial Resource Strain: Low Risk    Difficulty of Paying Living Expenses: Not hard at all  Food Insecurity: No Food Insecurity   Worried About Charity fundraiser in the Last Year: Never true   Ran Out of Food in the Last Year: Never true  Transportation Needs: No Transportation Needs   Lack of Transportation (Medical): No   Lack of Transportation (Non-Medical): No  Physical Activity: Inactive   Days of Exercise per Week: 0 days   Minutes of Exercise per Session: 0 min  Stress: No Stress Concern Present   Feeling of Stress : Not at all  Social Connections: Socially Integrated   Frequency of Communication with Friends and Family: More than three times a week   Frequency of Social Gatherings with Friends and Family: More than three times a week   Attends Religious Services: More than 4 times per year   Active Member of  Clubs or Organizations: Yes   Attends Music therapist: More than 4 times per year   Marital Status: Married  Human resources officer Violence: Not At Risk   Fear of Current or Ex-Partner: No   Emotionally Abused: No   Physically Abused: No   Sexually Abused: No    Outpatient Medications Prior to Visit  Medication Sig Dispense Refill   amoxicillin (AMOXIL) 500 MG capsule Take 500 mg by mouth. For dental procedures     aspirin EC 81 MG tablet Take 1 tablet (81 mg total) by mouth daily. 90 tablet 3   Biotin 1000 MCG tablet Take 5,000 mcg by mouth daily.     Blood Glucose Monitoring Suppl (ONE TOUCH ULTRA 2) w/Device KIT CHECK BLOOD SUGAR TWICE DAILY.  DX CODE  T15.7 1 kit 0   BYSTOLIC 5 MG tablet TAKE ONE TABLET BY MOUTH EVERY DAY 90 tablet 3   calcium carbonate (OSCAL) 1500 (600 Ca) MG TABS tablet Take 600 mg of elemental calcium by mouth daily with breakfast. Pt takes 1 tablet 1 to 2 times  a week due to side effects of bumps in mouth due to intolerance of coating on the tablets.     CRESTOR 20 MG tablet TAKE ONE TABLET BY MOUTH EVERY DAY 90 tablet 3   Cyanocobalamin (VITAMIN B-12 PO) Take 1 tablet by mouth daily.      fexofenadine (ALLEGRA) 180 MG tablet Take 180 mg by mouth daily.     furosemide (LASIX) 20 MG tablet TAKE ONE-HALF TABLET BY MOUTH EVERY DAY 45 tablet 3   Lancets (ONETOUCH DELICA PLUS [id number]) MISC USE 1 LANCET TO TEST AS DIRECTED (DISCARD LANCET IN SHARPS CONTAINER IMMEDIATELY AFTER USE) 200 each 1   losartan (COZAAR) 25 MG tablet TAKE ONE TABLET BY MOUTH EVERY DAY 90 tablet 3   magnesium oxide (MAG-OX) 400 MG tablet Take 400 mg by mouth 3 (three) times a week.     Multiple Vitamins-Minerals (PRESERVISION AREDS 2) CAPS Take by mouth.     ONETOUCH ULTRA test strip USE ONE STRIP TO TEST TWICE A DAY - (KEEP UNUSED STRIPS IN ORIGINAL SEALED CONTAINER BETWEEN USES) 200 each 1   RYBELSUS 7 MG TABS TAKE ONE TABLET BY MOUTH EVERY DAY - (TAKE WITH NO MORE THAN 4 OUNCES OF PLAIN WATER AT LEAST 30 MINUTES BEFORE THE FIRST FOOD, BEVERAGE, OR OTHER MEDICATIONS OF THE DAY) 90 tablet 1   VITAMIN D, CHOLECALCIFEROL, PO Take 25 mcg by mouth daily.     metFORMIN (GLUCOPHAGE) 850 MG tablet TAKE ONE TABLET BY MOUTH THREE TIMES A DAY WITH FOOD 270 tablet 1   No facility-administered medications prior to visit.    Allergies  Allergen Reactions   Mucinex [Guaifenesin Er] Anaphylaxis   Codeine Nausea And Vomiting   Advicor [Niacin-Lovastatin Er] Rash   Amlodipine Rash   Lisinopril-Hydrochlorothiazide Rash   Other Rash    Eye drops with preservatives.     Ramipril Rash   Sulfonamide Derivatives Rash    Review of Systems  Constitutional:  Negative for chills, fever and malaise/fatigue.  HENT:  Negative for congestion and hearing loss.   Eyes:  Negative for discharge.  Respiratory:  Negative for cough, sputum production and shortness of breath.   Cardiovascular:  Negative for  chest pain, palpitations and leg swelling.  Gastrointestinal:  Negative for abdominal pain, blood in stool, constipation, diarrhea, heartburn, nausea and vomiting.  Genitourinary:  Negative for dysuria, frequency, hematuria and urgency.  Musculoskeletal:  Negative for  back pain, falls and myalgias.  Skin:  Negative for rash.  Neurological:  Negative for dizziness, sensory change, loss of consciousness, weakness and headaches.  Endo/Heme/Allergies:  Negative for environmental allergies. Does not bruise/bleed easily.  Psychiatric/Behavioral:  Negative for depression and suicidal ideas. The patient is not nervous/anxious and does not have insomnia.       Objective:      BP 124/72 (BP Location: Left Arm, Patient Position: Sitting, Cuff Size: Normal)   Pulse 66   Ht '5\' 2"'  (1.575 m)   Wt 126 lb (57.2 kg)   SpO2 98%   BMI 23.05 kg/m  Wt Readings from Last 3 Encounters:  07/26/21 126 lb (57.2 kg)  07/23/21 124 lb 6.4 oz (56.4 kg)  01/07/21 127 lb 6.4 oz (57.8 kg)    Diabetic Foot Exam - Simple   Simple Foot Form Diabetic Foot exam was performed with the following findings: Yes 07/26/2021  8:41 AM  Visual Inspection No deformities, no ulcerations, no other skin breakdown bilaterally: Yes Sensation Testing Intact to touch and monofilament testing bilaterally: Yes Pulse Check Posterior Tibialis and Dorsalis pulse intact bilaterally: Yes Comments    Lab Results  Component Value Date   WBC 6.0 09/25/2016   HGB 13.0 09/25/2016   HCT 38.8 09/25/2016   PLT 257.0 09/25/2016   GLUCOSE 90 01/17/2021   CHOL 90 01/17/2021   TRIG 105.0 01/17/2021   HDL 35.90 (L) 01/17/2021   LDLDIRECT 53.0 05/25/2018   LDLCALC 33 01/17/2021   ALT 15 01/17/2021   AST 18 01/17/2021   NA 139 01/17/2021   K 4.0 01/17/2021   CL 104 01/17/2021   CREATININE 0.81 01/17/2021   BUN 16 01/17/2021   CO2 29 01/17/2021   HGBA1C 6.4 01/17/2021   MICROALBUR 1.1 01/17/2021    No results found for: TSH Lab  Results  Component Value Date   WBC 6.0 09/25/2016   HGB 13.0 09/25/2016   HCT 38.8 09/25/2016   MCV 88.1 09/25/2016   PLT 257.0 09/25/2016   Lab Results  Component Value Date   NA 139 01/17/2021   K 4.0 01/17/2021   CO2 29 01/17/2021   GLUCOSE 90 01/17/2021   BUN 16 01/17/2021   CREATININE 0.81 01/17/2021   BILITOT 0.5 01/17/2021   ALKPHOS 33 (L) 01/17/2021   AST 18 01/17/2021   ALT 15 01/17/2021   PROT 6.5 01/17/2021   ALBUMIN 4.2 01/17/2021   CALCIUM 9.2 01/17/2021   GFR 72.19 01/17/2021   Lab Results  Component Value Date   CHOL 90 01/17/2021   Lab Results  Component Value Date   HDL 35.90 (L) 01/17/2021   Lab Results  Component Value Date   LDLCALC 33 01/17/2021   Lab Results  Component Value Date   TRIG 105.0 01/17/2021   Lab Results  Component Value Date   CHOLHDL 3 01/17/2021   Lab Results  Component Value Date   HGBA1C 6.4 01/17/2021       Assessment & Plan:   Problem List Items Addressed This Visit       Unprioritized   Essential hypertension    Well controlled, no changes to meds. Encouraged heart healthy diet such as the DASH diet and exercise as tolerated.         Hyperlipidemia LDL goal <70    Encourage heart healthy diet such as MIND or DASH diet, increase exercise, avoid trans fats, simple carbohydrates and processed foods, consider a krill or fish or flaxseed oil cap daily.  Type 2 diabetes mellitus not at goal [facility])    hgba1c to be checked, minimize simple carbs. Increase exercise as tolerated. Continue current meds       Relevant Medications   metFORMIN (GLUCOPHAGE) 850 MG tablet   Type 2 diabetes mellitus with hyperglycemia, without long-term current use of insulin ([place])    hgba1c to be checked, minimize simple carbs. Increase exercise as tolerated. Continue current meds        Relevant Medications   metFORMIN (GLUCOPHAGE) 850 MG tablet   Other Relevant Orders   Lipid panel   Hemoglobin A1c   Comprehensive  metabolic panel   Other Visit Diagnoses     Primary hypertension    -  Primary   Relevant Orders   Lipid panel   Hemoglobin A1c   Comprehensive metabolic panel   Hyperlipidemia associated with type 2 diabetes mellitus (HCC)       Relevant Medications   metFORMIN (GLUCOPHAGE) 850 MG tablet   Other Relevant Orders   Lipid panel   Hemoglobin A1c   Comprehensive metabolic panel   Uncontrolled type 2 diabetes mellitus with hyperglycemia (HCC)       Relevant Medications   metFORMIN (GLUCOPHAGE) 850 MG tablet       Meds ordered this encounter  Medications   metFORMIN (GLUCOPHAGE) 850 MG tablet    Sig: TAKE ONE TABLET BY MOUTH THREE TIMES A DAY WITH FOOD    Dispense:  270 tablet    Refill:  1     I,Zite Okoli,acting as a Education administrator for [employer], DO.,have documented all relevant documentation on the behalf of Ann Held, DO,as directed by  Ann Held, DO while in the presence of [place], DO, personally preformed the services described in this documentation.  All medical record entries made by the scribe were at my direction and in my presence.  I have reviewed the chart and discharge instructions (if applicable) and agree that the record reflects my personal performance and is accurate and complete. 07/26/2021

## 2021-07-26 NOTE — Assessment & Plan Note (Signed)
hgba1c to be checked, minimize simple carbs. Increase exercise as tolerated. Continue current meds  

## 2021-09-05 ENCOUNTER — Other Ambulatory Visit: Payer: Self-pay | Admitting: Family Medicine

## 2021-09-05 DIAGNOSIS — Z1231 Encounter for screening mammogram for malignant neoplasm of breast: Secondary | ICD-10-CM

## 2021-09-23 DIAGNOSIS — Z23 Encounter for immunization: Secondary | ICD-10-CM | POA: Diagnosis not present

## 2021-10-21 ENCOUNTER — Ambulatory Visit
Admission: RE | Admit: 2021-10-21 | Discharge: 2021-10-21 | Disposition: A | Discharge status: Home / Self Care | Payer: Medicare Other | Source: Ambulatory Visit | Attending: Family Medicine | Admitting: Family Medicine

## 2021-10-21 ENCOUNTER — Other Ambulatory Visit: Payer: Self-pay

## 2021-10-21 DIAGNOSIS — Z1231 Encounter for screening mammogram for malignant neoplasm of breast: Secondary | ICD-10-CM

## 2021-11-08 ENCOUNTER — Telehealth: Payer: Self-pay | Admitting: Cardiovascular Disease

## 2021-11-08 NOTE — Telephone Encounter (Signed)
   Pt said she was told by Dr. Johnsie Cancel if she cant get any appts for her routine f/u to talk to his nurse. She wants to speak with Dr. Kyla Balzarine nurse

## 2021-11-18 NOTE — Telephone Encounter (Signed)
Cynthia Burns is calling back due to no one calling her back about being worked in.

## 2021-11-18 NOTE — Telephone Encounter (Signed)
Returned call to Pt.  Scheduled to see Dr. Johnsie Cancel in December.  She needed sooner appointment because her veteran husband is scheduled for ankle surgery in February.

## 2021-11-20 ENCOUNTER — Other Ambulatory Visit: Payer: Self-pay | Admitting: Cardiovascular Disease

## 2021-11-20 ENCOUNTER — Other Ambulatory Visit: Payer: Self-pay | Admitting: Family Medicine

## 2021-11-20 DIAGNOSIS — I10 Essential (primary) hypertension: Secondary | ICD-10-CM

## 2021-11-20 NOTE — Progress Notes (Signed)
[name], female   DOB: 21-Jul-1946, 74 y.o.   MRN: [id number]     Cynthia Burns returns today for followup. 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  All other systems reviewed and negative  General: BP 132/70   Pulse 69   Ht _0  (1.575 m)   Wt 124 lb (56.2 kg)   SpO2 98%   BMI 22.68 kg/m  Affect appropriate Healthy:  appears stated age 41: normal Neck supple with no adenopathy JVP normal no bruits no thyromegaly Lungs clear with no wheezing and good diaphragmatic motion Heart:  S1/S2 no murmur, no rub, gallop or click PMI normal Abdomen: benighn, post lap choly  Distal pulses intact with no bruits No edema Neuro non-focal Skin warm and dry No muscular weakness    Current Outpatient Medications  Medication Sig Dispense Refill   amoxicillin (AMOXIL) 500 MG capsule Take 500 mg by mouth. For dental procedures     aspirin EC 81 MG tablet Take 1 tablet (81 mg total) by mouth daily. 90 tablet 3   Biotin 1000 MCG tablet Take 5,000 mcg by mouth daily.     Blood Glucose Monitoring Suppl (ONE TOUCH ULTRA 2) w/Device KIT CHECK BLOOD SUGAR TWICE DAILY.  DX CODE  E11.9 1 kit 0   calcium carbonate (OSCAL) 1500 (600 Ca) MG TABS  tablet Take 600 mg of elemental calcium by mouth daily with breakfast. Pt takes 1 tablet 1 to 2 times a week due to side effects of bumps in mouth due to intolerance of coating on the tablets.     Cyanocobalamin (VITAMIN B-12 PO) Take 1 tablet by mouth daily.      fexofenadine (ALLEGRA) 180 MG tablet Take 180 mg by mouth daily.     furosemide (LASIX) 20 MG tablet Take 0.5 tablets (10 mg total) by mouth daily. Pt needs to keep upcoming appt in Dec for further refills 15 tablet 0   glucose blood (ONETOUCH ULTRA) test strip Check blood sugars twice daily 200 each 12   Lancets (ONETOUCH DELICA PLUS [id number]) MISC USE 1 LANCET TO TEST AS DIRECTED (DISCARD LANCET IN SHARPS CONTAINER IMMEDIATELY AFTER USE) 200 each 1   losartan (COZAAR) 25 MG tablet Take 1 tablet (25 mg total) by mouth daily. Pt needs to keep upcoming appt in Dec for further refills 30 tablet 0   magnesium oxide (MAG-OX) 400 MG tablet Take 400 mg by mouth 3 (three) times a week.     metFORMIN (GLUCOPHAGE) 850 MG tablet TAKE ONE TABLET BY MOUTH THREE TIMES A DAY WITH FOOD 270 tablet 1   Multiple Vitamins-Minerals (PRESERVISION AREDS 2) CAPS Take by mouth.     nebivolol (BYSTOLIC) 5  MG tablet Take 1 tablet (5 mg total) by mouth daily. Pt needs to keep upcoming appt in Dec for further refills 30 tablet 0   rosuvastatin (CRESTOR) 20 MG tablet Take 1 tablet (20 mg total) by mouth daily. Pt needs to keep upcoming appt in Dec for further refills 30 tablet 0   Semaglutide (RYBELSUS) 7 MG TABS Take 1 tablet by mouth daily. 90 tablet 1   VITAMIN D, CHOLECALCIFEROL, PO Take 25 mcg by mouth daily.     No current facility-administered medications for this visit.    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  Target hemoglobin A1c is 6.5 or less.  Continue current medications. A1c 6.3  Obesity: Exercise and low carb diet discussed Chol: on statin LDL 42 07/26/21  HTN:  Well controlled.  Continue current medications and low sodium Dash type diet.   Edema: stable low dose lasix low sodium diet  GB: post lap choly with improved symptoms    Jenkins Rouge

## 2021-11-26 ENCOUNTER — Ambulatory Visit (INDEPENDENT_AMBULATORY_CARE_PROVIDER_SITE_OTHER): Payer: Medicare Other | Admitting: Cardiovascular Disease

## 2021-11-26 ENCOUNTER — Encounter: Payer: Self-pay | Admitting: Cardiovascular Disease

## 2021-11-26 ENCOUNTER — Other Ambulatory Visit: Payer: Self-pay

## 2021-11-26 VITALS — BP 132/70 | HR 69 | Ht 62.0 in | Wt 124.0 lb

## 2021-11-26 DIAGNOSIS — I7121 Aneurysm of the ascending aorta, without rupture: Secondary | ICD-10-CM

## 2021-11-26 DIAGNOSIS — I10 Essential (primary) hypertension: Secondary | ICD-10-CM

## 2021-11-26 NOTE — Patient Instructions (Signed)
Medication Instructions:  Your physician recommends that you continue on your current medications as directed. Please refer to the Current Medication list given to you today.  *If you need a refill on your cardiac medications before your next appointment, please call your pharmacy*   Lab Work: NONE If you have labs (blood work) drawn today and your tests are completely normal, you will receive your results only by: [place] (if you have MyChart) OR A paper copy in the mail If you have any lab test that is abnormal or we need to change your treatment, we will call you to review the results.   Testing/Procedures: NONE   Follow-Up: At [facility], you and your health needs are our priority.  As part of our continuing mission to provide you with exceptional heart care, we have created designated Provider Care Teams.  These Care Teams include your primary Cardiologist (physician) and Advanced Practice Providers (APPs -  Physician Assistants and Nurse Practitioners) who all work together to provide you with the care you need, when you need it.    Your next appointment:   1 year(s)  The format for your next appointment:   In Person  Provider:   Jenkins Rouge, MD {

## 2022-01-23 DIAGNOSIS — L821 Other seborrheic keratosis: Secondary | ICD-10-CM | POA: Diagnosis not present

## 2022-01-23 DIAGNOSIS — L814 Other melanin hyperpigmentation: Secondary | ICD-10-CM | POA: Diagnosis not present

## 2022-01-23 DIAGNOSIS — D225 Melanocytic nevi of trunk: Secondary | ICD-10-CM | POA: Diagnosis not present

## 2022-01-23 DIAGNOSIS — T887XXA Unspecified adverse effect of drug or medicament, initial encounter: Secondary | ICD-10-CM | POA: Diagnosis not present

## 2022-01-23 DIAGNOSIS — I788 Other diseases of capillaries: Secondary | ICD-10-CM | POA: Diagnosis not present

## 2022-01-23 DIAGNOSIS — L280 Lichen simplex chronicus: Secondary | ICD-10-CM | POA: Diagnosis not present

## 2022-01-28 ENCOUNTER — Other Ambulatory Visit: Payer: Self-pay | Admitting: *Deleted

## 2022-01-28 ENCOUNTER — Other Ambulatory Visit: Payer: Self-pay | Admitting: Family Medicine

## 2022-01-28 DIAGNOSIS — I10 Essential (primary) hypertension: Secondary | ICD-10-CM

## 2022-01-28 DIAGNOSIS — E1165 Type 2 diabetes mellitus with hyperglycemia: Secondary | ICD-10-CM

## 2022-01-28 MED ORDER — LOSARTAN POTASSIUM 25 MG PO TABS: 25.0000 mg | ORAL_TABLET | Freq: Every day | ORAL | 3 refills | Status: DC

## 2022-01-28 MED ORDER — FUROSEMIDE 20 MG PO TABS: 10.0000 mg | ORAL_TABLET | Freq: Every day | ORAL | 3 refills | Status: DC

## 2022-01-28 MED ORDER — ROSUVASTATIN CALCIUM 20 MG PO TABS: 20.0000 mg | ORAL_TABLET | Freq: Every day | ORAL | 3 refills | Status: DC

## 2022-01-28 MED ORDER — NEBIVOLOL HCL 5 MG PO TABS: 5.0000 mg | ORAL_TABLET | Freq: Every day | ORAL | 3 refills | Status: DC

## 2022-02-06 ENCOUNTER — Ambulatory Visit (INDEPENDENT_AMBULATORY_CARE_PROVIDER_SITE_OTHER): Payer: Medicare Other | Admitting: Family Medicine

## 2022-02-06 ENCOUNTER — Encounter: Payer: Self-pay | Admitting: Family Medicine

## 2022-02-06 VITALS — BP 120/86 | HR 71 | Temp 97.8°F | Resp 18 | Ht 62.0 in | Wt 121.2 lb

## 2022-02-06 DIAGNOSIS — E559 Vitamin D deficiency, unspecified: Secondary | ICD-10-CM

## 2022-02-06 DIAGNOSIS — E1169 Type 2 diabetes mellitus with other specified complication: Secondary | ICD-10-CM

## 2022-02-06 DIAGNOSIS — E1165 Type 2 diabetes mellitus with hyperglycemia: Secondary | ICD-10-CM | POA: Diagnosis not present

## 2022-02-06 DIAGNOSIS — I10 Essential (primary) hypertension: Secondary | ICD-10-CM

## 2022-02-06 DIAGNOSIS — E785 Hyperlipidemia, unspecified: Secondary | ICD-10-CM

## 2022-02-06 LAB — CBC WITH DIFFERENTIAL/PLATELET
Basophils Absolute: 0 10*3/uL (ref 0.0–0.1)
Basophils Relative: 0.7 % (ref 0.0–3.0)
Eosinophils Absolute: 0.1 10*3/uL (ref 0.0–0.7)
Eosinophils Relative: 1.3 % (ref 0.0–5.0)
HCT: 36 % (ref 36.0–46.0)
Hemoglobin: 12 g/dL (ref 12.0–15.0)
Lymphocytes Relative: 30.5 % (ref 12.0–46.0)
Lymphs Abs: 2 10*3/uL (ref 0.7–4.0)
MCHC: 33.4 g/dL (ref 30.0–36.0)
MCV: 91.9 fl (ref 78.0–100.0)
Monocytes Absolute: 0.5 10*3/uL (ref 0.1–1.0)
Monocytes Relative: 7.6 % (ref 3.0–12.0)
Neutro Abs: 3.9 10*3/uL (ref 1.4–7.7)
Neutrophils Relative %: 59.9 % (ref 43.0–77.0)
Platelets: 237 10*3/uL (ref 150.0–400.0)
RBC: 3.92 Mil/uL (ref 3.87–5.11)
RDW: 13.2 % (ref 11.5–15.5)
WBC: 6.6 10*3/uL (ref 4.0–10.5)

## 2022-02-06 LAB — LIPID PANEL
Cholesterol: 102 mg/dL (ref 0–200)
HDL: 43.1 mg/dL (ref >39.00)
LDL Cholesterol: 39 mg/dL (ref 0–99)
NonHDL: 58.92
Total CHOL/HDL Ratio: 2
Triglycerides: 101 mg/dL (ref 0.0–149.0)
VLDL: 20.2 mg/dL (ref 0.0–40.0)

## 2022-02-06 LAB — COMPREHENSIVE METABOLIC PANEL
ALT: 17 U/L (ref 0–35)
AST: 20 U/L (ref 0–37)
Albumin: 4.4 g/dL (ref 3.5–5.2)
Alkaline Phosphatase: 36 U/L — ABNORMAL LOW (ref 39–117)
BUN: 16 mg/dL (ref 6–23)
CO2: 33 mEq/L — ABNORMAL HIGH (ref 19–32)
Calcium: 9.6 mg/dL (ref 8.4–10.5)
Chloride: 102 mEq/L (ref 96–112)
Creatinine, Ser: 0.78 mg/dL (ref 0.40–1.20)
GFR: 74.97 mL/min (ref >60.00)
Glucose, Bld: 91 mg/dL (ref 70–99)
Potassium: 4 mEq/L (ref 3.5–5.1)
Sodium: 138 mEq/L (ref 135–145)
Total Bilirubin: 0.5 mg/dL (ref 0.2–1.2)
Total Protein: 7.6 g/dL (ref 6.0–8.3)

## 2022-02-06 LAB — MICROALBUMIN / CREATININE URINE RATIO
Creatinine,U: 109.3 mg/dL
Microalb Creat Ratio: 1.2 mg/g (ref 0.0–30.0)
Microalb, Ur: 1.3 mg/dL (ref 0.0–1.9)

## 2022-02-06 LAB — VITAMIN D 25 HYDROXY (VIT D DEFICIENCY, FRACTURES): VITD: 52.56 ng/mL (ref 30.00–100.00)

## 2022-02-06 LAB — HEMOGLOBIN A1C: Hgb A1c MFr Bld: 6.2 % (ref 4.6–6.5)

## 2022-02-06 MED ORDER — RYBELSUS 7 MG PO TABS: 1.0000 | ORAL_TABLET | Freq: Every day | ORAL | 1 refills | Status: DC

## 2022-02-06 MED ORDER — METFORMIN HCL 850 MG PO TABS: ORAL_TABLET | ORAL | 0 refills | Status: DC

## 2022-02-06 NOTE — Progress Notes (Addendum)
Subjective:   By signing my name below, I, Shehryar Baig, attest that this documentation has been prepared under the direction and in the presence of Ann Held, DO  02/06/2022    [name], female    DOB: [birth year], 76 y.o.   MRN: [id number]  Chief Complaint  Patient presents with   Hypertension   Hyperlipidemia   Diabetes   Follow-up    Hypertension Pertinent negatives include no blurred vision, chest pain, headaches, malaise/fatigue, palpitations or shortness of breath.  Hyperlipidemia Pertinent negatives include no chest pain or shortness of breath.  Diabetes Pertinent negatives for hypoglycemia include no dizziness, headaches or nervousness/anxiousness. Pertinent negatives for diabetes include no blurred vision and no chest pain.  Patient is in today for a office visit.  She continues measuring her blood sugar regularly. Her blood sugars measured 98 this morning. She continues taking 7 mg rybelsus daily PO and reports she no longer feels nauseas while taking it. She has lost weight since taking rybelsus due to decreased appetite. She struggles to eat and finish her meals. She is requesting a refill on 7 mg rybelsus daily PO, 850 mg metformin daily PO.  Lab Results  Component Value Date   HGBA1C 6.3 07/26/2021   Wt Readings from Last 3 Encounters:  02/06/22 121 lb 3.2 oz (55 kg)  11/26/21 124 lb (56.2 kg)  07/26/21 126 lb (57.2 kg)   She continues seeing her cardiologist and dermatologist regularly. She is UTD on flu vaccines this year and reports receiving it at her pharmacy. She has 3 Covid-19 vaccines at this time and is not interested in receiving anymore.  She is UTD on dental care.    Past Medical History:  Diagnosis Date   Diabetes mellitus without complication (HCC)    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   HYPERTENSION    MITRAL VALVE PROLAPSE    OSTEOPENIA    Overweight(278.02)    PHARYNGITIS, ACUTE     Past Surgical  History:  Procedure Laterality Date   ABDOMINAL HYSTERECTOMY     CHOLECYSTECTOMY      Family History  Problem Relation Age of Onset   Emphysema Father    Diabetes Father    Heart disease Mother        Had a stent   Hypertension Other    Colon cancer Neg Hx    BRCA 1/2 Neg Hx    Breast cancer Neg Hx     Social History   Socioeconomic History   Marital status: Married    Spouse name: Not on file   Number of children: Not on file   Years of education: Not on file   Highest education level: Not on file  Occupational History   Occupation: hairdresser  Tobacco Use   Smoking status: Never   Smokeless tobacco: Never  Vaping Use   Vaping Use: Never used  Substance and Sexual Activity   Alcohol use: No    Alcohol/week: 0.0 standard drinks   Drug use: No   Sexual activity: Not Currently  Other Topics Concern   Not on file  Social History Narrative   Not on file   Social Determinants of Health   Financial Resource Strain: Low Risk    Difficulty of Paying Living Expenses: Not hard at all  Food Insecurity: No Food Insecurity   Worried About Charity fundraiser in the Last Year: Never true   Arboriculturist in  the Last Year: Never true  Transportation Needs: No Transportation Needs   Lack of Transportation (Medical): No   Lack of Transportation (Non-Medical): No  Physical Activity: Inactive   Days of Exercise per Week: 0 days   Minutes of Exercise per Session: 0 min  Stress: No Stress Concern Present   Feeling of Stress : Not at all  Social Connections: Socially Integrated   Frequency of Communication with Friends and Family: More than three times a week   Frequency of Social Gatherings with Friends and Family: More than three times a week   Attends Religious Services: More than 4 times per year   Active Member of [employer] or Organizations: Yes   Attends Music therapist: More than 4 times per year   Marital Status: Married  Human resources officer Violence: Not  At Risk   Fear of Current or Ex-Partner: No   Emotionally Abused: No   Physically Abused: No   Sexually Abused: No    Outpatient Medications Prior to Visit  Medication Sig Dispense Refill   aspirin EC 81 MG tablet Take 1 tablet (81 mg total) by mouth daily. 90 tablet 3   Biotin 1000 MCG tablet Take 5,000 mcg by mouth daily.     Blood Glucose Monitoring Suppl (ONE TOUCH ULTRA 2) w/Device KIT CHECK BLOOD SUGAR TWICE DAILY.  DX CODE  E11.9 1 kit 0   calcium carbonate (OSCAL) 1500 (600 Ca) MG TABS tablet Take 600 mg of elemental calcium by mouth daily with breakfast. Pt takes 1 tablet 1 to 2 times a week due to side effects of bumps in mouth due to intolerance of coating on the tablets.     Cyanocobalamin (VITAMIN B-12 PO) Take 1 tablet by mouth daily.      fexofenadine (ALLEGRA) 180 MG tablet Take 180 mg by mouth daily.     furosemide (LASIX) 20 MG tablet Take 0.5 tablets (10 mg total) by mouth daily. Pt needs to keep upcoming appt in Dec for further refills 90 tablet 3   glucose blood (ONETOUCH ULTRA) test strip Check blood sugars twice daily 200 each 12   Lancets (ONETOUCH DELICA PLUS [id number]) MISC USE 1 LANCET TO TEST AS DIRECTED (DISCARD LANCET IN SHARPS CONTAINER IMMEDIATELY AFTER USE) 200 each 1   losartan (COZAAR) 25 MG tablet Take 1 tablet (25 mg total) by mouth daily. Pt needs to keep upcoming appt in Dec for further refills 90 tablet 3   magnesium oxide (MAG-OX) 400 MG tablet Take 400 mg by mouth 3 (three) times a week.     Multiple Vitamins-Minerals (PRESERVISION AREDS 2) CAPS Take by mouth.     nebivolol (BYSTOLIC) 5 MG tablet Take 1 tablet (5 mg total) by mouth daily. Pt needs to keep upcoming appt in Dec for further refills 90 tablet 3   rosuvastatin (CRESTOR) 20 MG tablet Take 1 tablet (20 mg total) by mouth daily. Pt needs to keep upcoming appt in Dec for further refills 90 tablet 3   VITAMIN D, CHOLECALCIFEROL, PO Take 25 mcg by mouth daily.     metFORMIN (GLUCOPHAGE) 850 MG  tablet TAKE ONE TABLET BY MOUTH THREE TIMES A DAY WITH FOOD 270 tablet 0   Semaglutide (RYBELSUS) 7 MG TABS Take 1 tablet by mouth daily. 90 tablet 1   amoxicillin (AMOXIL) 500 MG capsule Take 500 mg by mouth. For dental procedures (Patient not taking: Reported on 02/06/2022)     No facility-administered medications prior to visit.    Allergies  Allergen Reactions   Mucinex [Guaifenesin Er] Anaphylaxis   Codeine Nausea And Vomiting   Advicor [Niacin-Lovastatin Er] Rash   Amlodipine Rash   Lisinopril-Hydrochlorothiazide Rash   Other Rash    Eye drops with preservatives.     Ramipril Rash   Sulfonamide Derivatives Rash    Review of Systems  Constitutional:  Negative for fever and malaise/fatigue.  HENT:  Negative for congestion.   Eyes:  Negative for blurred vision.  Respiratory:  Negative for shortness of breath.   Cardiovascular:  Negative for chest pain, palpitations and leg swelling.  Gastrointestinal:  Negative for abdominal pain, blood in stool and nausea.  Genitourinary:  Negative for dysuria and frequency.  Musculoskeletal:  Negative for falls.  Skin:  Negative for rash.  Neurological:  Negative for dizziness, loss of consciousness and headaches.  Endo/Heme/Allergies:  Negative for environmental allergies.  Psychiatric/Behavioral:  Negative for depression. The patient is not nervous/anxious.       Objective:    Physical Exam Vitals and nursing note reviewed.  Constitutional:      General: She is not in acute distress.    Appearance: Normal appearance. She is not ill-appearing.  HENT:     Head: Normocephalic and atraumatic.     Right Ear: External ear normal.     Left Ear: External ear normal.  Eyes:     Extraocular Movements: Extraocular movements intact.     Pupils: Pupils are equal, round, and reactive to light.  Cardiovascular:     Rate and Rhythm: Normal rate and regular rhythm.     Heart sounds: Normal heart sounds. No murmur heard.   No gallop.   Pulmonary:     Effort: Pulmonary effort is normal. No respiratory distress.     Breath sounds: Normal breath sounds. No wheezing or rales.  Feet:     Comments: Diabetic Foot Exam - Simple   Simple Foot Form Diabetic Foot exam was performed with the following findings: Yes  02/06/2022  8:37 AM  Visual Inspection No deformities, no ulcerations, no other skin breakdown bilaterally: Yes Sensation Testing Intact to touch and monofilament testing bilaterally: Yes Pulse Check Posterior Tibialis and Dorsalis pulse intact bilaterally: Yes Comments    Skin:    General: Skin is warm and dry.  Neurological:     Mental Status: She is alert and oriented to person, place, and time.  Psychiatric:        Behavior: Behavior normal.        Judgment: Judgment normal.    BP 120/86 (BP Location: Left Arm, Patient Position: Sitting, Cuff Size: Normal)    Pulse 71    Temp 97.8 F (36.6 C) (Oral)    Resp 18    Ht _0  (1.575 m)    Wt 121 lb 3.2 oz (55 kg)    SpO2 98%    BMI 22.17 kg/m  Wt Readings from Last 3 Encounters:  02/06/22 121 lb 3.2 oz (55 kg)  11/26/21 124 lb (56.2 kg)  07/26/21 126 lb (57.2 kg)    Diabetic Foot Exam - Simple   Simple Foot Form Diabetic Foot exam was performed with the following findings: Yes 02/06/2022  8:37 AM  Visual Inspection No deformities, no ulcerations, no other skin breakdown bilaterally: Yes Sensation Testing Intact to touch and monofilament testing bilaterally: Yes Pulse Check Posterior Tibialis and Dorsalis pulse intact bilaterally: Yes Comments    Lab Results  Component Value Date   WBC 6.0 09/25/2016   HGB 13.0 09/25/2016  HCT 38.8 09/25/2016   PLT 257.0 09/25/2016   GLUCOSE 97 07/26/2021   CHOL 100 07/26/2021   TRIG 86.0 07/26/2021   HDL 40.80 07/26/2021   LDLDIRECT 53.0 05/25/2018   LDLCALC 42 07/26/2021   ALT 18 07/26/2021   AST 20 07/26/2021   NA 138 07/26/2021   K 4.1 07/26/2021   CL 102 07/26/2021   CREATININE 0.79 07/26/2021    BUN 19 07/26/2021   CO2 31 07/26/2021   HGBA1C 6.3 07/26/2021   MICROALBUR 1.1 01/17/2021    No results found for: TSH Lab Results  Component Value Date   WBC 6.0 09/25/2016   HGB 13.0 09/25/2016   HCT 38.8 09/25/2016   MCV 88.1 09/25/2016   PLT 257.0 09/25/2016   Lab Results  Component Value Date   NA 138 07/26/2021   K 4.1 07/26/2021   CO2 31 07/26/2021   GLUCOSE 97 07/26/2021   BUN 19 07/26/2021   CREATININE 0.79 07/26/2021   BILITOT 0.4 07/26/2021   ALKPHOS 37 (L) 07/26/2021   AST 20 07/26/2021   ALT 18 07/26/2021   PROT 7.0 07/26/2021   ALBUMIN 4.3 07/26/2021   CALCIUM 9.4 07/26/2021   GFR 74.11 07/26/2021   Lab Results  Component Value Date   CHOL 100 07/26/2021   Lab Results  Component Value Date   HDL 40.80 07/26/2021   Lab Results  Component Value Date   LDLCALC 42 07/26/2021   Lab Results  Component Value Date   TRIG 86.0 07/26/2021   Lab Results  Component Value Date   CHOLHDL 2 07/26/2021   Lab Results  Component Value Date   HGBA1C 6.3 07/26/2021       Assessment & Plan:   Problem List Items Addressed This Visit       Unprioritized   Essential hypertension    Well controlled, no changes to meds. Encouraged heart healthy diet such as the DASH diet and exercise as tolerated.       Relevant Orders   Comprehensive metabolic panel   CBC with Differential/Platelet   Hemoglobin A1c   Microalbumin / creatinine urine ratio   Hyperlipidemia LDL goal <70    Encourage heart healthy diet such as MIND or DASH diet, increase exercise, avoid trans fats, simple carbohydrates and processed foods, consider a krill or fish or flaxseed oil cap daily.       Type 2 diabetes mellitus with hyperglycemia, without long-term current use of insulin (HCC)    hgba1c to be checked , minimize simple carbs. Increase exercise as tolerated. Continue current meds       Relevant Medications   metFORMIN (GLUCOPHAGE) 850 MG tablet   Semaglutide (RYBELSUS) 7 MG  TABS   Other Relevant Orders   Lipid panel   Comprehensive metabolic panel   CBC with Differential/Platelet   Hemoglobin A1c   Microalbumin / creatinine urine ratio   Other Visit Diagnoses     Hyperlipidemia associated with type 2 diabetes mellitus ([place])    -  Primary   Relevant Medications   metFORMIN (GLUCOPHAGE) 850 MG tablet   Semaglutide (RYBELSUS) 7 MG TABS   Other Relevant Orders   Comprehensive metabolic panel   CBC with Differential/Platelet   Hemoglobin A1c   Microalbumin / creatinine urine ratio   Uncontrolled type 2 diabetes mellitus with hyperglycemia (HCC)       Relevant Medications   metFORMIN (GLUCOPHAGE) 850 MG tablet   Semaglutide (RYBELSUS) 7 MG TABS   Vitamin D deficiency  Relevant Orders   VITAMIN D 25 Hydroxy (Vit-D Deficiency, Fractures)        Meds ordered this encounter  Medications   metFORMIN (GLUCOPHAGE) 850 MG tablet    Sig: 1 po tid    Dispense:  270 tablet    Refill:  0   Semaglutide (RYBELSUS) 7 MG TABS    Sig: Take 1 tablet by mouth daily.    Dispense:  90 tablet    Refill:  1    I, Lowne Chase, [employer], DO, personally preformed the services described in this documentation.  All medical record entries made by the scribe were at my direction and in my presence.  I have reviewed the chart and discharge instructions (if applicable) and agree that the record reflects my personal performance and is accurate and complete. 02/06/2022   I,Shehryar Baig,acting as a scribe for Ann Held, DO.,have documented all relevant documentation on the behalf of Ann Held, DO,as directed by  Ann Held, DO while in the presence of Ann Held, DO.   Ann Held, DO

## 2022-02-06 NOTE — Patient Instructions (Signed)

## 2022-02-06 NOTE — Assessment & Plan Note (Signed)
Well controlled, no changes to meds. Encouraged heart healthy diet such as the DASH diet and exercise as tolerated.  °

## 2022-02-06 NOTE — Assessment & Plan Note (Signed)
Encourage heart healthy diet such as MIND or DASH diet, increase exercise, avoid trans fats, simple carbohydrates and processed foods, consider a krill or fish or flaxseed oil cap daily.  °

## 2022-02-06 NOTE — Assessment & Plan Note (Signed)
hgba1c to be checked, minimize simple carbs. Increase exercise as tolerated. Continue current meds  

## 2022-02-10 ENCOUNTER — Ambulatory Visit: Payer: Medicare Other | Admitting: Cardiovascular Disease

## 2022-02-14 ENCOUNTER — Telehealth: Payer: Self-pay | Admitting: Family Medicine

## 2022-02-14 NOTE — Telephone Encounter (Signed)
Pt has a lot of questions regarding her labs results. Tried to set up an ov or vv with Dr.Lowne to discuss labs more in depth, she refused. She would like to know what labs tested for, especially if they tested for iron. Has questions about her alkaline levels as well. She is also wondering is she needs to take supplements based on labs. Please advise pt.

## 2022-02-14 NOTE — Telephone Encounter (Signed)
Please advise. Pt was advised to schedule virtual to discuss labs but refused.

## 2022-02-18 NOTE — Telephone Encounter (Signed)
Spoke with patient. Pt wanted to know if she should get her thyroid checked since she has been more tired recently. It appears pt has not had a TSH done. Please advise

## 2022-02-19 ENCOUNTER — Other Ambulatory Visit: Payer: Self-pay | Admitting: Family Medicine

## 2022-02-19 DIAGNOSIS — R5383 Other fatigue: Secondary | ICD-10-CM

## 2022-02-19 NOTE — Telephone Encounter (Signed)
Pt called. LVM advising patient about orders to schedule lab appointment ?

## 2022-02-24 ENCOUNTER — Other Ambulatory Visit (INDEPENDENT_AMBULATORY_CARE_PROVIDER_SITE_OTHER): Payer: Medicare Other

## 2022-02-24 DIAGNOSIS — R5383 Other fatigue: Secondary | ICD-10-CM

## 2022-02-24 DIAGNOSIS — E039 Hypothyroidism, unspecified: Secondary | ICD-10-CM

## 2022-02-24 LAB — CBC WITH DIFFERENTIAL/PLATELET
Basophils Absolute: 0 10*3/uL (ref 0.0–0.1)
Basophils Relative: 0.5 % (ref 0.0–3.0)
Eosinophils Absolute: 0.1 10*3/uL (ref 0.0–0.7)
Eosinophils Relative: 1.5 % (ref 0.0–5.0)
HCT: 35.2 % — ABNORMAL LOW (ref 36.0–46.0)
Hemoglobin: 11.9 g/dL — ABNORMAL LOW (ref 12.0–15.0)
Lymphocytes Relative: 32.8 % (ref 12.0–46.0)
Lymphs Abs: 2.6 10*3/uL (ref 0.7–4.0)
MCHC: 33.7 g/dL (ref 30.0–36.0)
MCV: 92.5 fl (ref 78.0–100.0)
Monocytes Absolute: 0.5 10*3/uL (ref 0.1–1.0)
Monocytes Relative: 6.9 % (ref 3.0–12.0)
Neutro Abs: 4.6 10*3/uL (ref 1.4–7.7)
Neutrophils Relative %: 58.3 % (ref 43.0–77.0)
Platelets: 250 10*3/uL (ref 150.0–400.0)
RBC: 3.81 Mil/uL — ABNORMAL LOW (ref 3.87–5.11)
RDW: 13.4 % (ref 11.5–15.5)
WBC: 7.9 10*3/uL (ref 4.0–10.5)

## 2022-02-25 ENCOUNTER — Other Ambulatory Visit: Payer: Self-pay | Admitting: Family Medicine

## 2022-02-25 DIAGNOSIS — E039 Hypothyroidism, unspecified: Secondary | ICD-10-CM

## 2022-02-25 LAB — THYROID PANEL WITH TSH
Free Thyroxine Index: 2 (ref 1.4–3.8)
T3 Uptake: 31 % (ref 22–35)
T4, Total: 6.6 ug/dL (ref 5.1–11.9)
TSH: 5.81 mIU/L — ABNORMAL HIGH (ref 0.40–4.50)

## 2022-02-25 MED ORDER — LEVOTHYROXINE SODIUM 25 MCG PO TABS: 25.0000 ug | ORAL_TABLET | Freq: Every day | ORAL | 2 refills | Status: DC

## 2022-02-25 NOTE — Addendum Note (Signed)
Addended byDamita Dunnings D on: 02/25/2022 11:15 AM ? ? Modules accepted: Orders ? ?

## 2022-04-11 ENCOUNTER — Other Ambulatory Visit: Payer: Self-pay | Admitting: Family Medicine

## 2022-04-28 ENCOUNTER — Other Ambulatory Visit (INDEPENDENT_AMBULATORY_CARE_PROVIDER_SITE_OTHER): Payer: Medicare Other

## 2022-04-28 DIAGNOSIS — E039 Hypothyroidism, unspecified: Secondary | ICD-10-CM | POA: Diagnosis not present

## 2022-04-28 LAB — CBC WITH DIFFERENTIAL/PLATELET
Basophils Absolute: 0 10*3/uL (ref 0.0–0.1)
Basophils Relative: 0.6 % (ref 0.0–3.0)
Eosinophils Absolute: 0.1 10*3/uL (ref 0.0–0.7)
Eosinophils Relative: 1.7 % (ref 0.0–5.0)
HCT: 34.1 % — ABNORMAL LOW (ref 36.0–46.0)
Hemoglobin: 11.5 g/dL — ABNORMAL LOW (ref 12.0–15.0)
Lymphocytes Relative: 35.3 % (ref 12.0–46.0)
Lymphs Abs: 2.5 10*3/uL (ref 0.7–4.0)
MCHC: 33.5 g/dL (ref 30.0–36.0)
MCV: 92.7 fl (ref 78.0–100.0)
Monocytes Absolute: 0.6 10*3/uL (ref 0.1–1.0)
Monocytes Relative: 7.9 % (ref 3.0–12.0)
Neutro Abs: 3.8 10*3/uL (ref 1.4–7.7)
Neutrophils Relative %: 54.5 % (ref 43.0–77.0)
Platelets: 227 10*3/uL (ref 150.0–400.0)
RBC: 3.68 Mil/uL — ABNORMAL LOW (ref 3.87–5.11)
RDW: 13.6 % (ref 11.5–15.5)
WBC: 7 10*3/uL (ref 4.0–10.5)

## 2022-04-28 LAB — TSH: TSH: 3.19 u[IU]/mL (ref 0.35–5.50)

## 2022-05-20 DIAGNOSIS — H524 Presbyopia: Secondary | ICD-10-CM | POA: Diagnosis not present

## 2022-05-20 DIAGNOSIS — E119 Type 2 diabetes mellitus without complications: Secondary | ICD-10-CM | POA: Diagnosis not present

## 2022-05-20 LAB — HM DIABETES EYE EXAM

## 2022-06-02 ENCOUNTER — Other Ambulatory Visit: Payer: Self-pay | Admitting: Family Medicine

## 2022-06-02 DIAGNOSIS — E1165 Type 2 diabetes mellitus with hyperglycemia: Secondary | ICD-10-CM

## 2022-07-09 ENCOUNTER — Other Ambulatory Visit: Payer: Self-pay | Admitting: Family Medicine

## 2022-07-28 ENCOUNTER — Ambulatory Visit (INDEPENDENT_AMBULATORY_CARE_PROVIDER_SITE_OTHER): Payer: Medicare Other

## 2022-07-28 ENCOUNTER — Telehealth: Payer: Self-pay

## 2022-07-28 DIAGNOSIS — Z0000 Encounter for general adult medical examination without abnormal findings: Secondary | ICD-10-CM

## 2022-07-28 NOTE — Progress Notes (Addendum)
Subjective:   [name] is a 77 y.o. female who presents for Medicare Annual (Subsequent) preventive examination.  I connected with  Cynthia Burns on 07/28/22 by a audio enabled telemedicine application and verified that I am speaking with the correct person using two identifiers.  Patient Location: Home  Provider Location: Office/Clinic  I discussed the limitations of evaluation and management by telemedicine. The patient expressed understanding and agreed to proceed.   Review of Systems     Cardiac Risk Factors include: advanced age (>53mn, >>63women);diabetes mellitus;hypertension;dyslipidemia     Objective:    There were no vitals filed for this visit. There is no height or weight on file to calculate BMI.     07/28/2022   11:12 AM 07/23/2021    3:07 PM 07/09/2020   11:04 AM 06/23/2019    8:15 AM 05/25/2018    1:35 PM 09/25/2016    9:11 AM  Advanced Directives  Does Patient Have a Medical Advance Directive? Yes Yes Yes Yes Yes Yes  Type of AParamedicof ACayceLiving will HPlattsmouthLiving will HMurtaughLiving will HWendellLiving will HLowellLiving will HAdvanceLiving will  Does patient want to make changes to medical advance directive? No - Patient declined  No - Patient declined No - Patient declined  No - Patient declined  Copy of HClover [place] Chart? No - copy requested No - copy requested No - copy requested No - copy requested No - copy requested No - copy requested    Current Medications (verified) Outpatient Encounter Medications as of 07/28/2022  Medication Sig   aspirin EC 81 MG tablet Take 1 tablet (81 mg total) by mouth daily.   Biotin 1000 MCG tablet Take 5,000 mcg by mouth daily.   Blood Glucose Monitoring Suppl (ONE TOUCH ULTRA 2) w/Device KIT CHECK BLOOD SUGAR TWICE DAILY.  DX CODE  E11.9   calcium carbonate  (OSCAL) 1500 (600 Ca) MG TABS tablet Take 600 mg of elemental calcium by mouth daily with breakfast. Pt takes 1 tablet 1 to 2 times a week due to side effects of bumps in mouth due to intolerance of coating on the tablets.   Cyanocobalamin (VITAMIN B-12 PO) Take 1 tablet by mouth daily.    fexofenadine (ALLEGRA) 180 MG tablet Take 180 mg by mouth daily.   furosemide (LASIX) 20 MG tablet Take 0.5 tablets (10 mg total) by mouth daily. Pt needs to keep upcoming appt in Dec for further refills   glucose blood (ONETOUCH ULTRA) test strip Check blood sugars twice daily   Lancets (ONETOUCH DELICA PLUS [id number] MISC USE 1 LANCET TO TEST AS DIRECTED (DISCARD LANCET IN SHARPS CONTAINER IMMEDIATELY AFTER USE)   levothyroxine (SYNTHROID) 25 MCG tablet TAKE 1 TABLET BY MOUTH EVERY DAY BEFORE BREAKFAST   losartan (COZAAR) 25 MG tablet Take 1 tablet (25 mg total) by mouth daily. Pt needs to keep upcoming appt in Dec for further refills   magnesium oxide (MAG-OX) 400 MG tablet Take 400 mg by mouth 3 (three) times a week.   metFORMIN (GLUCOPHAGE) 850 MG tablet TAKE ONE TABLET BY MOUTH THREE TIMES A DAY WITH FOOD   Multiple Vitamins-Minerals (PRESERVISION AREDS 2) CAPS Take by mouth.   nebivolol (BYSTOLIC) 5 MG tablet Take 1 tablet (5 mg total) by mouth daily. Pt needs to keep upcoming appt in Dec for further refills   rosuvastatin (CRESTOR) 20 MG  tablet Take 1 tablet (20 mg total) by mouth daily. Pt needs to keep upcoming appt in Dec for further refills   Semaglutide (RYBELSUS) 7 MG TABS Take 1 tablet by mouth daily.   VITAMIN D, CHOLECALCIFEROL, PO Take 25 mcg by mouth daily.   amoxicillin (AMOXIL) 500 MG capsule Take 500 mg by mouth. For dental procedures (Patient not taking: Reported on 07/28/2022)   No facility-administered encounter medications on file as of 07/28/2022.    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   HYPERTENSION    MITRAL VALVE PROLAPSE    OSTEOPENIA    Overweight(278.02)    PHARYNGITIS, ACUTE    Past Surgical History:  Procedure Laterality Date   ABDOMINAL HYSTERECTOMY     CHOLECYSTECTOMY     Family History  Problem Relation Age of Onset   Emphysema Father    Diabetes Father    Heart disease Mother        Had a stent   Hypertension Other    Colon cancer Neg Hx    BRCA 1/2 Neg Hx    Breast cancer Neg Hx    Social History   Socioeconomic History   Marital status: Married    Spouse name: Not on file   Number of children: Not on file   Years of education: Not on file   Highest education level: Not on file  Occupational History   Occupation: hairdresser  Tobacco Use   Smoking status: Never   Smokeless tobacco: Never  Vaping Use   Vaping Use: Never used  Substance and Sexual Activity   Alcohol use: No    Alcohol/week: 0.0 standard drinks of alcohol   Drug use: No   Sexual activity: Not Currently  Other Topics Concern   Not on file  Social History Narrative   Not on file   Social Determinants of Health   Financial Resource Strain: Low Risk  (07/23/2021)   Overall Financial Resource Strain (CARDIA)    Difficulty of Paying Living Expenses: Not hard at all  Food Insecurity: No Food Insecurity (07/23/2021)   Hunger Vital Sign    Worried About Running Out of Food in the Last Year: Never true    Ran Out of Food in the Last Year: Never true  Transportation Needs: No Transportation Needs (07/23/2021)   PRAPARE - Hydrologist (Medical): No    Lack of Transportation (Non-Medical): No  Physical Activity: Inactive (07/23/2021)   Exercise Vital Sign    Days of Exercise per Week: 0 days    Minutes of Exercise per Session: 0 min  Stress: No Stress Concern Present  (07/23/2021)   [place]    Feeling of Stress : Not at all  Social Connections: [place] (07/23/2021)   Social Connection and Isolation Panel [NHANES]    Frequency of Communication with Friends and Family: More than three times a week    Frequency of Social Gatherings with Friends and Family: More than three times a week    Attends Religious Services: More than 4 times per year    Active Member of [employer] or Organizations: Yes    Attends Archivist Meetings: More than 4 times per year    Marital  Status: Married    Tobacco Counseling Counseling given: Not Answered   Clinical Intake:  Pre-visit preparation completed: Yes  Pain : No/denies pain     Nutritional Risks: None Diabetes: Yes CBG done?: No Did pt. bring in CBG monitor from home?: No  How often do you need to have someone help you when you read instructions, pamphlets, or other written materials from your doctor or pharmacy?: 1 - Never  Diabetic?yes Nutrition Risk Assessment:  Has the patient had any N/V/D within the last 2 months?  Yes  Does the patient have any non-healing wounds?  No  Has the patient had any unintentional weight loss or weight gain?  No   Diabetes:  Is the patient diabetic?  Yes  If diabetic, was a CBG obtained today?  No  Did the patient bring in their glucometer from home?  No  How often do you monitor your CBG's? Once daily.   Financial Strains and Diabetes Management:  Are you having any financial strains with the device, your supplies or your medication? No .  Does the patient want to be seen by Chronic Care Management for management of their diabetes?  No  Would the patient like to be referred to a Nutritionist or for Diabetic Management?  No   Diabetic Exams:  Diabetic Eye Exam: Completed 05/20/22 Diabetic Foot Exam: Completed 02/06/22    Interpreter Needed?: No  Information entered by :: [place] of Daily Living    07/28/2022   11:16 AM  In your present state of health, do you have any difficulty performing the following activities:  Hearing? 0  Vision? 0  Difficulty concentrating or making decisions? 0  Walking or climbing stairs? 0  Dressing or bathing? 0  Doing errands, shopping? 0  Preparing Food and eating ? N  Using the Toilet? N  In the past six months, have you accidently leaked urine? N  Do you have problems with loss of bowel control? N  Managing your Medications? N  Managing your Finances? N  Housekeeping or managing your Housekeeping? N    Patient Care Team: Carollee Herter, Alferd Apa, DO as PCP - General Josue Hector, MD as PCP - Cardiology (Cardiology) Shon Hough, MD as Consulting Physician (Ophthalmology) Lafayette Dragon, MD (Inactive) (Gastroenterology) Mcarthur Rossetti, MD as Consulting Physician (Orthopedic Surgery) Druscilla Brownie, MD as Referring Physician (Dermatology)  Indicate any recent Medical Services you may have received from other than [facility] providers in the past year (date may be approximate).     Assessment:   This is a routine wellness examination for Cynthia Burns.  Hearing/Vision screen No results found.  Dietary issues and exercise activities discussed: Current Exercise Habits: The patient does not participate in regular exercise at present, Type of exercise: walking, Time (Minutes): 60, Frequency (Times/Week): 7, Weekly Exercise (Minutes/Week): 420, Intensity: Mild, Exercise limited by: None identified   Goals Addressed   None    Depression Screen    07/28/2022   11:13 AM 07/26/2021    8:07 AM 07/23/2021    3:14 PM 07/06/2020    8:39 AM 06/23/2019    8:17 AM 05/25/2018    1:35 PM 11/19/2017    9:31 AM  PHQ 2/9 Scores  PHQ - 2 Score 0 0 0 0 0 0 0    Fall Risk    07/28/2022   11:13 AM 07/23/2021    3:11 PM 07/09/2020   11:07 AM 07/06/2020    8:38 AM 06/23/2019  8:[place] in the past  year? 0 0 0 0 0  Number falls in past yr: 0 0 0 0   Injury with Fall? 0 0 0 0   Risk for fall due to : No Fall Risks      Follow up Falls evaluation completed Falls prevention discussed Education provided;Falls prevention discussed Falls evaluation completed     FALL RISK PREVENTION PERTAINING TO THE HOME:  Any stairs in or around the home? Yes  If so, are there any without handrails? No  Home free of loose throw rugs in walkways, pet beds, electrical cords, etc? Yes  Adequate lighting in your home to reduce risk of falls? Yes   ASSISTIVE DEVICES UTILIZED TO PREVENT FALLS:  Life alert? No  Use of a cane, walker or w/c? No  Grab bars in the bathroom? Yes  Shower chair or bench in shower? Yes  Elevated toilet seat or a handicapped toilet? Yes   TIMED UP AND GO:  Was the test performed? No .    Cognitive Function:    09/25/2016    9:12 AM  MMSE - Mini Mental State Exam  Orientation to time 5  Orientation to Place 5  Registration 3  Attention/ Calculation 5  Recall 3  Language- name 2 objects 2  Language- repeat 1  Language- follow 3 step command 3  Language- read & follow direction 1  Write a sentence 1  Copy design 1  Total score 30        07/28/2022   11:25 AM  6CIT Screen  What Year? 0 points  What month? 0 points  What time? 0 points  Count back from 20 0 points  Months in reverse 0 points  Repeat phrase 0 points  Total Score 0 points    Immunizations Immunization History  Administered Date(s) Administered   Fluad Quad(high Dose 65+) 08/18/2019   Influenza Whole 10/20/2007   Influenza, High Dose Seasonal PF 09/01/2016, 09/18/2018, 08/30/2020   Influenza,inj,Quad PF,6+ Mos 09/05/2013, 09/05/2014, 09/13/2015   Influenza-Unspecified 09/21/2017, 09/23/2021   PFIZER(Purple Top)SARS-COV-2 Vaccination 02/13/2020, 03/05/2020, 11/06/2020   Pneumococcal Conjugate-13 02/27/2014   Pneumococcal Polysaccharide-23 09/06/2011, 07/26/2019, 07/25/2020   Td 07/26/2019    Zoster Recombinat (Shingrix) 04/12/2017, 06/13/2017   Zoster, Live 09/14/2013    TDAP status: Up to date  Flu Vaccine status: Up to date  Pneumococcal vaccine status: Up to date  Covid-19 vaccine status: Declined, Education has been provided regarding the importance of this vaccine but patient still declined. Advised may receive this vaccine at local pharmacy or Health Dept.or vaccine clinic. Aware to provide a copy of the vaccination record if obtained from local pharmacy or Health Dept. Verbalized acceptance and understanding.  Qualifies for Shingles Vaccine? Yes   Zostavax completed No   Shingrix Completed?: No.    Education has been provided regarding the importance of this vaccine. Patient has been advised to call insurance company to determine out of pocket expense if they have not yet received this vaccine. Advised may also receive vaccine at local pharmacy or Health Dept. Verbalized acceptance and understanding.  Screening Tests Health Maintenance  Topic Date Due   COVID-19 Vaccine (4 - Pfizer series) 01/01/2021   INFLUENZA VACCINE  07/22/2022   HEMOGLOBIN A1C  08/06/2022   MAMMOGRAM  10/21/2022   FOOT EXAM  02/06/2023   OPHTHALMOLOGY EXAM  05/21/2023   COLONOSCOPY (Pts 45-37yr Insurance coverage will need to be confirmed)  11/09/2023   TETANUS/TDAP  07/25/2029   Pneumonia Vaccine 19+ Years old  Completed   DEXA SCAN  Completed   Hepatitis C Screening  Completed   Zoster Vaccines- Shingrix  Completed   HPV VACCINES  Aged Out    Health Maintenance  Health Maintenance Due  Topic Date Due   COVID-19 Vaccine (4 - Pfizer series) 01/01/2021   INFLUENZA VACCINE  07/22/2022    Colorectal cancer screening: Type of screening: Colonoscopy. Completed 11/08/13. Repeat every 10 years  Mammogram status: Completed 10/21/21. Repeat every year  Bone Density status: Completed 10/18/20. Results reflect: Bone density results: NORMAL. Repeat every 2 years.  Lung Cancer  Screening: (Low Dose CT Chest recommended if Age 46-80 years, 30 pack-year currently smoking OR have quit w/in 15years.) does not qualify.   Lung Cancer Screening Referral: N/a  Additional Screening:  Hepatitis C Screening: does qualify; Completed 09/13/15  Vision Screening: Recommended annual ophthalmology exams for early detection of glaucoma and other disorders of the eye. Is the patient up to date with their annual eye exam?  Yes  Who is the provider or what is the name of the office in which the patient attends annual eye exams? Dr. Valetta Close If pt is not established with a provider, would they like to be referred to a provider to establish care? No .   Dental Screening: Recommended annual dental exams for proper oral hygiene  Community Resource Referral / Chronic Care Management: CRR required this visit?  No   CCM required this visit?  No      Plan:     I have personally reviewed and noted the following in the patient's chart:   Medical and social history Use of alcohol, tobacco or illicit drugs  Current medications and supplements including opioid prescriptions.  Functional ability and status Nutritional status Physical activity Advanced directives List of other physicians Hospitalizations, surgeries, and ER visits in previous 12 months Vitals Screenings to include cognitive, depression, and falls Referrals and appointments  In addition, I have reviewed and discussed with patient certain preventive protocols, quality metrics, and best practice recommendations. A written personalized care plan for preventive services as well as general preventive health recommendations were provided to patient.   Due to this being a telephonic visit, the after visit summary with patients personalized plan was offered to patient via mail or my-chart.  Patient would like to access on my-chart.  Duard Brady Zelie Asbill, [place]   07/28/2022   Nurse Notes: none

## 2022-07-28 NOTE — Patient Instructions (Signed)
Cynthia Burns , Thank you for taking time to come for your Medicare Wellness Visit. I appreciate your ongoing commitment to your health goals. Please review the following plan we discussed and let me know if I can assist you in the future.   Screening recommendations/referrals: Colonoscopy: 11/08/13 due 11/09/23 Mammogram: 10/21/21 due 10/21/22 Bone Density: 10/18/20 due 10/18/22 Recommended yearly ophthalmology/optometry visit for glaucoma screening and checkup Recommended yearly dental visit for hygiene and checkup  Vaccinations: Influenza vaccine: up to date Pneumococcal vaccine: up to date Tdap vaccine: up to date Shingles vaccine: Due-May obtain vaccine at your local pharmacy.    Covid-19:declined  Advanced directives: yes, not on file  Conditions/risks identified: see problem list  Next appointment: Follow up in one year for your annual wellness visit 07/29/23   Preventive Care 75 Years and Older, Female Preventive care refers to lifestyle choices and visits with your health care provider that can promote health and wellness. What does preventive care include? A yearly physical exam. This is also called an annual well check. Dental exams once or twice a year. Routine eye exams. Ask your health care provider how often you should have your eyes checked. Personal lifestyle choices, including: Daily care of your teeth and gums. Regular physical activity. Eating a healthy diet. Avoiding tobacco and drug use. Limiting alcohol use. Practicing safe sex. Taking low-dose aspirin every day. Taking vitamin and mineral supplements as recommended by your health care provider. What happens during an annual well check? The services and screenings done by your health care provider during your annual well check will depend on your age, overall health, lifestyle risk factors, and family history of disease. Counseling  Your health care provider may ask you questions about your: Alcohol  use. Tobacco use. Drug use. Emotional well-being. Home and relationship well-being. Sexual activity. Eating habits. History of falls. Memory and ability to understand (cognition). Work and work Statistician. Reproductive health. Screening  You may have the following tests or measurements: Height, weight, and BMI. Blood pressure. Lipid and cholesterol levels. These may be checked every 5 years, or more frequently if you are over 65 years old. Skin check. Lung cancer screening. You may have this screening every year starting at age 75 if you have a 30-pack-year history of smoking and currently smoke or have quit within the past 15 years. Fecal occult blood test (FOBT) of the stool. You may have this test every year starting at age 75. Flexible sigmoidoscopy or colonoscopy. You may have a sigmoidoscopy every 5 years or a colonoscopy every 10 years starting at age 75. Hepatitis C blood test. Hepatitis B blood test. Sexually transmitted disease (STD) testing. Diabetes screening. This is done by checking your blood sugar (glucose) after you have not eaten for a while (fasting). You may have this done every 1-3 years. Bone density scan. This is done to screen for osteoporosis. You may have this done starting at age 75. Mammogram. This may be done every 1-2 years. Talk to your health care provider about how often you should have regular mammograms. Talk with your health care provider about your test results, treatment options, and if necessary, the need for more tests. Vaccines  Your health care provider may recommend certain vaccines, such as: Influenza vaccine. This is recommended every year. Tetanus, diphtheria, and acellular pertussis (Tdap, Td) vaccine. You may need a Td booster every 10 years. Zoster vaccine. You may need this after age 75. Pneumococcal 13-valent conjugate (PCV13) vaccine. One dose is recommended after age  65. Pneumococcal polysaccharide (PPSV23) vaccine. One dose is  recommended after age 70. Talk to your health care provider about which screenings and vaccines you need and how often you need them. This information is not intended to replace advice given to you by your health care provider. Make sure you discuss any questions you have with your health care provider. Document Released: 01/04/2016 Document Revised: 08/27/2016 Document Reviewed: 10/09/2015 Elsevier Interactive Patient Education  2017 [place] Prevention in the Home Falls can cause injuries. They can happen to people of all ages. There are many things you can do to make your home safe and to help prevent falls. What can I do on the outside of my home? Regularly fix the edges of walkways and driveways and fix any cracks. Remove anything that might make you trip as you walk through a door, such as a raised step or threshold. Trim any bushes or trees on the path to your home. Use bright outdoor lighting. Clear any walking paths of anything that might make someone trip, such as rocks or tools. Regularly check to see if handrails are loose or broken. Make sure that both sides of any steps have handrails. Any raised decks and porches should have guardrails on the edges. Have any leaves, snow, or ice cleared regularly. Use sand or salt on walking paths during winter. Clean up any spills in your garage right away. This includes oil or grease spills. What can I do in the bathroom? Use night lights. Install grab bars by the toilet and in the tub and shower. Do not use towel bars as grab bars. Use non-skid mats or decals in the tub or shower. If you need to sit down in the shower, use a plastic, non-slip stool. Keep the floor dry. Clean up any water that spills on the floor as soon as it happens. Remove soap buildup in the tub or shower regularly. Attach bath mats securely with double-sided non-slip rug tape. Do not have throw rugs and other things on the floor that can make you  trip. What can I do in the bedroom? Use night lights. Make sure that you have a light by your bed that is easy to reach. Do not use any sheets or blankets that are too big for your bed. They should not hang down onto the floor. Have a firm chair that has side arms. You can use this for support while you get dressed. Do not have throw rugs and other things on the floor that can make you trip. What can I do in the kitchen? Clean up any spills right away. Avoid walking on wet floors. Keep items that you use a lot in easy-to-reach places. If you need to reach something above you, use a strong step stool that has a grab bar. Keep electrical cords out of the way. Do not use floor polish or wax that makes floors slippery. If you must use wax, use non-skid floor wax. Do not have throw rugs and other things on the floor that can make you trip. What can I do with my stairs? Do not leave any items on the stairs. Make sure that there are handrails on both sides of the stairs and use them. Fix handrails that are broken or loose. Make sure that handrails are as long as the stairways. Check any carpeting to make sure that it is firmly attached to the stairs. Fix any carpet that is loose or worn. Avoid having throw rugs at  the top or bottom of the stairs. If you do have throw rugs, attach them to the floor with carpet tape. Make sure that you have a light switch at the top of the stairs and the bottom of the stairs. If you do not have them, ask someone to add them for you. What else can I do to help prevent falls? Wear shoes that: Do not have high heels. Have rubber bottoms. Are comfortable and fit you well. Are closed at the toe. Do not wear sandals. If you use a stepladder: Make sure that it is fully opened. Do not climb a closed stepladder. Make sure that both sides of the stepladder are locked into place. Ask someone to hold it for you, if possible. Clearly mark and make sure that you can  see: Any grab bars or handrails. First and last steps. Where the edge of each step is. Use tools that help you move around (mobility aids) if they are needed. These include: Canes. Walkers. Scooters. Crutches. Turn on the lights when you go into a dark area. Replace any light bulbs as soon as they burn out. Set up your furniture so you have a clear path. Avoid moving your furniture around. If any of your floors are uneven, fix them. If there are any pets around you, be aware of where they are. Review your medicines with your doctor. Some medicines can make you feel dizzy. This can increase your chance of falling. Ask your doctor what other things that you can do to help prevent falls. This information is not intended to replace advice given to you by your health care provider. Make sure you discuss any questions you have with your health care provider. Document Released: 10/04/2009 Document Revised: 05/15/2016 Document Reviewed: 01/12/2015 Elsevier Interactive Patient Education  2017 [employer].

## 2022-07-28 NOTE — Telephone Encounter (Signed)
Lvm that she should still be taking the thyroid medication.

## 2022-07-28 NOTE — Telephone Encounter (Signed)
Pt would like to know if she should still be taking the thyroid medication.

## 2022-08-01 ENCOUNTER — Other Ambulatory Visit: Payer: Self-pay

## 2022-08-01 ENCOUNTER — Emergency Department (HOSPITAL_BASED_OUTPATIENT_CLINIC_OR_DEPARTMENT_OTHER)
Admission: EM | Admit: 2022-08-01 | Discharge: 2022-08-01 | Disposition: A | Discharge status: Home / Self Care | Payer: Medicare Other | Attending: Emergency Medicine | Admitting: Emergency Medicine

## 2022-08-01 ENCOUNTER — Encounter (HOSPITAL_BASED_OUTPATIENT_CLINIC_OR_DEPARTMENT_OTHER): Payer: Self-pay | Admitting: Emergency Medicine

## 2022-08-01 DIAGNOSIS — Z7982 Long term (current) use of aspirin: Secondary | ICD-10-CM | POA: Insufficient documentation

## 2022-08-01 DIAGNOSIS — Z7984 Long term (current) use of oral hypoglycemic drugs: Secondary | ICD-10-CM | POA: Diagnosis not present

## 2022-08-01 DIAGNOSIS — I10 Essential (primary) hypertension: Secondary | ICD-10-CM | POA: Insufficient documentation

## 2022-08-01 DIAGNOSIS — E119 Type 2 diabetes mellitus without complications: Secondary | ICD-10-CM | POA: Diagnosis not present

## 2022-08-01 DIAGNOSIS — U071 COVID-19: Secondary | ICD-10-CM | POA: Insufficient documentation

## 2022-08-01 DIAGNOSIS — Z79899 Other long term (current) drug therapy: Secondary | ICD-10-CM | POA: Insufficient documentation

## 2022-08-01 DIAGNOSIS — R011 Cardiac murmur, unspecified: Secondary | ICD-10-CM | POA: Insufficient documentation

## 2022-08-01 DIAGNOSIS — R059 Cough, unspecified: Secondary | ICD-10-CM | POA: Diagnosis present

## 2022-08-01 LAB — RESP PANEL BY RT-PCR (FLU A&B, COVID) ARPGX2
Influenza A by PCR: NEGATIVE
Influenza B by PCR: NEGATIVE
SARS Coronavirus 2 by RT PCR: POSITIVE — AB

## 2022-08-01 MED ORDER — NIRMATRELVIR/RITONAVIR (PAXLOVID)TABLET: 3.0000 | ORAL_TABLET | Freq: Two times a day (BID) | ORAL | 0 refills | Status: AC

## 2022-08-01 NOTE — Discharge Instructions (Signed)
You were seen in the emergency department for [place].  He tested positive for COVID, please take Paxlovid twice daily for 5 days.  Return to the ED for coughing up blood, chest pain, shortness of breath or new and concerning symptoms.  You can also take Tylenol to help with the fever and body aches.

## 2022-08-01 NOTE — ED Notes (Signed)
Dc instructions and scripts reviewed with pt no questions or concerns at this time. Will follow up as needed ?

## 2022-08-01 NOTE — ED Provider Notes (Signed)
[facility] Provider Note   CSN: [id number] Arrival date & time: 08/01/22  2104     History  Chief Complaint  Patient presents with   Cough   Sore Throat    Cynthia Burns is a 76 y.o. female.   Cough Sore Throat     Patient with history of diabetes, hypertension, mitral valve prolapse presents today due to dry cough, sore throat x2 days.  She also having fevers at home, she has taken some Tylenol with relief.  Denies any chest pain or shortness of breath or mopped assist.  She called her doctor after taking a home COVID test which was positive and advised to go to ED for possible Paxlovid.  Home Medications Prior to Admission medications   Medication Sig Start Date End Date Taking? Authorizing Provider  nirmatrelvir/ritonavir EUA (PAXLOVID) 20 x 150 MG & 10 x 100MG TABS Take 3 tablets by mouth 2 (two) times daily for 5 days. Patient GFR is >60. Take nirmatrelvir (150 mg) two tablets twice daily for 5 days and ritonavir (100 mg) one tablet twice daily for 5 days. 08/01/22 08/06/22 Yes Zahara Rembert, Hildred Alamin, PA-C  amoxicillin (AMOXIL) 500 MG capsule Take 500 mg by mouth. For dental procedures Patient not taking: Reported on 07/28/2022    [provider]  aspirin EC 81 MG tablet Take 1 tablet (81 mg total) by mouth daily. 11/23/12   Josue Hector, MD  Biotin 1000 MCG tablet Take 5,000 mcg by mouth daily.    [provider]  Blood Glucose Monitoring Suppl (ONE TOUCH ULTRA 2) w/Device KIT CHECK BLOOD SUGAR TWICE DAILY.  DX CODE  E11.9 08/23/19   Carollee Herter, Alferd Apa, DO  calcium carbonate (OSCAL) 1500 (600 Ca) MG TABS tablet Take 600 mg of elemental calcium by mouth daily with breakfast. Pt takes 1 tablet 1 to 2 times a week due to side effects of bumps in mouth due to intolerance of coating on the tablets.    [provider]  Cyanocobalamin (VITAMIN B-12 PO) Take 1 tablet by mouth daily.     [provider]  fexofenadine  (ALLEGRA) 180 MG tablet Take 180 mg by mouth daily.    [provider]  furosemide (LASIX) 20 MG tablet Take 0.5 tablets (10 mg total) by mouth daily. Pt needs to keep upcoming appt in Dec for further refills 01/28/22   Josue Hector, MD  glucose blood [facility] ULTRA) test strip Check blood sugars twice daily 11/20/21   Carollee Herter, [place] R, DO  Lancets (ONETOUCH DELICA PLUS [id number]) MISC USE 1 LANCET TO TEST AS DIRECTED (DISCARD LANCET IN SHARPS CONTAINER IMMEDIATELY AFTER USE) 06/26/21   Carollee Herter, Alferd Apa, DO  levothyroxine (SYNTHROID) 25 MCG tablet TAKE 1 TABLET BY MOUTH EVERY DAY BEFORE BREAKFAST 07/09/22   Carollee Herter, Alferd Apa, DO  losartan (COZAAR) 25 MG tablet Take 1 tablet (25 mg total) by mouth daily. Pt needs to keep upcoming appt in Dec for further refills 01/28/22   Josue Hector, MD  magnesium oxide (MAG-OX) 400 MG tablet Take 400 mg by mouth 3 (three) times a week.    [provider]  metFORMIN (GLUCOPHAGE) 850 MG tablet TAKE ONE TABLET BY MOUTH THREE TIMES A DAY WITH FOOD 06/02/22   Carollee Herter, Alferd Apa, DO  Multiple Vitamins-Minerals (PRESERVISION AREDS 2) CAPS Take by mouth.    [provider]  nebivolol (BYSTOLIC) 5 MG tablet Take 1 tablet (5 mg total) by  mouth daily. Pt needs to keep upcoming appt in Dec for further refills 01/28/22   Josue Hector, MD  rosuvastatin (CRESTOR) 20 MG tablet Take 1 tablet (20 mg total) by mouth daily. Pt needs to keep upcoming appt in Dec for further refills 01/28/22   Josue Hector, MD  Semaglutide (RYBELSUS) 7 MG TABS Take 1 tablet by mouth daily. 02/06/22   Roma Schanz R, DO  VITAMIN D, CHOLECALCIFEROL, PO Take 25 mcg by mouth daily.    [provider]      Allergies    Mucinex [guaifenesin er], Codeine, Advicor [niacin-lovastatin er], Amlodipine, Lisinopril-hydrochlorothiazide, Other, Ramipril, and Sulfonamide derivatives    Review of Systems   Review of Systems  Respiratory:  Positive for  cough.     Physical Exam Updated Vital Signs BP (!) 168/74 (BP Location: Right Arm)   Pulse 86   Temp 99.4 F (37.4 C) (Oral)   Resp 18   Ht '5\' 2"'  (1.575 m)   Wt 52.6 kg   SpO2 96%   BMI 21.22 kg/m  Physical Exam Vitals and nursing note reviewed. Exam conducted with a chaperone present.  Constitutional:      Appearance: Normal appearance.  HENT:     Head: Normocephalic and atraumatic.     Mouth/Throat:     Pharynx: Posterior oropharyngeal erythema present.  Eyes:     General: No scleral icterus.       Right eye: No discharge.        Left eye: No discharge.     Extraocular Movements: Extraocular movements intact.     Pupils: Pupils are equal, round, and reactive to light.  Cardiovascular:     Rate and Rhythm: Normal rate and regular rhythm.     Pulses: Normal pulses.     Heart sounds: Murmur heard.     No friction rub. No gallop.  Pulmonary:     Effort: Pulmonary effort is normal. No respiratory distress.     Breath sounds: Normal breath sounds.  Abdominal:     General: Abdomen is flat. Bowel sounds are normal. There is no distension.     Palpations: Abdomen is soft.     Tenderness: There is no abdominal tenderness.  Skin:    General: Skin is warm and dry.     Coloration: Skin is not jaundiced.  Neurological:     Mental Status: She is alert. Mental status is at baseline.     Coordination: Coordination normal.     ED Results / Procedures / Treatments   Labs (all labs ordered are listed, but only abnormal results are displayed) Labs Reviewed  RESP PANEL BY RT-PCR (FLU A&B, COVID) ARPGX2 - Abnormal; Notable for the following components:      Result Value   SARS Coronavirus 2 by RT PCR POSITIVE (*)    All other components within normal limits    EKG None  Radiology No results found.  Procedures Procedures    Medications Ordered in ED Medications - No data to display  ED Course/ Medical Decision Making/ A&P                           Medical  Decision Making  Patient presents due to cough and sore throat and positive home COVID test.  Differential includes but not limited to pneumonia, COVID, respiratory failure.  On exam patient lungs are clear to auscultation bilaterally, she is an elevated temperature but is not febrile.  Not hypoxic and in no distress.  I ordered COVID test which was positive.  I reviewed external records including CMP from [facility] 16th 2023, patient's creatinine at that time was normal.  I considered repeating BMP but given its been taken in the last 6 months I do not think repeat is indicated.  We will prescribe Paxlovid given COVID test is positive.  Return precautions were discussed with the patient, discharged stable condition.        Final Clinical Impression(s) / ED Diagnoses Final diagnoses:  [id number]    Rx / DC Orders ED Discharge Orders          Ordered    nirmatrelvir/ritonavir EUA (PAXLOVID) 20 x 150 MG & 10 x 100MG TABS  2 times daily        08/01/22 2208              Sherrill Raring, PA-C 08/01/22 2214    Drenda Freeze, MD 08/01/22 2241

## 2022-08-01 NOTE — ED Triage Notes (Signed)
Covid +, Sore throat, cough. X 2 days.

## 2022-08-04 ENCOUNTER — Telehealth: Payer: Self-pay

## 2022-08-04 NOTE — Telephone Encounter (Signed)
Patient evaluated in ER.    [facility] Night - Client TELEPHONE ADVICE RECORD AccessNurse Patient Name: Rianne Vandeven Initial Comment Caller states her mother has COVID. She is having a hard time talking. She just vomited. She has diabetes. Translation No Nurse Assessment Nurse: Stephanie Coup, RN, Karlene Einstein Date/Time Eilene Ghazi Time): 08/01/2022 7:58:19 PM Confirm and document reason for call. If symptomatic, describe symptoms. ---Caller states her mother has COVID. She is having a hard time talking. She just vomited. She has diabetes. Does the patient have any new or worsening symptoms? ---Yes Will a triage be completed? ---Yes Related visit to physician within the last 2 weeks? ---No Does the PT have any chronic conditions? (i.e. diabetes, asthma, this includes High risk factors for pregnancy, etc.) ---Yes List chronic conditions. ---diabetes Is this a behavioral health or substance abuse call? ---No Guidelines Guideline Title Affirmed Question Affirmed Notes Nurse Date/Time (Eastern Time) COVID-19 - Diagnosed or Suspected [1] HIGH RISK patient (e.g., weak immune system, age > 93 years, obesity with BMI 30 or higher, pregnant, chronic lung disease or other chronic medical condition) AND [2] [place], RN, Karlene Einstein 08/01/2022 8:00:34 PM PLEASE NOTE: All timestamps contained within this report are represented as [place] Standard Time. CONFIDENTIALTY NOTICE: This fax transmission is intended only for the addressee. It contains information that is legally privileged, confidential or otherwise protected from use or disclosure. If you are not the intended recipient, you are strictly prohibited from reviewing, disclosing, copying using or disseminating any of this information or taking any action in reliance on or regarding this information. If you have received this fax in error, please notify us immediately by telephone so that we can arrange for its return to [place]. Phone:  [phone], Toll-Free: [phone], Fax: [phone] Page: 2 of 2 Call Id: [id number] Guidelines Guideline Title Affirmed Question Affirmed Notes Nurse Date/Time Eilene Ghazi Time) symptoms (e.g., cough, fever) (Exceptions: Already seen by PCP and no new or worsening symptoms.) Disp. Time Eilene Ghazi Time) Disposition Final User 08/01/2022 8:06:16 PM Call PCP within 24 Hours Yes Jeni Salles 08/01/2022 8:08:13 PM Paged On Call back to Call [place], [place], [place] Final Disposition 08/01/2022 8:06:16 PM Call PCP within 24 Hours Yes Stephanie Coup, RN, Karlene Einstein Caller Disagree/Comply Comply Caller Understands Yes PreDisposition Call Doctor Care Advice Given Per Guideline CALL PCP WITHIN 24 HOURS: * IF OFFICE WILL BE OPEN: Call the office when it opens tomorrow morning. CALL BACK IF: * You become worse CARE ADVICE given per COVID-19 - DIAGNOSED OR SUSPECTED (Adult) guideline. Comments User: Ilda Mori, RN Date/Time Eilene Ghazi Time): 08/01/2022 8:06:00 PM [facility] User: Patrina Levering, RN Date/Time Eilene Ghazi Time): 08/01/2022 8:31:57 PM Pt has had s/s for 48 hours. Notified MD recommends she go to ER. Verb understanding. [facility] the [facility] ER. Paging DoctorName Phone DateTime Result/ Outcome Message Type Notes Penni Homans - MD [phone] 08/01/2022 8:08:13 PM Paged On Call Back to Call Center Doctor Paged please call the nurse [phone] Penni Homans - MD 08/01/2022 8:30:33 PM Spoke with On Call - Physician Upgrade Message Result Recommends pt go to ED for eval

## 2022-08-07 ENCOUNTER — Ambulatory Visit: Payer: Medicare Other | Admitting: Family Medicine

## 2022-08-22 ENCOUNTER — Encounter: Payer: Self-pay | Admitting: Family Medicine

## 2022-08-22 ENCOUNTER — Ambulatory Visit (INDEPENDENT_AMBULATORY_CARE_PROVIDER_SITE_OTHER): Payer: Medicare Other | Admitting: Family Medicine

## 2022-08-22 VITALS — BP 110/80 | HR 69 | Temp 97.9°F | Resp 18 | Ht 62.0 in | Wt 115.6 lb

## 2022-08-22 DIAGNOSIS — E1165 Type 2 diabetes mellitus with hyperglycemia: Secondary | ICD-10-CM

## 2022-08-22 DIAGNOSIS — I10 Essential (primary) hypertension: Secondary | ICD-10-CM

## 2022-08-22 DIAGNOSIS — E039 Hypothyroidism, unspecified: Secondary | ICD-10-CM

## 2022-08-22 DIAGNOSIS — E1169 Type 2 diabetes mellitus with other specified complication: Secondary | ICD-10-CM

## 2022-08-22 DIAGNOSIS — E785 Hyperlipidemia, unspecified: Secondary | ICD-10-CM

## 2022-08-22 DIAGNOSIS — D509 Iron deficiency anemia, unspecified: Secondary | ICD-10-CM | POA: Diagnosis not present

## 2022-08-22 LAB — CBC WITH DIFFERENTIAL/PLATELET
Basophils Absolute: 0 10*3/uL (ref 0.0–0.1)
Basophils Relative: 0.5 % (ref 0.0–3.0)
Eosinophils Absolute: 0.1 10*3/uL (ref 0.0–0.7)
Eosinophils Relative: 1.2 % (ref 0.0–5.0)
HCT: 34.8 % — ABNORMAL LOW (ref 36.0–46.0)
Hemoglobin: 11.8 g/dL — ABNORMAL LOW (ref 12.0–15.0)
Lymphocytes Relative: 25.5 % (ref 12.0–46.0)
Lymphs Abs: 1.9 10*3/uL (ref 0.7–4.0)
MCHC: 33.8 g/dL (ref 30.0–36.0)
MCV: 93 fl (ref 78.0–100.0)
Monocytes Absolute: 0.5 10*3/uL (ref 0.1–1.0)
Monocytes Relative: 6.9 % (ref 3.0–12.0)
Neutro Abs: 4.9 10*3/uL (ref 1.4–7.7)
Neutrophils Relative %: 65.9 % (ref 43.0–77.0)
Platelets: 263 10*3/uL (ref 150.0–400.0)
RBC: 3.75 Mil/uL — ABNORMAL LOW (ref 3.87–5.11)
RDW: 13.9 % (ref 11.5–15.5)
WBC: 7.4 10*3/uL (ref 4.0–10.5)

## 2022-08-22 LAB — IBC + FERRITIN
Ferritin: 15.6 ng/mL (ref 10.0–291.0)
Iron: 54 ug/dL (ref 42–145)
Saturation Ratios: 17.9 % — ABNORMAL LOW (ref 20.0–50.0)
TIBC: 301 ug/dL (ref 250.0–450.0)
Transferrin: 215 mg/dL (ref 212.0–360.0)

## 2022-08-22 LAB — LIPID PANEL
Cholesterol: 105 mg/dL (ref 0–200)
HDL: 46.9 mg/dL (ref >39.00)
LDL Cholesterol: 41 mg/dL (ref 0–99)
NonHDL: 57.7
Total CHOL/HDL Ratio: 2
Triglycerides: 85 mg/dL (ref 0.0–149.0)
VLDL: 17 mg/dL (ref 0.0–40.0)

## 2022-08-22 LAB — COMPREHENSIVE METABOLIC PANEL
ALT: 15 U/L (ref 0–35)
AST: 17 U/L (ref 0–37)
Albumin: 3.9 g/dL (ref 3.5–5.2)
Alkaline Phosphatase: 28 U/L — ABNORMAL LOW (ref 39–117)
BUN: 12 mg/dL (ref 6–23)
CO2: 28 mEq/L (ref 19–32)
Calcium: 9.2 mg/dL (ref 8.4–10.5)
Chloride: 103 mEq/L (ref 96–112)
Creatinine, Ser: 0.84 mg/dL (ref 0.40–1.20)
GFR: 68.33 mL/min (ref >60.00)
Glucose, Bld: 85 mg/dL (ref 70–99)
Potassium: 4.2 mEq/L (ref 3.5–5.1)
Sodium: 138 mEq/L (ref 135–145)
Total Bilirubin: 0.4 mg/dL (ref 0.2–1.2)
Total Protein: 8 g/dL (ref 6.0–8.3)

## 2022-08-22 LAB — TSH: TSH: 1.13 u[IU]/mL (ref 0.35–5.50)

## 2022-08-22 LAB — HEMOGLOBIN A1C: Hgb A1c MFr Bld: 6.2 % (ref 4.6–6.5)

## 2022-08-22 MED ORDER — LEVOTHYROXINE SODIUM 25 MCG PO TABS: ORAL_TABLET | ORAL | 1 refills | Status: DC

## 2022-08-22 MED ORDER — RYBELSUS 7 MG PO TABS
1.0000 | ORAL_TABLET | Freq: Every day | ORAL | 1 refills | Status: DC
Start: 2022-08-22 — End: 2023-02-23

## 2022-08-22 MED ORDER — ONETOUCH ULTRA VI STRP: ORAL_STRIP | 12 refills | Status: DC

## 2022-08-22 MED ORDER — METFORMIN HCL 850 MG PO TABS: ORAL_TABLET | ORAL | 1 refills | Status: DC

## 2022-08-22 NOTE — Patient Instructions (Signed)

## 2022-08-22 NOTE — Assessment & Plan Note (Signed)
hgba1c to be checked, minimize simple carbs. Increase exercise as tolerated. Continue current meds  

## 2022-08-22 NOTE — Assessment & Plan Note (Signed)
Well controlled, no changes to meds. Encouraged heart healthy diet such as the DASH diet and exercise as tolerated.  °

## 2022-08-22 NOTE — Assessment & Plan Note (Signed)
Encourage heart healthy diet such as MIND or DASH diet, increase exercise, avoid trans fats, simple carbohydrates and processed foods, consider a krill or fish or flaxseed oil cap daily.  °

## 2022-08-22 NOTE — Progress Notes (Signed)
Established Patient Office Visit  Subjective   [name], female    DOB: 05-03-1946  Age: 76 y.o. MRN: [id number]  Chief Complaint  Patient presents with   Hyperlipidemia   Diabetes   Hypertension   Follow-up    HPI Pt here for f/u dm, chol and bp.  She had covid aug 9.  She went to ER and was given paxlovid.  She started with NVD and wt got down to 112 lbs.  She had paxlovid added to allergies.    HYPERTENSION  Blood pressure range-not checkiing   Chest pain- no      Dyspnea- no Lightheadedness- no   Edema- no Other side effects - no   Medication compliance: good Low salt diet- yes   DIABETES  Blood Sugar ranges-running a little higher since she moved rybelsus to lunch  Polyuria- no New Visual problems- no Hypoglycemic symptoms- no Other side effects-no Medication compliance - good Last eye exam- done Foot exam- 01/2022  HYPERLIPIDEMIA  Medication compliance- good RUQ pain- no  Muscle aches- no Other side effects-no     Patient Active Problem List   Diagnosis Date Noted   Type 2 diabetes mellitus with hyperglycemia, without long-term current use of insulin ([place]) 07/26/2021   Impacted cerumen of right ear 07/06/2020   DM (diabetes mellitus) type II uncontrolled, periph vascular disorder 04/11/2019   Hyperlipidemia LDL goal <70 03/23/2017   Abdominal pain, epigastric 09/03/2016   Strain of right biceps 03/17/2016   Right shoulder pain 03/17/2016   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   Plantar wart of right foot 03/15/2015   Type 2 diabetes mellitus not at goal [facility]) 09/16/2013   Elevated lipids 09/05/2013   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   OVERWEIGHT 02/07/2009   PHARYNGITIS, ACUTE 06/03/2007   Essential hypertension 05/18/2007   MITRAL VALVE PROLAPSE 05/18/2007   OSTEOPENIA 05/18/2007   Past Medical History:  Diagnosis Date   Diabetes mellitus without complication ([place])    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   HYPERTENSION     MITRAL VALVE PROLAPSE    OSTEOPENIA    Overweight(278.02)    PHARYNGITIS, ACUTE    Past Surgical History:  Procedure Laterality Date   ABDOMINAL HYSTERECTOMY     CHOLECYSTECTOMY     Social History   Tobacco Use   Smoking status: Never   Smokeless tobacco: Never  Vaping Use   Vaping Use: Never used  Substance Use Topics   Alcohol use: No    Alcohol/week: 0.0 standard drinks of alcohol   Drug use: No   Social History   Socioeconomic History   Marital status: Married    Spouse name: Not on file   Number of children: Not on file   Years of education: Not on file   Highest education level: Not on file  Occupational History   Occupation: hairdresser  Tobacco Use   Smoking status: Never   Smokeless tobacco: Never  Vaping Use   Vaping Use: Never used  Substance and Sexual Activity   Alcohol use: No    Alcohol/week: 0.0 standard drinks of alcohol   Drug use: No   Sexual activity: Not Currently  Other Topics Concern   Not on file  Social History Narrative   Not on file   Social Determinants of Health   Financial Resource Strain: Low Risk  (07/23/2021)   Overall Financial Resource Strain (CARDIA)    Difficulty of Paying Living Expenses: Not hard at all  Food Insecurity: No Food Insecurity (07/23/2021)   Hunger Vital Sign    Worried About Running Out of Food in the Last Year: Never true    Ran Out of Food in the Last Year: Never true  Transportation Needs: No Transportation Needs (07/23/2021)   PRAPARE - Hydrologist (Medical): No    Lack of Transportation (Non-Medical): No  Physical Activity: Inactive (07/23/2021)   Exercise Vital Sign    Days of Exercise per Week: 0 days    Minutes of Exercise per Session: 0 min  Stress: No Stress Concern Present (07/23/2021)   [place]    Feeling of Stress : Not at all  Social Connections: Socially Integrated (07/23/2021)   Social Connection  and Isolation Panel [NHANES]    Frequency of Communication with Friends and Family: More than three times a week    Frequency of Social Gatherings with Friends and Family: More than three times a week    Attends Religious Services: More than 4 times per year    Active Member of [employer] or Organizations: Yes    Attends Music therapist: More than 4 times per year    Marital Status: Married  Human resources officer Violence: Not At Risk (07/23/2021)   Humiliation, Afraid, Rape, and Kick questionnaire    Fear of Current or Ex-Partner: No    Emotionally Abused: No    Physically Abused: No    Sexually Abused: No   Family Status  Relation Name Status   Father  Deceased   Mother  Deceased   Other  (Not Specified)   MGM  Deceased   MGF  Deceased   PGM  Deceased   PGF  Deceased   Neg Hx  (Not Specified)   Family History  Problem Relation Age of Onset   Emphysema Father    Diabetes Father    Heart disease Mother        Had a stent   Hypertension Other    Colon cancer Neg Hx    BRCA 1/2 Neg Hx    Breast cancer Neg Hx    Allergies  Allergen Reactions   Mucinex [Guaifenesin Er] Anaphylaxis   Codeine Nausea And Vomiting   Paxlovid [Nirmatrelvir-Ritonavir] Diarrhea and Nausea And Vomiting   Advicor [Niacin-Lovastatin Er] Rash   Amlodipine Rash   Lisinopril-Hydrochlorothiazide Rash   Other Rash    Eye drops with preservatives.     Ramipril Rash   Sulfonamide Derivatives Rash      ROS    Objective:     BP 110/80 (BP Location: Left Arm, Patient Position: Sitting, Cuff Size: Normal)   Pulse 69   Temp 97.9 F (36.6 C) (Oral)   Resp 18   Ht _0  (1.575 m)   Wt 115 lb 9.6 oz (52.4 kg)   SpO2 98%   BMI 21.14 kg/m  BP Readings from Last 3 Encounters:  08/22/22 110/80  08/01/22 (!) 168/74  02/06/22 120/86   Wt Readings from Last 3 Encounters:  08/22/22 115 lb 9.6 oz (52.4 kg)  08/01/22 116 lb (52.6 kg)  02/06/22 121 lb 3.2 oz (55 kg)   SpO2 Readings from Last 3  Encounters:  08/22/22 98%  08/01/22 96%  02/06/22 98%      Physical Exam Vitals and nursing note reviewed.  Constitutional:      General: She is not in acute distress.    Appearance: She is well-developed. She is  not diaphoretic.  HENT:     Head: Normocephalic and atraumatic.     Right Ear: External ear normal.     Left Ear: External ear normal.     Nose: Nose normal.  Eyes:     General:        Right eye: No discharge.        Left eye: No discharge.     Conjunctiva/sclera: Conjunctivae normal.     Pupils: Pupils are equal, round, and reactive to light.  Neck:     Thyroid: No thyromegaly.     Vascular: No JVD.  Cardiovascular:     Rate and Rhythm: Normal rate and regular rhythm.     Heart sounds: Normal heart sounds. No murmur heard. Pulmonary:     Effort: Pulmonary effort is normal. No respiratory distress.     Breath sounds: Normal breath sounds. No wheezing or rales.  Chest:     Chest wall: No tenderness.  Abdominal:     General: Bowel sounds are normal. There is no distension.     Palpations: Abdomen is soft. There is no mass.     Tenderness: There is no abdominal tenderness. There is no guarding or rebound.  Genitourinary:    Vagina: Normal.  Musculoskeletal:        General: No tenderness. Normal range of motion.     Cervical back: Normal range of motion and neck supple.  Lymphadenopathy:     Cervical: No cervical adenopathy.  Skin:    General: Skin is warm and dry.     Findings: No erythema or rash.  Neurological:     Mental Status: She is alert and oriented to person, place, and time.     Cranial Nerves: No cranial nerve deficit.     Deep Tendon Reflexes: Reflexes are normal and symmetric.  Psychiatric:        Behavior: Behavior normal.        Thought Content: Thought content normal.        Judgment: Judgment normal.      No results found for any visits on 08/22/22.  Last CBC Lab Results  Component Value Date   WBC 7.0 04/28/2022   HGB 11.5 (L)  04/28/2022   HCT 34.1 (L) 04/28/2022   MCV 92.7 04/28/2022   RDW 13.6 04/28/2022   PLT 227.0 55/73/2202   Last metabolic panel Lab Results  Component Value Date   GLUCOSE 91 02/06/2022   NA 138 02/06/2022   K 4.0 02/06/2022   CL 102 02/06/2022   CO2 33 (H) 02/06/2022   BUN 16 02/06/2022   CREATININE 0.78 02/06/2022   CALCIUM 9.6 02/06/2022   PROT 7.6 02/06/2022   ALBUMIN 4.4 02/06/2022   BILITOT 0.5 02/06/2022   ALKPHOS 36 (L) 02/06/2022   AST 20 02/06/2022   ALT 17 02/06/2022   Last lipids Lab Results  Component Value Date   CHOL 102 02/06/2022   HDL 43.10 02/06/2022   LDLCALC 39 02/06/2022   LDLDIRECT 53.0 05/25/2018   TRIG 101.0 02/06/2022   CHOLHDL 2 02/06/2022   Last hemoglobin A1c Lab Results  Component Value Date   HGBA1C 6.2 02/06/2022   Last thyroid functions Lab Results  Component Value Date   TSH 3.19 04/28/2022   T4TOTAL 6.6 02/24/2022   Last vitamin D Lab Results  Component Value Date   VD25OH 52.56 02/06/2022   Last vitamin B12 and Folate No results found for: "VITAMINB12", "FOLATE"    The ASCVD Risk score (Arnett DK,  et al., 2019) failed to calculate for the following reasons:   The valid total cholesterol range is 130 to 320 mg/dL    Assessment & Plan:   Problem List Items Addressed This Visit       Unprioritized   Type 2 diabetes mellitus with hyperglycemia, without long-term current use of insulin ([place])    hgba1c to be checked , minimize simple carbs. Increase exercise as tolerated. Continue current meds       Relevant Medications   metFORMIN (GLUCOPHAGE) 850 MG tablet   Semaglutide (RYBELSUS) 7 MG TABS   Hyperlipidemia LDL goal <70    Encourage heart healthy diet such as MIND or DASH diet, increase exercise, avoid trans fats, simple carbohydrates and processed foods, consider a krill or fish or flaxseed oil cap daily.       Essential hypertension    Well controlled, no changes to meds. Encouraged heart healthy diet such  as the DASH diet and exercise as tolerated.       Relevant Orders   CBC with Differential/Platelet   Comprehensive metabolic panel   Lipid panel   TSH   Hemoglobin A1c   Other Visit Diagnoses     Hypothyroidism, unspecified type    -  Primary   Relevant Medications   levothyroxine (SYNTHROID) 25 MCG tablet   Other Relevant Orders   TSH   Uncontrolled type 2 diabetes mellitus with hyperglycemia (HCC)       Relevant Medications   metFORMIN (GLUCOPHAGE) 850 MG tablet   Semaglutide (RYBELSUS) 7 MG TABS   glucose blood (ONETOUCH ULTRA) test strip   Other Relevant Orders   Comprehensive metabolic panel   Hemoglobin A1c   Hyperlipidemia associated with type 2 diabetes mellitus (HCC)       Relevant Medications   metFORMIN (GLUCOPHAGE) 850 MG tablet   Semaglutide (RYBELSUS) 7 MG TABS   Other Relevant Orders   CBC with Differential/Platelet   Comprehensive metabolic panel   Lipid panel   TSH   Hemoglobin A1c   Iron deficiency anemia, unspecified iron deficiency anemia type       Relevant Orders   IBC + Ferritin       Return in about 6 months (around 02/20/2023), or if symptoms worsen or fail to improve, for hypertension, diabetes II, hyperlipidemia.    Ann Held, DO

## 2022-09-09 ENCOUNTER — Other Ambulatory Visit: Payer: Self-pay | Admitting: Family Medicine

## 2022-09-09 DIAGNOSIS — Z1231 Encounter for screening mammogram for malignant neoplasm of breast: Secondary | ICD-10-CM

## 2022-09-25 DIAGNOSIS — Z23 Encounter for immunization: Secondary | ICD-10-CM | POA: Diagnosis not present

## 2022-10-04 ENCOUNTER — Other Ambulatory Visit: Payer: Self-pay | Admitting: Family Medicine

## 2022-10-04 DIAGNOSIS — E039 Hypothyroidism, unspecified: Secondary | ICD-10-CM

## 2022-10-09 ENCOUNTER — Telehealth: Payer: Self-pay | Admitting: Family Medicine

## 2022-10-09 NOTE — Telephone Encounter (Signed)
Patient called to advise that some point after she had an allergic reaction from the Paxlovid, she was applying monistat and noticed a cyst or part of her vagina had dropped- she's not sure. She said she does not have pain or anything. She would like to have a referral to an OBGYN (female provider preferred), she is okay with the one across the hall or [facility] on [address]. Advised patient that she may need to come in for an OV before the referral. Please call patient to advise.

## 2022-10-10 ENCOUNTER — Other Ambulatory Visit: Payer: Self-pay

## 2022-10-10 DIAGNOSIS — N898 Other specified noninflammatory disorders of vagina: Secondary | ICD-10-CM

## 2022-10-10 NOTE — Telephone Encounter (Signed)
Referral placed.

## 2022-10-13 NOTE — Telephone Encounter (Signed)
Pt states she does not think gyno group takes her ins. She would like to stay in [place]/ [place] area. Please call pt once new referral is placed.

## 2022-10-17 ENCOUNTER — Encounter: Payer: Self-pay | Admitting: Obstetrics & Gynecology

## 2022-10-17 ENCOUNTER — Ambulatory Visit (INDEPENDENT_AMBULATORY_CARE_PROVIDER_SITE_OTHER): Payer: Medicare Other | Admitting: Obstetrics & Gynecology

## 2022-10-17 VITALS — BP 124/82 | HR 73

## 2022-10-17 DIAGNOSIS — N8110 Cystocele, unspecified: Secondary | ICD-10-CM

## 2022-10-17 DIAGNOSIS — Z90710 Acquired absence of both cervix and uterus: Secondary | ICD-10-CM

## 2022-10-17 NOTE — Progress Notes (Signed)
    Cynthia Burns 1946-03-08 [id number]        76 y.o.  G1P1001   RP: Vaginal bulge and vulvar burning  HPI: Patient recently felt a round bulge at the vagina/vulva when gets up in the morning.  No pain. Not feeling it the rest of the day.  S/P Total Hysterectomy with conservation of Ovaries at age 76.  Abstinent.  Urine normal.  No Urinary incontinence.  BMs normal.     OB History  Gravida Para Term Preterm AB Living  '1 1 1     1  '$ SAB IAB Ectopic Multiple Live Births               # Outcome Date GA Lbr Len/2nd Weight Sex Delivery Anes PTL Lv  1 Term             Past medical history,surgical history, problem list, medications, allergies, family history and social history were all reviewed and documented in the EPIC chart.   Directed ROS with pertinent positives and negatives documented in the history of present illness/assessment and plan.  Exam:  Vitals:   10/17/22 0826  BP: 124/82  Pulse: 73  SpO2: 99%   General appearance:  Normal  Gynecologic exam: Vulva normal.  No erythema, no lesion.  Bimanual exam:  Uterus/cervix absent.  No pelvic mass felt.   Standing position with Valsalva:  Cystocele grade 3/4.  No Colpocele, no Rectocele.   Assessment/Plan:  76 y.o. G1P1001   1. Baden-Walker grade 3 cystocele Patient recently felt a round bulge at the vagina/vulva when gets up in the morning.  No pain. Not feeling it the rest of the day.  S/P Total Hysterectomy with conservation of Ovaries at age 76.  Abstinent.  Urine normal.  No Urinary incontinence.  BMs normal.  Pelvic exam in standing position with Valsalva revealed a grade 3/4 Cystocele.  Patient is only mildly symptomatic from it.  Counseling and management thoroughly reviewed with patient.  Recommend to empty her bladder regularly before it is too full, avoid pelvic floor pressure/pushing and lifting heavy weights.  Recommend Kegel exercises, instructions given on how to perform them.  Patient informed of Pessary vs  surgical correction management as needed in the future.  Patient voiced understanding and agreement.  F/U in 3 months for reassessment and Breast/Pelvic exam.  2. S/P total hysterectomy  Other orders - ZINC-VITAMIN C PO; Take 500 mg by mouth daily. - Multiple Vitamin (MULTIVITAMIN PO); Take by mouth. - amoxicillin (AMOXIL) 500 MG capsule; Take 500 mg by mouth. When going to the dentist (Patient not taking: Reported on 10/17/2022)   Princess Bruins MD, 8:42 AM 10/17/2022

## 2022-10-22 ENCOUNTER — Ambulatory Visit
Admission: RE | Admit: 2022-10-22 | Discharge: 2022-10-22 | Disposition: A | Discharge status: Home / Self Care | Payer: Medicare Other | Source: Ambulatory Visit | Attending: Family Medicine | Admitting: Family Medicine

## 2022-10-22 DIAGNOSIS — Z1231 Encounter for screening mammogram for malignant neoplasm of breast: Secondary | ICD-10-CM | POA: Diagnosis not present

## 2023-01-06 ENCOUNTER — Other Ambulatory Visit: Payer: Self-pay | Admitting: Cardiovascular Disease

## 2023-01-06 ENCOUNTER — Other Ambulatory Visit: Payer: Self-pay | Admitting: Family Medicine

## 2023-01-06 DIAGNOSIS — E039 Hypothyroidism, unspecified: Secondary | ICD-10-CM

## 2023-01-06 DIAGNOSIS — I10 Essential (primary) hypertension: Secondary | ICD-10-CM

## 2023-01-06 MED ORDER — NEBIVOLOL HCL 5 MG PO TABS: 5.0000 mg | ORAL_TABLET | Freq: Every day | ORAL | 0 refills | Status: DC

## 2023-01-06 MED ORDER — ROSUVASTATIN CALCIUM 20 MG PO TABS: 20.0000 mg | ORAL_TABLET | Freq: Every day | ORAL | 0 refills | Status: DC

## 2023-01-22 ENCOUNTER — Ambulatory Visit: Payer: Medicare Other | Admitting: Obstetrics & Gynecology

## 2023-01-29 DIAGNOSIS — L570 Actinic keratosis: Secondary | ICD-10-CM | POA: Diagnosis not present

## 2023-01-29 DIAGNOSIS — L821 Other seborrheic keratosis: Secondary | ICD-10-CM | POA: Diagnosis not present

## 2023-01-29 DIAGNOSIS — L814 Other melanin hyperpigmentation: Secondary | ICD-10-CM | POA: Diagnosis not present

## 2023-01-29 DIAGNOSIS — D225 Melanocytic nevi of trunk: Secondary | ICD-10-CM | POA: Diagnosis not present

## 2023-02-10 ENCOUNTER — Ambulatory Visit: Payer: Medicare Other | Admitting: Obstetrics & Gynecology

## 2023-02-19 ENCOUNTER — Encounter: Payer: Self-pay | Admitting: Obstetrics & Gynecology

## 2023-02-19 ENCOUNTER — Ambulatory Visit (INDEPENDENT_AMBULATORY_CARE_PROVIDER_SITE_OTHER): Payer: Medicare Other | Admitting: Obstetrics & Gynecology

## 2023-02-19 VITALS — BP 122/78 | HR 73 | Ht 61.25 in | Wt 115.0 lb

## 2023-02-19 DIAGNOSIS — Z780 Asymptomatic menopausal state: Secondary | ICD-10-CM

## 2023-02-19 DIAGNOSIS — Z9289 Personal history of other medical treatment: Secondary | ICD-10-CM

## 2023-02-19 DIAGNOSIS — Z9189 Other specified personal risk factors, not elsewhere classified: Secondary | ICD-10-CM

## 2023-02-19 DIAGNOSIS — B009 Herpesviral infection, unspecified: Secondary | ICD-10-CM

## 2023-02-19 DIAGNOSIS — Z01419 Encounter for gynecological examination (general) (routine) without abnormal findings: Secondary | ICD-10-CM

## 2023-02-19 DIAGNOSIS — N8110 Cystocele, unspecified: Secondary | ICD-10-CM

## 2023-02-19 DIAGNOSIS — Z90710 Acquired absence of both cervix and uterus: Secondary | ICD-10-CM

## 2023-02-19 NOTE — Progress Notes (Signed)
Cynthia Burns 05-30-1946 [id number]   History:    77 y.o. G1P1L1 Widowed.  RP:  Established patient presenting for annual gyn exam   HPI:  S/P Total hysterectomy with conservation of ovaries at age 28, patho benign.  Post menopause, well on no HRT.  Paps normal post Hysterectomy.  Round bulge at the vagina/vulva when gets up in the morning.  No pain. Not feeling it the rest of the day.  Cystocele grade 3/4 per gyn exam with me 09/2022. Abstinent.  Urine normal.  No Urinary incontinence.  BMs normal.  Breasts normal.  Mammo Neg 10/2022. BMI 21.55. BD normal 09/2020.  Colono 10/2013, repeat this year.  Past medical history,surgical history, family history and social history were all reviewed and documented in the EPIC chart.  Gynecologic History No LMP recorded. Patient has had a hysterectomy.  Obstetric History OB History  Gravida Para Term Preterm AB Living  '1 1 1     1  '$ SAB IAB Ectopic Multiple Live Births               # Outcome Date GA Lbr Len/2nd Weight Sex Delivery Anes PTL Lv  1 Term              ROS: A ROS was performed and pertinent positives and negatives are included in the history. GENERAL: No fevers or chills. HEENT: No change in vision, no earache, sore throat or sinus congestion. NECK: No pain or stiffness. CARDIOVASCULAR: No chest pain or pressure. No palpitations. PULMONARY: No shortness of breath, cough or wheeze. GASTROINTESTINAL: No abdominal pain, nausea, vomiting or diarrhea, melena or bright red blood per rectum. GENITOURINARY: No urinary frequency, urgency, hesitancy or dysuria. MUSCULOSKELETAL: No joint or muscle pain, no back pain, no recent trauma. DERMATOLOGIC: No rash, no itching, no lesions. ENDOCRINE: No polyuria, polydipsia, no heat or cold intolerance. No recent change in weight. HEMATOLOGICAL: No anemia or easy bruising or bleeding. NEUROLOGIC: No headache, seizures, numbness, tingling or weakness. PSYCHIATRIC: No depression, no loss of interest in  normal activity or change in sleep pattern.     Exam:   BP 122/78   Pulse 73   Ht 5' 1.25" (1.556 m)   Wt 115 lb (52.2 kg)   SpO2 98%   BMI 21.55 kg/m   Body mass index is 21.55 kg/m.  General appearance : Well developed well nourished female. No acute distress HEENT: Eyes: no retinal hemorrhage or exudates,  Neck supple, trachea midline, no carotid bruits, no thyroidmegaly Lungs: Clear to auscultation, no rhonchi or wheezes, or rib retractions  Heart: Regular rate and rhythm, no murmurs or gallops Breast:Examined in sitting and supine position were symmetrical in appearance, no palpable masses or tenderness,  no skin retraction, no nipple inversion, no nipple discharge, no skin discoloration, no axillary or supraclavicular lymphadenopathy Abdomen: no palpable masses or tenderness, no rebound or guarding Extremities: no edema or skin discoloration or tenderness  Pelvic: Vulva: Normal             Vagina: No gross lesions or discharge  Cervix/Uterus absent  Adnexa  Without masses or tenderness  Anus: Normal   Assessment/Plan:  77 y.o. female for annual exam   1. Well female exam with routine gynecological exam S/P Total hysterectomy with conservation of ovaries at age 31, patho benign.  Post menopause, well on no HRT.  Paps normal post Hysterectomy.  Round bulge at the vagina/vulva when gets up in the morning.  No pain. Not feeling it the  rest of the day. Cystocele grade 3/4 per gyn exam with me 09/2022. Abstinent. Urine normal.  No Urinary incontinence.  BMs normal.  Breasts normal.  Mammo Neg 10/2022. BMI 21.55. BD normal 09/2020.  Colono 10/2013, repeat this year.  2. S/P total hysterectomy  3. Postmenopause S/P Total hysterectomy with conservation of ovaries at age 47, patho benign.  Post menopause, well on no HRT.   4. Baden-Walker grade 3 cystocele Round bulge at the vagina/vulva when gets up in the morning.  No pain. Not feeling it the rest of the day. Cystocele grade  3/4 per gyn exam with me 09/2022. Abstinent. Urine normal.  No Urinary incontinence. Will observe.  5. Other specified personal risk factors, not elsewhere classified  6. Personal history of other medical treatment  Other orders - BIOTIN PO; Take by mouth. - CALCIUM PO; Take by mouth. 1-2 times a week   Princess Bruins MD, 11:43 AM

## 2023-02-23 ENCOUNTER — Encounter: Payer: Self-pay | Admitting: Family Medicine

## 2023-02-23 ENCOUNTER — Ambulatory Visit (INDEPENDENT_AMBULATORY_CARE_PROVIDER_SITE_OTHER): Payer: Medicare Other | Admitting: Family Medicine

## 2023-02-23 VITALS — BP 138/88 | HR 66 | Temp 97.8°F | Resp 18 | Ht 61.25 in | Wt 115.2 lb

## 2023-02-23 DIAGNOSIS — E1165 Type 2 diabetes mellitus with hyperglycemia: Secondary | ICD-10-CM | POA: Diagnosis not present

## 2023-02-23 DIAGNOSIS — E1169 Type 2 diabetes mellitus with other specified complication: Secondary | ICD-10-CM

## 2023-02-23 DIAGNOSIS — E785 Hyperlipidemia, unspecified: Secondary | ICD-10-CM

## 2023-02-23 DIAGNOSIS — I10 Essential (primary) hypertension: Secondary | ICD-10-CM | POA: Diagnosis not present

## 2023-02-23 DIAGNOSIS — D509 Iron deficiency anemia, unspecified: Secondary | ICD-10-CM | POA: Diagnosis not present

## 2023-02-23 DIAGNOSIS — E039 Hypothyroidism, unspecified: Secondary | ICD-10-CM

## 2023-02-23 DIAGNOSIS — H9193 Unspecified hearing loss, bilateral: Secondary | ICD-10-CM

## 2023-02-23 LAB — COMPREHENSIVE METABOLIC PANEL
ALT: 22 U/L (ref 0–35)
AST: 25 U/L (ref 0–37)
Albumin: 3.7 g/dL (ref 3.5–5.2)
Alkaline Phosphatase: 36 U/L — ABNORMAL LOW (ref 39–117)
BUN: 19 mg/dL (ref 6–23)
CO2: 27 mEq/L (ref 19–32)
Calcium: 9.2 mg/dL (ref 8.4–10.5)
Chloride: 100 mEq/L (ref 96–112)
Creatinine, Ser: 1 mg/dL (ref 0.40–1.20)
GFR: 55.24 mL/min — ABNORMAL LOW (ref >60.00)
Glucose, Bld: 85 mg/dL (ref 70–99)
Potassium: 3.9 mEq/L (ref 3.5–5.1)
Sodium: 132 mEq/L — ABNORMAL LOW (ref 135–145)
Total Bilirubin: 0.3 mg/dL (ref 0.2–1.2)
Total Protein: 10.6 g/dL — ABNORMAL HIGH (ref 6.0–8.3)

## 2023-02-23 LAB — LIPID PANEL
Cholesterol: 97 mg/dL (ref 0–200)
HDL: 46.6 mg/dL (ref >39.00)
LDL Cholesterol: 29 mg/dL (ref 0–99)
NonHDL: 50.76
Total CHOL/HDL Ratio: 2
Triglycerides: 107 mg/dL (ref 0.0–149.0)
VLDL: 21.4 mg/dL (ref 0.0–40.0)

## 2023-02-23 LAB — CBC WITH DIFFERENTIAL/PLATELET
Basophils Absolute: 0.1 10*3/uL (ref 0.0–0.1)
Basophils Relative: 0.7 % (ref 0.0–3.0)
Eosinophils Absolute: 0.1 10*3/uL (ref 0.0–0.7)
Eosinophils Relative: 1.2 % (ref 0.0–5.0)
HCT: 31.9 % — ABNORMAL LOW (ref 36.0–46.0)
Hemoglobin: 10.9 g/dL — ABNORMAL LOW (ref 12.0–15.0)
Lymphocytes Relative: 32.8 % (ref 12.0–46.0)
Lymphs Abs: 3.3 10*3/uL (ref 0.7–4.0)
MCHC: 34.2 g/dL (ref 30.0–36.0)
MCV: 95.7 fl (ref 78.0–100.0)
Monocytes Absolute: 0.7 10*3/uL (ref 0.1–1.0)
Monocytes Relative: 7.4 % (ref 3.0–12.0)
Neutro Abs: 5.8 10*3/uL (ref 1.4–7.7)
Neutrophils Relative %: 57.9 % (ref 43.0–77.0)
Platelets: 310 10*3/uL (ref 150.0–400.0)
RBC: 3.33 Mil/uL — ABNORMAL LOW (ref 3.87–5.11)
RDW: 14 % (ref 11.5–15.5)
WBC: 10.1 10*3/uL (ref 4.0–10.5)

## 2023-02-23 LAB — MICROALBUMIN / CREATININE URINE RATIO
Creatinine,U: 104.8 mg/dL
Microalb Creat Ratio: 1.8 mg/g (ref 0.0–30.0)
Microalb, Ur: 1.9 mg/dL (ref 0.0–1.9)

## 2023-02-23 LAB — HEMOGLOBIN A1C: Hgb A1c MFr Bld: 6.3 % (ref 4.6–6.5)

## 2023-02-23 LAB — TSH: TSH: 2.11 u[IU]/mL (ref 0.35–5.50)

## 2023-02-23 MED ORDER — LEVOTHYROXINE SODIUM 25 MCG PO TABS: ORAL_TABLET | ORAL | 1 refills | Status: DC

## 2023-02-23 MED ORDER — METFORMIN HCL 850 MG PO TABS: ORAL_TABLET | ORAL | 1 refills | Status: DC

## 2023-02-23 MED ORDER — RYBELSUS 7 MG PO TABS: 1.0000 | ORAL_TABLET | Freq: Every day | ORAL | 1 refills | Status: DC

## 2023-02-23 NOTE — Assessment & Plan Note (Signed)
Encourage heart healthy diet such as MIND or DASH diet, increase exercise, avoid trans fats, simple carbohydrates and processed foods, consider a krill or fish or flaxseed oil cap daily.  °

## 2023-02-23 NOTE — Assessment & Plan Note (Signed)
Well controlled, no changes to meds. Encouraged heart healthy diet such as the DASH diet and exercise as tolerated.   On losartan and bystolic

## 2023-02-23 NOTE — Patient Instructions (Signed)

## 2023-02-23 NOTE — Assessment & Plan Note (Signed)
Check labs  Con't synthroid

## 2023-02-23 NOTE — Progress Notes (Signed)
Established Patient Office Visit  Subjective   [name], female    DOB: September 06, 1946  Age: 77 y.o. MRN: [id number]  Chief Complaint  Patient presents with   Hypothyroidism   Diabetes   Follow-up    HPI Pt is here to f/u dm, chol, bp and thyroid   HPI HYPERTENSION  Blood pressure range-not checking   Chest pain- no      Dyspnea- no Lightheadedness- no   Edema- no Other side effects - no   Medication compliance: good Low salt diet- yes  DIABETES  Blood Sugar ranges-good per pr   Polyuria- no New Visual problems- no Hypoglycemic symptoms- no Other side effects-no Medication compliance - good  Foot exam- today  HYPERLIPIDEMIA  Medication compliance- good  RUQ pain- no  Muscle aches- no Other side effects-no    Patient Active Problem List   Diagnosis Date Noted   Hypothyroidism 02/23/2023   Type 2 diabetes mellitus with hyperglycemia, without long-term current use of insulin ([place]) 07/26/2021   Impacted cerumen of right ear 07/06/2020   Type 2 diabetes mellitus with hyperglycemia ([place]) 04/11/2019   Hyperlipidemia LDL goal <70 03/23/2017   Abdominal pain, epigastric 09/03/2016   Strain of right biceps 03/17/2016   Right shoulder pain 03/17/2016   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   Plantar wart of right foot 03/15/2015   Uncontrolled type 2 diabetes mellitus with hyperglycemia ([place]) 09/16/2013   Elevated lipids 09/05/2013   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   OVERWEIGHT 02/07/2009   PHARYNGITIS, ACUTE 06/03/2007   Essential hypertension 05/18/2007   MITRAL VALVE PROLAPSE 05/18/2007   OSTEOPENIA 05/18/2007   Past Medical History:  Diagnosis Date   Abnormal Pap smear of cervix    pt not 100 percent sure but believes she did   Anemia    Diabetes mellitus without complication ([place])    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   HSV-1 infection    HYPERTENSION    Hypothyroid    MITRAL VALVE PROLAPSE    OSTEOPENIA    Overweight(278.02)     PHARYNGITIS, ACUTE    Past Surgical History:  Procedure Laterality Date   ABDOMINAL HYSTERECTOMY     CHOLECYSTECTOMY     WISDOM TOOTH EXTRACTION     Social History   Tobacco Use   Smoking status: Never   Smokeless tobacco: Never  Vaping Use   Vaping Use: Never used  Substance Use Topics   Alcohol use: No    Alcohol/week: 0.0 standard drinks of alcohol   Drug use: No   Social History   Socioeconomic History   Marital status: Married    Spouse name: Not on file   Number of children: Not on file   Years of education: Not on file   Highest education level: Not on file  Occupational History   Occupation: hairdresser  Tobacco Use   Smoking status: Never   Smokeless tobacco: Never  Vaping Use   Vaping Use: Never used  Substance and Sexual Activity   Alcohol use: No    Alcohol/week: 0.0 standard drinks of alcohol   Drug use: No   Sexual activity: Not Currently    Partners: Male    Birth control/protection: Surgical, Abstinence    Comment: hysterectomy, 17, less than 5  Other Topics Concern   Not on file  Social History Narrative   Not on file   Social Determinants of Health   Financial Resource Strain: Low Risk  (07/23/2021)   Overall  Financial Resource Strain (CARDIA)    Difficulty of Paying Living Expenses: Not hard at all  Food Insecurity: No Food Insecurity (07/23/2021)   Hunger Vital Sign    Worried About Running Out of Food in the Last Year: Never true    Ran Out of Food in the Last Year: Never true  Transportation Needs: No Transportation Needs (07/23/2021)   PRAPARE - Hydrologist (Medical): No    Lack of Transportation (Non-Medical): No  Physical Activity: Inactive (07/23/2021)   Exercise Vital Sign    Days of Exercise per Week: 0 days    Minutes of Exercise per Session: 0 min  Stress: No Stress Concern Present (07/23/2021)   [place]    Feeling of Stress : Not  at all  Social Connections: Socially Integrated (07/23/2021)   Social Connection and Isolation Panel [NHANES]    Frequency of Communication with Friends and Family: More than three times a week    Frequency of Social Gatherings with Friends and Family: More than three times a week    Attends Religious Services: More than 4 times per year    Active Member of [employer] or Organizations: Yes    Attends Music therapist: More than 4 times per year    Marital Status: Married  Human resources officer Violence: Not At Risk (07/23/2021)   Humiliation, Afraid, Rape, and Kick questionnaire    Fear of Current or Ex-Partner: No    Emotionally Abused: No    Physically Abused: No    Sexually Abused: No   Family Status  Relation Name Status   Mother  Deceased   Father  Deceased   Sister  (Not Specified)   MGM  Deceased   MGF  Deceased   PGM  Deceased   PGF  Deceased   Other  (Not Specified)   Neg Hx  (Not Specified)   Family History  Problem Relation Age of Onset   Hypertension Mother    Heart disease Mother        Had a stent   Emphysema Father    Diabetes Father    Stroke Sister    Breast cancer Neg Hx    Allergies  Allergen Reactions   Mucinex [Guaifenesin Er] Anaphylaxis   Codeine Nausea And Vomiting   Paxlovid [Nirmatrelvir-Ritonavir] Diarrhea and Nausea And Vomiting   Advicor [Niacin-Lovastatin Er] Rash   Amlodipine Rash   Lisinopril-Hydrochlorothiazide Rash   Other Rash    Eye drops with preservatives.     Ramipril Rash   Sulfonamide Derivatives Rash      Review of Systems  Constitutional:  Negative for fever and malaise/fatigue.  HENT:  Negative for congestion.   Eyes:  Negative for blurred vision.  Respiratory:  Negative for shortness of breath.   Cardiovascular:  Negative for chest pain, palpitations and leg swelling.  Gastrointestinal:  Negative for abdominal pain, blood in stool and nausea.  Genitourinary:  Negative for dysuria and frequency.  Musculoskeletal:   Negative for falls.  Skin:  Negative for rash.  Neurological:  Negative for dizziness, loss of consciousness and headaches.  Endo/Heme/Allergies:  Negative for environmental allergies.  Psychiatric/Behavioral:  Negative for depression. The patient is not nervous/anxious.       Objective:     BP 138/88 (BP Location: Left Arm, Patient Position: Sitting, Cuff Size: Normal)   Pulse 66   Temp 97.8 F (36.6 C) (Oral)   Resp 18  Ht 5' 1.25" (1.556 m)   Wt 115 lb 3.2 oz (52.3 kg)   SpO2 97%   BMI 21.59 kg/m  BP Readings from Last 3 Encounters:  02/23/23 138/88  02/19/23 122/78  10/17/22 124/82   Wt Readings from Last 3 Encounters:  02/23/23 115 lb 3.2 oz (52.3 kg)  02/19/23 115 lb (52.2 kg)  08/22/22 115 lb 9.6 oz (52.4 kg)   SpO2 Readings from Last 3 Encounters:  02/23/23 97%  02/19/23 98%  10/17/22 99%      Physical Exam Vitals and nursing note reviewed.  Constitutional:      Appearance: She is well-developed.  HENT:     Head: Normocephalic and atraumatic.  Eyes:     Conjunctiva/sclera: Conjunctivae normal.  Neck:     Thyroid: No thyromegaly.     Vascular: No carotid bruit or JVD.  Cardiovascular:     Rate and Rhythm: Normal rate and regular rhythm.     Heart sounds: Normal heart sounds. No murmur heard. Pulmonary:     Effort: Pulmonary effort is normal. No respiratory distress.     Breath sounds: Normal breath sounds. No wheezing or rales.  Chest:     Chest wall: No tenderness.  Musculoskeletal:     Cervical back: Normal range of motion and neck supple.  Neurological:     General: No focal deficit present.     Mental Status: She is alert and oriented to person, place, and time.  Psychiatric:        Mood and Affect: Mood normal.        Behavior: Behavior normal.        Thought Content: Thought content normal.        Judgment: Judgment normal.      No results found for any visits on 02/23/23.  Last CBC Lab Results  Component Value Date   WBC 7.4  08/22/2022   HGB 11.8 (L) 08/22/2022   HCT 34.8 (L) 08/22/2022   MCV 93.0 08/22/2022   RDW 13.9 08/22/2022   PLT 263.0 [id number]   Last metabolic panel Lab Results  Component Value Date   GLUCOSE 85 08/22/2022   NA 138 08/22/2022   K 4.2 08/22/2022   CL 103 08/22/2022   CO2 28 08/22/2022   BUN 12 08/22/2022   CREATININE 0.84 08/22/2022   CALCIUM 9.2 08/22/2022   PROT 8.0 08/22/2022   ALBUMIN 3.9 08/22/2022   BILITOT 0.4 08/22/2022   ALKPHOS 28 (L) 08/22/2022   AST 17 08/22/2022   ALT 15 08/22/2022   Last lipids Lab Results  Component Value Date   CHOL 105 08/22/2022   HDL 46.90 08/22/2022   LDLCALC 41 08/22/2022   LDLDIRECT 53.0 05/25/2018   TRIG 85.0 08/22/2022   CHOLHDL 2 08/22/2022   Last hemoglobin A1c Lab Results  Component Value Date   HGBA1C 6.2 08/22/2022   Last thyroid functions Lab Results  Component Value Date   TSH 1.13 08/22/2022   T4TOTAL 6.6 02/24/2022   Last vitamin D Lab Results  Component Value Date   VD25OH 52.56 02/06/2022   Last vitamin B12 and Folate No results found for: "VITAMINB12", "FOLATE"    The ASCVD Risk score (Arnett DK, et al., 2019) failed to calculate for the following reasons:   The valid total cholesterol range is 130 to 320 mg/dL    Assessment & Plan:   Problem List Items Addressed This Visit       Unprioritized   Uncontrolled type 2 diabetes mellitus with  hyperglycemia ([place]) - Primary   Relevant Medications   metFORMIN (GLUCOPHAGE) 850 MG tablet   Semaglutide (RYBELSUS) 7 MG TABS   Other Relevant Orders   Hemoglobin A1c   Microalbumin / creatinine urine ratio   Type 2 diabetes mellitus with hyperglycemia ([place])    hgba1c to be checked , minimize simple carbs. Increase exercise as tolerated. Continue current meds       Relevant Medications   metFORMIN (GLUCOPHAGE) 850 MG tablet   Semaglutide (RYBELSUS) 7 MG TABS   Hypothyroidism    Check labs  Con't synthroid      Relevant Medications    levothyroxine (SYNTHROID) 25 MCG tablet   Other Relevant Orders   TSH   Hyperlipidemia LDL goal <70    Encourage heart healthy diet such as MIND or DASH diet, increase exercise, avoid trans fats, simple carbohydrates and processed foods, consider a krill or fish or flaxseed oil cap daily.        Essential hypertension    Well controlled, no changes to meds. Encouraged heart healthy diet such as the DASH diet and exercise as tolerated.   On losartan and bystolic       Relevant Orders   CBC with Differential/Platelet   Comprehensive metabolic panel   Other Visit Diagnoses     Hyperlipidemia associated with type 2 diabetes mellitus ([place])       Relevant Medications   metFORMIN (GLUCOPHAGE) 850 MG tablet   Semaglutide (RYBELSUS) 7 MG TABS   Other Relevant Orders   Lipid panel   Comprehensive metabolic panel   Iron deficiency anemia, unspecified iron deficiency anemia type       Relevant Orders   CBC with Differential/Platelet   Bilateral hearing loss, unspecified hearing loss type       Relevant Orders   Ambulatory referral to ENT       Return in about 6 months (around 08/26/2023), or if symptoms worsen or fail to improve, for hypertension, hyperlipidemia, diabetes II.    Ann Held, DO

## 2023-02-23 NOTE — Assessment & Plan Note (Signed)
hgba1c to be checked, minimize simple carbs. Increase exercise as tolerated. Continue current meds  

## 2023-02-26 NOTE — Addendum Note (Signed)
Addended byDamita Dunnings D on: 02/26/2023 03:23 PM   Modules accepted: Orders

## 2023-03-02 DIAGNOSIS — H6121 Impacted cerumen, right ear: Secondary | ICD-10-CM | POA: Diagnosis not present

## 2023-03-02 DIAGNOSIS — H903 Sensorineural hearing loss, bilateral: Secondary | ICD-10-CM | POA: Diagnosis not present

## 2023-03-02 DIAGNOSIS — H838X3 Other specified diseases of inner ear, bilateral: Secondary | ICD-10-CM | POA: Diagnosis not present

## 2023-03-09 NOTE — Progress Notes (Signed)
[name], female   DOB: Apr 16, 1946, 77 y.o.   MRN: [id number]     Cynthia Burns returns today for followup. 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  All other systems reviewed and negative  General: BP (!) 158/80   Pulse 70   Ht 5' 1.5" (1.562 m)   Wt 116 lb (52.6 kg)   SpO2 99%   BMI 21.56 kg/m  Affect appropriate Healthy:  appears stated age 28: normal Neck supple with no adenopathy JVP normal no bruits no thyromegaly Lungs clear with no wheezing and good diaphragmatic motion Heart:  S1/S2 no murmur, no rub, gallop or click PMI normal Abdomen: benighn, post lap choly  Distal pulses intact with no bruits No edema Neuro non-focal Skin warm and dry No muscular weakness    Current Outpatient Medications  Medication Sig Dispense Refill   amoxicillin (AMOXIL) 500 MG capsule Take 500 mg by mouth. When going to the dentist     aspirin EC 81 MG tablet Take 1 tablet (81 mg total) by mouth daily. 90 tablet 3   BIOTIN PO Take by mouth.     Blood Glucose Monitoring Suppl (ONE TOUCH ULTRA 2) w/Device KIT CHECK BLOOD SUGAR TWICE DAILY.  DX CODE  E11.9 1 kit 0   CALCIUM PO Take by mouth. 1-2 times a week     Cyanocobalamin  (VITAMIN B-12 PO) Take 1 tablet by mouth daily.      fexofenadine (ALLEGRA) 180 MG tablet Take 180 mg by mouth daily.     furosemide (LASIX) 20 MG tablet Take 0.5 tablets (10 mg total) by mouth daily. Pt needs to keep upcoming appt in Dec for further refills 90 tablet 3   glucose blood (ONETOUCH ULTRA) test strip Check blood sugars twice daily 200 each 12   Lancets (ONETOUCH DELICA PLUS 123XX123) MISC USE 1 LANCET TO TEST AS DIRECTED (DISCARD LANCET IN SHARPS CONTAINER IMMEDIATELY AFTER USE) 200 each 1   levothyroxine (SYNTHROID) 25 MCG tablet TAKE ONE TABLET BY MOUTH EVERY DAY BEFORE BREAKFAST 90 tablet 1   losartan (COZAAR) 25 MG tablet TAKE ONE TABLET BY MOUTH EVERY DAY 90 tablet 0   magnesium oxide (MAG-OX) 400 MG tablet Take 400 mg by mouth daily.     metFORMIN (GLUCOPHAGE) 850 MG tablet TAKE ONE TABLET BY MOUTH THREE TIMES A DAY WITH FOOD 270 tablet 1   Multiple Vitamin (MULTIVITAMIN PO) Take by mouth. With iron     Multiple Vitamins-Minerals (PRESERVISION AREDS 2) CAPS Take by mouth.     nebivolol (BYSTOLIC) 5 MG tablet Take 1 tablet (5 mg total) by mouth daily. 90 tablet 0   rosuvastatin (CRESTOR) 20 MG tablet Take 1 tablet (  20 mg total) by mouth daily. 90 tablet 0   Semaglutide (RYBELSUS) 7 MG TABS Take 1 tablet (7 mg total) by mouth daily. 90 tablet 1   VITAMIN D, CHOLECALCIFEROL, PO Take by mouth daily. D3     ZINC-VITAMIN C PO Take 500 mg by mouth daily.     No current facility-administered medications for this visit.    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  Continue current medications. A1c 6.3  Obesity: Exercise and  low carb diet discussed Chol: on statin LDL 29 02/23/23 at goal Discussed utility of calcium score HTN:  Well controlled.  Continue current medications and low sodium Dash type diet.   Edema: stable low dose lasix low sodium diet  GB: post lap choly with improved symptoms  Hypothyroid:  on synthroid replacement TSH 2.1 02/23/23   Calcium score  F/U in a year   [employer]

## 2023-03-16 ENCOUNTER — Encounter: Payer: Self-pay | Admitting: Cardiovascular Disease

## 2023-03-16 ENCOUNTER — Ambulatory Visit: Payer: Medicare Other | Attending: Cardiovascular Disease | Admitting: Cardiovascular Disease

## 2023-03-16 VITALS — BP 158/80 | HR 70 | Ht 61.5 in | Wt 116.0 lb

## 2023-03-16 DIAGNOSIS — I10 Essential (primary) hypertension: Secondary | ICD-10-CM

## 2023-03-16 DIAGNOSIS — I7121 Aneurysm of the ascending aorta, without rupture: Secondary | ICD-10-CM | POA: Diagnosis not present

## 2023-03-16 NOTE — Patient Instructions (Addendum)
Medication Instructions:  Your physician recommends that you continue on your current medications as directed. Please refer to the Current Medication list given to you today.  *If you need a refill on your cardiac medications before your next appointment, please call your pharmacy*  Lab Work: If you have labs (blood work) drawn today and your tests are completely normal, you will receive your results only by: [place] (if you have MyChart) OR A paper copy in the mail If you have any lab test that is abnormal or we need to change your treatment, we will call you to review the results.  Testing/Procedures: Cardiac CT scanning calcium score, (CAT scanning), is a noninvasive, special x-ray that produces cross-sectional images of the body using x-rays and a computer. CT scans help physicians diagnose and treat medical conditions. For some CT exams, a contrast material is used to enhance visibility in the area of the body being studied. CT scans provide greater clarity and reveal more details than regular x-ray exams.  Follow-Up: At [facility], you and your health needs are our priority.  As part of our continuing mission to provide you with exceptional heart care, we have created designated Provider Care Teams.  These Care Teams include your primary Cardiologist (physician) and Advanced Practice Providers (APPs -  Physician Assistants and Nurse Practitioners) who all work together to provide you with the care you need, when you need it.  We recommend signing up for the patient portal called "MyChart".  Sign up information is provided on this After Visit Summary.  MyChart is used to connect with patients for Virtual Visits (Telemedicine).  Patients are able to view lab/test results, encounter notes, upcoming appointments, etc.  Non-urgent messages can be sent to your provider as well.   To learn more about what you can do with MyChart, go to [email or web address].    Your next  appointment:   1 year(s)  Provider:   Jenkins Rouge, MD

## 2023-03-17 ENCOUNTER — Other Ambulatory Visit: Payer: Self-pay | Admitting: Cardiovascular Disease

## 2023-03-17 DIAGNOSIS — I10 Essential (primary) hypertension: Secondary | ICD-10-CM

## 2023-03-27 ENCOUNTER — Telehealth: Payer: Self-pay | Admitting: Cardiovascular Disease

## 2023-03-27 DIAGNOSIS — I10 Essential (primary) hypertension: Secondary | ICD-10-CM

## 2023-03-27 NOTE — Telephone Encounter (Signed)
Spoke to the patient, and explained Dr. Eden Emms recommendation.   Per Dr. Eden Emms  Can increase losartan to 50 mg daily   Patient stated she will continue to monitor her blood pressure daily and will call at the end of next week to give the readings. Will forward to MD and nurse.

## 2023-03-27 NOTE — Telephone Encounter (Signed)
BP Readings:  3/30 - 150/75  3/31 - 145/74 - 8:55 am 3/31 - 153/76 - 4:09 pm  4/1 - 146/76 - 12:58 pm  4/2 - 126/80 - 8:38 am  4/3 - 158/87 - 8:39 am 4/3 - 164/79 - 10:09 am  4/4 - 154/75 - 8:58 am

## 2023-04-06 ENCOUNTER — Telehealth: Payer: Self-pay | Admitting: Cardiovascular Disease

## 2023-04-06 DIAGNOSIS — I10 Essential (primary) hypertension: Secondary | ICD-10-CM

## 2023-04-06 MED ORDER — LOSARTAN POTASSIUM 50 MG PO TABS: 50.0000 mg | ORAL_TABLET | Freq: Every day | ORAL | 3 refills | Status: DC

## 2023-04-06 MED ORDER — LOSARTAN POTASSIUM 25 MG PO TABS: ORAL_TABLET | ORAL | 3 refills | Status: DC

## 2023-04-06 NOTE — Telephone Encounter (Signed)
Pt's medication was sent to pt's pharmacy as requested. Confirmation received.  °

## 2023-04-06 NOTE — Telephone Encounter (Signed)
*  STAT* If patient is at the pharmacy, call can be transferred to refill team.   1. Which medications need to be refilled? (please list name of each medication and dose if known)  losartan (COZAAR) 25 MG tablet   2. Which pharmacy/location (including street and city if local pharmacy) is medication to be sent to? [facility] - [place], [place] - [address]   3. Do they need a 30 day or 90 day supply? 90

## 2023-04-06 NOTE — Telephone Encounter (Signed)
This medication change was not changed on pt's medication list. Please address

## 2023-04-06 NOTE — Telephone Encounter (Signed)
Called patient back about message. Patient has been taking the losartan and the most recent readings are with the increased dose of losartan 50 mg. Will send in new prescription with increased dose to patient's pharmacy of choice.

## 2023-04-06 NOTE — Telephone Encounter (Signed)
BP Readings:  04/09 - 140/73 HR 78 04/10 - 127/76 HR 77 04/11 - 136/77 HR 67 04/12 - 139/73 HR 72 04/13 - 132/69 HR 68 04/14 - 137/76 HR 76 04/15 - 135/68 HR 62

## 2023-04-15 ENCOUNTER — Other Ambulatory Visit (HOSPITAL_BASED_OUTPATIENT_CLINIC_OR_DEPARTMENT_OTHER): Payer: Medicare Other

## 2023-04-22 ENCOUNTER — Telehealth: Payer: Self-pay | Admitting: Cardiovascular Disease

## 2023-04-22 NOTE — Telephone Encounter (Signed)
Pt c/o medication issue:  1. Name of Medication: losartan (COZAAR) 50 MG tablet   2. How are you currently taking this medication (dosage and times per day)?   Take 1 tablet (50 mg total) by mouth daily. TAKE ONE TABLET BY MOUTH EVERY DAY    3. Are you having a reaction (difficulty breathing--STAT)? no  4. What is your medication issue? Patient called stating she received a letter from [facility] about this medication dated 04/13/24, she states the medication was recently increased from 25 mg to 50 mg.  The letter states that they had tried to reached [place] about the medication increase and they didn't get a response from [place].  They were trying to reach [place] to clarify the increase and to make sure it was okay to send her the 50 mg tablets.  She states has been taking two 25mg  tablets.  Phone number to [facility] is [phone]. RX ref # [id number] and [id number].

## 2023-04-22 NOTE — Telephone Encounter (Signed)
Called patient's pharmacy. Informed them that patient is suppose to be on losartan 50 mg by mouth daily through voicemail. Had to leave voicemail, because they were not talking to anyone today. Called patient to let her know that we had contacted the pharmacy and hopefully everything is straight now. Patient stated she has enough losartan 25 mg tablets to take 2 tablets for several weeks, until she gets the 50 mg tablet. Informed patient that is she has any more issues or concerns to please give our office a call.

## 2023-05-04 ENCOUNTER — Other Ambulatory Visit (HOSPITAL_BASED_OUTPATIENT_CLINIC_OR_DEPARTMENT_OTHER): Payer: Medicare Other

## 2023-06-09 ENCOUNTER — Ambulatory Visit (HOSPITAL_BASED_OUTPATIENT_CLINIC_OR_DEPARTMENT_OTHER)
Admission: RE | Admit: 2023-06-09 | Discharge: 2023-06-09 | Disposition: A | Discharge status: Home / Self Care | Payer: Medicare Other | Source: Ambulatory Visit | Attending: Cardiovascular Disease | Admitting: Cardiovascular Disease

## 2023-06-09 DIAGNOSIS — I7121 Aneurysm of the ascending aorta, without rupture: Secondary | ICD-10-CM | POA: Insufficient documentation

## 2023-06-09 DIAGNOSIS — I10 Essential (primary) hypertension: Secondary | ICD-10-CM | POA: Insufficient documentation

## 2023-06-11 ENCOUNTER — Telehealth: Payer: Self-pay | Admitting: Cardiovascular Disease

## 2023-06-11 NOTE — Telephone Encounter (Signed)
Pt returning call for CT results  

## 2023-06-11 NOTE — Telephone Encounter (Signed)
The patient has been notified of the result and verbalized understanding.  All questions (if any) were answered. Asencion Gowda, LPN 07/16/3663 4:03 PM

## 2023-06-16 DIAGNOSIS — H524 Presbyopia: Secondary | ICD-10-CM | POA: Diagnosis not present

## 2023-06-16 DIAGNOSIS — E113392 Type 2 diabetes mellitus with moderate nonproliferative diabetic retinopathy without macular edema, left eye: Secondary | ICD-10-CM | POA: Diagnosis not present

## 2023-06-16 LAB — HM DIABETES EYE EXAM

## 2023-06-30 ENCOUNTER — Other Ambulatory Visit: Payer: Self-pay | Admitting: Family Medicine

## 2023-06-30 ENCOUNTER — Other Ambulatory Visit: Payer: Self-pay | Admitting: Cardiovascular Disease

## 2023-06-30 DIAGNOSIS — I10 Essential (primary) hypertension: Secondary | ICD-10-CM

## 2023-06-30 DIAGNOSIS — E039 Hypothyroidism, unspecified: Secondary | ICD-10-CM

## 2023-07-31 ENCOUNTER — Ambulatory Visit: Payer: Medicare Other | Admitting: *Deleted

## 2023-07-31 VITALS — Ht 61.5 in | Wt 113.0 lb

## 2023-07-31 DIAGNOSIS — Z0000 Encounter for general adult medical examination without abnormal findings: Secondary | ICD-10-CM | POA: Diagnosis not present

## 2023-07-31 NOTE — Patient Instructions (Signed)
[name] , Thank you for taking time to come for your Medicare Wellness Visit. I appreciate your ongoing commitment to your health goals. Please review the following plan we discussed and let me know if I can assist you in the future.    This is a list of the screening recommended for you and due dates:  Health Maintenance  Topic Date Due   COVID-19 Vaccine (4 - 2023-24 season) 08/22/2022   Flu Shot  07/23/2023   Colon Cancer Screening  11/09/2023   Hemoglobin A1C  08/26/2023   Mammogram  10/23/2023   Yearly kidney function blood test for diabetes  02/23/2024   Yearly kidney health urinalysis for diabetes  02/23/2024   Complete foot exam   02/23/2024   Eye exam for diabetics  06/15/2024   Medicare Annual Wellness Visit  07/30/2024   DTaP/Tdap/Td vaccine (2 - Tdap) 07/25/2029   Pneumonia Vaccine  Completed   DEXA scan (bone density measurement)  Completed   Hepatitis C Screening  Completed   Zoster (Shingles) Vaccine  Completed   HPV Vaccine  Aged Out    Next appointment: Follow up in one year for your annual wellness visit.   Preventive Care 55 Years and Older, Female Preventive care refers to lifestyle choices and visits with your health care provider that can promote health and wellness. What does preventive care include? A yearly physical exam. This is also called an annual well check. Dental exams once or twice a year. Routine eye exams. Ask your health care provider how often you should have your eyes checked. Personal lifestyle choices, including: Daily care of your teeth and gums. Regular physical activity. Eating a healthy diet. Avoiding tobacco and drug use. Limiting alcohol use. Practicing safe sex. Taking low-dose aspirin every day. Taking vitamin and mineral supplements as recommended by your health care provider. What happens during an annual well check? The services and screenings done by your health care provider during your annual well check will depend  on your age, overall health, lifestyle risk factors, and family history of disease. Counseling  Your health care provider may ask you questions about your: Alcohol use. Tobacco use. Drug use. Emotional well-being. Home and relationship well-being. Sexual activity. Eating habits. History of falls. Memory and ability to understand (cognition). Work and work Astronomer. Reproductive health. Screening  You may have the following tests or measurements: Height, weight, and BMI. Blood pressure. Lipid and cholesterol levels. These may be checked every 5 years, or more frequently if you are over 24 years old. Skin check. Lung cancer screening. You may have this screening every year starting at age 33 if you have a 30-pack-year history of smoking and currently smoke or have quit within the past 15 years. Fecal occult blood test (FOBT) of the stool. You may have this test every year starting at age 61. Flexible sigmoidoscopy or colonoscopy. You may have a sigmoidoscopy every 5 years or a colonoscopy every 10 years starting at age 55. Hepatitis C blood test. Hepatitis B blood test. Sexually transmitted disease (STD) testing. Diabetes screening. This is done by checking your blood sugar (glucose) after you have not eaten for a while (fasting). You may have this done every 1-3 years. Bone density scan. This is done to screen for osteoporosis. You may have this done starting at age 9. Mammogram. This may be done every 1-2 years. Talk to your health care provider about how often you should have regular mammograms. Talk with your health care provider about  your test results, treatment options, and if necessary, the need for more tests. Vaccines  Your health care provider may recommend certain vaccines, such as: Influenza vaccine. This is recommended every year. Tetanus, diphtheria, and acellular pertussis (Tdap, Td) vaccine. You may need a Td booster every 10 years. Zoster vaccine. You may need  this after age 24. Pneumococcal 13-valent conjugate (PCV13) vaccine. One dose is recommended after age 79. Pneumococcal polysaccharide (PPSV23) vaccine. One dose is recommended after age 13. Talk to your health care provider about which screenings and vaccines you need and how often you need them. This information is not intended to replace advice given to you by your health care provider. Make sure you discuss any questions you have with your health care provider. Document Released: 01/04/2016 Document Revised: 08/27/2016 Document Reviewed: 10/09/2015 Elsevier Interactive Patient Education  2017 [employer].  Fall Prevention in the Home Falls can cause injuries. They can happen to people of all ages. There are many things you can do to make your home safe and to help prevent falls. What can I do on the outside of my home? Regularly fix the edges of walkways and driveways and fix any cracks. Remove anything that might make you trip as you walk through a door, such as a raised step or threshold. Trim any bushes or trees on the path to your home. Use bright outdoor lighting. Clear any walking paths of anything that might make someone trip, such as rocks or tools. Regularly check to see if handrails are loose or broken. Make sure that both sides of any steps have handrails. Any raised decks and porches should have guardrails on the edges. Have any leaves, snow, or ice cleared regularly. Use sand or salt on walking paths during winter. Clean up any spills in your garage right away. This includes oil or grease spills. What can I do in the bathroom? Use night lights. Install grab bars by the toilet and in the tub and shower. Do not use towel bars as grab bars. Use non-skid mats or decals in the tub or shower. If you need to sit down in the shower, use a plastic, non-slip stool. Keep the floor dry. Clean up any water that spills on the floor as soon as it happens. Remove soap buildup in the tub  or shower regularly. Attach bath mats securely with double-sided non-slip rug tape. Do not have throw rugs and other things on the floor that can make you trip. What can I do in the bedroom? Use night lights. Make sure that you have a light by your bed that is easy to reach. Do not use any sheets or blankets that are too big for your bed. They should not hang down onto the floor. Have a firm chair that has side arms. You can use this for support while you get dressed. Do not have throw rugs and other things on the floor that can make you trip. What can I do in the kitchen? Clean up any spills right away. Avoid walking on wet floors. Keep items that you use a lot in easy-to-reach places. If you need to reach something above you, use a strong step stool that has a grab bar. Keep electrical cords out of the way. Do not use floor polish or wax that makes floors slippery. If you must use wax, use non-skid floor wax. Do not have throw rugs and other things on the floor that can make you trip. What can I do with  my stairs? Do not leave any items on the stairs. Make sure that there are handrails on both sides of the stairs and use them. Fix handrails that are broken or loose. Make sure that handrails are as long as the stairways. Check any carpeting to make sure that it is firmly attached to the stairs. Fix any carpet that is loose or worn. Avoid having throw rugs at the top or bottom of the stairs. If you do have throw rugs, attach them to the floor with carpet tape. Make sure that you have a light switch at the top of the stairs and the bottom of the stairs. If you do not have them, ask someone to add them for you. What else can I do to help prevent falls? Wear shoes that: Do not have high heels. Have rubber bottoms. Are comfortable and fit you well. Are closed at the toe. Do not wear sandals. If you use a stepladder: Make sure that it is fully opened. Do not climb a closed stepladder. Make  sure that both sides of the stepladder are locked into place. Ask someone to hold it for you, if possible. Clearly mark and make sure that you can see: Any grab bars or handrails. First and last steps. Where the edge of each step is. Use tools that help you move around (mobility aids) if they are needed. These include: Canes. Walkers. Scooters. Crutches. Turn on the lights when you go into a dark area. Replace any light bulbs as soon as they burn out. Set up your furniture so you have a clear path. Avoid moving your furniture around. If any of your floors are uneven, fix them. If there are any pets around you, be aware of where they are. Review your medicines with your doctor. Some medicines can make you feel dizzy. This can increase your chance of falling. Ask your doctor what other things that you can do to help prevent falls. This information is not intended to replace advice given to you by your health care provider. Make sure you discuss any questions you have with your health care provider. Document Released: 10/04/2009 Document Revised: 05/15/2016 Document Reviewed: 01/12/2015 Elsevier Interactive Patient Education  2017 [employer].

## 2023-07-31 NOTE — Progress Notes (Signed)
Subjective:   Cynthia Burns is a 77 y.o. female who presents for Medicare Annual (Subsequent) preventive examination.  Visit Complete: Virtual  I connected with  [name] on 07/31/23 by a audio enabled telemedicine application and verified that I am speaking with the correct person using two identifiers.  Patient Location: Home  Provider Location: Office/Clinic  I discussed the limitations of evaluation and management by telemedicine. The patient expressed understanding and agreed to proceed.  Patient Medicare AWV questionnaire was completed by the patient on 07/24/23; I have confirmed that all information answered by patient is correct and no changes since this date.  Review of Systems     Cardiac Risk Factors include: advanced age (>29men, >40 women);diabetes mellitus;dyslipidemia;hypertension     Objective:   Vital Signs: Vital signs are patient reported.  Today's Vitals   07/31/23 1432  Weight: 113 lb (51.3 kg)  Height: 5' 1.5" (1.562 m)   Body mass index is 21.01 kg/m.     07/31/2023    2:27 PM 07/28/2022   11:12 AM 07/23/2021    3:07 PM 07/09/2020   11:04 AM 06/23/2019    8:15 AM 05/25/2018    1:35 PM 09/25/2016    9:11 AM  Advanced Directives  Does Patient Have a Medical Advance Directive? Yes Yes Yes Yes Yes Yes Yes  Type of Estate agent of [place];Living will Healthcare Power of [place];Living will Healthcare Power of [place];Living will Healthcare Power of [place];Living will Healthcare Power of [place];Living will Healthcare Power of [place];Living will Healthcare Power of [place];Living will  Does patient want to make changes to medical advance directive? No - Patient declined No - Patient declined  No - Patient declined No - Patient declined  No - Patient declined  Copy of Healthcare Power of Attorney in Chart? No - copy requested No - copy requested No - copy requested No - copy requested No - copy requested No - copy requested No -  copy requested    Current Medications (verified) Outpatient Encounter Medications as of 07/31/2023  Medication Sig   amoxicillin (AMOXIL) 500 MG capsule Take 500 mg by mouth. When going to the dentist   aspirin EC 81 MG tablet Take 1 tablet (81 mg total) by mouth daily.   BIOTIN PO Take by mouth.   Blood Glucose Monitoring Suppl (ONE TOUCH ULTRA 2) w/Device KIT CHECK BLOOD SUGAR TWICE DAILY.  DX CODE  E11.9   CALCIUM PO Take by mouth. 1-2 times a week   CRESTOR 20 MG tablet TAKE ONE TABLET BY MOUTH EVERY DAY   Cyanocobalamin (VITAMIN B-12 PO) Take 1 tablet by mouth daily.    fexofenadine (ALLEGRA) 180 MG tablet Take 180 mg by mouth daily.   furosemide (LASIX) 20 MG tablet TAKE ONE-HALF TABLET BY MOUTH EVERY DAY   glucose blood (ONETOUCH ULTRA) test strip Check blood sugars twice daily   Lancets (ONETOUCH DELICA PLUS LANCET33G) MISC USE 1 LANCET TO TEST AS DIRECTED (DISCARD LANCET IN SHARPS CONTAINER IMMEDIATELY AFTER USE)   levothyroxine (SYNTHROID) 25 MCG tablet TAKE ONE TABLET BY MOUTH EVERY DAY BEFORE BREAKFAST   losartan (COZAAR) 50 MG tablet Take 1 tablet (50 mg total) by mouth daily. TAKE ONE TABLET BY MOUTH EVERY DAY   magnesium oxide (MAG-OX) 400 MG tablet Take 400 mg by mouth daily.   metFORMIN (GLUCOPHAGE) 850 MG tablet TAKE ONE TABLET BY MOUTH THREE TIMES A DAY WITH FOOD   Multiple Vitamin (MULTIVITAMIN PO) Take by mouth. With iron  Multiple Vitamins-Minerals (PRESERVISION AREDS 2) CAPS Take by mouth.   nebivolol (BYSTOLIC) 5 MG tablet TAKE ONE TABLET BY MOUTH EVERY DAY   Semaglutide (RYBELSUS) 7 MG TABS Take 1 tablet (7 mg total) by mouth daily.   VITAMIN D, CHOLECALCIFEROL, PO Take by mouth daily. D3   ZINC-VITAMIN C PO Take 500 mg by mouth daily.   No facility-administered encounter medications on file as of 07/31/2023.    Allergies (verified) Mucinex [guaifenesin er], Codeine, Paxlovid [nirmatrelvir-ritonavir], Advicor [niacin-lovastatin er], Amlodipine,  Lisinopril-hydrochlorothiazide, Other, Ramipril, and Sulfonamide derivatives   History: Past Medical History:  Diagnosis Date   Abnormal Pap smear of cervix    pt not 100 percent sure but believes she did   Allergy    Anemia    Diabetes mellitus without complication (HCC)    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   HSV-1 infection    HYPERTENSION    Hypothyroid    MITRAL VALVE PROLAPSE    OSTEOPENIA    Overweight(278.02)    PHARYNGITIS, ACUTE    Past Surgical History:  Procedure Laterality Date   ABDOMINAL HYSTERECTOMY     CHOLECYSTECTOMY     WISDOM TOOTH EXTRACTION     Family History  Problem Relation Age of Onset   Hypertension Mother    Heart disease Mother        Had a stent   Emphysema Father    Diabetes Father    COPD Father    Stroke Sister    Breast cancer Neg Hx    Social History   Socioeconomic History   Marital status: Married    Spouse name: Not on file   Number of children: Not on file   Years of education: Not on file   Highest education level: Not on file  Occupational History   Occupation: hairdresser  Tobacco Use   Smoking status: Never   Smokeless tobacco: Never  Vaping Use   Vaping status: Never Used  Substance and Sexual Activity   Alcohol use: No    Alcohol/week: 0.0 standard drinks of alcohol   Drug use: No   Sexual activity: Not Currently    Partners: Male    Birth control/protection: Surgical, Abstinence    Comment: hysterectomy, 17, less than 5  Other Topics Concern   Not on file  Social History Narrative   Not on file   Social Determinants of Health   Financial Resource Strain: Low Risk  (07/24/2023)   Overall Financial Resource Strain (CARDIA)    Difficulty of Paying Living Expenses: Not hard at all  Food Insecurity: No Food Insecurity (07/24/2023)   Hunger Vital Sign    Worried About Running Out of Food in the Last Year: Never true    Ran Out of Food in the Last Year: Never true  Transportation Needs: No Transportation  Needs (07/24/2023)   PRAPARE - Administrator, Civil Service (Medical): No    Lack of Transportation (Non-Medical): No  Physical Activity: Inactive (07/24/2023)   Exercise Vital Sign    Days of Exercise per Week: 0 days    Minutes of Exercise per Session: 0 min  Stress: No Stress Concern Present (07/24/2023)   [employer] of Occupational Health - Occupational Stress Questionnaire    Feeling of Stress : Not at all  Social Connections: Unknown (07/24/2023)   Social Connection and Isolation Panel [NHANES]    Frequency of Communication with Friends and Family: More than three times a week  Frequency of Social Gatherings with Friends and Family: More than three times a week    Attends Religious Services: Not on file    Active Member of Clubs or Organizations: Yes    Attends Engineer, structural: More than 4 times per year    Marital Status: Married    Tobacco Counseling Counseling given: Not Answered   Clinical Intake:  Pre-visit preparation completed: Yes  Pain : No/denies pain  BMI - recorded: 21.01 Nutritional Risks: None Diabetes: Yes CBG done?: No Did pt. bring in CBG monitor from home?: No  How often do you need to have someone help you when you read instructions, pamphlets, or other written materials from your doctor or pharmacy?: 1 - Never  Interpreter Needed?: No  Information entered by :: Donne Anon, CMA   Activities of Daily Living    07/24/2023    3:43 PM  In your present state of health, do you have any difficulty performing the following activities:  Hearing? 1  Comment wears hearing aids  Vision? 0  Difficulty concentrating or making decisions? 0  Walking or climbing stairs? 0  Dressing or bathing? 0  Doing errands, shopping? 0  Preparing Food and eating ? N  Using the Toilet? N  In the past six months, have you accidently leaked urine? N  Do you have problems with loss of bowel control? N  Managing your Medications? N   Managing your Finances? N  Housekeeping or managing your Housekeeping? N    Patient Care Team: Zola Button, Grayling Congress, DO as PCP - General Wendall Stade, MD as PCP - Cardiology (Cardiology) Mckinley Jewel, MD as Consulting Physician (Ophthalmology) Hart Carwin, MD (Inactive) (Gastroenterology) Kathryne Hitch, MD as Consulting Physician (Orthopedic Surgery) Cherlyn Roberts, MD as Referring Physician (Dermatology) Genia Del, MD as Consulting Physician (Obstetrics and Gynecology)  Indicate any recent Medical Services you may have received from other than [facility] providers in the past year (date may be approximate).     Assessment:   This is a routine wellness examination for Sheenia.  Hearing/Vision screen No results found.  Dietary issues and exercise activities discussed:     Goals Addressed   None    Depression Screen    07/31/2023    2:29 PM 07/28/2022   11:13 AM 07/26/2021    8:07 AM 07/23/2021    3:14 PM 07/06/2020    8:39 AM 06/23/2019    8:17 AM 05/25/2018    1:35 PM  PHQ 2/9 Scores  PHQ - 2 Score 0 0 0 0 0 0 0    Fall Risk    07/24/2023    3:43 PM 02/19/2023   11:26 AM 07/28/2022   11:13 AM 07/23/2021    3:11 PM 07/09/2020   11:07 AM  Fall Risk   Falls in the past year? 0 0 0 0 0  Number falls in past yr: 0 0 0 0 0  Injury with Fall? 0 0 0 0 0  Risk for fall due to : No Fall Risks No Fall Risks No Fall Risks    Follow up Falls evaluation completed Falls evaluation completed Falls evaluation completed Falls prevention discussed Education provided;Falls prevention discussed    MEDICARE RISK AT HOME:   TIMED UP AND GO:  Was the test performed?  No    Cognitive Function:    09/25/2016    9:12 AM  MMSE - Mini Mental State Exam  Orientation to time 5  Orientation to Place  5  Registration 3  Attention/ Calculation 5  Recall 3  Language- name 2 objects 2  Language- repeat 1  Language- follow 3 step command 3  Language- read & follow  direction 1  Write a sentence 1  Copy design 1  Total score 30        07/31/2023    2:33 PM 07/28/2022   11:25 AM  6CIT Screen  What Year? 0 points 0 points  What month? 0 points 0 points  What time? 0 points 0 points  Count back from 20 0 points 0 points  Months in reverse 0 points 0 points  Repeat phrase 0 points 0 points  Total Score 0 points 0 points    Immunizations Immunization History  Administered Date(s) Administered   Fluad Quad(high Dose 65+) 08/18/2019   Influenza Whole 10/20/2007   Influenza, High Dose Seasonal PF 09/01/2016, 09/18/2018, 08/30/2020   Influenza,inj,Quad PF,6+ Mos 09/05/2013, 09/05/2014, 09/13/2015   Influenza-Unspecified 09/21/2017, 09/23/2021, 09/25/2022   PFIZER(Purple Top)SARS-COV-2 Vaccination 02/13/2020, 03/05/2020, 11/06/2020   Pneumococcal Conjugate-13 02/27/2014   Pneumococcal Polysaccharide-23 09/06/2011, 07/26/2019, 07/25/2020   Td 07/26/2019   Zoster Recombinant(Shingrix) 04/12/2017, 06/13/2017   Zoster, Live 09/14/2013    TDAP status: Up to date  Flu Vaccine status: Due, Education has been provided regarding the importance of this vaccine. Advised may receive this vaccine at local pharmacy or Health Dept. Aware to provide a copy of the vaccination record if obtained from local pharmacy or Health Dept. Verbalized acceptance and understanding.  Pneumococcal vaccine status: Up to date  Covid-19 vaccine status: Information provided on how to obtain vaccines.   Qualifies for Shingles Vaccine? Yes   Zostavax completed Yes   Shingrix Completed?: Yes  Screening Tests Health Maintenance  Topic Date Due   COVID-19 Vaccine (4 - 2023-24 season) 08/22/2022   Medicare Annual Wellness (AWV)  07/29/2023   INFLUENZA VACCINE  07/23/2023   Colonoscopy  11/09/2023   HEMOGLOBIN A1C  08/26/2023   MAMMOGRAM  10/23/2023   Diabetic kidney evaluation - eGFR measurement  02/23/2024   Diabetic kidney evaluation - Urine ACR  02/23/2024   FOOT EXAM   02/23/2024   OPHTHALMOLOGY EXAM  06/15/2024   DTaP/Tdap/Td (2 - Tdap) 07/25/2029   Pneumonia Vaccine 39+ Years old  Completed   DEXA SCAN  Completed   Hepatitis C Screening  Completed   Zoster Vaccines- Shingrix  Completed   HPV VACCINES  Aged Out    Health Maintenance  Health Maintenance Due  Topic Date Due   COVID-19 Vaccine (4 - 2023-24 season) 08/22/2022   Medicare Annual Wellness (AWV)  07/29/2023   INFLUENZA VACCINE  07/23/2023   Colonoscopy  11/09/2023    Colorectal cancer screening: Type of screening: Colonoscopy. Completed 11/08/13. Repeat every 10 years  Mammogram status: Completed 10/22/22. Repeat every year  Bone Density status: Completed 10/18/20. Results reflect: Bone density results: NORMAL. Repeat every 2 years.  Lung Cancer Screening: (Low Dose CT Chest recommended if Age 25-80 years, 20 pack-year currently smoking OR have quit w/in 15years.) does not qualify.   Additional Screening:  Hepatitis C Screening: does qualify; Completed 09/13/15  Vision Screening: Recommended annual ophthalmology exams for early detection of glaucoma and other disorders of the eye. Is the patient up to date with their annual eye exam?  Yes  Who is the provider or what is the name of the office in which the patient attends annual eye exams? [facility]. If pt is not established with a provider, would they  like to be referred to a provider to establish care? No .   Dental Screening: Recommended annual dental exams for proper oral hygiene  Diabetic Foot Exam: Diabetic Foot Exam: Completed 02/23/23  Community Resource Referral / Chronic Care Management: CRR required this visit?  No   CCM required this visit?  No     Plan:     I have personally reviewed and noted the following in the patient's chart:   Medical and social history Use of alcohol, tobacco or illicit drugs  Current medications and supplements including opioid prescriptions. Patient is not  currently taking opioid prescriptions. Functional ability and status Nutritional status Physical activity Advanced directives List of other physicians Hospitalizations, surgeries, and ER visits in previous 12 months Vitals Screenings to include cognitive, depression, and falls Referrals and appointments  In addition, I have reviewed and discussed with patient certain preventive protocols, quality metrics, and best practice recommendations. A written personalized care plan for preventive services as well as general preventive health recommendations were provided to patient.     Donne Anon, CMA   07/31/2023   After Visit Summary: (MyChart) Due to this being a telephonic visit, the after visit summary with patients personalized plan was offered to patient via MyChart   Nurse Notes: None

## 2023-08-06 ENCOUNTER — Encounter (INDEPENDENT_AMBULATORY_CARE_PROVIDER_SITE_OTHER): Payer: Self-pay

## 2023-08-27 ENCOUNTER — Ambulatory Visit (INDEPENDENT_AMBULATORY_CARE_PROVIDER_SITE_OTHER): Payer: Medicare Other | Admitting: Family Medicine

## 2023-08-27 ENCOUNTER — Encounter: Payer: Self-pay | Admitting: Family Medicine

## 2023-08-27 VITALS — BP 120/78 | HR 65 | Temp 97.8°F | Resp 18 | Ht 61.5 in | Wt 114.8 lb

## 2023-08-27 DIAGNOSIS — E785 Hyperlipidemia, unspecified: Secondary | ICD-10-CM | POA: Diagnosis not present

## 2023-08-27 DIAGNOSIS — E1169 Type 2 diabetes mellitus with other specified complication: Secondary | ICD-10-CM | POA: Diagnosis not present

## 2023-08-27 DIAGNOSIS — E1165 Type 2 diabetes mellitus with hyperglycemia: Secondary | ICD-10-CM

## 2023-08-27 DIAGNOSIS — Z23 Encounter for immunization: Secondary | ICD-10-CM

## 2023-08-27 DIAGNOSIS — I10 Essential (primary) hypertension: Secondary | ICD-10-CM | POA: Diagnosis not present

## 2023-08-27 DIAGNOSIS — E039 Hypothyroidism, unspecified: Secondary | ICD-10-CM | POA: Diagnosis not present

## 2023-08-27 DIAGNOSIS — E559 Vitamin D deficiency, unspecified: Secondary | ICD-10-CM | POA: Diagnosis not present

## 2023-08-27 LAB — COMPREHENSIVE METABOLIC PANEL
ALT: 14 U/L (ref 0–35)
AST: 17 U/L (ref 0–37)
Albumin: 3.5 g/dL (ref 3.5–5.2)
Alkaline Phosphatase: 27 U/L — ABNORMAL LOW (ref 39–117)
BUN: 27 mg/dL — ABNORMAL HIGH (ref 6–23)
CO2: 25 meq/L (ref 19–32)
Calcium: 9.4 mg/dL (ref 8.4–10.5)
Chloride: 104 meq/L (ref 96–112)
Creatinine, Ser: 1.42 mg/dL — ABNORMAL HIGH (ref 0.40–1.20)
GFR: 36.14 mL/min — ABNORMAL LOW (ref >60.00)
Glucose, Bld: 82 mg/dL (ref 70–99)
Potassium: 4.2 meq/L (ref 3.5–5.1)
Sodium: 129 meq/L — ABNORMAL LOW (ref 135–145)
Total Bilirubin: 0.3 mg/dL (ref 0.2–1.2)
Total Protein: 13.5 g/dL — ABNORMAL HIGH (ref 6.0–8.3)

## 2023-08-27 LAB — TSH: TSH: 2.25 u[IU]/mL (ref 0.35–5.50)

## 2023-08-27 LAB — CBC WITH DIFFERENTIAL/PLATELET
Basophils Absolute: 0 10*3/uL (ref 0.0–0.1)
Basophils Relative: 0.5 % (ref 0.0–3.0)
Eosinophils Absolute: 0.1 10*3/uL (ref 0.0–0.7)
Eosinophils Relative: 1.2 % (ref 0.0–5.0)
HCT: 25.6 % — ABNORMAL LOW (ref 36.0–46.0)
Hemoglobin: 8.6 g/dL — ABNORMAL LOW (ref 12.0–15.0)
Lymphocytes Relative: 34.5 % (ref 12.0–46.0)
Lymphs Abs: 3 10*3/uL (ref 0.7–4.0)
MCHC: 33.5 g/dL (ref 30.0–36.0)
MCV: 101.1 fl — ABNORMAL HIGH (ref 78.0–100.0)
Monocytes Absolute: 0.8 10*3/uL (ref 0.1–1.0)
Monocytes Relative: 9.2 % (ref 3.0–12.0)
Neutro Abs: 4.7 10*3/uL (ref 1.4–7.7)
Neutrophils Relative %: 54.6 % (ref 43.0–77.0)
Platelets: 217 10*3/uL (ref 150.0–400.0)
RBC: 2.54 Mil/uL — ABNORMAL LOW (ref 3.87–5.11)
RDW: 14.4 % (ref 11.5–15.5)
WBC: 8.6 10*3/uL (ref 4.0–10.5)

## 2023-08-27 LAB — LIPID PANEL
Cholesterol: 89 mg/dL (ref 0–200)
HDL: 45.3 mg/dL (ref >39.00)
LDL Cholesterol: 21 mg/dL (ref 0–99)
NonHDL: 43.36
Total CHOL/HDL Ratio: 2
Triglycerides: 112 mg/dL (ref 0.0–149.0)
VLDL: 22.4 mg/dL (ref 0.0–40.0)

## 2023-08-27 LAB — MICROALBUMIN / CREATININE URINE RATIO
Creatinine,U: 64 mg/dL
Microalb Creat Ratio: 12.5 mg/g (ref 0.0–30.0)
Microalb, Ur: 8 mg/dL — ABNORMAL HIGH (ref 0.0–1.9)

## 2023-08-27 LAB — HEMOGLOBIN A1C: Hgb A1c MFr Bld: 6.3 % (ref 4.6–6.5)

## 2023-08-27 LAB — VITAMIN D 25 HYDROXY (VIT D DEFICIENCY, FRACTURES): VITD: 67.77 ng/mL (ref 30.00–100.00)

## 2023-08-27 NOTE — Assessment & Plan Note (Signed)
Check labs 

## 2023-08-27 NOTE — Assessment & Plan Note (Signed)
Tolerating statin, encouraged heart healthy diet, avoid trans fats, minimize simple carbs and saturated fats. Increase exercise as tolerated 

## 2023-08-27 NOTE — Progress Notes (Signed)
Established Patient Office Visit  Subjective   [name], female    DOB: Mar 13, 1946  Age: 77 y.o. MRN: [id number]  Chief Complaint  Patient presents with   Hypothyroidism   Diabetes   Follow-up    HPI Discussed the use of AI scribe software for clinical note transcription with the patient, who gave verbal consent to proceed.  History of Present Illness   The patient, with a history of thyroid disease and high cholesterol, presents for a routine check-up. She reports no new health issues. She recently got new hearing aids after her family noticed she had the TV volume too high and was not responding to sounds in her environment. She initially thought she was fine and wanted to prove her family wrong, but was told by the audiologist that she had a significant hearing problem. She now uses hearing aids, which she finds helpful, although she still struggles with background noise in crowded places. She also mentions that she has been prescribed thyroid medication, but it is unclear if she is experiencing any symptoms related to this condition.      Patient Active Problem List   Diagnosis Date Noted   Hypothyroidism 02/23/2023   Type 2 diabetes mellitus with hyperglycemia, without long-term current use of insulin (HCC) 07/26/2021   Impacted cerumen of right ear 07/06/2020   Type 2 diabetes mellitus with hyperglycemia (HCC) 04/11/2019   Hyperlipidemia LDL goal <70 03/23/2017   Abdominal pain, epigastric 09/03/2016   Strain of right biceps 03/17/2016   Right shoulder pain 03/17/2016   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   Plantar wart of right foot 03/15/2015   Uncontrolled type 2 diabetes mellitus with hyperglycemia (HCC) 09/16/2013   Elevated lipids 09/05/2013   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   OVERWEIGHT 02/07/2009   PHARYNGITIS, ACUTE 06/03/2007   Essential hypertension 05/18/2007   MITRAL VALVE PROLAPSE 05/18/2007   OSTEOPENIA 05/18/2007   Past Medical  History:  Diagnosis Date   Abnormal Pap smear of cervix    pt not 100 percent sure but believes she did   Allergy    Anemia    Diabetes mellitus without complication (HCC)    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   HSV-1 infection    HYPERTENSION    Hypothyroid    MITRAL VALVE PROLAPSE    OSTEOPENIA    Overweight(278.02)    PHARYNGITIS, ACUTE    Past Surgical History:  Procedure Laterality Date   ABDOMINAL HYSTERECTOMY     CHOLECYSTECTOMY     WISDOM TOOTH EXTRACTION     Social History   Tobacco Use   Smoking status: Never   Smokeless tobacco: Never  Vaping Use   Vaping status: Never Used  Substance Use Topics   Alcohol use: No    Alcohol/week: 0.0 standard drinks of alcohol   Drug use: No   Social History   Socioeconomic History   Marital status: Married    Spouse name: Not on file   Number of children: Not on file   Years of education: Not on file   Highest education level: Not on file  Occupational History   Occupation: hairdresser  Tobacco Use   Smoking status: Never   Smokeless tobacco: Never  Vaping Use   Vaping status: Never Used  Substance and Sexual Activity   Alcohol use: No    Alcohol/week: 0.0 standard drinks of alcohol   Drug use: No   Sexual activity: Not Currently    Partners: Male  Birth control/protection: Surgical, Abstinence    Comment: hysterectomy, 17, less than 5  Other Topics Concern   Not on file  Social History Narrative   Not on file   Social Determinants of Health   Financial Resource Strain: Low Risk  (07/24/2023)   Overall Financial Resource Strain (CARDIA)    Difficulty of Paying Living Expenses: Not hard at all  Food Insecurity: No Food Insecurity (07/24/2023)   Hunger Vital Sign    Worried About Running Out of Food in the Last Year: Never true    Ran Out of Food in the Last Year: Never true  Transportation Needs: No Transportation Needs (07/24/2023)   PRAPARE - Administrator, Civil Service (Medical): No     Lack of Transportation (Non-Medical): No  Physical Activity: Inactive (07/24/2023)   Exercise Vital Sign    Days of Exercise per Week: 0 days    Minutes of Exercise per Session: 0 min  Stress: No Stress Concern Present (07/24/2023)   [employer] of Occupational Health - Occupational Stress Questionnaire    Feeling of Stress : Not at all  Social Connections: Unknown (07/24/2023)   Social Connection and Isolation Panel [NHANES]    Frequency of Communication with Friends and Family: More than three times a week    Frequency of Social Gatherings with Friends and Family: More than three times a week    Attends Religious Services: Not on file    Active Member of Clubs or Organizations: Yes    Attends Banker Meetings: More than 4 times per year    Marital Status: Married  Catering manager Violence: Not At Risk (07/31/2023)   Humiliation, Afraid, Rape, and Kick questionnaire    Fear of Current or Ex-Partner: No    Emotionally Abused: No    Physically Abused: No    Sexually Abused: No   Family Status  Relation Name Status   Mother Naaman Plummer Deceased   Father Arnetha Gula Deceased   Sister Marlowe Alt (Not Specified)   MGM  Deceased   MGF  Deceased   PGM  Deceased   PGF  Deceased   Other  (Not Specified)   Neg Hx  (Not Specified)  No partnership data on file   Family History  Problem Relation Age of Onset   Hypertension Mother    Heart disease Mother        Had a stent   Emphysema Father    Diabetes Father    COPD Father    Stroke Sister    Breast cancer Neg Hx    Allergies  Allergen Reactions   Mucinex [Guaifenesin Er] Anaphylaxis   Codeine Nausea And Vomiting   Paxlovid [Nirmatrelvir-Ritonavir] Diarrhea and Nausea And Vomiting   Advicor [Niacin-Lovastatin Er] Rash   Amlodipine Rash   Lisinopril-Hydrochlorothiazide Rash   Other Rash    Eye drops with preservatives.     Ramipril Rash   Sulfonamide Derivatives Rash      Review of Systems   Constitutional:  Negative for fever and malaise/fatigue.  HENT:  Negative for congestion.   Eyes:  Negative for blurred vision.  Respiratory:  Negative for shortness of breath.   Cardiovascular:  Negative for chest pain, palpitations and leg swelling.  Gastrointestinal:  Negative for abdominal pain, blood in stool and nausea.  Genitourinary:  Negative for dysuria and frequency.  Musculoskeletal:  Negative for falls.  Skin:  Negative for rash.  Neurological:  Negative for dizziness, loss of consciousness and headaches.  Endo/Heme/Allergies:  Negative for environmental allergies.  Psychiatric/Behavioral:  Negative for depression. The patient is not nervous/anxious.       Objective:     BP 120/78 (BP Location: Left Arm, Patient Position: Sitting, Cuff Size: Normal)   Pulse 65   Temp 97.8 F (36.6 C) (Oral)   Resp 18   Ht 5' 1.5" (1.562 m)   Wt 114 lb 12.8 oz (52.1 kg)   SpO2 97%   BMI 21.34 kg/m  BP Readings from Last 3 Encounters:  08/27/23 120/78  03/16/23 (!) 158/80  02/23/23 138/88   Wt Readings from Last 3 Encounters:  08/27/23 114 lb 12.8 oz (52.1 kg)  07/31/23 113 lb (51.3 kg)  03/16/23 116 lb (52.6 kg)   SpO2 Readings from Last 3 Encounters:  08/27/23 97%  03/16/23 99%  02/23/23 97%      Physical Exam Vitals and nursing note reviewed.  Constitutional:      General: She is not in acute distress.    Appearance: Normal appearance. She is well-developed.  HENT:     Head: Normocephalic and atraumatic.     Right Ear: Tympanic membrane, ear canal and external ear normal. There is no impacted cerumen.     Left Ear: Tympanic membrane, ear canal and external ear normal. There is no impacted cerumen.     Nose: Nose normal.     Mouth/Throat:     Mouth: Mucous membranes are moist.     Pharynx: Oropharynx is clear. No oropharyngeal exudate or posterior oropharyngeal erythema.  Eyes:     General: No scleral icterus.       Right eye: No discharge.        Left eye:  No discharge.     Conjunctiva/sclera: Conjunctivae normal.     Pupils: Pupils are equal, round, and reactive to light.  Neck:     Thyroid: No thyromegaly or thyroid tenderness.     Vascular: No JVD.  Cardiovascular:     Rate and Rhythm: Normal rate and regular rhythm.     Heart sounds: Normal heart sounds. No murmur heard. Pulmonary:     Effort: Pulmonary effort is normal. No respiratory distress.     Breath sounds: Normal breath sounds.  Abdominal:     General: Bowel sounds are normal. There is no distension.     Palpations: Abdomen is soft. There is no mass.     Tenderness: There is no abdominal tenderness. There is no guarding or rebound.  Genitourinary:    Vagina: Normal.  Musculoskeletal:        General: Normal range of motion.     Cervical back: Normal range of motion and neck supple.     Right lower leg: No edema.     Left lower leg: No edema.  Lymphadenopathy:     Cervical: No cervical adenopathy.  Skin:    General: Skin is warm and dry.     Findings: No erythema or rash.  Neurological:     Mental Status: She is alert and oriented to person, place, and time.     Cranial Nerves: No cranial nerve deficit.     Deep Tendon Reflexes: Reflexes are normal and symmetric.  Psychiatric:        Mood and Affect: Mood normal.        Behavior: Behavior normal.        Thought Content: Thought content normal.        Judgment: Judgment normal.      No results found  for any visits on 08/27/23.  Last CBC Lab Results  Component Value Date   WBC 10.1 02/23/2023   HGB 10.9 (L) 02/23/2023   HCT 31.9 (L) 02/23/2023   MCV 95.7 02/23/2023   RDW 14.0 02/23/2023   PLT 310.0 02/23/2023   Last metabolic panel Lab Results  Component Value Date   GLUCOSE 85 02/23/2023   NA 132 (L) 02/23/2023   K 3.9 02/23/2023   CL 100 02/23/2023   CO2 27 02/23/2023   BUN 19 02/23/2023   CREATININE 1.00 02/23/2023   GFR 55.24 (L) 02/23/2023   CALCIUM 9.2 02/23/2023   PROT 10.6 (H) 02/23/2023    ALBUMIN 3.7 02/23/2023   BILITOT 0.3 02/23/2023   ALKPHOS 36 (L) 02/23/2023   AST 25 02/23/2023   ALT 22 02/23/2023   Last lipids Lab Results  Component Value Date   CHOL 97 02/23/2023   HDL 46.60 02/23/2023   LDLCALC 29 02/23/2023   LDLDIRECT 53.0 05/25/2018   TRIG 107.0 02/23/2023   CHOLHDL 2 02/23/2023   Last hemoglobin A1c Lab Results  Component Value Date   HGBA1C 6.3 02/23/2023   Last thyroid functions Lab Results  Component Value Date   TSH 2.11 02/23/2023   T4TOTAL 6.6 02/24/2022   Last vitamin D Lab Results  Component Value Date   VD25OH 52.56 02/06/2022   Last vitamin B12 and Folate No results found for: "VITAMINB12", "FOLATE"    The ASCVD Risk score (Arnett DK, et al., 2019) failed to calculate for the following reasons:   The valid total cholesterol range is 130 to 320 mg/dL    Assessment & Plan:   Problem List Items Addressed This Visit       Unprioritized   Type 2 diabetes mellitus with hyperglycemia, without long-term current use of insulin (HCC)   Relevant Orders   Hemoglobin A1c   Essential hypertension   Relevant Orders   CBC with Differential/Platelet   Comprehensive metabolic panel   Uncontrolled type 2 diabetes mellitus with hyperglycemia (HCC)    Tolerating statin, encouraged heart healthy diet, avoid trans fats, minimize simple carbs and saturated fats. Increase exercise as tolerated       Relevant Orders   Hemoglobin A1c   Microalbumin / creatinine urine ratio   Hypothyroidism - Primary    Check labs       Relevant Orders   TSH   Hyperlipidemia LDL goal <70    Tolerating statin, encouraged heart healthy diet, avoid trans fats, minimize simple carbs and saturated fats. Increase exercise as tolerated       Other Visit Diagnoses     Hyperlipidemia associated with type 2 diabetes mellitus (HCC)       Relevant Orders   Lipid panel   Vitamin D deficiency       Relevant Orders   VITAMIN D 25 Hydroxy (Vit-D Deficiency,  Fractures)   Need for influenza vaccination       Relevant Orders   Flu Vaccine Trivalent High Dose (Fluad) (Completed)   Need for pneumococcal 20-valent conjugate vaccination       Relevant Orders   Pneumococcal conjugate vaccine 20-valent (Prevnar 20) (Completed)     Assessment and Plan    Hypertension Well controlled on Losartan 50mg  and Bystolic. -Continue current medications.  Hyperlipidemia Managed by Dr. Corrin Parker with Crestor. -Continue current medication.  Hearing Loss Recently fitted with hearing aids. -Follow-up appointment in March.  Thyroid Disease Eye doctor requested records regarding thyroid disease due to a potential issue identified  during an eye exam. -Fax labs from March and current labs to eye doctor.  General Health Maintenance -Administer high-dose flu vaccine and pneumonia vaccine today. -Continue Lasix as prescribed by Dr. Corrin Parker.        No follow-ups on file.    Donato Schultz, DO

## 2023-08-30 ENCOUNTER — Other Ambulatory Visit: Payer: Self-pay | Admitting: Family Medicine

## 2023-08-30 DIAGNOSIS — N1832 Chronic kidney disease, stage 3b: Secondary | ICD-10-CM

## 2023-09-01 ENCOUNTER — Telehealth: Payer: Self-pay | Admitting: *Deleted

## 2023-09-01 ENCOUNTER — Other Ambulatory Visit: Payer: Self-pay | Admitting: Family Medicine

## 2023-09-01 ENCOUNTER — Other Ambulatory Visit (INDEPENDENT_AMBULATORY_CARE_PROVIDER_SITE_OTHER): Payer: Medicare Other

## 2023-09-01 DIAGNOSIS — N1832 Chronic kidney disease, stage 3b: Secondary | ICD-10-CM

## 2023-09-01 LAB — COMPREHENSIVE METABOLIC PANEL
ALT: 14 U/L (ref 0–35)
AST: 16 U/L (ref 0–37)
Albumin: 3.3 g/dL — ABNORMAL LOW (ref 3.5–5.2)
Alkaline Phosphatase: 30 U/L — ABNORMAL LOW (ref 39–117)
BUN: 21 mg/dL (ref 6–23)
CO2: 26 meq/L (ref 19–32)
Calcium: 8.9 mg/dL (ref 8.4–10.5)
Chloride: 104 meq/L (ref 96–112)
Creatinine, Ser: 1.27 mg/dL — ABNORMAL HIGH (ref 0.40–1.20)
GFR: 41.31 mL/min — ABNORMAL LOW (ref >60.00)
Glucose, Bld: 91 mg/dL (ref 70–99)
Potassium: 3.8 meq/L (ref 3.5–5.1)
Sodium: 131 meq/L — ABNORMAL LOW (ref 135–145)
Total Bilirubin: 0.3 mg/dL (ref 0.2–1.2)
Total Protein: 13.2 g/dL — ABNORMAL HIGH (ref 6.0–8.3)

## 2023-09-01 LAB — CBC WITH DIFFERENTIAL/PLATELET
Basophils Absolute: 0.1 10*3/uL (ref 0.0–0.1)
Basophils Relative: 1.1 % (ref 0.0–3.0)
Eosinophils Absolute: 0.1 10*3/uL (ref 0.0–0.7)
Eosinophils Relative: 1.4 % (ref 0.0–5.0)
HCT: 24.5 % — ABNORMAL LOW (ref 36.0–46.0)
Hemoglobin: 8.2 g/dL — ABNORMAL LOW (ref 12.0–15.0)
Lymphocytes Relative: 35.8 % (ref 12.0–46.0)
Lymphs Abs: 2.9 10*3/uL (ref 0.7–4.0)
MCHC: 33.2 g/dL (ref 30.0–36.0)
MCV: 100.2 fl — ABNORMAL HIGH (ref 78.0–100.0)
Monocytes Absolute: 0.7 10*3/uL (ref 0.1–1.0)
Monocytes Relative: 8.9 % (ref 3.0–12.0)
Neutro Abs: 4.3 10*3/uL (ref 1.4–7.7)
Neutrophils Relative %: 52.8 % (ref 43.0–77.0)
Platelets: 215 10*3/uL (ref 150.0–400.0)
RBC: 2.45 Mil/uL — ABNORMAL LOW (ref 3.87–5.11)
RDW: 14.3 % (ref 11.5–15.5)
WBC: 8.1 10*3/uL (ref 4.0–10.5)

## 2023-09-01 NOTE — Telephone Encounter (Signed)
Pt came in for repeat labs this morning.  She had questions about recent results. Questions answered.  Pt was anxious and unsure how serious her kidney function results were. Advised pt per recent result note they are repeating today to make sure hydration was not an issue and if results continue to be abnormal she would be referred to nephrologist for further workup and recommendations.  As of this time results were not critical.  Pt states she drinks 3-4, 16 oz bottles of water per day and sometimes more.  Pt states she did increase her water intake after recent results.  Pt also requests that she be called with any abnormal results in the future as she had questions / concerns to discuss and does not know how to send messages via mychart.  Pt is also asking for referral to urology or urogyn (whichever she can see sooner) for possible prolapsed bladder.  Pt saw Dr Seymour Bars (gyn) for symptoms initially but was told that she did not find anything abnormal at that time.  Since then, pt states she notices symptoms worsen after being active during the day ("feels like there is a knot/cyst in her vagina") but sensation goes away after sleeping at night.

## 2023-09-02 ENCOUNTER — Other Ambulatory Visit: Payer: Self-pay

## 2023-09-02 ENCOUNTER — Telehealth: Payer: Self-pay | Admitting: Family Medicine

## 2023-09-02 ENCOUNTER — Other Ambulatory Visit: Payer: Self-pay | Admitting: Family Medicine

## 2023-09-02 DIAGNOSIS — Z1211 Encounter for screening for malignant neoplasm of colon: Secondary | ICD-10-CM

## 2023-09-02 DIAGNOSIS — D509 Iron deficiency anemia, unspecified: Secondary | ICD-10-CM

## 2023-09-02 NOTE — Telephone Encounter (Signed)
Pls advise pt via phone call about iron dosage.

## 2023-09-02 NOTE — Telephone Encounter (Addendum)
Spoke with patient. Pt states not having pain or discharge. Pt states having a knot or a bump in her vagina after daily activities but wakes in the morning and doesn't feel anything. Pt would also like to know how much iron should she be taking.

## 2023-09-02 NOTE — Telephone Encounter (Signed)
Patient called to see if someone could make her lab results make sense and go over them in more detail. Pt would like to know details. Please call her back to advise.

## 2023-09-02 NOTE — Telephone Encounter (Signed)
Spoke with patient. Pt verbalized understanding. Lab appt made. IFOB placed at the front

## 2023-09-03 ENCOUNTER — Other Ambulatory Visit (INDEPENDENT_AMBULATORY_CARE_PROVIDER_SITE_OTHER): Payer: Medicare Other

## 2023-09-03 DIAGNOSIS — D649 Anemia, unspecified: Secondary | ICD-10-CM

## 2023-09-03 LAB — FECAL OCCULT BLOOD, IMMUNOCHEMICAL: Fecal Occult Bld: NEGATIVE

## 2023-09-03 NOTE — Progress Notes (Signed)
Specimen drop off

## 2023-09-04 ENCOUNTER — Encounter: Payer: Self-pay | Admitting: Family Medicine

## 2023-09-07 ENCOUNTER — Other Ambulatory Visit: Payer: Self-pay | Admitting: Cardiovascular Disease

## 2023-09-07 ENCOUNTER — Other Ambulatory Visit: Payer: Self-pay | Admitting: Family Medicine

## 2023-09-07 ENCOUNTER — Encounter: Payer: Self-pay | Admitting: Gastroenterology

## 2023-09-07 DIAGNOSIS — E1165 Type 2 diabetes mellitus with hyperglycemia: Secondary | ICD-10-CM

## 2023-09-08 ENCOUNTER — Other Ambulatory Visit: Payer: Self-pay

## 2023-09-08 ENCOUNTER — Telehealth: Payer: Self-pay | Admitting: Neurology

## 2023-09-08 ENCOUNTER — Other Ambulatory Visit: Payer: Self-pay | Admitting: Family Medicine

## 2023-09-08 ENCOUNTER — Other Ambulatory Visit (INDEPENDENT_AMBULATORY_CARE_PROVIDER_SITE_OTHER): Payer: Medicare Other

## 2023-09-08 DIAGNOSIS — E039 Hypothyroidism, unspecified: Secondary | ICD-10-CM

## 2023-09-08 DIAGNOSIS — Z1231 Encounter for screening mammogram for malignant neoplasm of breast: Secondary | ICD-10-CM

## 2023-09-08 DIAGNOSIS — D509 Iron deficiency anemia, unspecified: Secondary | ICD-10-CM

## 2023-09-08 DIAGNOSIS — E1165 Type 2 diabetes mellitus with hyperglycemia: Secondary | ICD-10-CM

## 2023-09-08 LAB — CBC WITH DIFFERENTIAL/PLATELET
Basophils Absolute: 0.1 10*3/uL (ref 0.0–0.1)
Basophils Relative: 0.7 % (ref 0.0–3.0)
Eosinophils Absolute: 0.1 10*3/uL (ref 0.0–0.7)
Eosinophils Relative: 1.2 % (ref 0.0–5.0)
HCT: 22.8 % — CL (ref 36.0–46.0)
Hemoglobin: 7.6 g/dL — CL (ref 12.0–15.0)
Lymphocytes Relative: 33.4 % (ref 12.0–46.0)
Lymphs Abs: 3.2 10*3/uL (ref 0.7–4.0)
MCHC: 33.4 g/dL (ref 30.0–36.0)
MCV: 100.4 fl — ABNORMAL HIGH (ref 78.0–100.0)
Monocytes Absolute: 0.8 10*3/uL (ref 0.1–1.0)
Monocytes Relative: 8.5 % (ref 3.0–12.0)
Neutro Abs: 5.4 10*3/uL (ref 1.4–7.7)
Neutrophils Relative %: 56.2 % (ref 43.0–77.0)
Platelets: 222 10*3/uL (ref 150.0–400.0)
RBC: 2.27 Mil/uL — ABNORMAL LOW (ref 3.87–5.11)
RDW: 14.6 % (ref 11.5–15.5)
WBC: 9.6 10*3/uL (ref 4.0–10.5)

## 2023-09-08 LAB — IBC PANEL
Iron: 66 ug/dL (ref 42–145)
Saturation Ratios: 25.9 % (ref 20.0–50.0)
TIBC: 254.8 ug/dL (ref 250.0–450.0)
Transferrin: 182 mg/dL — ABNORMAL LOW (ref 212.0–360.0)

## 2023-09-08 MED ORDER — ONETOUCH ULTRA TEST VI STRP
ORAL_STRIP | 3 refills | Status: DC
Start: 2023-09-08 — End: 2024-09-27

## 2023-09-08 MED ORDER — ONETOUCH DELICA PLUS LANCET33G MISC
1 refills | Status: DC
Start: 2023-09-08 — End: 2024-09-27

## 2023-09-08 MED ORDER — RYBELSUS 7 MG PO TABS
1.0000 | ORAL_TABLET | Freq: Every day | ORAL | 1 refills | Status: DC
Start: 2023-09-08 — End: 2024-02-09

## 2023-09-08 MED ORDER — LEVOTHYROXINE SODIUM 25 MCG PO TABS
ORAL_TABLET | ORAL | 1 refills | Status: DC
Start: 2023-09-08 — End: 2024-04-13

## 2023-09-08 MED ORDER — METFORMIN HCL 850 MG PO TABS
ORAL_TABLET | ORAL | 1 refills | Status: DC
Start: 2023-09-08 — End: 2023-12-15

## 2023-09-08 NOTE — Telephone Encounter (Signed)
Pt called. LDVM

## 2023-09-08 NOTE — Telephone Encounter (Signed)
Are we able to reach out to her to see how she is doing? If SOB, lightheaded or dizzy, go to ER. If she is fine, I would like to add a B12 and folate to her sample from today.

## 2023-09-08 NOTE — Telephone Encounter (Signed)
CRITICAL VALUE STICKER  CRITICAL VALUE: Hemoglobin 7.6, Hematocrit 22.8   RECEIVER (on-site recipient of call): Cynthia Burns  DATE & TIME NOTIFIED: 09/08/2023 1:10 pm  MESSENGER (representative from lab): Clydie Braun  MD NOTIFIED: [facility] patient)  TIME OF NOTIFICATION: 1:110 pm  RESPONSE:

## 2023-09-09 NOTE — Telephone Encounter (Signed)
Pt called. Phone again went to VM. LVM to return call again

## 2023-09-09 NOTE — Telephone Encounter (Signed)
Pt called. Phone didn't ring went straight to VM. Advised patient to return call

## 2023-09-14 DIAGNOSIS — E113393 Type 2 diabetes mellitus with moderate nonproliferative diabetic retinopathy without macular edema, bilateral: Secondary | ICD-10-CM | POA: Diagnosis not present

## 2023-10-26 ENCOUNTER — Ambulatory Visit
Admission: RE | Admit: 2023-10-26 | Discharge: 2023-10-26 | Disposition: A | Discharge status: Home / Self Care | Payer: Medicare Other | Source: Ambulatory Visit | Attending: Family Medicine | Admitting: Family Medicine

## 2023-10-26 DIAGNOSIS — Z1231 Encounter for screening mammogram for malignant neoplasm of breast: Secondary | ICD-10-CM | POA: Diagnosis not present

## 2023-10-29 ENCOUNTER — Other Ambulatory Visit: Payer: Self-pay | Admitting: Family Medicine

## 2023-10-29 DIAGNOSIS — R928 Other abnormal and inconclusive findings on diagnostic imaging of breast: Secondary | ICD-10-CM

## 2023-11-09 ENCOUNTER — Ambulatory Visit: Payer: Medicare Other

## 2023-11-09 ENCOUNTER — Ambulatory Visit
Admission: RE | Admit: 2023-11-09 | Discharge: 2023-11-09 | Disposition: A | Discharge status: Home / Self Care | Payer: Medicare Other | Source: Ambulatory Visit | Attending: Family Medicine | Admitting: Family Medicine

## 2023-11-09 DIAGNOSIS — R928 Other abnormal and inconclusive findings on diagnostic imaging of breast: Secondary | ICD-10-CM | POA: Diagnosis not present

## 2023-11-26 ENCOUNTER — Encounter: Payer: Self-pay | Admitting: Gastroenterology

## 2023-11-26 ENCOUNTER — Ambulatory Visit: Payer: Medicare Other | Admitting: Gastroenterology

## 2023-11-26 ENCOUNTER — Other Ambulatory Visit: Payer: Self-pay

## 2023-11-26 ENCOUNTER — Telehealth: Payer: Self-pay | Admitting: Gastroenterology

## 2023-11-26 ENCOUNTER — Other Ambulatory Visit: Payer: Medicare Other

## 2023-11-26 VITALS — BP 142/76 | HR 72 | Wt 111.0 lb

## 2023-11-26 DIAGNOSIS — D649 Anemia, unspecified: Secondary | ICD-10-CM

## 2023-11-26 LAB — CBC WITH DIFFERENTIAL/PLATELET
Basophils Absolute: 0.1 10*3/uL (ref 0.0–0.1)
Basophils Relative: 1 % (ref 0.0–3.0)
Eosinophils Absolute: 0.1 10*3/uL (ref 0.0–0.7)
Eosinophils Relative: 0.9 % (ref 0.0–5.0)
HCT: 20.8 % — CL (ref 36.0–46.0)
Hemoglobin: 7.3 g/dL — CL (ref 12.0–15.0)
Lymphocytes Relative: 35.4 % (ref 12.0–46.0)
Lymphs Abs: 3.8 10*3/uL (ref 0.7–4.0)
MCHC: 35.2 g/dL (ref 30.0–36.0)
MCV: 102.1 fL — ABNORMAL HIGH (ref 78.0–100.0)
Monocytes Absolute: 1 10*3/uL (ref 0.1–1.0)
Monocytes Relative: 9.7 % (ref 3.0–12.0)
Neutro Abs: 5.6 10*3/uL (ref 1.4–7.7)
Neutrophils Relative %: 53 % (ref 43.0–77.0)
Platelets: 202 10*3/uL (ref 150.0–400.0)
RBC: 2.04 Mil/uL — ABNORMAL LOW (ref 3.87–5.11)
RDW: 15 % (ref 11.5–15.5)
WBC: 10.7 10*3/uL — ABNORMAL HIGH (ref 4.0–10.5)

## 2023-11-26 LAB — IBC + FERRITIN
Ferritin: 55.3 ng/mL (ref 10.0–291.0)
Iron: 72 ug/dL (ref 42–145)
Saturation Ratios: 28.1 % (ref 20.0–50.0)
TIBC: 256.2 ug/dL (ref 250.0–450.0)
Transferrin: 183 mg/dL — ABNORMAL LOW (ref 212.0–360.0)

## 2023-11-26 LAB — B12 AND FOLATE PANEL
Folate: 12.5 ng/mL (ref >5.9)
Vitamin B-12: >1537 pg/mL — ABNORMAL HIGH (ref 211–911)

## 2023-11-26 MED ORDER — NA SULFATE-K SULFATE-MG SULF 17.5-3.13-1.6 GM/177ML PO SOLN: 1.0000 | Freq: Once | ORAL | 0 refills | Status: AC

## 2023-11-26 NOTE — Progress Notes (Signed)
11/26/2023 Cynthia Burns [id number] 12-03-1946   HISTORY OF PRESENT ILLNESS: This is a 77 year old female who was previously patient of Dr. Regino Schultze.  Has not been seen here since last colonoscopy November 2014.  She is here today again to discuss another colonoscopy.  It is noted that she had a drop in hemoglobin from 10.9 g in March down to 7.6 g in September.  Iron studies normal.  Patient has not been contacted for recheck.  She started oral iron supplement on her own once daily.  She takes vitamin B12 daily for quite some time.  She had a negative fecal immunochemistry.  She denies any overt GI bleeding, no black or bloody stools.  No other GI complaints.   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 She has never used smokeless tobacco. She reports that she does not drink alcohol and does not use drugs. family history includes COPD in her father; Diabetes in her father; Emphysema in her father; Heart disease in her mother; Hypertension in her mother; Stroke in her sister. Allergies  Allergen Reactions   Mucinex [Guaifenesin Er] Anaphylaxis   Codeine Nausea And Vomiting   Paxlovid [Nirmatrelvir-Ritonavir] Diarrhea and Nausea And Vomiting   Advicor [Niacin-Lovastatin Er] Rash   Amlodipine Rash   Lisinopril-Hydrochlorothiazide Rash   Other Rash    Eye drops with preservatives.     Ramipril Rash   Sulfonamide Derivatives Rash      Outpatient Encounter Medications as of 11/26/2023   Medication Sig   amoxicillin (AMOXIL) 500 MG capsule Take 500 mg by mouth. When going to the dentist   aspirin EC 81 MG tablet Take 1 tablet (81 mg total) by mouth daily.   BIOTIN PO Take by mouth.   Blood Glucose Monitoring Suppl (ONE TOUCH ULTRA 2) w/Device KIT CHECK BLOOD SUGAR TWICE DAILY.  DX CODE  E11.9   CALCIUM PO Take by mouth. 1-2 times a week   Cyanocobalamin (VITAMIN B-12 PO) Take 1 tablet by mouth daily.    ferrous sulfate 324 MG TBEC Take 324 mg by mouth.   fexofenadine (ALLEGRA) 180 MG tablet Take 180 mg by mouth daily.   furosemide (LASIX) 20 MG tablet TAKE ONE-HALF TABLET BY MOUTH EVERY DAY   glucose blood (ONETOUCH ULTRA TEST) test strip Use as instructed   Lancets (ONETOUCH DELICA PLUS LANCET33G) MISC USE 1 LANCET TO TEST AS DIRECTED (DISCARD LANCET IN SHARPS CONTAINER IMMEDIATELY AFTER USE)   levothyroxine (SYNTHROID) 25 MCG tablet TAKE ONE TABLET BY MOUTH EVERY DAY BEFORE BREAKFAST   losartan (COZAAR) 50 MG tablet Take 1 tablet (50 mg total) by mouth daily. TAKE ONE TABLET BY MOUTH EVERY DAY   magnesium oxide (MAG-OX) 400 MG tablet Take 400 mg by mouth daily.   metFORMIN (GLUCOPHAGE) 850 MG tablet TAKE ONE TABLET BY MOUTH THREE TIMES A  DAY WITH FOOD   Multiple Vitamin (MULTIVITAMIN PO) Take by mouth. With iron   Multiple Vitamins-Minerals (PRESERVISION AREDS 2) CAPS Take by mouth.   nebivolol (BYSTOLIC) 5 MG tablet TAKE ONE TABLET BY MOUTH EVERY DAY   rosuvastatin (CRESTOR) 20 MG tablet TAKE ONE TABLET BY MOUTH EVERY DAY   Semaglutide (RYBELSUS) 7 MG TABS Take 1 tablet (7 mg total) by mouth daily.   VITAMIN D, CHOLECALCIFEROL, PO Take by mouth daily. D3   ZINC-VITAMIN C PO Take 500 mg by mouth daily.   No facility-administered encounter medications on file as of 11/26/2023.    REVIEW OF SYSTEMS  : All other systems reviewed and negative except where noted in the History of Present Illness.   PHYSICAL EXAM: BP (!) 142/76   Pulse 72   Wt 111 lb (50.3 kg)   BMI  20.63 kg/m  General: Well developed white female in no acute distress Head: Normocephalic and atraumatic Eyes:  Sclerae anicteric, conjunctiva pink. Ears: Normal auditory acuity Lungs: Clear throughout to auscultation; no W/R/R. Heart: Regular rate and rhythm; no M/R/G. Abdomen: Soft, non-distended.  BS present.  Non-tender. Rectal:  Will be done at the time of colonoscopy.   Musculoskeletal: Symmetrical with no gross deformities  Skin: No lesions on visible extremities Extremities: No edema  Neurological: Alert oriented x 4, grossly nonfocal Cervical Nodes:  No significant cervical adenopathy Psychological:  Alert and cooperative. Normal mood and affect  ASSESSMENT AND PLAN: *Anemia: Drop in hemoglobin from 10.9 g in March down to 7.6 g in September.  Iron studies normal.  Patient has not been contacted for recheck.  She started oral iron supplement on her own once daily.  She takes vitamin B12 daily for quite some time.  Last colonoscopy was 10 years ago.  She had a negative fecal immunochemistry.  Will plan for both EGD and colonoscopy.  She denies any overt GI bleeding.  These will be scheduled with Dr. Lavon Paganini.  Will recheck CBC today as well as iron studies, B12 and folate levels.  The risks, benefits, and alternatives to EGD and colonoscopy were discussed with the patient and she consents to proceed.   CC:  Donato Schultz, *

## 2023-11-26 NOTE — Patient Instructions (Addendum)
Your provider has requested that you go to the basement level for lab work before leaving today. Press "B" on the elevator. The lab is located at the first door on the left as you exit the elevator.  You have been scheduled for an endoscopy and colonoscopy. Please follow the written instructions given to you at your visit today.  Please pick up your prep supplies at the pharmacy within the next 1-3 days.  If you use inhalers (even only as needed), please bring them with you on the day of your procedure.  DO NOT TAKE 7 DAYS PRIOR TO TEST- Trulicity (dulaglutide) Ozempic, Wegovy (semaglutide) Mounjaro (tirzepatide) Bydureon Bcise (exanatide extended release)  DO NOT TAKE 1 DAY PRIOR TO YOUR TEST Rybelsus (semaglutide) Adlyxin (lixisenatide) Victoza (liraglutide) Byetta (exanatide) _______________________________________________________  If your blood pressure at your visit was 140/90 or greater, please contact your primary care physician to follow up on this.  _______________________________________________________  If you are age 34 or older, your body mass index should be between 23-30. Your Body mass index is 20.63 kg/m. If this is out of the aforementioned range listed, please consider follow up with your Primary Care Provider.  If you are age 75 or younger, your body mass index should be between 19-25. Your Body mass index is 20.63 kg/m. If this is out of the aformentioned range listed, please consider follow up with your Primary Care Provider.   ________________________________________________________  The [facility] providers would like to encourage you to use [facility] to communicate with providers for non-urgent requests or questions.  Due to long hold times on the telephone, sending your provider a message by [facility] may be a faster and more efficient way to get a response.  Please allow 48 business hours for a response.  Please remember that this is for non-urgent requests.   _______________________________________________________

## 2023-11-26 NOTE — Telephone Encounter (Signed)
See results note dated 12/5.

## 2023-11-26 NOTE — Telephone Encounter (Signed)
Inbound call from patient, wanting to follow up on lab results.

## 2023-11-27 ENCOUNTER — Telehealth: Payer: Self-pay | Admitting: Gastroenterology

## 2023-11-27 NOTE — Telephone Encounter (Signed)
Patients husband called stated the patient as an appt for infusion has questions on what medications she can take.

## 2023-11-27 NOTE — Telephone Encounter (Signed)
The pt has been advised to follow the instructions per the patient care center.  No meds need to be held from our perspective.  The pt has been advised of the information and verbalized understanding.

## 2023-12-02 ENCOUNTER — Non-Acute Institutional Stay (HOSPITAL_COMMUNITY)
Admission: RE | Admit: 2023-12-02 | Discharge: 2023-12-02 | Disposition: A | Discharge status: Home / Self Care | Payer: Medicare Other | Source: Ambulatory Visit | Attending: Internal Medicine | Admitting: Internal Medicine

## 2023-12-02 DIAGNOSIS — D649 Anemia, unspecified: Secondary | ICD-10-CM | POA: Diagnosis not present

## 2023-12-02 LAB — ABO/RH: ABO/RH(D): A POS

## 2023-12-02 LAB — PREPARE RBC (CROSSMATCH)

## 2023-12-02 MED ORDER — SODIUM CHLORIDE 0.9% IV SOLUTION: Freq: Once | INTRAVENOUS | Status: AC

## 2023-12-02 NOTE — Progress Notes (Signed)
PATIENT CARE CENTER NOTE  Diagnosis: Anemia, unspecified type   Provider: Doug Sou PA-C  Procedure: 2 Units PRBCS  Note: Patient came to clinic today for blood transfusion. Blood consent obtained prior to infusion. Pts BP on arrival is elevated (165/75). Pt reports having no symptoms and just feeling nervous. Pt reports taking her BP medications this morning. Ordering provider notified and per provider pt can receive transfusion and just to continue to monitor BP. No pre-meds ordered. Pts BP checked multiple time. Highest BP was 197/83, provider notified. Prior to transfusion BP is 182/84. Provider notified and per her attending we should continue with the blood transfusion. Pt reports feeling fine with no symptoms. Per provider pt should follow up with her PCP regarding blood pressure. Pt tolerated 2 units PRBCS with no issues. BP remains elevated, but consistent. RN contacted Doug Sou, PA-C about halfway through second unit to advise her that pts BP remains elevated, and that pt has scheduled an appointment with her PCP for tomorrow to follow up about BP. At end of transfusion pts BP is 185/77. Pt reports feeling fine with no symptoms. RN advises pt that if she develops any symptoms and educated pt on symptoms to watch out for, she should go to the Emergency Room. RN advised pt to recheck BP at home with home BP machine, and if it remains elevated pt should report to the ER, pt verbalized understanding. AVS printed and given to the pt. Pt is alert, oriented, and ambulatory at discharge. Pt is discharged home with her husband.

## 2023-12-03 ENCOUNTER — Encounter: Payer: Self-pay | Admitting: Family Medicine

## 2023-12-03 ENCOUNTER — Ambulatory Visit (INDEPENDENT_AMBULATORY_CARE_PROVIDER_SITE_OTHER): Payer: Medicare Other | Admitting: Family Medicine

## 2023-12-03 ENCOUNTER — Telehealth: Payer: Self-pay | Admitting: Cardiovascular Disease

## 2023-12-03 VITALS — BP 180/100 | HR 72 | Temp 97.8°F | Resp 18 | Ht 61.5 in | Wt 111.4 lb

## 2023-12-03 DIAGNOSIS — N289 Disorder of kidney and ureter, unspecified: Secondary | ICD-10-CM

## 2023-12-03 DIAGNOSIS — I10 Essential (primary) hypertension: Secondary | ICD-10-CM | POA: Diagnosis not present

## 2023-12-03 DIAGNOSIS — Z7984 Long term (current) use of oral hypoglycemic drugs: Secondary | ICD-10-CM | POA: Diagnosis not present

## 2023-12-03 DIAGNOSIS — E039 Hypothyroidism, unspecified: Secondary | ICD-10-CM

## 2023-12-03 DIAGNOSIS — E1165 Type 2 diabetes mellitus with hyperglycemia: Secondary | ICD-10-CM | POA: Diagnosis not present

## 2023-12-03 DIAGNOSIS — D509 Iron deficiency anemia, unspecified: Secondary | ICD-10-CM | POA: Diagnosis not present

## 2023-12-03 LAB — TYPE AND SCREEN
ABO/RH(D): A POS
Antibody Screen: NEGATIVE
Unit division: 0
Unit division: 0

## 2023-12-03 LAB — COMPREHENSIVE METABOLIC PANEL
ALT: 16 U/L (ref 0–35)
AST: 16 U/L (ref 0–37)
Albumin: 3.5 g/dL (ref 3.5–5.2)
Alkaline Phosphatase: 27 U/L — ABNORMAL LOW (ref 39–117)
BUN: 26 mg/dL — ABNORMAL HIGH (ref 6–23)
CO2: 24 meq/L (ref 19–32)
Calcium: 8.8 mg/dL (ref 8.4–10.5)
Chloride: 103 meq/L (ref 96–112)
Creatinine, Ser: 1.69 mg/dL — ABNORMAL HIGH (ref 0.40–1.20)
GFR: 29.27 mL/min — ABNORMAL LOW (ref >60.00)
Glucose, Bld: 96 mg/dL (ref 70–99)
Potassium: 3.6 meq/L (ref 3.5–5.1)
Sodium: 129 meq/L — ABNORMAL LOW (ref 135–145)
Total Bilirubin: 0.5 mg/dL (ref 0.2–1.2)
Total Protein: 15.1 g/dL — ABNORMAL HIGH (ref 6.0–8.3)

## 2023-12-03 LAB — LIPID PANEL
Cholesterol: 82 mg/dL (ref 0–200)
HDL: 42.4 mg/dL (ref >39.00)
LDL Cholesterol: 24 mg/dL (ref 0–99)
NonHDL: 39.61
Total CHOL/HDL Ratio: 2
Triglycerides: 80 mg/dL (ref 0.0–149.0)
VLDL: 16 mg/dL (ref 0.0–40.0)

## 2023-12-03 LAB — CBC WITH DIFFERENTIAL/PLATELET
Basophils Absolute: 0.1 10*3/uL (ref 0.0–0.1)
Basophils Relative: 0.8 % (ref 0.0–3.0)
Eosinophils Absolute: 0.1 10*3/uL (ref 0.0–0.7)
Eosinophils Relative: 1 % (ref 0.0–5.0)
HCT: 31.4 % — ABNORMAL LOW (ref 36.0–46.0)
Hemoglobin: 11 g/dL — ABNORMAL LOW (ref 12.0–15.0)
Lymphocytes Relative: 32.7 % (ref 12.0–46.0)
Lymphs Abs: 2.6 10*3/uL (ref 0.7–4.0)
MCHC: 34.9 g/dL (ref 30.0–36.0)
MCV: 96.7 fL (ref 78.0–100.0)
Monocytes Absolute: 0.8 10*3/uL (ref 0.1–1.0)
Monocytes Relative: 10.5 % (ref 3.0–12.0)
Neutro Abs: 4.4 10*3/uL (ref 1.4–7.7)
Neutrophils Relative %: 55 % (ref 43.0–77.0)
Platelets: 188 10*3/uL (ref 150.0–400.0)
RBC: 3.25 Mil/uL — ABNORMAL LOW (ref 3.87–5.11)
RDW: 17.9 % — ABNORMAL HIGH (ref 11.5–15.5)
WBC: 8 10*3/uL (ref 4.0–10.5)

## 2023-12-03 LAB — BPAM RBC
Blood Product Expiration Date: 202501012359
Blood Product Expiration Date: 202501012359
ISSUE DATE / TIME: 202412111232
ISSUE DATE / TIME: 202412111232
Unit Type and Rh: 6200
Unit Type and Rh: 6200

## 2023-12-03 LAB — HEMOGLOBIN A1C: Hgb A1c MFr Bld: 6.2 % (ref 4.6–6.5)

## 2023-12-03 MED ORDER — LOSARTAN POTASSIUM 100 MG PO TABS: 100.0000 mg | ORAL_TABLET | Freq: Every day | ORAL | 3 refills | Status: DC

## 2023-12-03 NOTE — Telephone Encounter (Signed)
Pt c/o BP issue: STAT if pt c/o blurred vision, one-sided weakness or slurred speech  1. What are your last 5 BP readings?   159/82 (this morning before PCP visit) 195/100 (this morning at PCP office) 175/85 180/92 185/95 183/106  2. Are you having any other symptoms (ex. Dizziness, headache, blurred vision, passed out)?   No  3. What is your BP issue?    Patient stated she had a blood transfusion yesterday as she has been anemic.  Patient noted she will have a colonoscopy and endoscopy in February. Patient wants call back on next steps.

## 2023-12-03 NOTE — Progress Notes (Signed)
Established Patient Office Visit  Subjective   [name], female    DOB: 1946-01-09  Age: 77 y.o. MRN: [id number]  Chief Complaint  Patient presents with   Hypertension   Follow-up    HPI Discussed the use of AI scribe software for clinical note transcription with the patient, who gave verbal consent to proceed.  History of Present Illness   The patient, with a history of hypertension, diabetes, and anemia, presents with concerns about persistently elevated blood pressure readings. They report a recent blood transfusion due to low hemoglobin levels, which were discovered during a visit to a nurse practitioner for an upcoming endoscopy and colonoscopy. The patient mentions that the transfusion process was lengthy, lasting from morning until evening, and required two units of blood.  Despite the transfusion, the patient's blood pressure remained high, with readings ranging from 164/71 to 197/83. The patient denies experiencing any symptoms such as headaches associated with the elevated blood pressure. They were advised to seek immediate medical attention if their blood pressure did not decrease, but it eventually did, and they were allowed to go home.  The patient also reports a history of an aneurysm and is under the care of a cardiologist, Dr. Winn Jock. They are currently on losartan, which was adjusted a few months ago due to high blood pressure. The patient expresses a preference to have their blood pressure managed by their cardiologist rather than adjusting the medication themselves.  In addition to hypertension, the patient is managing diabetes with Rybelsus, which initially caused significant nausea but has since improved. They also take thyroid medication, which they believe may be contributing to their recent weight loss. The patient reports a current weight of 109 pounds, down from a previous weight of 106 pounds. They express concern about further weight loss and  question whether they should continue taking the thyroid medication.  The patient is scheduled for an endoscopy and colonoscopy on February 7th but is concerned about the timing of the procedure due to their diabetes. They express a desire to be placed on a cancellation list for an earlier appointment if possible. They also question whether they need to have additional blood work done before the procedure.      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   Plantar wart of right foot 03/15/2015   Uncontrolled type 2 diabetes mellitus with hyperglycemia (HCC) 09/16/2013   Elevated lipids 09/05/2013   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   OVERWEIGHT 02/07/2009   PHARYNGITIS, ACUTE 06/03/2007   Essential hypertension 05/18/2007   MITRAL VALVE PROLAPSE 05/18/2007   OSTEOPENIA 05/18/2007   Past Medical History:  Diagnosis Date   Abnormal Pap smear of cervix    pt not 100 percent sure but believes she did   Allergy    Anemia    Diabetes mellitus without complication (HCC)    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   HSV-1 infection    HYPERTENSION    Hypothyroid    MITRAL VALVE PROLAPSE    OSTEOPENIA  Overweight(278.02)    PHARYNGITIS, ACUTE    Past Surgical History:  Procedure Laterality Date   ABDOMINAL HYSTERECTOMY     CHOLECYSTECTOMY     WISDOM TOOTH EXTRACTION     Social History   Tobacco Use   Smoking status: Never   Smokeless tobacco: Never  Vaping Use   Vaping status: Never Used  Substance Use Topics   Alcohol use: No     Alcohol/week: 0.0 standard drinks of alcohol   Drug use: No   Social History   Socioeconomic History   Marital status: Married    Spouse name: Not on file   Number of children: Not on file   Years of education: Not on file   Highest education level: Associate degree: occupational, Scientist, product/process development, or vocational program  Occupational History   Occupation: hairdresser  Tobacco Use   Smoking status: Never   Smokeless tobacco: Never  Vaping Use   Vaping status: Never Used  Substance and Sexual Activity   Alcohol use: No    Alcohol/week: 0.0 standard drinks of alcohol   Drug use: No   Sexual activity: Not Currently    Partners: Male    Birth control/protection: Surgical, Abstinence    Comment: hysterectomy, 17, less than 5  Other Topics Concern   Not on file  Social History Narrative   Not on file   Social Drivers of Health   Financial Resource Strain: Low Risk  (12/02/2023)   Overall Financial Resource Strain (CARDIA)    Difficulty of Paying Living Expenses: Not hard at all  Food Insecurity: No Food Insecurity (12/02/2023)   Hunger Vital Sign    Worried About Running Out of Food in the Last Year: Never true    Ran Out of Food in the Last Year: Never true  Transportation Needs: No Transportation Needs (12/02/2023)   PRAPARE - Administrator, Civil Service (Medical): No    Lack of Transportation (Non-Medical): No  Physical Activity: Insufficiently Active (12/02/2023)   Exercise Vital Sign    Days of Exercise per Week: 2 days    Minutes of Exercise per Session: 10 min  Stress: No Stress Concern Present (12/02/2023)   [employer] of Occupational Health - Occupational Stress Questionnaire    Feeling of Stress : Not at all  Social Connections: Unknown (12/02/2023)   Social Connection and Isolation Panel [NHANES]    Frequency of Communication with Friends and Family: More than three times a week    Frequency of Social Gatherings with Friends and Family: Twice  a week    Attends Religious Services: Patient declined    Database administrator or Organizations: Patient declined    Attends Engineer, structural: More than 4 times per year    Marital Status: Married  Catering manager Violence: Not At Risk (07/31/2023)   Humiliation, Afraid, Rape, and Kick questionnaire    Fear of Current or Ex-Partner: No    Emotionally Abused: No    Physically Abused: No    Sexually Abused: No   Family Status  Relation Name Status   Mother Naaman Plummer Deceased   Father Arnetha Gula Deceased   Sister Marlowe Alt (Not Specified)   MGM  Deceased   MGF  Deceased   PGM  Deceased   PGF  Deceased   Other  (Not Specified)   Neg Hx  (Not Specified)  No partnership data on file   Family History  Problem Relation Age of Onset   Hypertension Mother  Heart disease Mother        Had a stent   Emphysema Father    Diabetes Father    COPD Father    Stroke Sister    Breast cancer Neg Hx    Allergies  Allergen Reactions   Mucinex [Guaifenesin Er] Anaphylaxis   Codeine Nausea And Vomiting   Paxlovid [Nirmatrelvir-Ritonavir] Diarrhea and Nausea And Vomiting   Advicor [Niacin-Lovastatin Er] Rash   Amlodipine Rash   Lisinopril-Hydrochlorothiazide Rash   Other Rash    Eye drops with preservatives.     Ramipril Rash   Sulfonamide Derivatives Rash      Review of Systems  Constitutional:  Negative for fever and malaise/fatigue.  HENT:  Negative for congestion.   Eyes:  Negative for blurred vision.  Respiratory:  Negative for cough and shortness of breath.   Cardiovascular:  Negative for chest pain, palpitations and leg swelling.  Gastrointestinal:  Negative for abdominal pain, blood in stool, nausea and vomiting.  Genitourinary:  Negative for dysuria and frequency.  Musculoskeletal:  Negative for back pain and falls.  Skin:  Negative for rash.  Neurological:  Negative for dizziness, loss of consciousness and headaches.  Endo/Heme/Allergies:  Negative for  environmental allergies.  Psychiatric/Behavioral:  Negative for depression. The patient is not nervous/anxious.       Objective:     BP (!) 180/100 (BP Location: Left Arm, Patient Position: Sitting)   Pulse 72   Temp 97.8 F (36.6 C) (Oral)   Resp 18   Ht 5' 1.5" (1.562 m)   Wt 111 lb 6.4 oz (50.5 kg)   SpO2 98%   BMI 20.71 kg/m  BP Readings from Last 3 Encounters:  12/03/23 (!) 180/100  12/02/23 (!) 185/77  11/26/23 (!) 142/76   Wt Readings from Last 3 Encounters:  12/03/23 111 lb 6.4 oz (50.5 kg)  11/26/23 111 lb (50.3 kg)  08/27/23 114 lb 12.8 oz (52.1 kg)   SpO2 Readings from Last 3 Encounters:  12/03/23 98%  12/02/23 99%  08/27/23 97%      Physical Exam Vitals and nursing note reviewed.  Constitutional:      General: She is not in acute distress.    Appearance: Normal appearance. She is well-developed.  HENT:     Head: Normocephalic and atraumatic.  Eyes:     General: No scleral icterus.       Right eye: No discharge.        Left eye: No discharge.  Cardiovascular:     Rate and Rhythm: Normal rate and regular rhythm.     Heart sounds: No murmur heard. Pulmonary:     Effort: Pulmonary effort is normal. No respiratory distress.     Breath sounds: Normal breath sounds.  Musculoskeletal:        General: Normal range of motion.     Cervical back: Normal range of motion and neck supple.     Right lower leg: No edema.     Left lower leg: No edema.  Skin:    General: Skin is warm and dry.  Neurological:     Mental Status: She is alert and oriented to person, place, and time.  Psychiatric:        Mood and Affect: Mood normal.        Behavior: Behavior normal.        Thought Content: Thought content normal.        Judgment: Judgment normal.      No results found for any  visits on 12/03/23.  Last CBC Lab Results  Component Value Date   WBC 10.7 (H) 11/26/2023   HGB 7.3 Repeated and verified X2. (LL) 11/26/2023   HCT 20.8 Repeated and verified X2.  (LL) 11/26/2023   MCV 102.1 (H) 11/26/2023   RDW 15.0 11/26/2023   PLT 202.0 11/26/2023   Last metabolic panel Lab Results  Component Value Date   GLUCOSE 91 09/01/2023   NA 131 (L) 09/01/2023   K 3.8 09/01/2023   CL 104 09/01/2023   CO2 26 09/01/2023   BUN 21 09/01/2023   CREATININE 1.27 (H) 09/01/2023   GFR 41.31 (L) 09/01/2023   CALCIUM 8.9 09/01/2023   PROT 13.2 (H) 09/01/2023   ALBUMIN 3.3 (L) 09/01/2023   BILITOT 0.3 09/01/2023   ALKPHOS 30 (L) 09/01/2023   AST 16 09/01/2023   ALT 14 09/01/2023   Last lipids Lab Results  Component Value Date   CHOL 89 08/27/2023   HDL 45.30 08/27/2023   LDLCALC 21 08/27/2023   LDLDIRECT 53.0 05/25/2018   TRIG 112.0 08/27/2023   CHOLHDL 2 08/27/2023   Last hemoglobin A1c Lab Results  Component Value Date   HGBA1C 6.3 08/27/2023   Last thyroid functions Lab Results  Component Value Date   TSH 2.25 08/27/2023   T4TOTAL 6.6 02/24/2022      The ASCVD Risk score (Arnett DK, et al., 2019) failed to calculate for the following reasons:   The valid total cholesterol range is 130 to 320 mg/dL    Assessment & Plan:   Problem List Items Addressed This Visit       Unprioritized   Essential hypertension - Primary   Relevant Orders   CBC with Differential/Platelet   Comprehensive metabolic panel   Lipid panel   Uncontrolled type 2 diabetes mellitus with hyperglycemia (HCC)   Relevant Orders   Comprehensive metabolic panel   Lipid panel   Hemoglobin A1c   Hypothyroidism   Relevant Orders   Thyroid Panel With TSH   Anemia   Relevant Orders   CBC with Differential/Platelet  Assessment and Plan    Hypertension Chronic hypertension with recent significant elevations (180/100 mmHg, 197/83 mmHg) is managed with losartan 50 mg. A recent blood transfusion correlated with these elevated readings. They prefer a cardiologist consultation for medication adjustments, considering the risks of stroke and heart attack against the  benefits of better control and reduced complications. We will message Dr. Winn Jock regarding the elevated blood pressure and request an urgent appointment, await Dr. Lita Mains evaluation before adjusting losartan, and monitor blood pressure regularly.  Anemia They present with recent anemia, hemoglobin at 7.3 g/dL, necessitating a transfusion, and are scheduled for endoscopy and colonoscopy to identify the cause. Risks include fatigue, weakness, and cardiovascular strain, while benefits include identifying and treating the underlying cause. We will ensure follow-up blood work is completed before the endoscopy and colonoscopy, confirm the timing of blood work with the GI office, and request placement on the cancellation list for earlier appointments.  Diabetes Mellitus Type 2 diabetes is managed with Rybelsus and thyroid medication. They report good glycemic control but are experiencing weight loss and nausea, possibly related to medication. Risks include neuropathy, retinopathy, and cardiovascular disease, with benefits of adjustment including improved symptom management and glycemic control. We will check A1c and relevant labs today and discuss potential medication adjustments based on lab results or continued adverse effects.  Hypothyroidism Hypothyroidism is managed with thyroid medication. They report weight loss and concerns about interaction with Rybelsus.  Risks include fatigue, weight gain, and cardiovascular issues, with benefits of adjustment including better symptom management and overall health. We will review thyroid function tests with today's labs and evaluate the necessity of continuing current thyroid medication based on lab results.  General Health Maintenance They are due for routine cardiologist follow-up and ongoing management of multiple chronic conditions. The importance of regular follow-ups and medication reviews has been emphasized. We will review and update all medications in the  chart and encourage maintaining regular follow-ups with all specialists.  Follow-up We will schedule a follow-up with Dr. Winn Jock for hypertension management, ensure blood work is completed before endoscopy and colonoscopy, and follow up on lab results and adjust medications as necessary.        No follow-ups on file.    Donato Schultz, DO

## 2023-12-03 NOTE — Patient Instructions (Signed)

## 2023-12-03 NOTE — Telephone Encounter (Signed)
Wendall Stade, MD  Donato Schultz, DO; Ethelda Chick, RN Pam - can you call Airalynn and tell her I agree with increasing her Cozaar to 100 mg   Called patient to let her know we got her message, and Dr. Zola Button message Dr. Eden Emms about increasing patient's losartan and he agrees to do the increase. Will send in to patient's pharmacy.

## 2023-12-04 LAB — THYROID PANEL WITH TSH
Free Thyroxine Index: 2.3 (ref 1.4–3.8)
T3 Uptake: 35 % (ref 22–35)
T4, Total: 6.7 ug/dL (ref 5.1–11.9)
TSH: 1.14 m[IU]/L (ref 0.40–4.50)

## 2023-12-04 NOTE — Telephone Encounter (Signed)
Patient called requesting to speak with a nurse in regards to her receiving a blood transfusion on 12/11.  Please advise.   Thank you

## 2023-12-07 ENCOUNTER — Other Ambulatory Visit: Payer: Self-pay

## 2023-12-07 ENCOUNTER — Telehealth: Payer: Self-pay | Admitting: Cardiovascular Disease

## 2023-12-07 MED ORDER — HYDRALAZINE HCL 25 MG PO TABS: 25.0000 mg | ORAL_TABLET | Freq: Three times a day (TID) | ORAL | 3 refills | Status: DC

## 2023-12-07 NOTE — Telephone Encounter (Signed)
The pt wanted to let us know she did have her infusion. She states she has some blood pressure concerns but she is following up with cardiology in regards to that.

## 2023-12-07 NOTE — Telephone Encounter (Signed)
Pt states she needs to speak with Pam. Please advise

## 2023-12-07 NOTE — Telephone Encounter (Signed)
Spoke with Pt and her husband. Pts blood pressure has been running high. While on phone 215/112 in left arm, 220/114 in right arm with 78 HR. Pt denies having any symptoms. Pt had blood transfusion on 12/11 at [facility], bp was 165/75 and went up to 197/83 while there with no symptoms.   Dr Odis Hollingshead (DOD) suggested hydralazine 25mg  three times daily, and to hold if BP drops below 140/-. Pt is also following up with PCP about Kidney function. Dr Odis Hollingshead suggested follow up with in a week, was able to get appt with Dr Kenn File (DOD on 12/23). Will forward to Dr Odis Hollingshead and Dr Eden Emms (Pt has appt on 12/30/23 with)

## 2023-12-08 ENCOUNTER — Ambulatory Visit (INDEPENDENT_AMBULATORY_CARE_PROVIDER_SITE_OTHER): Payer: Medicare Other | Admitting: Family Medicine

## 2023-12-08 ENCOUNTER — Encounter: Payer: Self-pay | Admitting: Family Medicine

## 2023-12-08 VITALS — BP 138/100 | HR 78 | Temp 98.2°F | Resp 16 | Ht 61.5 in | Wt 112.4 lb

## 2023-12-08 DIAGNOSIS — N1832 Chronic kidney disease, stage 3b: Secondary | ICD-10-CM | POA: Diagnosis not present

## 2023-12-08 DIAGNOSIS — I10 Essential (primary) hypertension: Secondary | ICD-10-CM

## 2023-12-08 NOTE — Assessment & Plan Note (Signed)
Poorly controlled will alter medications, encouraged DASH diet, minimize caffeine and obtain adequate sleep. Report concerning symptoms and follow up as directed and as needed  cardiology started her on hydralazine tid and her bp is trending down Repeat cmp today May need to d/c losartan

## 2023-12-08 NOTE — Progress Notes (Signed)
Established Patient Office Visit  Subjective   [name], female    DOB: 1946-11-02  Age: 77 y.o. MRN: [id number]  Chief Complaint  Patient presents with   Discuss labs    HPI Discussed the use of AI scribe software for clinical note transcription with the patient, who gave verbal consent to proceed.  History of Present Illness   The patient, with a history of hypertension and kidney disease, presents with concerns about worsening kidney function and uncontrolled hypertension. Despite an increase in Losartan to 100mg , the patient reports no improvement in blood pressure control. The patient's blood pressure readings have been consistently high, with systolic readings close to 200 and diastolic readings ranging from 88 to 113. The patient's spouse reported an episode of extremely high blood pressure (215/113) that prompted a call to emergency services, but the patient was asymptomatic at the time.  The patient also reports a significant drop in kidney function over the last few months, with a GFR dropping from 55 to 29. The patient is concerned about the possibility of needing dialysis. The patient also has a history of anemia and recently underwent a blood transfusion, which she suspects may have contributed to the elevated blood pressure.  In addition to Losartan, the patient has recently started taking Hydralazine three times a day. The patient reports that this is the second day on the new medication and has noticed a slight improvement in blood pressure readings since starting the medication.  The patient also has a history of a grade three cystocele, which was managed conservatively with exercises to strengthen the pelvic floor muscles. The patient reports no current symptoms related to this condition.  The patient denies any chest pain, palpitations, shortness of breath, or swelling in the legs. The patient's fluid intake is adequate, with a reported intake of at  least four bottles of water a day. The patient also reports taking anxiety medication as needed.      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   Plantar wart of right foot 03/15/2015   Uncontrolled type 2 diabetes mellitus with hyperglycemia (HCC) 09/16/2013   Elevated lipids 09/05/2013   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   OVERWEIGHT 02/07/2009   PHARYNGITIS, ACUTE 06/03/2007   Essential hypertension 05/18/2007   MITRAL VALVE PROLAPSE 05/18/2007   OSTEOPENIA 05/18/2007   Past Medical History:  Diagnosis Date   Abnormal Pap smear of cervix    pt not 100 percent sure but believes she did   Allergy    Anemia    Diabetes mellitus without complication (HCC)    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   HSV-1 infection    HYPERTENSION    Hypothyroid    MITRAL VALVE PROLAPSE    OSTEOPENIA    Overweight(278.02)    PHARYNGITIS, ACUTE    Past Surgical History:  Procedure Laterality Date   ABDOMINAL HYSTERECTOMY     CHOLECYSTECTOMY     WISDOM TOOTH EXTRACTION     Social History   Tobacco Use   Smoking  status: Never   Smokeless tobacco: Never  Vaping Use   Vaping status: Never Used  Substance Use Topics   Alcohol use: No    Alcohol/week: 0.0 standard drinks of alcohol   Drug use: No   Social History   Socioeconomic History   Marital status: Married    Spouse name: Not on file   Number of children: Not on file   Years of education: Not on file   Highest education level: Associate degree: occupational, Scientist, product/process development, or  vocational program  Occupational History   Occupation: hairdresser  Tobacco Use   Smoking status: Never   Smokeless tobacco: Never  Vaping Use   Vaping status: Never Used  Substance and Sexual Activity   Alcohol use: No    Alcohol/week: 0.0 standard drinks of alcohol   Drug use: No   Sexual activity: Not Currently    Partners: Male    Birth control/protection: Surgical, Abstinence    Comment: hysterectomy, 17, less than 5  Other Topics Concern   Not on file  Social History Narrative   Not on file   Social Drivers of Health   Financial Resource Strain: Low Risk  (12/07/2023)   Overall Financial Resource Strain (CARDIA)    Difficulty of Paying Living Expenses: Not hard at all  Food Insecurity: No Food Insecurity (12/07/2023)   Hunger Vital Sign    Worried About Running Out of Food in the Last Year: Never true    Ran Out of Food in the Last Year: Never true  Transportation Needs: No Transportation Needs (12/07/2023)   PRAPARE - Administrator, Civil Service (Medical): No    Lack of Transportation (Non-Medical): No  Physical Activity: Insufficiently Active (12/07/2023)   Exercise Vital Sign    Days of Exercise per Week: 1 day    Minutes of Exercise per Session: 10 min  Stress: No Stress Concern Present (12/07/2023)   [employer] of Occupational Health - Occupational Stress Questionnaire    Feeling of Stress : Only a little  Social Connections: Unknown (12/07/2023)   Social Connection and Isolation Panel [NHANES]    Frequency of Communication with Friends and Family: More than three times a week    Frequency of Social Gatherings with Friends and Family: Once a week    Attends Religious Services: Patient declined    Database administrator or Organizations: Yes    Attends Banker Meetings: Patient declined    Marital Status: Married  Catering manager Violence: Not At Risk (07/31/2023)   Humiliation, Afraid, Rape, and Kick questionnaire    Fear  of Current or Ex-Partner: No    Emotionally Abused: No    Physically Abused: No    Sexually Abused: No   Family Status  Relation Name Status   Mother Naaman Plummer Deceased   Father Arnetha Gula Deceased   Sister Marlowe Alt (Not Specified)   MGM  Deceased   MGF  Deceased   PGM  Deceased   PGF  Deceased   Other  (Not Specified)   Neg Hx  (Not Specified)  No partnership data on file   Family History  Problem Relation Age of Onset   Hypertension Mother    Heart disease Mother        Had a stent   Emphysema Father    Diabetes Father    COPD Father    Stroke Sister    Breast cancer Neg Hx       Review of Systems  Constitutional:  Negative for fever and malaise/fatigue.  HENT:  Negative for congestion.   Eyes:  Negative for blurred vision.  Respiratory:  Negative for cough and shortness of breath.   Cardiovascular:  Negative for chest pain, palpitations and leg swelling.  Gastrointestinal:  Negative for abdominal pain, blood in stool, nausea and vomiting.  Genitourinary:  Negative for dysuria and frequency.  Musculoskeletal:  Negative for back pain and falls.  Skin:  Negative for rash.  Neurological:  Negative for dizziness, loss of consciousness and headaches.  Endo/Heme/Allergies:  Negative for environmental allergies.  Psychiatric/Behavioral:  Negative for depression. The patient is not nervous/anxious.       Objective:     BP (!) 138/100 (BP Location: Left Arm, Patient Position: Sitting, Cuff Size: Normal)   Pulse 78   Temp 98.2 F (36.8 C) (Oral)   Resp 16   Ht 5' 1.5" (1.562 m)   Wt 112 lb 6.4 oz (51 kg)   SpO2 98%   BMI 20.89 kg/m  BP Readings from Last 3 Encounters:  12/08/23 (!) 138/100  12/03/23 (!) 180/100  12/02/23 (!) 185/77   Wt Readings from Last 3 Encounters:  12/08/23 112 lb 6.4 oz (51 kg)  12/03/23 111 lb 6.4 oz (50.5 kg)  11/26/23 111 lb (50.3 kg)   SpO2 Readings from Last 3 Encounters:  12/08/23 98%  12/03/23 98%  12/02/23 99%       Physical Exam Vitals and nursing note reviewed.  Constitutional:      General: She is not in acute distress.    Appearance: Normal appearance. She is well-developed.  HENT:     Head: Normocephalic and atraumatic.  Eyes:     General: No scleral icterus.       Right eye: No discharge.        Left eye: No discharge.  Cardiovascular:     Rate and Rhythm: Normal rate and regular rhythm.     Heart sounds: No murmur heard. Pulmonary:     Effort: Pulmonary effort is normal. No respiratory distress.     Breath sounds: Normal breath sounds.  Musculoskeletal:        General: Normal range of motion.     Cervical back: Normal range of motion and neck supple.     Right lower leg: No edema.     Left lower leg: No edema.  Skin:    General: Skin is warm and dry.  Neurological:     Mental Status: She is alert and oriented to person, place, and time.  Psychiatric:        Mood and Affect: Mood normal.        Behavior: Behavior normal.        Thought Content: Thought content normal.        Judgment: Judgment normal.      No results found for any visits on 12/08/23.  Last CBC Lab Results  Component Value Date   WBC 8.0 12/03/2023   HGB 11.0 (L) 12/03/2023   HCT 31.4 (L) 12/03/2023   MCV 96.7 12/03/2023   RDW 17.9 (H) 12/03/2023   PLT 188.0 12/03/2023   Last metabolic panel Lab Results  Component Value Date   GLUCOSE 96 12/03/2023   NA 129 (L) 12/03/2023   K 3.6 12/03/2023   CL 103 12/03/2023   CO2 24 12/03/2023   BUN 26 (H) 12/03/2023   CREATININE 1.69 (H) 12/03/2023   GFR 29.27 (L) 12/03/2023   CALCIUM 8.8 12/03/2023   PROT 15.1 (  H) 12/03/2023   ALBUMIN 3.5 12/03/2023   BILITOT 0.5 12/03/2023   ALKPHOS 27 (L) 12/03/2023   AST 16 12/03/2023   ALT 16 12/03/2023   Last lipids Lab Results  Component Value Date   CHOL 82 12/03/2023   HDL 42.40 12/03/2023   LDLCALC 24 12/03/2023   LDLDIRECT 53.0 05/25/2018   TRIG 80.0 12/03/2023   CHOLHDL 2 12/03/2023   Last  hemoglobin A1c Lab Results  Component Value Date   HGBA1C 6.2 12/03/2023   Last thyroid functions Lab Results  Component Value Date   TSH 1.14 12/03/2023   T4TOTAL 6.7 12/03/2023   Last vitamin D Lab Results  Component Value Date   VD25OH 67.77 08/27/2023   Last vitamin B12 and Folate Lab Results  Component Value Date   VITAMINB12 >1537 (H) 11/26/2023   FOLATE 12.5 11/26/2023      The ASCVD Risk score (Arnett DK, et al., 2019) failed to calculate for the following reasons:   The valid total cholesterol range is 130 to 320 mg/dL    Assessment & Plan:   Problem List Items Addressed This Visit       Unprioritized   Essential hypertension   Poorly controlled will alter medications, encouraged DASH diet, minimize caffeine and obtain adequate sleep. Report concerning symptoms and follow up as directed and as needed  cardiology started her on hydralazine tid and her bp is trending down Repeat cmp today May need to d/c losartan      Other Visit Diagnoses       Chronic renal failure, stage 3b (HCC)    -  Primary   Relevant Orders   Comprehensive metabolic panel   Ambulatory referral to Nephrology     Assessment and Plan    Chronic Kidney Disease (CKD)   There has been a significant decline in kidney function over the past few months, with GFR decreasing from 55 to 29 and creatinine levels increasing. She exhibits possible anemia related to CKD but no evidence of internal bleeding. We will refer her to a nephrologist at [facility], order blood work to check current kidney function, and fax all relevant medical records to [facility] for a comprehensive review of medications and management of kidney function and anemia to avoid future transfusions.  Anemia   The anemia is likely secondary to CKD, following a recent blood transfusion on December 11, with no signs of internal bleeding. We will refer her to a nephrologist for further evaluation and management of  anemia to prevent future transfusions.  Hypertension   Despite increasing losartan to 100 mg and adding hydralazine three times a day, her blood pressure remains elevated, though some readings show improvement without symptoms such as headache, blurred vision, chest pain, or shortness of breath. The recent blood transfusion may have contributed to the elevated blood pressure. We will continue losartan 100 mg daily and hydralazine three times a day, monitor her blood pressure regularly, and reassess in a few days, considering an increase in hydralazine dosage if necessary.  Cystocele (Grade 3)   She has been diagnosed with a Grade 3 cystocele by a gynecologist, experiencing symptoms like burning and itching. We advised her to continue pelvic floor exercises to strengthen muscles, with no current intervention required.  General Health Maintenance   We discussed the importance of general hydration and lifestyle habits, advising her to drink at least four bottles of water daily.  Follow-up   We will ensure a follow-up with the nephrologist, regular blood  pressure monitoring, reassessment of kidney function with blood work, and sending all relevant medical records to [facility] for comprehensive care.        No follow-ups on file.    Donato Schultz, DO

## 2023-12-09 LAB — COMPREHENSIVE METABOLIC PANEL
ALT: 16 U/L (ref 0–35)
AST: 16 U/L (ref 0–37)
Albumin: 3.2 g/dL — ABNORMAL LOW (ref 3.5–5.2)
Alkaline Phosphatase: 27 U/L — ABNORMAL LOW (ref 39–117)
BUN: 33 mg/dL — ABNORMAL HIGH (ref 6–23)
CO2: 24 meq/L (ref 19–32)
Calcium: 8.8 mg/dL (ref 8.4–10.5)
Chloride: 102 meq/L (ref 96–112)
Creatinine, Ser: 1.67 mg/dL — ABNORMAL HIGH (ref 0.40–1.20)
GFR: 29.69 mL/min — ABNORMAL LOW (ref >60.00)
Glucose, Bld: 86 mg/dL (ref 70–99)
Potassium: 4.1 meq/L (ref 3.5–5.1)
Sodium: 129 meq/L — ABNORMAL LOW (ref 135–145)
Total Bilirubin: 0.4 mg/dL (ref 0.2–1.2)
Total Protein: 13.5 g/dL — ABNORMAL HIGH (ref 6.0–8.3)

## 2023-12-14 ENCOUNTER — Ambulatory Visit: Payer: Medicare Other | Attending: Internal Medicine | Admitting: Internal Medicine

## 2023-12-14 VITALS — BP 201/100 | HR 78 | Ht 61.5 in | Wt 112.0 lb

## 2023-12-14 DIAGNOSIS — I7121 Aneurysm of the ascending aorta, without rupture: Secondary | ICD-10-CM | POA: Insufficient documentation

## 2023-12-14 DIAGNOSIS — I10 Essential (primary) hypertension: Secondary | ICD-10-CM | POA: Insufficient documentation

## 2023-12-14 DIAGNOSIS — I160 Hypertensive urgency: Secondary | ICD-10-CM | POA: Diagnosis not present

## 2023-12-14 MED ORDER — VALSARTAN 320 MG PO TABS: 320.0000 mg | ORAL_TABLET | Freq: Every day | ORAL | 3 refills | Status: DC

## 2023-12-14 MED ORDER — HYDRALAZINE HCL 50 MG PO TABS: 50.0000 mg | ORAL_TABLET | Freq: Three times a day (TID) | ORAL | 3 refills | Status: DC

## 2023-12-14 NOTE — Progress Notes (Signed)
Cardiology Office Note:  .    Date:  12/14/2023  ID:  Cynthia Burns, DOB Nov 09, 1946, MRN [id number] PCP: Cynthia Burns, Cynthia Congress, DO  [facility] Providers Cardiologist:  Cynthia Haws, MD     CC: DOD HTN urgency  History of Present Illness: .    Cynthia Burns is a 77 y.o. female with HTN, CKD, and borderline dilation of the SOV.  Cynthia Burns a 77 year old individual with a history of sinus of Valsalva dilation, presents with persistently high blood pressure. She reports experiencing increased pressure in both arms, which has been a concern for some time. The patient has been monitoring her blood pressure at home, with readings consistently high, even reaching 215 at one point. However, she also notes that her blood pressure has occasionally decreased to around 155/81.  The patient has been on Losartan 50mg , which was recently increased to 100mg  by her primary care provider. She was also started on Hydralazine. Despite these interventions, her blood pressure remains elevated.  In addition to hypertension, the patient has been diagnosed with stage 3B kidney failure. She has been anemic for about a year, which led to a blood transfusion. She was also recently started on thyroid medication due to unexplained weight loss.  The patient denies experiencing any chest pain, shortness of breath, or severe headaches. She reports feeling generally well, despite the high blood pressure readings. She has not been tested for other causes of high blood pressure beyond essential hypertension.  Given high BP contemplative of ED visit, but lack of sx lead to visit.   Relevant histories: .  Social comes with husband ROS: As per HPI.   Studies Reviewed: .   Cardiac Studies & Procedures      ECHOCARDIOGRAM  ECHOCARDIOGRAM COMPLETE 11/24/2018  Narrative *[facility]* [address] [place], [place]  [address] [phone]  ------------------------------------------------------------------- Transthoracic Echocardiography  Patient:    Cynthia, Burns MR #:       [id number] Study Date: 11/24/2018 Gender:     F Age:        71 Height:     157.5 cm Weight:     64.8 kg BSA:        1.7 m^2 Pt. Status: Room:  Layla Maw, M.D. REFERRING    Cynthia Burns, M.D. SONOGRAPHER  Clearence Ped, RCS ATTENDING    Tobias Alexander, M.D. 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  ------------------------------------------------------------------- History:   PMH:  HLD.  Risk factors:  Hypertension. Diabetes mellitus.  ------------------------------------------------------------------- Study Conclusions  - Left ventricle: The cavity size was normal. There was mild concentric hypertrophy. Systolic function was vigorous. The estimated ejection fraction was in the range of 65% to 70%. Wall motion was normal; there were no regional wall motion abnormalities. Doppler parameters are consistent with abnormal left ventricular relaxation (grade 1 diastolic dysfunction). Doppler parameters are consistent with elevated ventricular end-diastolic filling pressure. - Aortic valve: Trileaflet; normal thickness leaflets. - Aortic root: The aortic root was normal in size. - Left atrium: The atrium was normal in size. - Right ventricle: Systolic function was normal. - Tricuspid valve: There was mild regurgitation. - Pulmonary arteries: Systolic pressure was within the normal range. - Inferior vena cava: The vessel was normal in size. The respirophasic diameter changes were in the normal range (= 50%), consistent with  normal central venous pressure. - Pericardium, extracardiac: There  was no pericardial effusion.  Impressions:  - Unchanged size of the aortic root at the sinus of Valsalva level (38 mm) and ascending aorta level (31 mm).  ------------------------------------------------------------------- Study data:  Comparison was made to the study of 10/10/2016.  Study status:  Routine.  Procedure:  The patient reported no pain pre or post test. Transthoracic echocardiography. Image quality was adequate.          Transthoracic echocardiography.  M-mode, complete 2D, spectral Doppler, and color Doppler.  Birthdate: Patient birthdate: [birth year].  Age:  Patient is 77 yr old.  Sex: Gender: female.    BMI: 26.1 kg/m^2.  Blood pressure:     167/60 Patient status:  Outpatient.  Study date:  Study date: 11/24/2018. Study time: 10:39 AM.  Location:  [facility]  -------------------------------------------------------------------  ------------------------------------------------------------------- Left ventricle:  The cavity size was normal. There was mild concentric hypertrophy. Systolic function was vigorous. The estimated ejection fraction was in the range of 65% to 70%. Wall motion was normal; there were no regional wall motion abnormalities. Doppler parameters are consistent with abnormal left ventricular relaxation (grade 1 diastolic dysfunction). Doppler parameters are consistent with elevated ventricular end-diastolic filling pressure.  ------------------------------------------------------------------- Aortic valve:   Trileaflet; normal thickness leaflets. Mobility was not restricted.  Doppler:  Transvalvular velocity was within the normal range. There was no stenosis. There was no regurgitation.  ------------------------------------------------------------------- Aorta:  Aortic root: The aortic root was normal in size. Ascending aorta: The ascending aorta was normal in size.  ------------------------------------------------------------------- Mitral  valve:   Structurally normal valve.   Mobility was not restricted.  Doppler:  Transvalvular velocity was within the normal range. There was no evidence for stenosis. There was trivial regurgitation.    Peak gradient (D): 2 mm Hg.  ------------------------------------------------------------------- Left atrium:  The atrium was normal in size.  ------------------------------------------------------------------- Right ventricle:  The cavity size was normal. Wall thickness was normal. Systolic function was normal.  ------------------------------------------------------------------- Pulmonic valve:    Doppler:  Transvalvular velocity was within the normal range. There was no evidence for stenosis. There was trivial regurgitation.  ------------------------------------------------------------------- Tricuspid valve:   Structurally normal valve.    Doppler: Transvalvular velocity was within the normal range. There was mild regurgitation.  ------------------------------------------------------------------- Pulmonary artery:   The main pulmonary artery was normal-sized. Systolic pressure was within the normal range.  ------------------------------------------------------------------- Right atrium:  The atrium was normal in size.  ------------------------------------------------------------------- Pericardium:  There was no pericardial effusion.  ------------------------------------------------------------------- Systemic veins: Inferior vena cava: The vessel was normal in size. The respirophasic diameter changes were in the normal range (= 50%), consistent with normal central venous pressure. Diameter: 16 mm.  ------------------------------------------------------------------- Measurements  IVC                                        Value        Reference ID                                         16    mm     ----------  Left ventricle                             Value  Reference LV ID, ED, PLAX chordal            (L)     39.9  mm     43 - 52 LV ID, ES, PLAX chordal            (L)     20.9  mm     23 - 38 LV fx shortening, PLAX chordal             48    %      >=29 LV PW thickness, ED                        13.9  mm     ---------- IVS/LV PW ratio, ED                        0.7          <=1.3 Stroke volume, 2D                          81    ml     ---------- Stroke volume/bsa, 2D                      48    ml/m^2 ---------- LV e&', lateral                             6.96  cm/s   ---------- LV E/e&', lateral                           10.88        ---------- LV e&', medial                              5.22  cm/s   ---------- LV E/e&', medial                            14.5         ---------- LV e&', average                             6.09  cm/s   ---------- LV E/e&', average                           12.43        ----------  Ventricular septum                         Value        Reference IVS thickness, ED                          9.73  mm     ----------  LVOT                                       Value        Reference LVOT ID, S  21    mm     ---------- LVOT area                                  3.46  cm^2   ---------- LVOT peak velocity, S                      87.9  cm/s   ---------- LVOT mean velocity, S                      56.9  cm/s   ---------- LVOT VTI, S                                23.3  cm     ----------  Aorta                                      Value        Reference Aortic root ID, ED                         34    mm     ----------  Left atrium                                Value        Reference LA ID, A-P, ES                             34    mm     ---------- LA ID/bsa, A-P                             2     cm/m^2 <=2.2 LA volume, S                               47.5  ml     ---------- LA volume/bsa, S                           28    ml/m^2 ---------- LA volume, ES, 1-p A4C                      34.4  ml     ---------- LA volume/bsa, ES, 1-p A4C                 20.2  ml/m^2 ---------- LA volume, ES, 1-p A2C                     56.2  ml     ---------- LA volume/bsa, ES, 1-p A2C                 33.1  ml/m^2 ----------  Mitral valve                               Value        Reference Mitral  E-wave peak velocity                75.7  cm/s   ---------- Mitral A-wave peak velocity                91.2  cm/s   ---------- Mitral deceleration time           (H)     236   ms     150 - 230 Mitral peak gradient, D                    2     mm Hg  ---------- Mitral E/A ratio, peak                     0.8          ----------  Pulmonary arteries                         Value        Reference PA pressure, S, DP                         18    mm Hg  <=30  Tricuspid valve                            Value        Reference Tricuspid regurg peak velocity             192   cm/s   ---------- Tricuspid peak RV-RA gradient              15    mm Hg  ---------- Tricuspid maximal regurg velocity,         192   cm/s   ---------- PISA  Right atrium                               Value        Reference RA ID, S-I, ES, A4C                        45.2  mm     34 - 49 RA area, ES, A4C                           11.4  cm^2   8.3 - 19.5 RA volume, ES, A/L                         23.6  ml     ---------- RA volume/bsa, ES, A/L                     13.9  ml/m^2 ----------  Systemic veins                             Value        Reference Estimated CVP                              3     mm Hg  ----------  Right ventricle  Value        Reference TAPSE                                      19.1  mm     ---------- RV pressure, S, DP                         18    mm Hg  <=30 RV s&', lateral, S                          7.94  cm/s   ----------  Legend: (L)  and  (H)  mark values outside specified reference range.  ------------------------------------------------------------------- Prepared and  Electronically Authenticated by  Tobias Alexander, M.D. 2019-12-04T15:05:21    CT SCANS  CT CARDIAC SCORING (SELF PAY ONLY) 06/09/2023  Addendum 06/10/2023 11:34 AM ADDENDUM REPORT: 06/10/2023 11:32  EXAM: OVER-READ INTERPRETATION  CT CHEST  The following report is an over-read performed by radiologist Dr. Narda Rutherford of [facility] on 06/10/2023. This over-read does not include interpretation of cardiac or coronary anatomy or pathology. The coronary calcium score interpretation by the cardiologist is attached.  COMPARISON:  Cardiac CT 12/01/2013  FINDINGS: Vascular: Minor aortic atherosclerosis. The included aorta is normal in caliber.  Mediastinum/nodes: No adenopathy or mass. Unremarkable esophagus.  Lungs: No focal airspace disease. No pulmonary nodule. No pleural fluid. The included airways are patent.  Upper abdomen: No acute or unexpected findings.  Musculoskeletal: There are no acute or suspicious osseous abnormalities.  IMPRESSION: Aortic Atherosclerosis (ICD10-I70.0).   Electronically Signed By: Narda Rutherford M.D. On: 06/10/2023 11:32  Narrative CLINICAL DATA:  Cardiovascular Disease Risk stratification  EXAM: Coronary Calcium Score  TECHNIQUE: A gated, non-contrast computed tomography scan of the heart was performed using 3mm slice thickness. Axial images were analyzed on a dedicated workstation. Calcium scoring of the coronary arteries was performed using the Agatston method.  FINDINGS: Coronary Calcium Score:  Left main: 22.6  Left anterior descending artery: 0  Left circumflex artery: 0  Right coronary artery: 0  Total: 22.6  Percentile: 40th  Pericardium: Normal.  Non-cardiac: See separate report from [facility].  IMPRESSION: 1. Coronary calcium score of 22.6. This was 40th percentile for age-, race-, and sex-matched controls.  RECOMMENDATIONS: Coronary artery calcium (CAC) score is a strong  predictor of incident coronary heart disease (CHD) and provides predictive information beyond traditional risk factors. CAC scoring is reasonable to use in the decision to withhold, postpone, or initiate statin therapy in intermediate-risk or selected borderline-risk asymptomatic adults (age 48-75 years and LDL-C >=70 to <190 mg/dL) who do not have diabetes or established atherosclerotic cardiovascular disease (ASCVD).* In intermediate-risk (10-year ASCVD risk >=7.5% to <20%) adults or selected borderline-risk (10-year ASCVD risk >=5% to <7.5%) adults in whom a CAC score is measured for the purpose of making a treatment decision the following recommendations have been made:  If CAC=0, it is reasonable to withhold statin therapy and reassess in 5 to 10 years, as long as higher risk conditions are absent (diabetes mellitus, family history of premature CHD in first degree relatives (males <55 years; females <65 years), cigarette smoking, or LDL >=190 mg/dL).  If CAC is 1 to 99, it is reasonable to initiate statin therapy for patients >=65 years of age.  If CAC is >=100 or >=75th percentile, it  is reasonable to initiate statin therapy at any age.  Cardiology referral should be considered for patients with CAC scores >=400 or >=75th percentile.  *2018 AHA/ACC/AACVPR/AAPA/ABC/ACPM/ADA/AGS/APhA/ASPC/NLA/PCNA Guideline on the Management of Blood Cholesterol: A Report of the American College of Cardiology/American Heart Association Task Force on Clinical Practice Guidelines. J Am Coll Cardiol. 2019;73(24):3168-3209.  Lennie Odor, MD  Electronically Signed: By: Lennie Odor M.D. On: 06/09/2023 20:14   CT SCANS  CT CORONARY MORPH W/CTA COR W/SCORE 12/01/2013  Addendum 12/01/2013  4:13 PM ADDENDUM REPORT: 12/01/2013 16:10  ADDENDUM: OVER-READ INTERPRETATION  CT CHEST  The following report is an over-read performed by radiologist Dr. [facility] on  Creation date. This over-read does not include interpretation of cardiac or coronary anatomy or pathology. The CTA interpretation by the cardiologist is attached.  No pleural effusions identified. The lungs are clear. No airspace consolidation. No pulmonary nodule or mass identified. The the airway appears patent. The heart size appears normal. No pericardial effusion. No mediastinal or hilar adenopathy identified.  No axillary adenopathy identified. Incidental imaging through the upper abdomen shows no acute findings. Review of the visualized bony structures is significant for mild thoracic spondylosis.  IMPRESSION: No acute findings.   Electronically Signed By: Signa Kell M.D. On: 12/01/2013 16:10  Narrative CLINICAL DATA:  Dilated Sinus of Valsalva  EXAM: Cardiac CTA  MEDICATIONS: Sub lingual nitro. 4mg  and lopressor 5mg   TECHNIQUE: The patient was scanned on a Philips 256 slice scanner. Gantry rotation speed was 270 msecs. Collimation was .9mm. A 100 kV retrospective scan was triggered in the descending thoracic aorta at 111 HU's with 10% R-R reconstructions throughout the cardiac cycle Average HR during the scan was 64 bpm. The 3D data set was interpreted on a dedicated work station using MPR, MIP and VRT modes. A total of 80cc of contrast was used.  FINDINGS: Non-cardiac: See separate report from [facility]. No significant findings on limited lung and soft tissue windows.  Aorta: Normal ascending aorta, arch and descending thoracic aorta. Bovine arch Aortic valve trileaflet  Sinotubular Junction:  2.6 cm Ascending Aorta:  2.7 cm  Arch:  2.0 cm  Descending Thoracic Aorta:  2.0 cm  There was dilatation of the non coronary sinus. Traditional lateral echo view measures 3.7 cm from left to non coronary sinus  Short Axis Measurements  Noncoronary Sinus:  3.5 cm  Left coronary Sinus:  3.2 cm  Right coronary Sinus:  3.2  cm  Height  Noncoronary Sinus:  2.7 cm  Left coronary Sinus:  1.8 cm  Right coronary Sinus:  2.1 cm  Calcium Score:  0  Coronary Arteries:  Left  dominant with no anomalies  LM: Normal  LAD:  Normal  IM:  Large Intermediate branch normal  Circumflex:  Left dominant and normal  OM1: Normal  OM2: Normal  PDA: Normal  PLA: Normal  RCA:  Nondominant and Normal  IMPRESSION:  1) Normal left dominant coronary arteries  2) Calcium Score 0  3) Normal ascending aorta with bovine arch 2.7 cm  4) Trileaflet Aortic Valve  5) 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 On: 12/01/2013 15:45          Physical Exam:    VS:  BP (!) 201/100 (BP Location: Right Arm)  Pulse 78   Ht 5' 1.5" (1.562 m)   Wt 112 lb (50.8 kg)   SpO2 98%   BMI 20.82 kg/m    Wt Readings from Last 3 Encounters:  12/14/23 112 lb (50.8 kg)  12/08/23 112 lb 6.4 oz (51 kg)  12/03/23 111 lb 6.4 oz (50.5 kg)    Gen: no distress   Neck: No JVD Cardiac: No Rubs or Gallops, no Murmur, heart rate 78, regular, +2radial pulses Respiratory: Clear to auscultation bilaterally, normal effort, normal  respiratory rate GI: Soft, nontender, non-distended  MS: No  edema;  moves all extremities Integument: Skin feels warm Neuro:  At time of evaluation, alert and oriented to person/place/time/situation  Psych: Normal affect, patient feels well   ASSESSMENT AND PLAN: .    Hypertension Long-standing hypertension with recent significantly elevated readings. No symptoms of chest pain, dyspnea, or severe headache. Consistently high blood pressure readings at home and in clinic. No prior testing for secondary causes. Discussed risks of uncontrolled hypertension, including stroke and myocardial infarction. Explained  benefits of switching from losartan to valsartan and increasing hydralazine. Plan to investigate secondary causes, including renal artery stenosis and Kahn syndrome. Emphasized gradual approach to avoid rapid blood pressure changes. - Switch from losartan to valsartan 320 mg - Increase hydralazine to 50 mg TID next week - Order renal artery duplex scan - Order renin and aldosterone levels at follow-up visit - Consider starting spironolactone based on test results  Chronic Kidney Disease Stage 3B CKD Stage 3B with GFR around 29.2-29.6. Recent labs show well-managed kidney function. Anemia noted for about a year. Recent change in diabetes medication due to kidney function. Discussed benefits of starting Farxiga to improve kidney function and slow CKD progression. Explained risk of urinary tract infections and importance of maintaining hygiene. Goal to delay dialysis. 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  General Health Maintenance Emphasized importance of managing blood pressure and kidney function to delay dialysis. Discussed risks and benefits of new medications. - Educate on maintaining hygiene to prevent urinary tract infections - Monitor blood pressure regularly - Follow up with primary care for ongoing management - we discussed ED evaluation- she denies CP, SOB, HA or high risk features of hypertensive emergency, discussed ED precautions with her and husband; they will continue BP log  Follow-up - Follow up with Dr. Eden Emms on January 8th   Riley Lam, MD FASE [facility] Cardiologist [facility]  [address] [place], [address] [place], [place] [address] [phone]  5:35 PM

## 2023-12-14 NOTE — Patient Instructions (Addendum)
Medication Instructions:  Your physician has recommended you make the following change in your medication:  STOP: losartan  START: Valsartan 320 mg by mouth once daily  NEXT WEEK: Increase hydralazine to 50 mg by mouth 3 times daily  *If you need a refill on your cardiac medications before your next appointment, please call your pharmacy*   Lab Work: AT NEXT Office VISIT : renin aldosterone   If you have labs (blood work) drawn today and your tests are completely normal, you will receive your results only by: MyChart Message (if you have MyChart) OR A paper copy in the mail If you have any lab test that is abnormal or we need to change your treatment, we will call you to review the results.   Testing/Procedures: Your physician has requested that you have a renal artery duplex. During this test, an ultrasound is used to evaluate blood flow to the kidneys. Allow one hour for this exam. Do not eat after midnight the day before and avoid carbonated beverages. Take your medications as you usually do.    Follow-Up:As scheduled  At [facility], you and your health needs are our priority.  As part of our continuing mission to provide you with exceptional heart care, we have created designated Provider Care Teams.  These Care Teams include your primary Cardiologist (physician) and Advanced Practice Providers (APPs -  Physician Assistants and Nurse Practitioners) who all work together to provide you with the care you need, when you need it.    Provider:   Charlton Haws, MD

## 2023-12-15 ENCOUNTER — Telehealth: Payer: Self-pay

## 2023-12-15 MED ORDER — DAPAGLIFLOZIN PROPANEDIOL 10 MG PO TABS: 10.0000 mg | ORAL_TABLET | Freq: Every day | ORAL | 1 refills | Status: DC

## 2023-12-15 NOTE — Telephone Encounter (Signed)
Copied from CRM [id number]. Topic: Clinical - Medical Advice >> Dec 15, 2023  9:05 AM Efraim Kaufmann C wrote: Reason for CRM: MD left voicemail for patient wanting her to start new medication Farxiga(sp?) 10mg - patient would like MD to call new med into [facility] this morning so she can start ASAP per her recommendations.

## 2023-12-15 NOTE — Addendum Note (Signed)
Addended by: Conrad [place] D on: 12/15/2023 11:13 AM   Modules accepted: Orders

## 2023-12-15 NOTE — Telephone Encounter (Signed)
Rx sent 

## 2023-12-21 ENCOUNTER — Telehealth: Payer: Self-pay | Admitting: Cardiovascular Disease

## 2023-12-21 NOTE — Progress Notes (Signed)
 [name], female   DOB: 26-May-1946, 77 y.o.   MRN: [id number]     Cynthia Burns returns today for followup. 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 She is anemic with some renal insufficiency Losartan  increased to 100 mg and started on hydralazine  He changed her to valsartan  320 mg and made her hydralazine  50 mg tid to consider adding aldactone Started on Farxiga  for renal protection    LDL 24 TSH 1.14 A1c 6.2 BUN 33 Cr 1.67 K 4.1 labs 12/08/23   Hct as low as 20.8 11/26/23 and transfused with last Hct 12/03/23 improved 31.4   Lots of non cardiac questions regarding her anemia and renal function. She has appropriate f/u for these with renal duplex, nephrology consult, GI consult, EGD and colonoscopy scheduled in next 6-8 weeks   ROS: Denies fever, malais, weight loss, blurry vision, decreased visual acuity, cough, sputum, SOB, hemoptysis, pleuritic pain, palpitaitons, heartburn, abdominal pain, melena, lower extremity edema, claudication, or rash.  All other systems reviewed and negative  General: BP 138/72 (BP Location: Left Arm, Patient Position: Sitting)   Pulse 81   Resp 16   Ht 5' 1 (1.549 m)   Wt 109 lb 3.2 oz (49.5 kg)   BMI 20.63 kg/m  Affect appropriate Healthy:  appears stated age HEENT: normal Neck supple with no adenopathy JVP normal no bruits no  thyromegaly Lungs clear with no wheezing and good diaphragmatic motion Heart:  S1/S2 no murmur, no rub, gallop or click PMI normal Abdomen: benighn, post lap choly  Distal pulses intact with no bruits No edema Neuro non-focal Skin warm and dry No muscular weakness    Current Outpatient Medications  Medication Sig Dispense Refill   aspirin  EC 81 MG tablet Take 1 tablet (81 mg total) by mouth daily. 90 tablet 3   BIOTIN PO Take by mouth.     Blood Glucose Monitoring Suppl (ONE TOUCH ULTRA 2) w/Device KIT CHECK BLOOD SUGAR TWICE DAILY.  DX CODE  E11.9 1 kit 0   dapagliflozin  propanediol (FARXIGA ) 10 MG TABS tablet Take 1 tablet (10 mg total) by mouth daily before breakfast. 30 tablet 1   furosemide  (LASIX ) 20 MG tablet TAKE ONE-HALF TABLET BY MOUTH EVERY DAY 45 tablet 3   glucose blood (ONETOUCH ULTRA TEST) test strip Use as instructed 200 each 3   Lancets (ONETOUCH DELICA PLUS LANCET33G) MISC USE 1 LANCET TO TEST AS DIRECTED (DISCARD LANCET IN SHARPS CONTAINER IMMEDIATELY AFTER USE) 200 each 1   levothyroxine  (SYNTHROID ) 25 MCG tablet TAKE ONE TABLET BY MOUTH EVERY DAY BEFORE BREAKFAST 90 tablet 1   nebivolol  (BYSTOLIC ) 5 MG tablet TAKE ONE TABLET BY MOUTH EVERY DAY 90 tablet 2   rosuvastatin  (CRESTOR ) 20 MG tablet TAKE ONE TABLET BY MOUTH EVERY DAY 90 tablet 1  Semaglutide  (RYBELSUS ) 7 MG TABS Take 1 tablet (7 mg total) by mouth daily. 90 tablet 1   valsartan  (DIOVAN ) 320 MG tablet Take 1 tablet (320 mg total) by mouth daily. 90 tablet 3   amoxicillin  (AMOXIL ) 500 MG capsule Take 500 mg by mouth. When going to the dentist (Patient not taking: Reported on 12/30/2023)     CALCIUM  PO Take by mouth. 1-2 times a week (Patient not taking: Reported on 12/30/2023)     Cyanocobalamin  (VITAMIN B-12 PO) Take 1 tablet by mouth daily.  (Patient not taking: Reported on 12/30/2023)     ferrous sulfate 324 MG TBEC Take 324 mg by mouth. (Patient not taking: Reported on 12/30/2023)     fexofenadine (ALLEGRA)  180 MG tablet Take 180 mg by mouth daily. (Patient not taking: Reported on 12/30/2023)     magnesium oxide (MAG-OX) 400 MG tablet Take 400 mg by mouth daily. (Patient not taking: Reported on 12/30/2023)     Multiple Vitamin (MULTIVITAMIN PO) Take by mouth. With iron (Patient not taking: Reported on 12/30/2023)     Multiple Vitamins-Minerals (PRESERVISION AREDS 2) CAPS Take by mouth. (Patient not taking: Reported on 12/30/2023)     VITAMIN D , CHOLECALCIFEROL, PO Take by mouth daily. D3 (Patient not taking: Reported on 12/30/2023)     ZINC-VITAMIN C PO Take 500 mg by mouth daily. (Patient not taking: Reported on 12/30/2023)     No current facility-administered medications for this visit.    Allergies  Mucinex [guaifenesin er], Codeine, Paxlovid  [nirmatrelvir -ritonavir ], Advicor [niacin-lovastatin er], Amlodipine, Lisinopril-hydrochlorothiazide, Other, Ramipril, and Sulfonamide derivatives  Electrocardiogram:    12/30/2023 NSR rate 69 normal 8 nonspecific ST changes  Assessment and Plan SVA: Dilatation is in sinus not root 3.8 cm by TTE 11/24/18 observe   DM: Discussed low carb diet.  Continue current medications. A1c 6.3  Obesity: Exercise and low carb diet discussed Chol: on statin LDL 29 02/23/23 at goal Discussed utility of calcium  score HTN:  Recent spike in setting of worsening renal function and anemia On Valsartan  and hydralazine   Ok to hold am dose of hydralazine   Edema: stable   GB: post lap choly with improved symptoms  Hypothyroid:  on synthroid  replacement TSH 1.14 12/03/23  A/CRF:  Cr 1.6 F/U primary on Farxiga . Renal duplex pending  Anemia:  related to above post transfusion Hct improved 31.4   CAD:  subclinical calcium  score 22.6 still below average for age On statin with LDL at goal    F/U in a year   Maude Emmer

## 2023-12-21 NOTE — Telephone Encounter (Signed)
Pt c/o medication issue:  1. Name of Medication   hydrALAZINE (APRESOLINE) 50 MG tablet    2. How are you currently taking this medication (dosage and times per day)? As directed  3. Are you having a reaction (difficulty breathing--STAT)? no  4. What is your medication issue? Pt was suppose to stop taking if bp reached a certain point. Pt has questions

## 2023-12-21 NOTE — Telephone Encounter (Signed)
Spoke with patient and husband and patient has started hydralaziane 50 mg TID this morning  She states you advised if SBP is at 140 to stop hydralazine 50 mg. She has had one SBP at 140 and this morning it was 144. Can you clarify the parameters you gave them. They are asking if they should stop hydralazine 50 mg and go back to 25 mg.   Your note only says to start hydralazine 50 mg TID next week

## 2023-12-24 NOTE — Telephone Encounter (Signed)
 Called pt to f/u on hydralazine  usage.  Advised pt to continue Valsartan  320 mg PO every day and hydralazine  50 mg PO TID.  Advised if BP drops below 110 and pt feels dizzy or light headed to eat a small salty snack, increase fluid intake and notify our office.   Pt reports the following BP readings: 12/21/23: AM 144/71-76 PM 148/78-74 1/1 AM 139/75-70 PM 180/86-61 1/2 AM 130/67-72 Spouse notes BP elevated at night pt will take Valsartan  at lunch time instead of in the morning to see if it makes a difference. Pt scheduled to see Dr. Delford on 12/29/22 advised to continue to monitor BP and contact our office with further concerns.  Pt and spouse express understanding.

## 2023-12-25 ENCOUNTER — Telehealth: Payer: Self-pay

## 2023-12-25 NOTE — Telephone Encounter (Signed)
 Copied from CRM [id number]. Topic: Clinical - Medication Question >> Dec 25, 2023  9:33 AM Corin V wrote: Reason for CRM: Patient called in asking to speak to Dr. Jennefer nurse regarding ongoing issues with her blood pressure medication. She needs to also make sure her pharmacy is put in as the [facility].

## 2023-12-25 NOTE — Telephone Encounter (Signed)
 Spoke with patient. Pt just wanted pharmacy has [facility] only

## 2023-12-30 ENCOUNTER — Encounter: Payer: Self-pay | Admitting: Cardiovascular Disease

## 2023-12-30 ENCOUNTER — Telehealth: Payer: Self-pay

## 2023-12-30 ENCOUNTER — Ambulatory Visit: Payer: Medicare Other | Attending: Cardiovascular Disease | Admitting: Cardiovascular Disease

## 2023-12-30 VITALS — BP 138/72 | HR 81 | Resp 16 | Ht 61.0 in | Wt 109.2 lb

## 2023-12-30 DIAGNOSIS — D500 Iron deficiency anemia secondary to blood loss (chronic): Secondary | ICD-10-CM | POA: Insufficient documentation

## 2023-12-30 DIAGNOSIS — I10 Essential (primary) hypertension: Secondary | ICD-10-CM | POA: Diagnosis not present

## 2023-12-30 DIAGNOSIS — I7121 Aneurysm of the ascending aorta, without rupture: Secondary | ICD-10-CM | POA: Diagnosis not present

## 2023-12-30 DIAGNOSIS — N1832 Chronic kidney disease, stage 3b: Secondary | ICD-10-CM | POA: Diagnosis not present

## 2023-12-30 MED ORDER — DAPAGLIFLOZIN PROPANEDIOL 10 MG PO TABS: 10.0000 mg | ORAL_TABLET | Freq: Every day | ORAL | 2 refills | Status: DC

## 2023-12-30 MED ORDER — HYDRALAZINE HCL 50 MG PO TABS: 50.0000 mg | ORAL_TABLET | Freq: Three times a day (TID) | ORAL | 3 refills | Status: DC

## 2023-12-30 NOTE — Patient Instructions (Signed)
Medication Instructions:  Your physician recommends that you continue on your current medications as directed. Please refer to the Current Medication list given to you today.  *If you need a refill on your cardiac medications before your next appointment, please call your pharmacy*  Lab Work: If you have labs (blood work) drawn today and your tests are completely normal, you will receive your results only by: MyChart Message (if you have MyChart) OR A paper copy in the mail If you have any lab test that is abnormal or we need to change your treatment, we will call you to review the results.  Follow-Up: At [facility], you and your health needs are our priority.  As part of our continuing mission to provide you with exceptional heart care, we have created designated Provider Care Teams.  These Care Teams include your primary Cardiologist (physician) and Advanced Practice Providers (APPs -  Physician Assistants and Nurse Practitioners) who all work together to provide you with the care you need, when you need it.  We recommend signing up for the patient portal called "MyChart".  Sign up information is provided on this After Visit Summary.  MyChart is used to connect with patients for Virtual Visits (Telemedicine).  Patients are able to view lab/test results, encounter notes, upcoming appointments, etc.  Non-urgent messages can be sent to your provider as well.   To learn more about what you can do with MyChart, go to [email or web address].    Your next appointment:   1 year(s)  Provider:   Peter Nishan, MD     

## 2023-12-30 NOTE — Telephone Encounter (Signed)
 Rx sent    Copied from CRM [id number]. Topic: Clinical - Medication Refill >> Dec 30, 2023  3:18 PM Deidre DASEN wrote: Most Recent Primary Care Visit:  Provider: ANTONIO CYNDEE ROCKERS R  Department: [facility]  Visit Type: OFFICE VISIT  Date: 12/08/2023  Medication: dapagliflozin  propanediol (FARXIGA ) 10 MG TABS tablet [[id number]]  Has the patient contacted their pharmacy? Yes (Agent: If no, request that the patient contact the pharmacy for the refill. If patient does not wish to contact the pharmacy document the reason why and proceed with request.) (Agent: If yes, when and what did the pharmacy advise?)  Is this the correct pharmacy for this prescription? Yes If no, delete pharmacy and type the correct one.  This is the patient's preferred pharmacy:  [facility] - [place], [place] - [address] [facility] [address] [place] [address] [place] [address] Phone: [phone] Fax: [phone]   Has the prescription been filled recently? No  Is the patient out of the medication? Yes  Has the patient been seen for an appointment in the last year OR does the patient have an upcoming appointment? Yes  Can we respond through MyChart? No  Agent: Please be advised that Rx refills may take up to 3 business days. We ask that you follow-up with your pharmacy.

## 2024-01-08 ENCOUNTER — Ambulatory Visit (HOSPITAL_COMMUNITY)
Admission: RE | Admit: 2024-01-08 | Discharge: 2024-01-08 | Disposition: A | Discharge status: Home / Self Care | Payer: Medicare Other | Source: Ambulatory Visit | Attending: Internal Medicine | Admitting: Internal Medicine

## 2024-01-08 DIAGNOSIS — N1832 Chronic kidney disease, stage 3b: Secondary | ICD-10-CM | POA: Diagnosis not present

## 2024-01-08 DIAGNOSIS — I160 Hypertensive urgency: Secondary | ICD-10-CM | POA: Diagnosis not present

## 2024-01-12 ENCOUNTER — Encounter: Payer: Self-pay | Admitting: Internal Medicine

## 2024-01-13 DIAGNOSIS — R809 Proteinuria, unspecified: Secondary | ICD-10-CM | POA: Diagnosis not present

## 2024-01-13 DIAGNOSIS — N184 Chronic kidney disease, stage 4 (severe): Secondary | ICD-10-CM | POA: Diagnosis not present

## 2024-01-13 DIAGNOSIS — E871 Hypo-osmolality and hyponatremia: Secondary | ICD-10-CM | POA: Diagnosis not present

## 2024-01-13 DIAGNOSIS — R3129 Other microscopic hematuria: Secondary | ICD-10-CM | POA: Diagnosis not present

## 2024-01-13 DIAGNOSIS — N179 Acute kidney failure, unspecified: Secondary | ICD-10-CM | POA: Diagnosis not present

## 2024-01-13 DIAGNOSIS — I129 Hypertensive chronic kidney disease with stage 1 through stage 4 chronic kidney disease, or unspecified chronic kidney disease: Secondary | ICD-10-CM | POA: Diagnosis not present

## 2024-01-13 DIAGNOSIS — N189 Chronic kidney disease, unspecified: Secondary | ICD-10-CM | POA: Diagnosis not present

## 2024-01-13 DIAGNOSIS — D631 Anemia in chronic kidney disease: Secondary | ICD-10-CM | POA: Diagnosis not present

## 2024-01-13 DIAGNOSIS — E1122 Type 2 diabetes mellitus with diabetic chronic kidney disease: Secondary | ICD-10-CM | POA: Diagnosis not present

## 2024-01-19 ENCOUNTER — Telehealth: Payer: Self-pay | Admitting: Gastroenterology

## 2024-01-19 NOTE — Telephone Encounter (Signed)
Asked patient to hold farxiga and rybelsus oral diabetic medications on the morning of her procedure. Advised that she should take all other medications as she typically does up until 3 hours prior to her test. Patient verbalizes understanding of this information.

## 2024-01-19 NOTE — Telephone Encounter (Signed)
Patient is requesting a call back stating is she taking Hydralazin, Farxiga, and Valsartan medication, patient is wondering if she has to hold medications

## 2024-01-29 ENCOUNTER — Encounter: Payer: Self-pay | Admitting: *Deleted

## 2024-01-29 ENCOUNTER — Other Ambulatory Visit (HOSPITAL_COMMUNITY): Payer: Self-pay | Admitting: Nephrology

## 2024-01-29 ENCOUNTER — Encounter: Payer: Self-pay | Admitting: Gastroenterology

## 2024-01-29 ENCOUNTER — Ambulatory Visit (AMBULATORY_SURGERY_CENTER): Payer: Medicare Other | Admitting: Gastroenterology

## 2024-01-29 ENCOUNTER — Inpatient Hospital Stay (HOSPITAL_COMMUNITY)
Admission: EM | Admit: 2024-01-29 | Discharge: 2024-02-02 | DRG: 841 | Disposition: A | Discharge status: Home / Self Care | Payer: Medicare Other | Attending: Internal Medicine | Admitting: Internal Medicine

## 2024-01-29 ENCOUNTER — Other Ambulatory Visit: Payer: Self-pay

## 2024-01-29 ENCOUNTER — Encounter (HOSPITAL_COMMUNITY): Payer: Self-pay

## 2024-01-29 VITALS — BP 152/67 | HR 90 | Temp 97.3°F | Resp 16 | Ht 61.5 in | Wt 111.0 lb

## 2024-01-29 DIAGNOSIS — Z79899 Other long term (current) drug therapy: Secondary | ICD-10-CM

## 2024-01-29 DIAGNOSIS — I129 Hypertensive chronic kidney disease with stage 1 through stage 4 chronic kidney disease, or unspecified chronic kidney disease: Secondary | ICD-10-CM | POA: Diagnosis present

## 2024-01-29 DIAGNOSIS — D72829 Elevated white blood cell count, unspecified: Secondary | ICD-10-CM | POA: Diagnosis not present

## 2024-01-29 DIAGNOSIS — Z79890 Hormone replacement therapy (postmenopausal): Secondary | ICD-10-CM

## 2024-01-29 DIAGNOSIS — I10 Essential (primary) hypertension: Secondary | ICD-10-CM | POA: Diagnosis not present

## 2024-01-29 DIAGNOSIS — R634 Abnormal weight loss: Secondary | ICD-10-CM | POA: Diagnosis present

## 2024-01-29 DIAGNOSIS — E1122 Type 2 diabetes mellitus with diabetic chronic kidney disease: Secondary | ICD-10-CM | POA: Diagnosis present

## 2024-01-29 DIAGNOSIS — Z87892 Personal history of anaphylaxis: Secondary | ICD-10-CM

## 2024-01-29 DIAGNOSIS — Z8616 Personal history of COVID-19: Secondary | ICD-10-CM | POA: Diagnosis not present

## 2024-01-29 DIAGNOSIS — K319 Disease of stomach and duodenum, unspecified: Secondary | ICD-10-CM

## 2024-01-29 DIAGNOSIS — E785 Hyperlipidemia, unspecified: Secondary | ICD-10-CM | POA: Diagnosis present

## 2024-01-29 DIAGNOSIS — D638 Anemia in other chronic diseases classified elsewhere: Secondary | ICD-10-CM | POA: Diagnosis present

## 2024-01-29 DIAGNOSIS — Z888 Allergy status to other drugs, medicaments and biological substances status: Secondary | ICD-10-CM

## 2024-01-29 DIAGNOSIS — K2100 Gastro-esophageal reflux disease with esophagitis, without bleeding: Secondary | ICD-10-CM | POA: Diagnosis not present

## 2024-01-29 DIAGNOSIS — E039 Hypothyroidism, unspecified: Secondary | ICD-10-CM | POA: Diagnosis present

## 2024-01-29 DIAGNOSIS — R778 Other specified abnormalities of plasma proteins: Secondary | ICD-10-CM

## 2024-01-29 DIAGNOSIS — D720 Genetic anomalies of leukocytes: Secondary | ICD-10-CM | POA: Diagnosis not present

## 2024-01-29 DIAGNOSIS — K449 Diaphragmatic hernia without obstruction or gangrene: Secondary | ICD-10-CM

## 2024-01-29 DIAGNOSIS — M8580 Other specified disorders of bone density and structure, unspecified site: Secondary | ICD-10-CM | POA: Diagnosis present

## 2024-01-29 DIAGNOSIS — C9000 Multiple myeloma not having achieved remission: Principal | ICD-10-CM

## 2024-01-29 DIAGNOSIS — Z833 Family history of diabetes mellitus: Secondary | ICD-10-CM

## 2024-01-29 DIAGNOSIS — Z8249 Family history of ischemic heart disease and other diseases of the circulatory system: Secondary | ICD-10-CM

## 2024-01-29 DIAGNOSIS — K5730 Diverticulosis of large intestine without perforation or abscess without bleeding: Secondary | ICD-10-CM

## 2024-01-29 DIAGNOSIS — D75829 Heparin-induced thrombocytopenia, unspecified: Secondary | ICD-10-CM | POA: Diagnosis not present

## 2024-01-29 DIAGNOSIS — Z9049 Acquired absence of other specified parts of digestive tract: Secondary | ICD-10-CM

## 2024-01-29 DIAGNOSIS — Z9889 Other specified postprocedural states: Secondary | ICD-10-CM

## 2024-01-29 DIAGNOSIS — N184 Chronic kidney disease, stage 4 (severe): Secondary | ICD-10-CM | POA: Diagnosis not present

## 2024-01-29 DIAGNOSIS — N179 Acute kidney failure, unspecified: Secondary | ICD-10-CM

## 2024-01-29 DIAGNOSIS — E871 Hypo-osmolality and hyponatremia: Secondary | ICD-10-CM | POA: Diagnosis present

## 2024-01-29 DIAGNOSIS — K644 Residual hemorrhoidal skin tags: Secondary | ICD-10-CM | POA: Diagnosis not present

## 2024-01-29 DIAGNOSIS — F411 Generalized anxiety disorder: Secondary | ICD-10-CM | POA: Diagnosis present

## 2024-01-29 DIAGNOSIS — D649 Anemia, unspecified: Secondary | ICD-10-CM

## 2024-01-29 DIAGNOSIS — Z90710 Acquired absence of both cervix and uterus: Secondary | ICD-10-CM

## 2024-01-29 DIAGNOSIS — D631 Anemia in chronic kidney disease: Secondary | ICD-10-CM | POA: Diagnosis present

## 2024-01-29 DIAGNOSIS — Z882 Allergy status to sulfonamides status: Secondary | ICD-10-CM

## 2024-01-29 DIAGNOSIS — I341 Nonrheumatic mitral (valve) prolapse: Secondary | ICD-10-CM | POA: Diagnosis not present

## 2024-01-29 DIAGNOSIS — K648 Other hemorrhoids: Secondary | ICD-10-CM | POA: Diagnosis present

## 2024-01-29 DIAGNOSIS — E119 Type 2 diabetes mellitus without complications: Secondary | ICD-10-CM | POA: Diagnosis not present

## 2024-01-29 DIAGNOSIS — K2950 Unspecified chronic gastritis without bleeding: Secondary | ICD-10-CM | POA: Diagnosis not present

## 2024-01-29 DIAGNOSIS — M25571 Pain in right ankle and joints of right foot: Secondary | ICD-10-CM | POA: Diagnosis present

## 2024-01-29 DIAGNOSIS — N1832 Chronic kidney disease, stage 3b: Secondary | ICD-10-CM

## 2024-01-29 DIAGNOSIS — M51369 Other intervertebral disc degeneration, lumbar region without mention of lumbar back pain or lower extremity pain: Secondary | ICD-10-CM | POA: Diagnosis not present

## 2024-01-29 DIAGNOSIS — K2970 Gastritis, unspecified, without bleeding: Secondary | ICD-10-CM

## 2024-01-29 DIAGNOSIS — Z825 Family history of asthma and other chronic lower respiratory diseases: Secondary | ICD-10-CM

## 2024-01-29 DIAGNOSIS — N189 Chronic kidney disease, unspecified: Secondary | ICD-10-CM

## 2024-01-29 DIAGNOSIS — Z823 Family history of stroke: Secondary | ICD-10-CM

## 2024-01-29 DIAGNOSIS — N289 Disorder of kidney and ureter, unspecified: Secondary | ICD-10-CM | POA: Diagnosis not present

## 2024-01-29 DIAGNOSIS — Z7984 Long term (current) use of oral hypoglycemic drugs: Secondary | ICD-10-CM

## 2024-01-29 DIAGNOSIS — Z6822 Body mass index (BMI) 22.0-22.9, adult: Secondary | ICD-10-CM

## 2024-01-29 DIAGNOSIS — Z7982 Long term (current) use of aspirin: Secondary | ICD-10-CM

## 2024-01-29 DIAGNOSIS — D696 Thrombocytopenia, unspecified: Secondary | ICD-10-CM | POA: Diagnosis not present

## 2024-01-29 DIAGNOSIS — R809 Proteinuria, unspecified: Secondary | ICD-10-CM | POA: Diagnosis not present

## 2024-01-29 DIAGNOSIS — I253 Aneurysm of heart: Secondary | ICD-10-CM | POA: Diagnosis present

## 2024-01-29 DIAGNOSIS — N183 Chronic kidney disease, stage 3 unspecified: Secondary | ICD-10-CM | POA: Diagnosis not present

## 2024-01-29 DIAGNOSIS — Z885 Allergy status to narcotic agent status: Secondary | ICD-10-CM

## 2024-01-29 LAB — COMPREHENSIVE METABOLIC PANEL
ALT: 40 U/L (ref 0–44)
AST: 30 U/L (ref 15–41)
Albumin: 2.9 g/dL — ABNORMAL LOW (ref 3.5–5.0)
Alkaline Phosphatase: 26 U/L — ABNORMAL LOW (ref 38–126)
Anion gap: 7 (ref 5–15)
BUN: 28 mg/dL — ABNORMAL HIGH (ref 8–23)
CO2: 18 mmol/L — ABNORMAL LOW (ref 22–32)
Calcium: 9.1 mg/dL (ref 8.9–10.3)
Chloride: 104 mmol/L (ref 98–111)
Creatinine, Ser: 1.8 mg/dL — ABNORMAL HIGH (ref 0.44–1.00)
GFR, Estimated: 29 mL/min — ABNORMAL LOW (ref >60)
Glucose, Bld: 106 mg/dL — ABNORMAL HIGH (ref 70–99)
Potassium: 4.5 mmol/L (ref 3.5–5.1)
Sodium: 129 mmol/L — ABNORMAL LOW (ref 135–145)
Total Bilirubin: 0.6 mg/dL (ref 0.0–1.2)
Total Protein: >12 g/dL — ABNORMAL HIGH (ref 6.5–8.1)

## 2024-01-29 LAB — PHOSPHORUS: Phosphorus: 5.1 mg/dL — ABNORMAL HIGH (ref 2.5–4.6)

## 2024-01-29 LAB — MAGNESIUM: Magnesium: 2 mg/dL (ref 1.7–2.4)

## 2024-01-29 LAB — CBC WITH DIFFERENTIAL/PLATELET
Abs Immature Granulocytes: 0.06 10*3/uL (ref 0.00–0.07)
Basophils Absolute: 0 10*3/uL (ref 0.0–0.1)
Basophils Relative: 0 %
Eosinophils Absolute: 0 10*3/uL (ref 0.0–0.5)
Eosinophils Relative: 1 %
HCT: 23.1 % — ABNORMAL LOW (ref 36.0–46.0)
Hemoglobin: 7.5 g/dL — ABNORMAL LOW (ref 12.0–15.0)
Immature Granulocytes: 1 %
Lymphocytes Relative: 18 %
Lymphs Abs: 1.5 10*3/uL (ref 0.7–4.0)
MCH: 31.8 pg (ref 26.0–34.0)
MCHC: 32.5 g/dL (ref 30.0–36.0)
MCV: 97.9 fL (ref 80.0–100.0)
Monocytes Absolute: 0.8 10*3/uL (ref 0.1–1.0)
Monocytes Relative: 9 %
Neutro Abs: 6 10*3/uL (ref 1.7–7.7)
Neutrophils Relative %: 71 %
Platelets: 155 10*3/uL (ref 150–400)
RBC: 2.36 MIL/uL — ABNORMAL LOW (ref 3.87–5.11)
RDW: 16.5 % — ABNORMAL HIGH (ref 11.5–15.5)
WBC: 8.4 10*3/uL (ref 4.0–10.5)
nRBC: 0 % (ref 0.0–0.2)

## 2024-01-29 LAB — LACTATE DEHYDROGENASE: LDH: 142 U/L (ref 98–192)

## 2024-01-29 LAB — PREPARE RBC (CROSSMATCH)

## 2024-01-29 LAB — CBG MONITORING, ED: Glucose-Capillary: 137 mg/dL — ABNORMAL HIGH (ref 70–99)

## 2024-01-29 LAB — I-STAT CHEM 8, ED
BUN: 28 mg/dL — ABNORMAL HIGH (ref 8–23)
Calcium, Ion: 1.21 mmol/L (ref 1.15–1.40)
Chloride: 106 mmol/L (ref 98–111)
Creatinine, Ser: 2.1 mg/dL — ABNORMAL HIGH (ref 0.44–1.00)
Glucose, Bld: 108 mg/dL — ABNORMAL HIGH (ref 70–99)
HCT: 23 % — ABNORMAL LOW (ref 36.0–46.0)
Hemoglobin: 7.8 g/dL — ABNORMAL LOW (ref 12.0–15.0)
Potassium: 4.6 mmol/L (ref 3.5–5.1)
Sodium: 136 mmol/L (ref 135–145)
TCO2: 21 mmol/L — ABNORMAL LOW (ref 22–32)

## 2024-01-29 LAB — SEDIMENTATION RATE: Sed Rate: 109 mm/h — ABNORMAL HIGH (ref 0–22)

## 2024-01-29 MED ORDER — ONDANSETRON HCL 4 MG/2ML IJ SOLN: 4.0000 mg | Freq: Four times a day (QID) | INTRAMUSCULAR | Status: DC | PRN

## 2024-01-29 MED ORDER — DEXAMETHASONE SODIUM PHOSPHATE 10 MG/ML IJ SOLN
20.0000 mg | Freq: Once | INTRAMUSCULAR | Status: AC
  Administered 2024-01-29: 20 mg via INTRAVENOUS
  Filled 2024-01-29: qty 2

## 2024-01-29 MED ORDER — INSULIN ASPART 100 UNIT/ML IJ SOLN
0.0000 [IU] | Freq: Three times a day (TID) | INTRAMUSCULAR | Status: DC
  Administered 2024-01-30: 2 [IU] via SUBCUTANEOUS
  Administered 2024-01-30: 3 [IU] via SUBCUTANEOUS
  Administered 2024-02-01 – 2024-02-02 (×2): 2 [IU] via SUBCUTANEOUS
  Filled 2024-01-29: qty 0.15

## 2024-01-29 MED ORDER — SODIUM CHLORIDE 0.9 % IV SOLN: 500.0000 mL | Freq: Once | INTRAVENOUS | Status: DC

## 2024-01-29 MED ORDER — ONDANSETRON HCL 4 MG PO TABS: 4.0000 mg | ORAL_TABLET | Freq: Four times a day (QID) | ORAL | Status: DC | PRN

## 2024-01-29 MED ORDER — PANTOPRAZOLE SODIUM 40 MG PO TBEC: DELAYED_RELEASE_TABLET | ORAL | 3 refills | Status: DC

## 2024-01-29 MED ORDER — PROSIGHT PO TABS
1.0000 | ORAL_TABLET | Freq: Every day | ORAL | Status: DC
  Administered 2024-01-30 – 2024-02-02 (×4): 1 via ORAL
  Filled 2024-01-29 (×5): qty 1

## 2024-01-29 MED ORDER — SENNOSIDES-DOCUSATE SODIUM 8.6-50 MG PO TABS
1.0000 | ORAL_TABLET | Freq: Every evening | ORAL | Status: DC | PRN
Start: 2024-01-29 — End: 2024-02-01

## 2024-01-29 MED ORDER — IRBESARTAN 300 MG PO TABS
300.0000 mg | ORAL_TABLET | Freq: Every day | ORAL | Status: DC
  Administered 2024-01-30 – 2024-02-02 (×4): 300 mg via ORAL
  Filled 2024-01-29 (×4): qty 1

## 2024-01-29 MED ORDER — PANTOPRAZOLE SODIUM 40 MG PO TBEC
40.0000 mg | DELAYED_RELEASE_TABLET | Freq: Two times a day (BID) | ORAL | Status: DC
  Administered 2024-01-30 – 2024-02-02 (×7): 40 mg via ORAL
  Filled 2024-01-29 (×7): qty 1

## 2024-01-29 MED ORDER — INSULIN ASPART 100 UNIT/ML IJ SOLN
0.0000 [IU] | Freq: Every day | INTRAMUSCULAR | Status: DC
  Filled 2024-01-29: qty 0.05

## 2024-01-29 MED ORDER — SODIUM CHLORIDE 0.9 % IV BOLUS
1000.0000 mL | Freq: Once | INTRAVENOUS | Status: AC
  Administered 2024-01-29: 1000 mL via INTRAVENOUS

## 2024-01-29 MED ORDER — ACETAMINOPHEN 650 MG RE SUPP: 650.0000 mg | Freq: Four times a day (QID) | RECTAL | Status: DC | PRN

## 2024-01-29 MED ORDER — HYDRALAZINE HCL 50 MG PO TABS
50.0000 mg | ORAL_TABLET | Freq: Two times a day (BID) | ORAL | Status: DC
  Administered 2024-01-29 – 2024-02-02 (×7): 50 mg via ORAL
  Filled 2024-01-29: qty 1
  Filled 2024-01-29 (×2): qty 2
  Filled 2024-01-29 (×5): qty 1

## 2024-01-29 MED ORDER — ACETAMINOPHEN 325 MG PO TABS: 650.0000 mg | ORAL_TABLET | Freq: Four times a day (QID) | ORAL | Status: DC | PRN

## 2024-01-29 MED ORDER — LEVOTHYROXINE SODIUM 25 MCG PO TABS
25.0000 ug | ORAL_TABLET | Freq: Every day | ORAL | Status: DC
  Administered 2024-01-30 – 2024-02-02 (×4): 25 ug via ORAL
  Filled 2024-01-29 (×4): qty 1

## 2024-01-29 MED ORDER — ROSUVASTATIN CALCIUM 20 MG PO TABS
20.0000 mg | ORAL_TABLET | Freq: Every day | ORAL | Status: DC
  Administered 2024-01-30 – 2024-02-01 (×3): 20 mg via ORAL
  Filled 2024-01-29 (×3): qty 1

## 2024-01-29 MED ORDER — SODIUM CHLORIDE 0.9% IV SOLUTION: Freq: Once | INTRAVENOUS | Status: DC

## 2024-01-29 NOTE — ED Provider Notes (Signed)
 [place] EMERGENCY DEPARTMENT AT [facility] Provider Note   CSN: [id number] Arrival date & time: 01/29/24  1604     History  Chief Complaint  Patient presents with   Abnormal Labs    Cynthia Burns is a 78 y.o. female.  Patient is a 78 year old female with a history of diabetes, hypertension, thoracic aneurysm, anemia, kidney disease who presents with abnormal blood work.  History is obtained from the patient and chart review.  It sounds like she has had some abnormal blood work for over the last year.  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  She has been followed by nephrology.  She has also had ongoing anemia.  She actually had an EGD and colonoscopy today as part of her anemia workup.  She did require a blood transfusion for hemoglobin in the sevens in December.  Apparently her nephrologist had done some blood work at the end of January that came back today indicating that she probably has multiple myeloma.  This was discussed with Dr. Cloretta with oncology who recommended that patient come to the [facility] emergency room.  Patient is relatively asymptomatic.  She did have some fatigue in December prior to her blood transfusion but other than that denies any easy bruising or bleeding.  No fevers.  No urinary symptoms.  No abdominal pain.  No chest pain or shortness of breath.       Home Medications Prior to Admission medications   Medication Sig Start Date End Date Taking? Authorizing Provider  amoxicillin  (AMOXIL ) 500 MG capsule Take 500 mg by mouth. When going to the dentist Patient not taking: Reported on 12/30/2023    [provider]  aspirin  EC 81 MG tablet Take 1 tablet (81 mg total) by mouth daily. 11/23/12   Nishan, Peter C, MD  BIOTIN PO Take by mouth.    [provider]  Blood Glucose Monitoring Suppl (ONE TOUCH ULTRA 2) w/Device KIT CHECK BLOOD SUGAR TWICE DAILY.  DX CODE  E11.9 08/23/19   Antonio Cyndee Rockers R, DO   CALCIUM  PO Take by mouth. 1-2 times a week Patient not taking: Reported on 12/30/2023    [provider]  Cyanocobalamin  (VITAMIN B-12 PO) Take 1 tablet by mouth daily.  Patient not taking: Reported on 12/30/2023    [provider]  dapagliflozin  propanediol (FARXIGA ) 10 MG TABS tablet Take 1 tablet (10 mg total) by mouth daily before breakfast. 12/30/23   Antonio Cyndee, Yvonne R, DO  ferrous sulfate 324 MG TBEC Take 324 mg by mouth. Patient not taking: Reported on 12/30/2023    [provider]  fexofenadine (ALLEGRA) 180 MG tablet Take 180 mg by mouth daily. Patient not taking: Reported on 12/30/2023    [provider]  furosemide  (LASIX ) 20 MG tablet TAKE ONE-HALF TABLET BY MOUTH EVERY DAY 03/17/23   Delford Maude BROCKS, MD  glucose blood (ONETOUCH ULTRA TEST) test strip Use as instructed 09/08/23   Antonio Cyndee, Yvonne R, DO  hydrALAZINE  (APRESOLINE ) 50 MG tablet Take 1 tablet (50 mg total) by mouth 3 (three) times daily. 12/30/23   Nishan, Peter C, MD  Lancets (ONETOUCH DELICA PLUS LANCET33G) MISC USE 1 LANCET TO TEST AS DIRECTED (DISCARD LANCET IN SHARPS CONTAINER IMMEDIATELY AFTER USE) 09/08/23   Antonio Cyndee, Rockers SAUNDERS, DO  levothyroxine  (SYNTHROID ) 25 MCG tablet TAKE ONE TABLET BY MOUTH EVERY DAY BEFORE BREAKFAST 09/08/23   Lowne Chase, Yvonne R, DO  magnesium oxide (MAG-OX) 400  MG tablet Take 400 mg by mouth daily. Patient not taking: Reported on 12/30/2023    [provider]  Multiple Vitamin (MULTIVITAMIN PO) Take by mouth. With iron Patient not taking: Reported on 12/30/2023    [provider]  Multiple Vitamins-Minerals (PRESERVISION AREDS 2) CAPS Take by mouth. Patient not taking: Reported on 12/30/2023    [provider]  nebivolol  (BYSTOLIC ) 5 MG tablet TAKE ONE TABLET BY MOUTH EVERY DAY 06/30/23   Nishan, Peter C, MD  pantoprazole  (PROTONIX ) 40 MG tablet Pantoprazole  40 mg before breakfast and dinner for 90 days with 3 refills 01/29/24   Nandigam,  Kavitha V, MD  rosuvastatin  (CRESTOR ) 20 MG tablet TAKE ONE TABLET BY MOUTH EVERY DAY 09/07/23   Nishan, Peter C, MD  Semaglutide  (RYBELSUS ) 7 MG TABS Take 1 tablet (7 mg total) by mouth daily. 09/08/23   Antonio Cyndee Jamee JONELLE, DO  valsartan  (DIOVAN ) 320 MG tablet Take 1 tablet (320 mg total) by mouth daily. 12/14/23   Santo Kelly A, MD  VITAMIN D , CHOLECALCIFEROL, PO Take by mouth daily. D3 Patient not taking: Reported on 12/30/2023    [provider]  ZINC-VITAMIN C PO Take 500 mg by mouth daily. Patient not taking: Reported on 12/30/2023    [provider]      Allergies    Mucinex [guaifenesin er], Codeine, Paxlovid  [nirmatrelvir -ritonavir ], Advicor [niacin-lovastatin er], Amlodipine, Lisinopril-hydrochlorothiazide, Other, Ramipril, and Sulfonamide derivatives    Review of Systems   Review of Systems  Constitutional:  Positive for fatigue. Negative for chills, diaphoresis and fever.  HENT:  Negative for congestion, rhinorrhea and sneezing.   Eyes: Negative.   Respiratory:  Negative for cough, chest tightness and shortness of breath.   Cardiovascular:  Negative for chest pain and leg swelling.  Gastrointestinal:  Negative for abdominal pain, blood in stool, diarrhea, nausea and vomiting.  Genitourinary:  Negative for difficulty urinating, flank pain, frequency and hematuria.  Musculoskeletal:  Negative for arthralgias and back pain.  Skin:  Negative for rash.  Neurological:  Negative for dizziness, speech difficulty, weakness, numbness and headaches.    Physical Exam Updated Vital Signs BP (!) 144/71   Pulse 77   Temp 98.1 F (36.7 C) (Oral)   Resp 13   Ht 5' 1.5 (1.562 m)   Wt 51 kg   SpO2 99%   BMI 20.90 kg/m  Physical Exam Constitutional:      Appearance: She is well-developed.  HENT:     Head: Normocephalic and atraumatic.  Eyes:     Pupils: Pupils are equal, round, and reactive to light.  Cardiovascular:     Rate and Rhythm: Normal rate  and regular rhythm.     Heart sounds: Normal heart sounds.  Pulmonary:     Effort: Pulmonary effort is normal. No respiratory distress.     Breath sounds: Normal breath sounds. No wheezing or rales.  Chest:     Chest wall: No tenderness.  Abdominal:     General: Bowel sounds are normal.     Palpations: Abdomen is soft.     Tenderness: There is no abdominal tenderness. There is no guarding or rebound.  Musculoskeletal:        General: Normal range of motion.     Cervical back: Normal range of motion and neck supple.  Lymphadenopathy:     Cervical: No cervical adenopathy.  Skin:    General: Skin is warm and dry.     Findings: No rash.  Neurological:  Mental Status: She is alert and oriented to person, place, and time.     ED Results / Procedures / Treatments   Labs (all labs ordered are listed, but only abnormal results are displayed) Labs Reviewed  CBC WITH DIFFERENTIAL/PLATELET - Abnormal; Notable for the following components:      Result Value   RBC 2.36 (*)    Hemoglobin 7.5 (*)    HCT 23.1 (*)    RDW 16.5 (*)    All other components within normal limits  I-STAT CHEM 8, ED - Abnormal; Notable for the following components:   BUN 28 (*)    Creatinine, Ser 2.10 (*)    Glucose, Bld 108 (*)    TCO2 21 (*)    Hemoglobin 7.8 (*)    HCT 23.0 (*)    All other components within normal limits  COMPREHENSIVE METABOLIC PANEL  MULTIPLE MYELOMA PANEL, SERUM  KAPPA/LAMBDA LIGHT CHAINS  SEDIMENTATION RATE  BETA 2 MICROGLOBULIN, SERUM  LACTATE DEHYDROGENASE  VITAMIN D  25 HYDROXY (VIT D DEFICIENCY, FRACTURES)  UPEP/UIFE/LIGHT CHAINS/TP, 24-HR UR  TYPE AND SCREEN    EKG None  Radiology No results found.  Procedures Procedures    Medications Ordered in ED Medications  dexamethasone  (DECADRON ) injection 20 mg (20 mg Intravenous Given 01/29/24 1912)  sodium chloride  0.9 % bolus 1,000 mL (1,000 mLs Intravenous New Bag/Given 01/29/24 1913)    ED Course/ Medical Decision  Making/ A&P                                 Medical Decision Making Amount and/or Complexity of Data Reviewed Labs: ordered.  Risk Prescription drug management. Decision regarding hospitalization.   Patient is a 78 year old female who presents with some concerning blood work.  I am unable to visualize this on chart review but it appears that she had some labs that were concerning for multiple myeloma.  Labs today show an anemia of 7.8.  This appears to be a drop from her baseline.  She also has an elevated creatinine of 2.10 which is increased from her last values in December.  Patient is relatively asymptomatic.  I discussed the patient with Dr. Onesimo with oncology.  He recommends starting the patient on Decadron  20 mg as well as IV fluids.  He will add some additional labs and will consult on the patient tomorrow.  I discussed with the hospitalist, Dr. Lou, who will admit the patient for further treatment.  She likely will need a blood transfusion.  She was typed and screened.  Blood to be ordered by hospitalist.  CRITICAL CARE Performed by: Andrea Ness Total critical care time: 60 minutes Critical care time was exclusive of separately billable procedures and treating other patients. Critical care was necessary to treat or prevent imminent or life-threatening deterioration. Critical care was time spent personally by me on the following activities: development of treatment plan with patient and/or surrogate as well as nursing, discussions with consultants, evaluation of patient's response to treatment, examination of patient, obtaining history from patient or surrogate, ordering and performing treatments and interventions, ordering and review of laboratory studies, ordering and review of radiographic studies, pulse oximetry and re-evaluation of patient's condition.   Final Clinical Impression(s) / ED Diagnoses Final diagnoses:  Multiple myeloma, remission status unspecified (HCC)   AKI (acute kidney injury) (HCC)  Anemia, unspecified type    Rx / DC Orders ED Discharge Orders     None  Lenor Hollering, MD 01/29/24 [phone]

## 2024-01-29 NOTE — Patient Instructions (Addendum)
 Colonoscopy: Handouts on diverticulosis,& hemorrhoids given to you today.    Upper Endoscopy: Await pathology results on  biopsies done  Repeat Upper Endoscopy in 3 months ( to make sure esophagitis has healed ) Continue previous diet & medications NO ASPIRIN ,IBUPROFEN,NAPROXEN OR OTHER NON STEROIDAL ANTI INFLAMMATORY DRUGS     YOU HAD AN ENDOSCOPIC PROCEDURE TODAY AT THE [facility]:   Refer to the procedure report that was given to you for any specific questions about what was found during the examination.  If the procedure report does not answer your questions, please call your gastroenterologist to clarify.  If you requested that your care partner not be given the details of your procedure findings, then the procedure report has been included in a sealed envelope for you to review at your convenience later.  YOU SHOULD EXPECT: Some feelings of bloating in the abdomen. Passage of more gas than usual.  Walking can help get rid of the air that was put into your GI tract during the procedure and reduce the bloating. If you had a lower endoscopy (such as a colonoscopy or flexible sigmoidoscopy) you may notice spotting of blood in your stool or on the toilet paper. If you underwent a bowel prep for your procedure, you may not have a normal bowel movement for a few days.  Please Note:  You might notice some irritation and congestion in your nose or some drainage.  This is from the oxygen used during your procedure.  There is no need for concern and it should clear up in a day or so.  SYMPTOMS TO REPORT IMMEDIATELY:  Following lower endoscopy (colonoscopy or flexible sigmoidoscopy):  Excessive amounts of blood in the stool  Significant tenderness or worsening of abdominal pains  Swelling of the abdomen that is new, acute  Fever of 100F or higher  Following upper endoscopy (EGD)  Vomiting of blood or coffee ground material  New chest pain or pain under the shoulder  blades  Painful or persistently difficult swallowing  New shortness of breath  Fever of 100F or higher  Black, tarry-looking stools  For urgent or emergent issues, a gastroenterologist can be reached at any hour by calling [phone]. Do not use MyChart messaging for urgent concerns.    DIET:  We do recommend a small meal at first, but then you may proceed to your regular diet.  Drink plenty of fluids but you should avoid alcoholic beverages for 24 hours.  ACTIVITY:  You should plan to take it easy for the rest of today and you should NOT DRIVE or use heavy machinery until tomorrow (because of the sedation medicines used during the test).    FOLLOW UP: Our staff will call the number listed on your records the next business day following your procedure.  We will call around 7:15- 8:00 am to check on you and address any questions or concerns that you may have regarding the information given to you following your procedure. If we do not reach you, we will leave a message.     If any biopsies were taken you will be contacted by phone or by letter within the next 1-3 weeks.  Please call us  at [phone] if you have not heard about the biopsies in 3 weeks.    SIGNATURES/CONFIDENTIALITY: You and/or your care partner have signed paperwork which will be entered into your electronic medical record.  These signatures attest to the fact that that the information above on your After Visit  Summary has been reviewed and is understood.  Full responsibility of the confidentiality of this discharge information lies with you and/or your care-partner.  GERD in Adults: What to Know  Gastroesophageal reflux (GER) is when acid from your stomach flows up into your esophagus. Your esophagus is the part of your body that moves food from your mouth to your stomach. Normally, food goes down and stays in your stomach to be digested. But with GER, food and stomach acid may go back up. You may have a disease  called gastroesophageal reflux disease (GERD) if the reflux: Happens often. Causes very bad symptoms. Makes your esophagus sore and swollen. Over time, GERD can make small holes called ulcers in the lining of your esophagus. What are the causes? GERD is caused by a problem with the muscle between your esophagus and stomach. This muscle is called the lower esophageal sphincter (LES). When it's weak or not normal, it doesn't close like it should. This means food and stomach acid can go back up into your esophagus. The muscle can be weak if: You smoke or use products with tobacco in them. You're pregnant. You have a type of hernia called a hiatal hernia. You eat certain foods and drinks. These include: Alcohol. Coffee. Chocolate. Onions. Peppermint. What increases the risk? Being overweight. Having a disease that affects your connective tissue. Taking NSAIDs, such as ibuprofen. What are the signs or symptoms? Heartburn. Trouble swallowing. Pain when you swallow. The feeling of having a lump in your throat. A bitter taste in your mouth. Bad breath. Having an upset or bloated stomach. Burping. Chest pain. Other conditions can also cause chest pain. Make sure you see your health care provider if you have chest pain. Wheezing. This is when you make high-pitched whistling sounds when you breathe, most often when you breathe out. A long-term cough or a cough at night. How is this diagnosed? GERD may be diagnosed based on your medical history and a physical exam. You may also have tests. These may include: An endoscopy. This test looks at your stomach and esophagus with a small camera. A barium swallow test. This shows the shape and size of your esophagus and how well it's working. Tests of your esophagus to check for: Acid levels. Pressure. How is this treated? Treatment may depend on how bad your symptoms are. It may include: Changes to your diet and daily  life. Medicines. Surgery. Follow these instructions at home: Eating and drinking Follow an eating plan as told by your provider. You may need to avoid certain foods and drinks. These may include: Coffee and tea, with or without caffeine. Alcohol. Energy drinks and sports drinks. Fizzy drinks or sodas. Chocolate and cocoa. Peppermint and mint flavorings. Garlic and onions. Horseradish. Spicy and acidic foods. These include: Peppers. Chili powder and curry powder. Vinegar. Hot sauces and BBQ sauce. Citrus fruits and juices. These include: Oranges. Lemons. Limes. Tomato-based foods. These include: Red sauce and pizza with red sauce. Chili. Salsa. Fried and fatty foods. These include: Donuts. French fries. Potato chips. High-fat dressings. High-fat meats. These include: Hot dogs and sausage. Rib eye steak. Ham and bacon. High-fat dairy items. These include: Whole milk. Butter. Cream cheese. Eat small meals often. Avoid eating big meals. Avoid drinking lots of liquid with your meals. Try not to eat meals during the 2-3 hours before bedtime. Try not to lie down right after you eat. Do not exercise right after you eat. Lifestyle  If you're overweight, lose an amount  of weight that's healthy for you. Ask your provider about a safe weight loss goal. Do not smoke, vape, or use nicotine or tobacco. Wear loose clothes. Do not wear things that are tight around your waist. When you sleep, try: Raising the head of your bed about 6 inches (15 cm). You can use a wedge to do this. Lying down on your left side. Try to lower your stress. If you need help doing this, ask your provider. General instructions Take your medicines only as told. Do not take aspirin  or ibuprofen unless you're told to. Watch for any changes in your symptoms. Do not bend over if it makes your symptoms worse. Contact a health care provider if: You have new symptoms. You have  trouble: Drinking. Swallowing. Eating. It hurts to swallow. You have wheezing. You have a cough that won't go away. Your voice is hoarse. Your symptoms don't get better with treatment. Get help right away if: You have pain all of a sudden in your: Arm. Neck. Jaw. Teeth. Back. You feel sweaty, dizzy, or light-headed all of a sudden. You faint. You have chest pain or shortness of breath. You vomit and the vomit is: Green, yellow, or black. Looks like blood or coffee grounds. Your poop is red, bloody, or black. These symptoms may be an emergency. Call 911 right away. Do not wait to see if the symptoms will go away. Do not drive yourself to the hospital. This information is not intended to replace advice given to you by your health care provider. Make sure you discuss any questions you have with your health care provider. Document Revised: 10/20/2023 Document Reviewed: 05/06/2023 Elsevier Patient Education  2024 [employer].

## 2024-01-29 NOTE — Progress Notes (Signed)
 Oncology Short Note  Patient recommended to come to ER by Dr Cloretta for likely newly diagnosed myeloma with rapidly worsening hgb and renal insuff with proteinuria.  [facility]available in referral under media) show M spike of 6.1g/dl and Kappa light chains in the 400's.  She was recommended to come in for expedited workup and expedited initiation of treatment.  Recommendation -admit to hospitalist service. -myeloma labs ordered (done) -whole body skeletal survey -CT guided bone marrow aspiration and biopsy with myeloma FISH panel -dexamethasone  20mg  daily x 4 doses (will need SSI or other adjustment to DM medication to address resultant hyperglycemia). -strict IO -IVF to maintain urine output of atleast 2L per day. -will treat with DaraCyBord once diagnosis confirmed -tranfuse prn for hgb<7.5 -full official consult to follow tomorrow.   Brinlyn Cena MD MS

## 2024-01-29 NOTE — Progress Notes (Signed)
 Called to room to assist during endoscopic procedure.  Patient ID and intended procedure confirmed with present staff. Received instructions for my participation in the procedure from the performing physician.

## 2024-01-29 NOTE — Progress Notes (Signed)
 PATIENT NAVIGATOR PROGRESS NOTE  Name: Cynthia Burns Date: 01/29/2024 MRN: [id number]  DOB: 10-29-1946   Reason for visit:  New patient referral  Comments:  Received [facility] referral for [name].  Reviewed with Dr Cloretta and he recommends that patient go to emergency room for expedited workup.  Called patient's cell and home phone and left message for return call.  Dr Dennise aware of recommendation    Time spent counseling/coordinating care: 30-45 minutes

## 2024-01-29 NOTE — Progress Notes (Signed)
 Sedate, gd SR, tolerated procedure well, VSS, report to RN

## 2024-01-29 NOTE — Progress Notes (Signed)
 Vitals-NS  Pt's states no medical or surgical changes since previsit or office visit.  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

## 2024-01-29 NOTE — Op Note (Signed)
 [facility] Patient Name: Cynthia Burns Procedure Date: 01/29/2024 8:53 AM MRN: [id number] Endoscopist: Gustav ALONSO Mcgee , MD, [id number] Age: 78 Referring MD:  Date of Birth: Jun 02, 1946 Gender: Female Account #: [id number] Procedure:                Colonoscopy Indications:              Unexplained iron deficiency anemia Medicines:                Monitored Anesthesia Care Procedure:                Pre-Anesthesia Assessment:                           - Prior to the procedure, a History and Physical                            was performed, and patient medications and                            allergies were reviewed. The patient's tolerance of                            previous anesthesia was also reviewed. The risks                            and benefits of the procedure and the sedation                            options and risks were discussed with the patient.                            All questions were answered, and informed consent                            was obtained. Prior Anticoagulants: The patient has                            taken no anticoagulant or antiplatelet agents. ASA                            Grade Assessment: II - A patient with mild systemic                            disease. After reviewing the risks and benefits,                            the patient was deemed in satisfactory condition to                            undergo the procedure.                           After obtaining informed consent, the colonoscope  was passed under direct vision. Throughout the                            procedure, the patient's blood pressure, pulse, and                            oxygen saturations were monitored continuously. The                            Olympus Scope [id number] was introduced through the                            anus and advanced to the the cecum, identified by                            appendiceal orifice  and ileocecal valve. The                            colonoscopy was performed without difficulty. The                            patient tolerated the procedure well. The quality                            of the bowel preparation was good. The ileocecal                            valve, appendiceal orifice, and rectum were                            photographed. Scope In: 9:08:10 AM Scope Out: 9:19:39 AM Scope Withdrawal Time: 0 hours 8 minutes 25 seconds  Total Procedure Duration: 0 hours 11 minutes 29 seconds  Findings:                 The perianal and digital rectal examinations were                            normal.                           Scattered small-mouthed diverticula were found in                            the sigmoid colon, descending colon, transverse                            colon and ascending colon.                           Non-bleeding external and internal hemorrhoids were                            found during retroflexion. The hemorrhoids were  small. Complications:            No immediate complications. Estimated Blood Loss:     Estimated blood loss was minimal. Impression:               - Diverticulosis in the sigmoid colon, in the                            descending colon, in the transverse colon and in                            the ascending colon.                           - Non-bleeding external and internal hemorrhoids.                           - No specimens collected. Recommendation:           - Patient has a contact number available for                            emergencies. The signs and symptoms of potential                            delayed complications were discussed with the                            patient. Return to normal activities tomorrow.                            Written discharge instructions were provided to the                            patient.                           - Resume previous  diet.                           - Continue present medications.                           - No repeat colonoscopy due to age. Purcell Jungbluth V. Jshawn Hurta, MD 01/29/2024 9:32:01 AM This report has been signed electronically.

## 2024-01-29 NOTE — Op Note (Signed)
 [facility] Patient Name: Cynthia Burns Procedure Date: 01/29/2024 8:54 AM MRN: [id number] Endoscopist: Gustav ALONSO Mcgee , MD, [id number] Age: 78 Referring MD:  Date of Birth: 1946/10/02 Gender: Female Account #: [id number] Procedure:                Upper GI endoscopy Indications:              Gastrointestinal bleeding of unknown origin,                            Suspected upper gastrointestinal bleeding in                            patient with unexplained iron deficiency anemia Medicines:                Monitored Anesthesia Care Procedure:                Pre-Anesthesia Assessment:                           - Prior to the procedure, a History and Physical                            was performed, and patient medications and                            allergies were reviewed. The patient's tolerance of                            previous anesthesia was also reviewed. The risks                            and benefits of the procedure and the sedation                            options and risks were discussed with the patient.                            All questions were answered, and informed consent                            was obtained. Prior Anticoagulants: The patient has                            taken no anticoagulant or antiplatelet agents. ASA                            Grade Assessment: II - A patient with mild systemic                            disease. After reviewing the risks and benefits,                            the patient was deemed in satisfactory condition to  undergo the procedure.                           After obtaining informed consent, the endoscope was                            passed under direct vision. Throughout the                            procedure, the patient's blood pressure, pulse, and                            oxygen saturations were monitored continuously. The                            Olympus  Scope [id number] was introduced through the                            mouth, and advanced to the second part of duodenum.                            The upper GI endoscopy was accomplished without                            difficulty. The patient tolerated the procedure                            well. Scope In: Scope Out: Findings:                 LA Grade C (one or more mucosal breaks continuous                            between tops of 2 or more mucosal folds, less than                            75% circumference) esophagitis was found 34 to 36                            cm from the incisors.                           A 2 cm hiatal hernia was present.                           Patchy mild inflammation characterized by                            congestion (edema), erosions, erythema and                            friability was found at the incisura, in the                            gastric antrum and in the prepyloric region  of the                            stomach. Biopsies were taken with a cold forceps                            for Helicobacter pylori testing.                           The cardia and gastric fundus were normal on                            retroflexion.                           The examined duodenum was normal. Complications:            No immediate complications. Estimated Blood Loss:     Estimated blood loss was minimal. Impression:               - LA Grade C reflux esophagitis.                           - 2 cm hiatal hernia.                           - Gastritis. Biopsied.                           - Normal examined duodenum. Recommendation:           - Patient has a contact number available for                            emergencies. The signs and symptoms of potential                            delayed complications were discussed with the                            patient. Return to normal activities tomorrow.                            Written  discharge instructions were provided to the                            patient.                           - Resume previous diet.                           - Continue present medications.                           - Await pathology results.                           - No ibuprofen,  naproxen, or other non-steroidal                            anti-inflammatory drugs.                           - Follow an antireflux regimen.                           - Repeat EGD in 2-3 months for surveillance,                            document healing of mucosa                           - Pantoprazole  40mg  before breakfast and dinner X                            90 days with 3 refills Roshonda Sperl V. Tyran Huser, MD 01/29/2024 9:47:47 AM This report has been signed electronically.

## 2024-01-29 NOTE — ED Triage Notes (Addendum)
 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 Sent here for further testing and biopsies. Denies pain.

## 2024-01-29 NOTE — Progress Notes (Signed)
 [facility] History and Physical   Primary Care Physician:  Antonio Meth, Jamee SAUNDERS, DO   Reason for Procedure:  Anemia, unexplained  Plan:    EGD and colonoscopy with possible interventions as needed     HPI: Cynthia Burns is a very pleasant 78 y.o. female here for EGD and colonoscopy for evaluation of unexplained anemia.   The risks and benefits as well as alternatives of endoscopic procedure(s) have been discussed and reviewed. All questions answered. The patient agrees to proceed.    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   HSV-1 infection    HYPERTENSION    Hypothyroid    MITRAL VALVE PROLAPSE    OSTEOPENIA    Overweight(278.02)    PHARYNGITIS, ACUTE     Past Surgical History:  Procedure Laterality Date   ABDOMINAL HYSTERECTOMY     CHOLECYSTECTOMY     prolped bladder     WISDOM TOOTH EXTRACTION      Prior to Admission medications   Medication Sig Start Date End Date Taking? Authorizing Provider  Blood Glucose Monitoring Suppl (ONE TOUCH ULTRA 2) w/Device KIT CHECK BLOOD SUGAR TWICE DAILY.  DX CODE  E11.9 08/23/19  Yes Antonio Meth Jamee R, DO  furosemide  (LASIX ) 20 MG tablet TAKE ONE-HALF TABLET BY MOUTH EVERY DAY 03/17/23  Yes Delford Maude BROCKS, MD  glucose blood (ONETOUCH ULTRA TEST) test strip Use as instructed 09/08/23  Yes Lowne Meth Jamee SAUNDERS, DO  hydrALAZINE  (APRESOLINE ) 50 MG tablet Take 1 tablet (50 mg total) by mouth 3 (three) times daily. 12/30/23  Yes Delford Maude BROCKS, MD  Lancets (ONETOUCH DELICA PLUS LANCET33G) MISC USE 1 LANCET TO TEST AS DIRECTED (DISCARD LANCET IN SHARPS CONTAINER IMMEDIATELY AFTER USE) 09/08/23  Yes Antonio Meth, Jamee R, DO  levothyroxine  (SYNTHROID ) 25 MCG  tablet TAKE ONE TABLET BY MOUTH EVERY DAY BEFORE BREAKFAST 09/08/23  Yes Antonio Meth Jamee R, DO  rosuvastatin  (CRESTOR ) 20 MG tablet TAKE ONE TABLET BY MOUTH EVERY DAY 09/07/23  Yes Delford Maude BROCKS, MD  Semaglutide  (RYBELSUS ) 7 MG TABS Take 1 tablet (7 mg total) by mouth daily. 09/08/23  Yes Antonio Meth Jamee SAUNDERS, DO  valsartan  (DIOVAN ) 320 MG tablet Take 1 tablet (320 mg total) by mouth daily. 12/14/23  Yes Chandrasekhar, Mahesh A, MD  amoxicillin  (AMOXIL ) 500 MG capsule Take 500 mg by mouth. When going to the dentist Patient not taking: Reported on 12/30/2023    [provider]  aspirin  EC 81 MG tablet Take 1 tablet (81 mg total) by mouth daily. 11/23/12   Nishan, Peter C, MD  BIOTIN PO Take by mouth.    [provider]  CALCIUM  PO Take by mouth. 1-2 times a week Patient not taking: Reported on 12/30/2023    [provider]  Cyanocobalamin  (VITAMIN B-12 PO) Take 1 tablet by mouth daily.  Patient not taking: Reported on 12/30/2023    [provider]  dapagliflozin  propanediol (FARXIGA ) 10 MG TABS tablet Take 1 tablet (10 mg total) by mouth daily before breakfast. 12/30/23   Antonio Meth, Yvonne R, DO  ferrous sulfate 324 MG TBEC Take 324 mg by mouth. Patient  not taking: Reported on 12/30/2023    [provider]  fexofenadine (ALLEGRA) 180 MG tablet Take 180 mg by mouth daily. Patient not taking: Reported on 12/30/2023    [provider]  magnesium oxide (MAG-OX) 400 MG tablet Take 400 mg by mouth daily. Patient not taking: Reported on 12/30/2023    [provider]  Multiple Vitamin (MULTIVITAMIN PO) Take by mouth. With iron Patient not taking: Reported on 12/30/2023    [provider]  Multiple Vitamins-Minerals (PRESERVISION AREDS 2) CAPS Take by mouth. Patient not taking: Reported on 12/30/2023    [provider]  nebivolol  (BYSTOLIC ) 5 MG tablet TAKE ONE TABLET BY MOUTH EVERY DAY 06/30/23   Nishan, Peter C, MD  VITAMIN D ,  CHOLECALCIFEROL, PO Take by mouth daily. D3 Patient not taking: Reported on 12/30/2023    [provider]  ZINC-VITAMIN C PO Take 500 mg by mouth daily. Patient not taking: Reported on 12/30/2023    [provider]    Current Outpatient Medications  Medication Sig Dispense Refill   Blood Glucose Monitoring Suppl (ONE TOUCH ULTRA 2) w/Device KIT CHECK BLOOD SUGAR TWICE DAILY.  DX CODE  E11.9 1 kit 0   furosemide  (LASIX ) 20 MG tablet TAKE ONE-HALF TABLET BY MOUTH EVERY DAY 45 tablet 3   glucose blood (ONETOUCH ULTRA TEST) test strip Use as instructed 200 each 3   hydrALAZINE  (APRESOLINE ) 50 MG tablet Take 1 tablet (50 mg total) by mouth 3 (three) times daily. 270 tablet 3   Lancets (ONETOUCH DELICA PLUS LANCET33G) MISC USE 1 LANCET TO TEST AS DIRECTED (DISCARD LANCET IN SHARPS CONTAINER IMMEDIATELY AFTER USE) 200 each 1   levothyroxine  (SYNTHROID ) 25 MCG tablet TAKE ONE TABLET BY MOUTH EVERY DAY BEFORE BREAKFAST 90 tablet 1   rosuvastatin  (CRESTOR ) 20 MG tablet TAKE ONE TABLET BY MOUTH EVERY DAY 90 tablet 1   Semaglutide  (RYBELSUS ) 7 MG TABS Take 1 tablet (7 mg total) by mouth daily. 90 tablet 1   valsartan  (DIOVAN ) 320 MG tablet Take 1 tablet (320 mg total) by mouth daily. 90 tablet 3   amoxicillin  (AMOXIL ) 500 MG capsule Take 500 mg by mouth. When going to the dentist (Patient not taking: Reported on 12/30/2023)     aspirin  EC 81 MG tablet Take 1 tablet (81 mg total) by mouth daily. 90 tablet 3   BIOTIN PO Take by mouth.     CALCIUM  PO Take by mouth. 1-2 times a week (Patient not taking: Reported on 12/30/2023)     Cyanocobalamin  (VITAMIN B-12 PO) Take 1 tablet by mouth daily.  (Patient not taking: Reported on 12/30/2023)     dapagliflozin  propanediol (FARXIGA ) 10 MG TABS tablet Take 1 tablet (10 mg total) by mouth daily before breakfast. 30 tablet 2   ferrous sulfate 324 MG TBEC Take 324 mg by mouth. (Patient not taking: Reported on 12/30/2023)     fexofenadine (ALLEGRA) 180 MG tablet  Take 180 mg by mouth daily. (Patient not taking: Reported on 12/30/2023)     magnesium oxide (MAG-OX) 400 MG tablet Take 400 mg by mouth daily. (Patient not taking: Reported on 12/30/2023)     Multiple Vitamin (MULTIVITAMIN PO) Take by mouth. With iron (Patient not taking: Reported on 12/30/2023)     Multiple Vitamins-Minerals (PRESERVISION AREDS 2) CAPS Take by mouth. (Patient not taking: Reported on 12/30/2023)     nebivolol  (BYSTOLIC ) 5 MG tablet TAKE ONE TABLET BY MOUTH EVERY DAY 90 tablet 2   VITAMIN D , CHOLECALCIFEROL, PO  Take by mouth daily. D3 (Patient not taking: Reported on 12/30/2023)     ZINC-VITAMIN C PO Take 500 mg by mouth daily. (Patient not taking: Reported on 12/30/2023)     Current Facility-Administered Medications  Medication Dose Route Frequency Provider Last Rate Last Admin   0.9 %  sodium chloride  infusion  500 mL Intravenous Once Zan Orlick V, MD        Allergies as of 01/29/2024 - Review Complete 01/29/2024  Allergen Reaction Noted   Mucinex [guaifenesin er] Anaphylaxis 05/09/2014   Codeine Nausea And Vomiting    Paxlovid  [nirmatrelvir -ritonavir ] Diarrhea and Nausea And Vomiting 08/22/2022   Advicor [niacin-lovastatin er] Rash 11/08/2012   Amlodipine Rash 11/08/2012   Lisinopril-hydrochlorothiazide Rash 11/08/2012   Other Rash 10/26/2013   Ramipril Rash 06/03/2007   Sulfonamide derivatives Rash     Family History  Problem Relation Age of Onset   Hypertension Mother    Heart disease Mother        Had a stent   Emphysema Father    Diabetes Father    COPD Father    Stroke Sister    Breast cancer Neg Hx     Social History   Socioeconomic History   Marital status: Married    Spouse name: Not on file   Number of children: Not on file   Years of education: Not on file   Highest education level: Associate degree: occupational, scientist, product/process development, or vocational program  Occupational History   Occupation: hairdresser  Tobacco Use   Smoking status: Never   Smokeless  tobacco: Never  Vaping Use   Vaping status: Never Used  Substance and Sexual Activity   Alcohol use: No    Alcohol/week: 0.0 standard drinks of alcohol   Drug use: No   Sexual activity: Not Currently    Partners: Male    Birth control/protection: Surgical, Abstinence    Comment: hysterectomy, 17, less than 5  Other Topics Concern   Not on file  Social History Narrative   Not on file   Social Drivers of Health   Financial Resource Strain: Low Risk  (12/07/2023)   Overall Financial Resource Strain (CARDIA)    Difficulty of Paying Living Expenses: Not hard at all  Food Insecurity: No Food Insecurity (12/07/2023)   Hunger Vital Sign    Worried About Radiation Protection Practitioner of Food in the Last Year: Never true    Ran Out of Food in the Last Year: Never true  Transportation Needs: No Transportation Needs (12/07/2023)   PRAPARE - Administrator, Civil Service (Medical): No    Lack of Transportation (Non-Medical): No  Physical Activity: Insufficiently Active (12/07/2023)   Exercise Vital Sign    Days of Exercise per Week: 1 day    Minutes of Exercise per Session: 10 min  Stress: No Stress Concern Present (12/07/2023)   [employer] of Occupational Health - Occupational Stress Questionnaire    Feeling of Stress : Only a little  Social Connections: Unknown (12/07/2023)   Social Connection and Isolation Panel [NHANES]    Frequency of Communication with Friends and Family: More than three times a week    Frequency of Social Gatherings with Friends and Family: Once a week    Attends Religious Services: Patient declined    Database Administrator or Organizations: Yes    Attends Banker Meetings: Patient declined    Marital Status: Married  Catering Manager Violence: Not At Risk (07/31/2023)   Humiliation, Afraid, Rape, and Kick  questionnaire    Fear of Current or Ex-Partner: No    Emotionally Abused: No    Physically Abused: No    Sexually Abused: No    Review of  Systems:  All other review of systems negative except as mentioned in the HPI.  Physical Exam: Vital signs in last 24 hours: BP 133/80 (BP Location: Right Arm, Patient Position: Sitting, Cuff Size: Normal)   Pulse 76   Temp (!) 97.3 F (36.3 C) (Temporal)   Ht 5' 1.5 (1.562 m)   Wt 111 lb (50.3 kg)   SpO2 99%   BMI 20.63 kg/m  General:   Alert, NAD Lungs:  Clear .   Heart:  Regular rate and rhythm Abdomen:  Soft, nontender and nondistended. Neuro/Psych:  Alert and cooperative. Normal mood and affect. A and O x 3  Reviewed labs, radiology imaging, old records and pertinent past GI work up  Patient is appropriate for planned procedure(s) and anesthesia in an ambulatory setting   K. Veena Roverto Bodmer , MD [phone]

## 2024-01-29 NOTE — Progress Notes (Signed)
 Spoke with Mr Moncayo and discussed Dr Megan recommendation for Ms Wendel to go to Emergency Room for workup for possible Multiple Myeloma.    Dr Cloretta is discussing her case with Hem/Onc colleague Dr Onesimo who is on call today.   Mr Trott stated that they would go to [facility] ER within the hour.     Dr Dennise informed

## 2024-01-29 NOTE — H&P (Addendum)
 History and Physical  Cynthia Burns FMW:[id number] DOB: 06-01-1946 DOA: 01/29/2024  PCP: Antonio Cyndee Jamee JONELLE, DO   Chief Complaint: Abnormal labs  HPI: Cynthia Burns is a 78 y.o. female with medical history significant for T2DM, HTN, anemia, hypothyroidism, CKD stage IV HLD and GERD who was sent to the ED by oncology for evaluation for multiple myeloma. Per family, patient started having some about a year ago.  She was found to have significant anemia and advancement of her CKD so she was referred to nephrologist, Dr. Dennise who ordered multiple labs for further evaluation 2 months ago. Yesterday, they received a call from Dr. Christina patient's blood work showed that she likely has multiple myeloma. The case was discussed with Dr. Cloretta who recommends patient presents to the ED for emergent workup and treatment. Patient had EGD and colonoscopy today with no complications. Family reports that patient continues to be fatigued and has had about a 30 pound weight loss in the last year. She has also had a poor appetite over the last few months. Patient denies any dizziness, headache, palpitations, bloody stools or hematuria or back pain.  ED Course: Vitals stable on arrival. Labs are significant for stable hyponatremia 129, K+ 4.5, bicarb 18, glucose 106, BUN/creatinine 28/1.80, total protein >12.0, albumin 2.9, WBC 8.4, Hgb 7.5, platelet 155. On-call oncologist, Dr. Onesimo was consulted for evaluation. [facility] was consulted for admission  Review of Systems: Please see HPI for pertinent positives and negatives. A complete 10 system review of systems are otherwise negative.  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   HSV-1 infection     HYPERTENSION    Hypothyroid    MITRAL VALVE PROLAPSE    OSTEOPENIA    Overweight(278.02)    PHARYNGITIS, ACUTE    Past Surgical History:  Procedure Laterality Date   ABDOMINAL HYSTERECTOMY     CHOLECYSTECTOMY     prolped bladder     WISDOM TOOTH EXTRACTION     Social History:  reports that she has never smoked. She has never used smokeless tobacco. She reports that she does not drink alcohol and does not use drugs.  Allergies  Allergen Reactions   Mucinex [Guaifenesin Er] Anaphylaxis   Calcium -Containing Compounds Other (See Comments)    Broke out the mouth inside- tiny bumps   Codeine Nausea And Vomiting   Paxlovid  [Nirmatrelvir -Ritonavir ] Diarrhea and Nausea And Vomiting   Advicor [Niacin-Lovastatin Er] Rash   Amlodipine Rash   Lisinopril-Hydrochlorothiazide Rash   Other Rash    Eye drops with preservatives.     Ramipril Rash   Sulfonamide Derivatives Rash    Family History  Problem Relation Age of Onset   Hypertension Mother    Heart disease Mother        Had a stent   Emphysema Father    Diabetes Father    COPD Father    Stroke Sister    Breast cancer Neg Hx      Prior to Admission medications   Medication Sig Start Date End Date Taking? Authorizing Provider  amoxicillin  (AMOXIL ) 500 MG capsule Take 2,000 mg by mouth See admin instructions. Take 2,000 mg  by mouth one hour prior to dental visits   Yes [provider]  Biotin 5000 MCG CAPS Take 5,000 mcg by mouth daily.   Yes [provider]  hydrALAZINE  (APRESOLINE ) 50 MG tablet Take 1 tablet (50 mg total) by mouth 3 (three) times daily. Patient taking differently: Take 50 mg by mouth See admin instructions. Take 50 mg by mouth with lunch and supper 12/30/23  Yes Nishan, Peter C, MD  Multiple Vitamins-Minerals (PRESERVISION AREDS 2) CAPS Take 1 capsule by mouth daily with lunch.   Yes [provider]  valsartan  (DIOVAN ) 320 MG tablet Take 1 tablet (320 mg total) by mouth daily. Patient  taking differently: Take 320 mg by mouth daily with lunch. 12/14/23  Yes Chandrasekhar, Mahesh A, MD  aspirin  EC 81 MG tablet Take 1 tablet (81 mg total) by mouth daily. Patient not taking: Reported on 01/29/2024 11/23/12   Delford Maude BROCKS, MD  Blood Glucose Monitoring Suppl (ONE TOUCH ULTRA 2) w/Device KIT CHECK BLOOD SUGAR TWICE DAILY.  DX CODE  E11.9 08/23/19   Lowne Chase, Yvonne R, DO  dapagliflozin  propanediol (FARXIGA ) 10 MG TABS tablet Take 1 tablet (10 mg total) by mouth daily before breakfast. Patient not taking: Reported on 01/29/2024 12/30/23   Antonio Meth, Yvonne R, DO  fexofenadine (ALLEGRA) 180 MG tablet Take 180 mg by mouth daily.    [provider]  furosemide  (LASIX ) 20 MG tablet TAKE ONE-HALF TABLET BY MOUTH EVERY DAY 03/17/23   Nishan, Peter C, MD  glucose blood (ONETOUCH ULTRA TEST) test strip Use as instructed 09/08/23   Antonio Meth, Yvonne R, DO  Lancets (ONETOUCH DELICA PLUS LANCET33G) MISC USE 1 LANCET TO TEST AS DIRECTED (DISCARD LANCET IN SHARPS CONTAINER IMMEDIATELY AFTER USE) 09/08/23   Antonio Meth Jamee JONELLE, DO  levothyroxine  (SYNTHROID ) 25 MCG tablet TAKE ONE TABLET BY MOUTH EVERY DAY BEFORE BREAKFAST Patient taking differently: Take 25 mcg by mouth daily before breakfast. 09/08/23  Yes Antonio Meth Jamee R, DO  nebivolol  (BYSTOLIC ) 5 MG tablet TAKE ONE TABLET BY MOUTH EVERY DAY Patient not taking: Reported on 01/29/2024 06/30/23   Nishan, Peter C, MD  pantoprazole  (PROTONIX ) 40 MG tablet Pantoprazole  40 mg before breakfast and dinner for 90 days with 3 refills 01/29/24   Nandigam, Kavitha V, MD  rosuvastatin  (CRESTOR ) 20 MG tablet TAKE ONE TABLET BY MOUTH EVERY DAY Patient taking differently: Take 20 mg by mouth daily with supper. 09/07/23  Yes Delford Maude BROCKS, MD  Semaglutide  (RYBELSUS ) 7 MG TABS Take 1 tablet (7 mg total) by mouth daily. Patient not taking: Reported on 01/29/2024 09/08/23   Antonio Meth Jamee JONELLE, DO    Physical Exam: BP (!) 144/71   Pulse 77   Temp 97.9  F (36.6 C) (Oral)   Resp 13   Ht 5' 1.5 (1.562 m)   Wt 51 kg   SpO2 99%   BMI 20.90 kg/m  General: Pleasant, tired appearing elderly woman laying in bed. No acute distress. HEENT: [place]/AT. Anicteric sclera. Dry mucous membrane. Slightly pale conjunctiva. CV: RRR. Soft murmur at RUSB. No LE edema Pulmonary: Lungs CTAB. Normal effort. No wheezing or rales. Abdominal: Soft, nontender, nondistended. Normal bowel sounds. Extremities: Palpable radial and DP pulses. Normal ROM. Skin: Warm and dry. No obvious rash or lesions. Neuro: A&Ox3. Moves all extremities. Normal sensation to light touch. No focal deficit. Psych: Normal mood and affect          Labs on Admission:  Basic Metabolic Panel: Recent  Labs  Lab 01/29/24 1830 01/29/24 1841  NA 129* 136  K 4.5 4.6  CL 104 106  CO2 18*  --   GLUCOSE 106* 108*  BUN 28* 28*  CREATININE 1.80* 2.10*  CALCIUM  9.1  --    Liver Function Tests: Recent Labs  Lab 01/29/24 1830  AST 30  ALT 40  ALKPHOS 26*  BILITOT 0.6  PROT >12.0*  ALBUMIN 2.9*   No results for input(s): LIPASE, AMYLASE in the last 168 hours. No results for input(s): AMMONIA in the last 168 hours. CBC: Recent Labs  Lab 01/29/24 1830 01/29/24 1841  WBC 8.4  --   NEUTROABS 6.0  --   HGB 7.5* 7.8*  HCT 23.1* 23.0*  MCV 97.9  --   PLT 155  --    Cardiac Enzymes: No results for input(s): CKTOTAL, CKMB, CKMBINDEX, TROPONINI in the last 168 hours. BNP (last 3 results) No results for input(s): BNP in the last 8760 hours.  ProBNP (last 3 results) No results for input(s): PROBNP in the last 8760 hours.  CBG: No results for input(s): GLUCAP in the last 168 hours.  Radiological Exams on Admission: No results found. Assessment/Plan Kloi Brodman is a 78 y.o. female with medical history significant for T2DM, HTN, anemia, hypothyroidism, CKD stage IV HLD and GERD who was sent to the ED by oncology for evaluation for multiple myeloma.   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 Labs from 1/22 at her nephrologist's office showed negative ANA, c-ANCA and p-ANCA, elevated M spike at 6.1 (43.7%), total globin 8.5, normal free kappa light chains at 15.4, elevated free lambda light chain of 436, kappa/lambda ratio low at 0.04, and significantly elevated protein/creatinine ratio of 954. Labs on admission shows elevated protein gap, normal calcium  and significant drop in hemoglobin from 11.0 2 months ago to 7.5.  She endorsed fatigue but no bone pain. Patient admitted for further workup and management.  -Heme-onc following, full official consult in the morning, recs below -Multiple myeloma labs pending -Whole-body skeletal survey -CT-guided bone marrow aspiration and biopsy who myeloma FISH panel -Dexamethasone  20 mg daily x 4 doses -Transfuse 1 unit PRBC -Follow-up posttransfusion H&H  # CKD stage IV # Chronic Hyponatremia Patient has been followed by [facility] over the last year due to progression of her CKD. 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 Creatinine of 1.80 from 1.67 2 months ago. Sodium stable at 129. Status post 1 L IV NS in the ED. -Multiple myeloma workup as above -Encourage oral hydration -Avoid nephrotoxic's agents -Trend renal function  # T2DM Last A1c 6.2% 2 months ago. Blood glucose of 106 on CMP. -SSI with meals and at bedtime -CBG monitoring  # Esophagitis # Gastritis Patient had an EGD and colonoscopy prior to arrival to the ED. EGD showed LA grade C reflux esophagitis, 2 cm hiatal hernia and gastritis that was biopsied.  Colonoscopy showed diverticulosis and nonbleeding external  and internal hemorrhoids. -Continue on Protonix  40 mg twice daily -Surgical pathology pending  # HTN -Continue hydralazine  and Irbesartin (substitute for valsartan )  # Hypothyroidism -Continue on Synthroid  -Follow-up TSH  # HLD -Continue rosuvastatin    DVT prophylaxis: Lovenox     Code Status:  Full Code  Consults called: Hematology/oncology  Family Communication: Discussed admission with daughter and spouse at bedside  Severity of Illness: The appropriate patient status for this patient is INPATIENT. Inpatient status is judged to be reasonable and necessary in order to provide the required intensity of service to ensure the patient's safety. The patient's presenting symptoms, physical exam findings, and initial radiographic and laboratory data in the context of their chronic comorbidities is felt to place them at high risk for further clinical deterioration. Furthermore, it is not anticipated that the patient will be medically stable for discharge from the hospital within 2 midnights of admission.   * I certify that at the point of admission it is my clinical judgment that the patient will require inpatient hospital care spanning beyond 2 midnights from the point of admission due to high intensity of service, high risk for further deterioration and high frequency of surveillance required.*  Level of care: Med-Surg   Lou Claretta HERO, MD 01/29/2024, 9:03 PM [facility] Pager: [phone] Isaiah 41:10   If 7PM-7AM, please contact night-coverage [email or web address] Password [email or web address]

## 2024-01-30 DIAGNOSIS — D649 Anemia, unspecified: Secondary | ICD-10-CM | POA: Diagnosis not present

## 2024-01-30 DIAGNOSIS — N289 Disorder of kidney and ureter, unspecified: Secondary | ICD-10-CM

## 2024-01-30 DIAGNOSIS — R809 Proteinuria, unspecified: Secondary | ICD-10-CM

## 2024-01-30 DIAGNOSIS — C9000 Multiple myeloma not having achieved remission: Secondary | ICD-10-CM | POA: Diagnosis not present

## 2024-01-30 LAB — GLUCOSE, CAPILLARY
Glucose-Capillary: 93 mg/dL (ref 70–99)
Glucose-Capillary: 131 mg/dL — ABNORMAL HIGH (ref 70–99)

## 2024-01-30 LAB — COMPREHENSIVE METABOLIC PANEL
ALT: 30 U/L (ref 0–44)
AST: 22 U/L (ref 15–41)
Albumin: 2.5 g/dL — ABNORMAL LOW (ref 3.5–5.0)
Alkaline Phosphatase: 22 U/L — ABNORMAL LOW (ref 38–126)
Anion gap: 3 — ABNORMAL LOW (ref 5–15)
BUN: 28 mg/dL — ABNORMAL HIGH (ref 8–23)
CO2: 17 mmol/L — ABNORMAL LOW (ref 22–32)
Calcium: 8.6 mg/dL — ABNORMAL LOW (ref 8.9–10.3)
Chloride: 108 mmol/L (ref 98–111)
Creatinine, Ser: 1.86 mg/dL — ABNORMAL HIGH (ref 0.44–1.00)
GFR, Estimated: 28 mL/min — ABNORMAL LOW (ref >60)
Glucose, Bld: 142 mg/dL — ABNORMAL HIGH (ref 70–99)
Potassium: 4.5 mmol/L (ref 3.5–5.1)
Sodium: 128 mmol/L — ABNORMAL LOW (ref 135–145)
Total Bilirubin: 0.6 mg/dL (ref 0.0–1.2)
Total Protein: 11 g/dL — ABNORMAL HIGH (ref 6.5–8.1)

## 2024-01-30 LAB — TSH: TSH: 0.725 u[IU]/mL (ref 0.350–4.500)

## 2024-01-30 LAB — CBC
HCT: 24.1 % — ABNORMAL LOW (ref 36.0–46.0)
Hemoglobin: 8 g/dL — ABNORMAL LOW (ref 12.0–15.0)
MCH: 31.6 pg (ref 26.0–34.0)
MCHC: 33.2 g/dL (ref 30.0–36.0)
MCV: 95.3 fL (ref 80.0–100.0)
Platelets: 139 10*3/uL — ABNORMAL LOW (ref 150–400)
RBC: 2.53 MIL/uL — ABNORMAL LOW (ref 3.87–5.11)
RDW: 17.6 % — ABNORMAL HIGH (ref 11.5–15.5)
WBC: 10.9 10*3/uL — ABNORMAL HIGH (ref 4.0–10.5)
nRBC: 0 % (ref 0.0–0.2)

## 2024-01-30 LAB — CBG MONITORING, ED
Glucose-Capillary: 124 mg/dL — ABNORMAL HIGH (ref 70–99)
Glucose-Capillary: 155 mg/dL — ABNORMAL HIGH (ref 70–99)

## 2024-01-30 LAB — BETA 2 MICROGLOBULIN, SERUM: Beta-2 Microglobulin: 8.6 mg/L — ABNORMAL HIGH (ref 0.6–2.4)

## 2024-01-30 LAB — VITAMIN D 25 HYDROXY (VIT D DEFICIENCY, FRACTURES): Vit D, 25-Hydroxy: 75.84 ng/mL (ref 30–100)

## 2024-01-30 MED ORDER — SODIUM CHLORIDE 0.9 % IV SOLN
INTRAVENOUS | Status: AC
  Administered 2024-01-31: 125 mL/h via INTRAVENOUS

## 2024-01-30 MED ORDER — DEXAMETHASONE SODIUM PHOSPHATE 10 MG/ML IJ SOLN
20.0000 mg | INTRAMUSCULAR | Status: DC
  Administered 2024-01-30 – 2024-01-31 (×2): 20 mg via INTRAVENOUS
  Filled 2024-01-30 (×3): qty 2

## 2024-01-30 NOTE — Progress Notes (Signed)
 PROGRESS NOTE    Cynthia Burns  FMW:[id number]  DOB: 1946-03-23  DOA: 01/29/2024 PCP: Antonio Cyndee Jamee JONELLE, DO Outpatient Specialists:   Hospital course:  78 year old with DM2, HTN, CKD 3B who was admitted per oncology request for management of new diagnosis of multiple myeloma.  She was treated with IV fluids, 1 unit PRBC, dexamethasone  and scheduled for CT-guided biopsies.  Subjective:  Met with patient and her family today, patient has no acute physical complaints.  Most of the discussion was about the diagnosis of multiple myeloma, workup and prognosis.    Objective: Vitals:   01/30/24 0403 01/30/24 0600 01/30/24 0803 01/30/24 1004  BP: 135/66 (!) 142/66  (!) 123/92  Pulse: 89 84  98  Resp: 12 (!) 23  16  Temp: 97.8 F (36.6 C)  97.6 F (36.4 C) 97.6 F (36.4 C)  TempSrc: Oral  Oral Oral  SpO2: 95% 97%  98%  Weight:      Height:        Intake/Output Summary (Last 24 hours) at 01/30/2024 1059 Last data filed at 01/30/2024 9158 Gross per 24 hour  Intake 1237 ml  Output 200 ml  Net 1037 ml   Filed Weights   01/29/24 1622  Weight: 51 kg     Exam:  General: Well-appearing patient sitting up in bed with tearful daughter and attentive husband at bedside Eyes: sclera anicteric, conjuctiva mild injection bilaterally CVS: S1-S2, regular  Respiratory:  decreased air entry bilaterally secondary to decreased inspiratory effort, rales at bases  GI: NABS, soft, NT  LE: Warm and well-perfused Neuro: A/O x 3,  grossly nonfocal.  Psych: patient is logical and coherent, judgement and insight appear normal, mood and affect appropriate to situation.  Data Reviewed:  Basic Metabolic Panel: Recent Labs  Lab 01/29/24 1830 01/29/24 1841 01/29/24 2030 01/30/24 0555  NA 129* 136  --  128*  K 4.5 4.6  --  4.5  CL 104 106  --  108  CO2 18*  --   --  17*  GLUCOSE 106* 108*  --  142*  BUN 28* 28*  --  28*  CREATININE 1.80* 2.10*  --  1.86*  CALCIUM  9.1  --   --  8.6*   MG  --   --  2.0  --   PHOS  --   --  5.1*  --     CBC: Recent Labs  Lab 01/29/24 1830 01/29/24 1841 01/30/24 0555  WBC 8.4  --  10.9*  NEUTROABS 6.0  --   --   HGB 7.5* 7.8* 8.0*  HCT 23.1* 23.0* 24.1*  MCV 97.9  --  95.3  PLT 155  --  139*     Scheduled Meds:  sodium chloride    Intravenous Once   dexamethasone  (DECADRON ) injection  20 mg Intravenous Q24H   hydrALAZINE   50 mg Oral BID AC   insulin  aspart  0-15 Units Subcutaneous TID WC   insulin  aspart  0-5 Units Subcutaneous QHS   irbesartan   300 mg Oral Daily   levothyroxine   25 mcg Oral Q0600   multivitamin  1 tablet Oral Q lunch   pantoprazole   40 mg Oral BID AC   rosuvastatin   20 mg Oral Q supper   Continuous Infusions:  sodium chloride      sodium chloride  125 mL/hr at 01/30/24 0846     Assessment & Plan:   MM, new diagnosis Patient admitted for treatment and workup per medical oncology Presently getting IV  fluids to keep urine output greater than 2 L a day Is s/p 1 unit PRBC, hemoglobin increased from 8.5-9. Is on dexamethasone  20 mg every 24 Skeletal survey, CT-guided bone marrow aspiration and myeloma labs are ordered and pending Being followed by Dr. Onesimo, oncology  DM2 Will need to follow sugars closely on dexamethasone  Present they are under good control on SSI  CKD 3B Chronic hyponatremia Sodium is at her baseline, patient is asymptomatic from this standpoint Followed by [facility], avoid nephrotoxins  GERD/esophagitis Continue Protonix   HTN Well-controlled on home medications  Hypothyroidism Continue Synthroid      DVT prophylaxis: SCD Code Status: Full Family Communication: Daughter and son were at bedside throughout     Studies: No results found.  Principal Problem:   Symptomatic anemia Active Problems:   Multiple myeloma (HCC)   CKD (chronic kidney disease) stage 4, GFR 15-29 ml/min (HCC)     Kamee Bobst Vangie Pike, [facility]  If 7PM-7AM,  please contact night-coverage [email or web address]   LOS: 1 day

## 2024-01-30 NOTE — Consult Note (Signed)
 HEMATOLOGY/ONCOLOGY CONSULTATION NOTE  Date of Service: 01/30/2024  Patient Care Team: Antonio Meth, Jamee SAUNDERS, DO as PCP - General Delford Maude BROCKS, MD as PCP - Cardiology (Cardiology) Rosan Credit, MD as Consulting Physician (Ophthalmology) Obie Princella HERO, MD (Inactive) (Gastroenterology) Vernetta Lonni GRADE, MD as Consulting Physician (Orthopedic Surgery) Ivin Kocher, MD as Referring Physician (Dermatology) Georgia Blonder, MD as Consulting Physician (Obstetrics and Gynecology)  CHIEF COMPLAINTS/PURPOSE OF CONSULTATION:  Most likely multiple myeloma  HISTORY OF PRESENTING ILLNESS:   Cynthia Burns is a wonderful 78 y.o. female who was scheduled to see Dr. Andriette as a new patient and was referred to the ED for evaluation and management of newly diagnosed myeloma with rapidly worsening hgb and renal insuffiencey with proteinuria. High suspicious of Multiple myeloma.   Outside labs done at Dr Ephriam Singh's office (available in referral under media) show M spike of 6.1g/dl and Kappa light chains in the 400's.  Patient is accompanied by her husband and her daughter at bed side during the visit. Patient notes she has been having significantly more fatigue. Has required PRBC transfusions for symptomatic anemia.  She complains of fatigue, right ankle pain, and unexpected weight loss of around 25-30 lbs due to appetite loss in the past year. Her daughter notes that the patient has been confused more often as well.   She denies any new infection issues, fever, chills, back pain, chest pain, abdominal pain, or leg swelling.   Patient notes she tested positive for COVID-19 infection in August 2022 and was prescribed Paxlovid . However, she did not tolerate the medication due to allergic reaction.  PmHx of hypertension. She has been taking Losartan  and hydralazine .  Surgery history: Hysterectomy and Cholecystectomy.   She was started on dexamethasone  20 mg daily x 4  doses yesterday. She has been tolerating the high-dose steroid well.    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   HSV-1 infection    HYPERTENSION    Hypothyroid    MITRAL VALVE PROLAPSE    OSTEOPENIA    Overweight(278.02)    PHARYNGITIS, ACUTE     SURGICAL HISTORY: Past Surgical History:  Procedure Laterality Date   ABDOMINAL HYSTERECTOMY     CHOLECYSTECTOMY     prolped bladder     WISDOM TOOTH EXTRACTION      SOCIAL HISTORY: Social History   Socioeconomic History   Marital status: Married    Spouse name: Not on file   Number of children: Not on file   Years of education: Not on file   Highest education level: Associate degree: occupational, scientist, product/process development, or vocational program  Occupational History   Occupation: hairdresser  Tobacco Use   Smoking status: Never   Smokeless tobacco: Never  Vaping Use   Vaping status: Never Used  Substance and Sexual Activity   Alcohol use: No    Alcohol/week: 0.0 standard drinks of alcohol   Drug use: No   Sexual activity: Not Currently    Partners: Male    Birth control/protection: Surgical, Abstinence    Comment: hysterectomy, 17, less than 5  Other Topics Concern   Not on file  Social History Narrative   Not on file   Social Drivers of Health   Financial Resource Strain: Low Risk  (12/07/2023)   Overall Financial Resource Strain (CARDIA)    Difficulty of Paying Living Expenses: Not hard at all  Food Insecurity: No Food Insecurity (12/07/2023)   Hunger Vital Sign    Worried About Running Out of Food in the Last Year: Never true    Ran Out of Food in the Last Year: Never true  Transportation Needs: No Transportation Needs (12/07/2023)    PRAPARE - Administrator, Civil Service (Medical): No    Lack of Transportation (Non-Medical): No  Physical Activity: Insufficiently Active (12/07/2023)   Exercise Vital Sign    Days of Exercise per Week: 1 day    Minutes of Exercise per Session: 10 min  Stress: No Stress Concern Present (12/07/2023)   [employer] of Occupational Health - Occupational Stress Questionnaire    Feeling of Stress : Only a little  Social Connections: Unknown (12/07/2023)   Social Connection and Isolation Panel [NHANES]    Frequency of Communication with Friends and Family: More than three times a week    Frequency of Social Gatherings with Friends and Family: Once a week    Attends Religious Services: Patient declined    Database Administrator or Organizations: Yes    Attends Banker Meetings: Patient declined    Marital Status: Married  Catering Manager Violence: Not At Risk (07/31/2023)   Humiliation, Afraid, Rape, and Kick questionnaire    Fear of Current or Ex-Partner: No    Emotionally Abused: No    Physically Abused: No    Sexually Abused: No    FAMILY HISTORY: Family History  Problem Relation Age of Onset   Hypertension Mother    Heart disease Mother        Had a stent   Emphysema Father    Diabetes Father    COPD Father    Stroke Sister    Breast cancer Neg Hx     ALLERGIES:  is allergic to mucinex [guaifenesin er], bystolic  [nebivolol  hcl], calcium -containing compounds, codeine, paxlovid  [nirmatrelvir -ritonavir ], advicor [niacin-lovastatin er], amlodipine, lisinopril-hydrochlorothiazide, other, ramipril, and sulfonamide derivatives.  MEDICATIONS:  Current Facility-Administered Medications  Medication Dose Route Frequency Provider Last Rate Last Admin   0.9 %  sodium chloride  infusion (Manually program via Guardrails IV Fluids)   Intravenous Once Amponsah, Prosper M, MD       0.9 %  sodium chloride  infusion  500 mL Intravenous Once Nandigam, Kavitha V, MD        0.9 %  sodium chloride  infusion   Intravenous Continuous Chatterjee, Srobona Tublu, MD 125 mL/hr at 01/30/24 0846 New Bag at 01/30/24 0846   acetaminophen  (TYLENOL ) tablet 650 mg  650 mg Oral Q6H PRN Amponsah, Prosper M, MD       Or   acetaminophen  (TYLENOL ) suppository 650 mg  650 mg Rectal Q6H PRN Lou Claretta HERO, MD       dexamethasone  (DECADRON ) injection 20 mg  20 mg Intravenous Q24H Chatterjee, Srobona Tublu, MD       hydrALAZINE  (APRESOLINE ) tablet 50 mg  50 mg Oral BID AC Lou Claretta HERO, MD   50 mg at 01/29/24 2144   insulin  aspart (novoLOG ) injection 0-15 Units  0-15 Units Subcutaneous TID WC Amponsah, Prosper M, MD   2 Units at 01/30/24 0840   insulin  aspart (novoLOG ) injection 0-5 Units  0-5 Units Subcutaneous QHS Lou Claretta HERO, MD  irbesartan  (AVAPRO ) tablet 300 mg  300 mg Oral Daily Lou Claretta HERO, MD       levothyroxine  (SYNTHROID ) tablet 25 mcg  25 mcg Oral Q0600 Amponsah, Prosper M, MD   25 mcg at 01/30/24 0551   multivitamin (PROSIGHT) tablet 1 tablet  1 tablet Oral Q lunch Lou Claretta HERO, MD       ondansetron  (ZOFRAN ) tablet 4 mg  4 mg Oral Q6H PRN Amponsah, Prosper M, MD       Or   ondansetron  (ZOFRAN ) injection 4 mg  4 mg Intravenous Q6H PRN Amponsah, Prosper M, MD       pantoprazole  (PROTONIX ) EC tablet 40 mg  40 mg Oral BID AC Lou Claretta HERO, MD       rosuvastatin  (CRESTOR ) tablet 20 mg  20 mg Oral Q supper Amponsah, Prosper M, MD       senna-docusate (Senokot-S) tablet 1 tablet  1 tablet Oral QHS PRN Lou Claretta HERO, MD       Current Outpatient Medications  Medication Sig Dispense Refill   amoxicillin  (AMOXIL ) 500 MG capsule Take 2,000 mg by mouth See admin instructions. Take 2,000 mg by mouth one hour prior to dental visits     Biotin 5000 MCG CAPS Take 5,000 mcg by mouth daily.     hydrALAZINE  (APRESOLINE ) 50 MG tablet Take 1 tablet (50 mg total) by mouth 3 (three) times daily. (Patient taking differently: Take 50 mg by mouth  See admin instructions. Take 50 mg by mouth with lunch and supper) 270 tablet 3   levothyroxine  (SYNTHROID ) 25 MCG tablet TAKE ONE TABLET BY MOUTH EVERY DAY BEFORE BREAKFAST (Patient taking differently: Take 25 mcg by mouth daily before breakfast.) 90 tablet 1   Multiple Vitamins-Minerals (PRESERVISION AREDS 2) CAPS Take 1 capsule by mouth daily with lunch.     pantoprazole  (PROTONIX ) 40 MG tablet Pantoprazole  40 mg before breakfast and dinner for 90 days with 3 refills (Patient taking differently: Take 40 mg by mouth See admin instructions. Take 40 mg by mouth 30 minutes before lunch and supper) 180 tablet 3   rosuvastatin  (CRESTOR ) 20 MG tablet TAKE ONE TABLET BY MOUTH EVERY DAY (Patient taking differently: Take 20 mg by mouth daily with supper.) 90 tablet 1   valsartan  (DIOVAN ) 320 MG tablet Take 1 tablet (320 mg total) by mouth daily. (Patient taking differently: Take 320 mg by mouth daily with lunch.) 90 tablet 3   aspirin  EC 81 MG tablet Take 1 tablet (81 mg total) by mouth daily. (Patient not taking: Reported on 01/29/2024) 90 tablet 3   Blood Glucose Monitoring Suppl (ONE TOUCH ULTRA 2) w/Device KIT CHECK BLOOD SUGAR TWICE DAILY.  DX CODE  E11.9 1 kit 0   furosemide  (LASIX ) 20 MG tablet TAKE ONE-HALF TABLET BY MOUTH EVERY DAY (Patient not taking: Reported on 01/29/2024) 45 tablet 3   glucose blood (ONETOUCH ULTRA TEST) test strip Use as instructed 200 each 3   Lancets (ONETOUCH DELICA PLUS LANCET33G) MISC USE 1 LANCET TO TEST AS DIRECTED (DISCARD LANCET IN SHARPS CONTAINER IMMEDIATELY AFTER USE) 200 each 1   nebivolol  (BYSTOLIC ) 5 MG tablet TAKE ONE TABLET BY MOUTH EVERY DAY (Patient not taking: Reported on 01/29/2024) 90 tablet 2   Semaglutide  (RYBELSUS ) 7 MG TABS Take 1 tablet (7 mg total) by mouth daily. (Patient not taking: Reported on 01/29/2024) 90 tablet 1    REVIEW OF SYSTEMS:    10 Point review of Systems was done is negative except as noted  above.  PHYSICAL EXAMINATION: ECOG PERFORMANCE  STATUS: 1 - Symptomatic but completely ambulatory  . Vitals:   01/30/24 0803 01/30/24 1004  BP:  (!) 123/92  Pulse:  98  Resp:  16  Temp: 97.6 F (36.4 C) 97.6 F (36.4 C)  SpO2:  98%   Filed Weights   01/29/24 1622  Weight: 51 kg   .Body mass index is 20.9 kg/m.  GENERAL:alert, in no acute distress and comfortable SKIN: no acute rashes, no significant lesions EYES: conjunctiva are pink and non-injected, sclera anicteric OROPHARYNX: MMM, no exudates, no oropharyngeal erythema or ulceration NECK: supple, no JVD LYMPH:  no palpable lymphadenopathy in the cervical, axillary or inguinal regions LUNGS: clear to auscultation b/l with normal respiratory effort HEART: regular rate & rhythm ABDOMEN:  normoactive bowel sounds , non tender, not distended. Extremity: no pedal edema PSYCH: alert & oriented x 3 with fluent speech NEURO: no focal motor/sensory deficits  LABORATORY DATA:  I have reviewed the data as listed  .    Latest Ref Rng & Units 01/30/2024    5:55 AM 01/29/2024    6:41 PM 01/29/2024    6:30 PM  CBC  WBC 4.0 - 10.5 K/uL 10.9   8.4   Hemoglobin 12.0 - 15.0 g/dL 8.0  7.8  7.5   Hematocrit 36.0 - 46.0 % 24.1  23.0  23.1   Platelets 150 - 400 K/uL 139   155     .    Latest Ref Rng & Units 01/30/2024    5:55 AM 01/29/2024    6:41 PM 01/29/2024    6:30 PM  CMP  Glucose 70 - 99 mg/dL 857  891  893   BUN 8 - 23 mg/dL 28  28  28    Creatinine 0.44 - 1.00 mg/dL 8.13  7.89  8.19   Sodium 135 - 145 mmol/L 128  136  129   Potassium 3.5 - 5.1 mmol/L 4.5  4.6  4.5   Chloride 98 - 111 mmol/L 108  106  104   CO2 22 - 32 mmol/L 17   18   Calcium  8.9 - 10.3 mg/dL 8.6   9.1   Total Protein 6.5 - 8.1 g/dL 88.9   >87.9   Total Bilirubin 0.0 - 1.2 mg/dL 0.6   0.6   Alkaline Phos 38 - 126 U/L 22   26   AST 15 - 41 U/L 22   30   ALT 0 - 44 U/L 30   40      RADIOGRAPHIC STUDIES: I have personally reviewed the radiological images as listed and agreed with the findings in the  report. VAS US  RENAL ARTERY DUPLEX Result Date: 01/11/2024 ABDOMINAL VISCERAL Patient Name:  AIDALY CORDNER  Date of Exam:   01/08/2024 Medical Rec #: [id number]        Accession #:    [id number] Date of Birth: 12/12/1946       Patient Gender: F Patient Age:   69 years Exam Location:  [facility] Procedure:      VAS US  RENAL ARTERY DUPLEX Referring Phys: [id number] [facility] A CHANDRASEKHAR -------------------------------------------------------------------------------- Indications: Hypertensive urgency. Patient denies any abdominal pain. High Risk Factors: Hypertension, hyperlipidemia, Diabetes, no history of                    smoking. Limitations: Air/bowel gas. Comparison Study: NA Performing Technologist: Nanetta Shad RVT  Examination Guidelines: A complete evaluation includes B-mode imaging, spectral Doppler, color Doppler, and power Doppler  as needed of all accessible portions of each vessel. Bilateral testing is considered an integral part of a complete examination. Limited examinations for reoccurring indications may be performed as noted.  Duplex Findings: +--------------------+--------+--------+------+---------------+ Mesenteric          PSV cm/sEDV cm/sPlaque   Comments     +--------------------+--------+--------+------+---------------+ Aorta Prox            118                 1.8 cm x 1.8 cm +--------------------+--------+--------+------+---------------+ Aorta Distal          119                                 +--------------------+--------+--------+------+---------------+ Celiac Artery Origin  145      25                         +--------------------+--------+--------+------+---------------+ SMA Origin             85      7                          +--------------------+--------+--------+------+---------------+  +------------------+--------+--------+-------+ Right Renal ArteryPSV cm/sEDV cm/sComment +------------------+--------+--------+-------+ Origin              112       13           +------------------+--------+--------+-------+ Proximal            137      19           +------------------+--------+--------+-------+ Mid                 124      30           +------------------+--------+--------+-------+ Distal               90      19           +------------------+--------+--------+-------+ +-----------------+--------+--------+-------+ Left Renal ArteryPSV cm/sEDV cm/sComment +-----------------+--------+--------+-------+ Origin             107      28           +-----------------+--------+--------+-------+ Proximal           124      19           +-----------------+--------+--------+-------+ Mid                 82      13           +-----------------+--------+--------+-------+ Distal              65      15           +-----------------+--------+--------+-------+ +------------+--------+--------+----+-----------+--------+--------+----+ Right KidneyPSV cm/sEDV cm/sRI  Left KidneyPSV cm/sEDV cm/sRI   +------------+--------+--------+----+-----------+--------+--------+----+ Upper Pole  27      6       0.78Upper Pole 27      6       0.76 +------------+--------+--------+----+-----------+--------+--------+----+ Mid         27      6       0.        31      7       0.78 +------------+--------+--------+----+-----------+--------+--------+----+ Lower Pole  22      5       0.76Lower  Pole 26      5       0.80 +------------+--------+--------+----+-----------+--------+--------+----+ Hilar       50      9       0.82Hilar      34      7       0.79 +------------+--------+--------+----+-----------+--------+--------+----+ +------------------+------+------------------+------+ Right Kidney            Left Kidney              +------------------+------+------------------+------+ RAR                     RAR                      +------------------+------+------------------+------+ RAR (manual)       1.16  RAR (manual)      1.05   +------------------+------+------------------+------+ Cortex            .74 cmCortex            .72 cm +------------------+------+------------------+------+ Cortex thickness        Corex thickness          +------------------+------+------------------+------+ Kidney length (cm)9.32  Kidney length (cm)9.42   +------------------+------+------------------+------+  Summary: Largest Aortic Diameter: 1.8 cm  Renal:  Right: Normal size right kidney. Abnormal right Resistive Index.        Normal cortical thickness of right kidney. No evidence of        right renal artery stenosis. RRV flow present. Extrarenal        pelvis. Left:  Normal size of left kidney. Abnormal left Resistive Index.        Normal cortical thickness of the left kidney. No evidence of        left renal artery stenosis. LRV flow present. Extrarenal        pelvis. Mesenteric: Normal Celiac artery and Superior Mesenteric artery findings.  Patent IVC.  *See table(s) above for measurements and observations.  Diagnosing physician: Deatrice Cage MD  Electronically signed by Deatrice Cage MD on 01/11/2024 at 7:46:39 AM.    Final     ASSESSMENT & PLAN:   78 yo with   1) Likely Multiple Myeloma with monoclonal paraproteinemia of 6.1g/dl and significantly elevated light chains.  2) Symptomatic Anemia -- likely from myeloma + CKD  3) Acute on Chronic renal  insufficiency. Could have some baseline CKD from HTN, DM but significant proteinuria and worsening renal function could certain be from myeloma Follows with Dr Ephriam Stank  4) HTN - variable control  5) Dm2  6) HLD  7) hypothyrroidism  PLAN  -Recent labs from today: Hemoglobin has improved from 7.8 g/dL to 8.0 g/dL with hematocrit of 75.8% and low platelets of 139 K.  -Discussed with the patient and her family that we have a high suspicious of this being multiple myeloma, but not confirmed diagnosis.  -Educated the patient about  multiple myeloma.   -waiting on myeloma labs that were ordered yesterday.  -Discussed with the patient the next plan is  -continue IVF to keep her kidneys flushed to avoid worsening renal function. -Dexamethasone  20mg  daily x 4 days to try to reduced M-protein load -CT guided Bone marrow aspiration and biopsy-- hopefully on Monday -Patient will get Bone marrow biopsy Monday with myeloma FISH panel, and bone density survey.  -will treat with DaraCyBord once diagnosis confirmed -tranfuse prn for hgb<7.5 -Answered all of patient's questions. -Recommend to follow-up with Dr. Stank regarding kidney biopsy.   -  appreciate help from hospitalist.  All of the patients questions were answered with apparent satisfaction. The patient knows to call the clinic with any problems, questions or concerns.  .The total time spent in the appointment was 80 minutes* .  All of the patient's questions were answered with apparent satisfaction. The patient knows to call the clinic with any problems, questions or concerns.   Emaline Saran MD MS AAHIVMS [facility] Hematology/Oncology Physician [facility]  .*Total Encounter Time as defined by the Centers for Medicare and Medicaid Services includes, in addition to the face-to-face time of a patient visit (documented in the note above) non-face-to-face time: obtaining and reviewing outside history, ordering and reviewing medications, tests or procedures, care coordination (communications with other health care professionals or caregivers) and documentation in the medical record.   01/30/2024 10:36 AM  I,Param Shah,acting as a scribe for Dr. Akiyah Eppolito.   .I have reviewed the above documentation for accuracy and completeness, and I agree with the above. . Nylan Nakatani MD MS

## 2024-01-30 NOTE — ED Notes (Signed)
 Approx 50% breakfast eaten

## 2024-01-30 NOTE — Plan of Care (Signed)

## 2024-01-31 ENCOUNTER — Encounter (HOSPITAL_COMMUNITY): Payer: Self-pay | Admitting: Student

## 2024-01-31 DIAGNOSIS — D649 Anemia, unspecified: Secondary | ICD-10-CM | POA: Diagnosis not present

## 2024-01-31 LAB — CBC WITH DIFFERENTIAL/PLATELET
Abs Immature Granulocytes: 0.18 10*3/uL — ABNORMAL HIGH (ref 0.00–0.07)
Basophils Absolute: 0 10*3/uL (ref 0.0–0.1)
Basophils Relative: 0 %
Eosinophils Absolute: 0 10*3/uL (ref 0.0–0.5)
Eosinophils Relative: 0 %
HCT: 24.5 % — ABNORMAL LOW (ref 36.0–46.0)
Hemoglobin: 8 g/dL — ABNORMAL LOW (ref 12.0–15.0)
Immature Granulocytes: 1 %
Lymphocytes Relative: 14 %
Lymphs Abs: 2.1 10*3/uL (ref 0.7–4.0)
MCH: 31.4 pg (ref 26.0–34.0)
MCHC: 32.7 g/dL (ref 30.0–36.0)
MCV: 96.1 fL (ref 80.0–100.0)
Monocytes Absolute: 0.9 10*3/uL (ref 0.1–1.0)
Monocytes Relative: 6 %
Neutro Abs: 11.9 10*3/uL — ABNORMAL HIGH (ref 1.7–7.7)
Neutrophils Relative %: 79 %
Platelets: 140 10*3/uL — ABNORMAL LOW (ref 150–400)
RBC: 2.55 MIL/uL — ABNORMAL LOW (ref 3.87–5.11)
RDW: 17.2 % — ABNORMAL HIGH (ref 11.5–15.5)
WBC: 15 10*3/uL — ABNORMAL HIGH (ref 4.0–10.5)
nRBC: 0 % (ref 0.0–0.2)

## 2024-01-31 LAB — PROTEIN, URINE, 24 HOUR
Collection Interval-UPROT: 24 h
Protein, 24H Urine: 360 mg/d — ABNORMAL HIGH (ref 50–100)
Protein, Urine: 10 mg/dL
Urine Total Volume-UPROT: 3600 mL

## 2024-01-31 LAB — COMPREHENSIVE METABOLIC PANEL
ALT: 24 U/L (ref 0–44)
AST: 19 U/L (ref 15–41)
Albumin: 2.5 g/dL — ABNORMAL LOW (ref 3.5–5.0)
Alkaline Phosphatase: 23 U/L — ABNORMAL LOW (ref 38–126)
Anion gap: 4 — ABNORMAL LOW (ref 5–15)
BUN: 23 mg/dL (ref 8–23)
CO2: 16 mmol/L — ABNORMAL LOW (ref 22–32)
Calcium: 8.1 mg/dL — ABNORMAL LOW (ref 8.9–10.3)
Chloride: 109 mmol/L (ref 98–111)
Creatinine, Ser: 1.5 mg/dL — ABNORMAL HIGH (ref 0.44–1.00)
GFR, Estimated: 36 mL/min — ABNORMAL LOW (ref >60)
Glucose, Bld: 132 mg/dL — ABNORMAL HIGH (ref 70–99)
Potassium: 4.1 mmol/L (ref 3.5–5.1)
Sodium: 129 mmol/L — ABNORMAL LOW (ref 135–145)
Total Bilirubin: 0.3 mg/dL (ref 0.0–1.2)
Total Protein: 10.5 g/dL — ABNORMAL HIGH (ref 6.5–8.1)

## 2024-01-31 LAB — GLUCOSE, CAPILLARY
Glucose-Capillary: 118 mg/dL — ABNORMAL HIGH (ref 70–99)
Glucose-Capillary: 105 mg/dL — ABNORMAL HIGH (ref 70–99)
Glucose-Capillary: 97 mg/dL (ref 70–99)
Glucose-Capillary: 161 mg/dL — ABNORMAL HIGH (ref 70–99)

## 2024-01-31 MED ORDER — CLONAZEPAM 0.5 MG PO TABS
0.5000 mg | ORAL_TABLET | Freq: Every day | ORAL | Status: AC | PRN
  Administered 2024-01-31 – 2024-02-01 (×3): 0.5 mg via ORAL
  Filled 2024-01-31 (×3): qty 1

## 2024-01-31 NOTE — Progress Notes (Signed)
 Mobility Specialist - Progress Note   01/31/24 1420  Mobility  Activity Ambulated independently in hallway  Level of Assistance Independent  Assistive Device None  Distance Ambulated (ft) 480 ft  Activity Response Tolerated well  Mobility Referral Yes  Mobility visit 1 Mobility  Mobility Specialist Start Time (ACUTE ONLY) 1409  Mobility Specialist Stop Time (ACUTE ONLY) 1420  Mobility Specialist Time Calculation (min) (ACUTE ONLY) 11 min   Pt received in bed and agreeable to mobility. No complaints during session. Pt to bed after session with all needs met.   [facility]

## 2024-01-31 NOTE — Consult Note (Signed)
 Chief Complaint: Patient was seen in consultation today for abnormal labs  Referring Physician(s): Dr. Emaline Saran  Supervising Physician: Karalee Beat  Patient Status: [facility] - In-pt  History of Present Illness: Cynthia Burns is a 78 y.o. female with history of anemia, CKD, HTN, DM who presents to ED from PCP due to rapidly declining Hgb and renal insufficient with proteinuria. Due to elevate M spike there is concern for multiple myeloma.  IR consulted for bone marrow biopsy at the request of Dr. Saran.   Patient assessed at bedside.  She is aware of request for biopsy and is agreeable to proceed.   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   HSV-1 infection    HYPERTENSION    Hypothyroid    MITRAL VALVE PROLAPSE    OSTEOPENIA    Overweight(278.02)    PHARYNGITIS, ACUTE     Past Surgical History:  Procedure Laterality Date   ABDOMINAL HYSTERECTOMY     CHOLECYSTECTOMY     prolped bladder     WISDOM TOOTH EXTRACTION      Allergies: Mucinex [guaifenesin er], Bystolic  [nebivolol  hcl], Calcium -containing compounds, Codeine, Paxlovid  [nirmatrelvir -ritonavir ], Advicor [niacin-lovastatin er], Amlodipine, Lisinopril-hydrochlorothiazide, Other, Ramipril, and Sulfonamide derivatives  Medications: Prior to Admission medications   Medication Sig Start Date End Date Taking? Authorizing Provider  amoxicillin  (AMOXIL ) 500 MG capsule Take 2,000 mg by mouth See admin instructions. Take 2,000 mg by mouth one hour prior to dental visits   Yes [provider]  Biotin 5000 MCG CAPS Take 5,000 mcg by mouth daily.   Yes [provider]  hydrALAZINE  (APRESOLINE ) 50 MG tablet Take 1 tablet (50 mg total) by  mouth 3 (three) times daily. Patient taking differently: Take 50 mg by mouth See admin instructions. Take 50 mg by mouth with lunch and supper 12/30/23  Yes Delford Maude BROCKS, MD  levothyroxine  (SYNTHROID ) 25 MCG tablet TAKE ONE TABLET BY MOUTH EVERY DAY BEFORE BREAKFAST Patient taking differently: Take 25 mcg by mouth daily before breakfast. 09/08/23  Yes Lowne Chase, Yvonne R, DO  Multiple Vitamins-Minerals (PRESERVISION AREDS 2) CAPS Take 1 capsule by mouth daily with lunch.   Yes [provider]  pantoprazole  (PROTONIX ) 40 MG tablet Pantoprazole  40 mg before breakfast and dinner for 90 days with 3 refills Patient taking differently: Take 40 mg by mouth See admin instructions. Take 40 mg by mouth 30 minutes before lunch and supper 01/29/24  Yes Nandigam, Kavitha V, MD  rosuvastatin  (CRESTOR ) 20 MG tablet TAKE ONE TABLET BY MOUTH EVERY DAY Patient taking differently: Take 20 mg by mouth daily with supper. 09/07/23  Yes Delford Maude BROCKS, MD  valsartan  (DIOVAN ) 320 MG tablet Take 1 tablet (320 mg total) by mouth daily. Patient taking differently: Take 320 mg by mouth daily with lunch. 12/14/23  Yes Chandrasekhar, Mahesh A, MD  aspirin  EC 81 MG tablet Take 1 tablet (81 mg total) by mouth daily. Patient not taking: Reported on 01/29/2024 11/23/12   Delford Maude BROCKS, MD  Blood Glucose Monitoring Suppl (ONE TOUCH ULTRA 2) w/Device KIT CHECK BLOOD SUGAR TWICE DAILY.  DX CODE  E11.9 08/23/19   Antonio Cyndee Rockers  R, DO  furosemide  (LASIX ) 20 MG tablet TAKE ONE-HALF TABLET BY MOUTH EVERY DAY Patient not taking: Reported on 01/29/2024 03/17/23   Delford Maude BROCKS, MD  glucose blood (ONETOUCH ULTRA TEST) test strip Use as instructed 09/08/23   Antonio Meth, Jamee SAUNDERS, DO  Lancets (ONETOUCH DELICA PLUS [place]) MISC USE 1 LANCET TO TEST AS DIRECTED (DISCARD LANCET IN SHARPS CONTAINER IMMEDIATELY AFTER USE) 09/08/23   Antonio Meth Jamee SAUNDERS, DO  nebivolol  (BYSTOLIC ) 5 MG tablet TAKE ONE TABLET BY MOUTH EVERY DAY Patient  not taking: Reported on 01/29/2024 06/30/23   Delford Maude BROCKS, MD  Semaglutide  (RYBELSUS ) 7 MG TABS Take 1 tablet (7 mg total) by mouth daily. Patient not taking: Reported on 01/29/2024 09/08/23   Antonio Meth Jamee SAUNDERS, DO     Family History  Problem Relation Age of Onset   Hypertension Mother    Heart disease Mother        Had a stent   Emphysema Father    Diabetes Father    COPD Father    Stroke Sister    Breast cancer Neg Hx     Social History   Socioeconomic History   Marital status: Married    Spouse name: Not on file   Number of children: Not on file   Years of education: Not on file   Highest education level: Associate degree: occupational, scientist, product/process development, or vocational program  Occupational History   Occupation: hairdresser  Tobacco Use   Smoking status: Never   Smokeless tobacco: Never  Vaping Use   Vaping status: Never Used  Substance and Sexual Activity   Alcohol use: No    Alcohol/week: 0.0 standard drinks of alcohol   Drug use: No   Sexual activity: Not Currently    Partners: Male    Birth control/protection: Surgical, Abstinence    Comment: hysterectomy, 17, less than 5  Other Topics Concern   Not on file  Social History Narrative   Not on file   Social Drivers of Health   Financial Resource Strain: Low Risk  (12/07/2023)   Overall Financial Resource Strain (CARDIA)    Difficulty of Paying Living Expenses: Not hard at all  Food Insecurity: Patient Declined (01/30/2024)   Hunger Vital Sign    Worried About Running Out of Food in the Last Year: Patient declined    Ran Out of Food in the Last Year: Patient declined  Transportation Needs: No Transportation Needs (01/30/2024)   PRAPARE - Administrator, Civil Service (Medical): No    Lack of Transportation (Non-Medical): No  Physical Activity: Insufficiently Active (12/07/2023)   Exercise Vital Sign    Days of Exercise per Week: 1 day    Minutes of Exercise per Session: 10 min  Stress: No Stress  Concern Present (12/07/2023)   [employer] of Occupational Health - Occupational Stress Questionnaire    Feeling of Stress : Only a little  Social Connections: Socially Integrated (01/30/2024)   Social Connection and Isolation Panel [NHANES]    Frequency of Communication with Friends and Family: More than three times a week    Frequency of Social Gatherings with Friends and Family: More than three times a week    Attends Religious Services: More than 4 times per year    Active Member of [employer] or Organizations: Yes    Attends Engineer, Structural: More than 4 times per year    Marital Status: Married     Review of Systems: A 12 point  ROS discussed and pertinent positives are indicated in the HPI above.  All other systems are negative.  Review of Systems  Constitutional:  Positive for fatigue. Negative for fever.  Respiratory:  Negative for cough and shortness of breath.   Cardiovascular:  Negative for chest pain.  Gastrointestinal:  Negative for abdominal pain, diarrhea, nausea and vomiting.  Musculoskeletal:  Negative for back pain.  Psychiatric/Behavioral:  Negative for behavioral problems and confusion.     Vital Signs: BP (!) 143/62 (BP Location: Left Arm)   Pulse 81   Temp 98.5 F (36.9 C) (Oral)   Resp 18   Ht 5' 1 (1.549 m)   Wt 115 lb 4.8 oz (52.3 kg)   SpO2 96%   BMI 21.79 kg/m   Physical Exam Vitals and nursing note reviewed.  Constitutional:      General: She is not in acute distress.    Appearance: Normal appearance. She is not ill-appearing.  HENT:     Mouth/Throat:     Mouth: Mucous membranes are moist.     Pharynx: Oropharynx is clear.  Cardiovascular:     Rate and Rhythm: Normal rate and regular rhythm.  Pulmonary:     Effort: Pulmonary effort is normal.     Breath sounds: Normal breath sounds.  Abdominal:     General: Abdomen is flat. There is no distension.     Palpations: Abdomen is soft.  Skin:    General: Skin is warm and dry.   Neurological:     General: No focal deficit present.     Mental Status: She is alert and oriented to person, place, and time.  Psychiatric:        Mood and Affect: Mood normal.        Behavior: Behavior normal.        Thought Content: Thought content normal.        Judgment: Judgment normal.      MD Evaluation Airway: WNL Heart: WNL Abdomen: WNL Chest/ Lungs: WNL ASA  Classification: 3 Mallampati/Airway Score: Two   Imaging: VAS US  RENAL ARTERY DUPLEX Result Date: 01/11/2024 ABDOMINAL VISCERAL Patient Name:  Cynthia Burns  Date of Exam:   01/08/2024 Medical Rec #: [id number]        Accession #:    [id number] Date of Birth: Aug 28, 1946       Patient Gender: F Patient Age:   89 years Exam Location:  [facility] Procedure:      VAS US  RENAL ARTERY DUPLEX Referring Phys: [id number] [facility] A CHANDRASEKHAR -------------------------------------------------------------------------------- Indications: Hypertensive urgency. Patient denies any abdominal pain. High Risk Factors: Hypertension, hyperlipidemia, Diabetes, no history of                    smoking. Limitations: Air/bowel gas. Comparison Study: NA Performing Technologist: Nanetta Shad RVT  Examination Guidelines: A complete evaluation includes B-mode imaging, spectral Doppler, color Doppler, and power Doppler as needed of all accessible portions of each vessel. Bilateral testing is considered an integral part of a complete examination. Limited examinations for reoccurring indications may be performed as noted.  Duplex Findings: +--------------------+--------+--------+------+---------------+ Mesenteric          PSV cm/sEDV cm/sPlaque   Comments     +--------------------+--------+--------+------+---------------+ Aorta Prox            118                 1.8 cm x 1.8 cm +--------------------+--------+--------+------+---------------+ Aorta Distal  119                                  +--------------------+--------+--------+------+---------------+ Celiac Artery Origin  145      25                         +--------------------+--------+--------+------+---------------+ SMA Origin             85      7                          +--------------------+--------+--------+------+---------------+  +------------------+--------+--------+-------+ Right Renal ArteryPSV cm/sEDV cm/sComment +------------------+--------+--------+-------+ Origin              112      13           +------------------+--------+--------+-------+ Proximal            137      19           +------------------+--------+--------+-------+ Mid                 124      30           +------------------+--------+--------+-------+ Distal               90      19           +------------------+--------+--------+-------+ +-----------------+--------+--------+-------+ Left Renal ArteryPSV cm/sEDV cm/sComment +-----------------+--------+--------+-------+ Origin             107      28           +-----------------+--------+--------+-------+ Proximal           124      19           +-----------------+--------+--------+-------+ Mid                 82      13           +-----------------+--------+--------+-------+ Distal              65      15           +-----------------+--------+--------+-------+ +------------+--------+--------+----+-----------+--------+--------+----+ Right KidneyPSV cm/sEDV cm/sRI  Left KidneyPSV cm/sEDV cm/sRI   +------------+--------+--------+----+-----------+--------+--------+----+ Upper Pole  27      6       0.78Upper Pole 27      6       0.76 +------------+--------+--------+----+-----------+--------+--------+----+ Mid         27      6       0.        31      7       0.78 +------------+--------+--------+----+-----------+--------+--------+----+ Lower Pole  22      5       0.76Lower Pole 26      5       0.80  +------------+--------+--------+----+-----------+--------+--------+----+ Hilar       50      9       0.82Hilar      34      7       0.79 +------------+--------+--------+----+-----------+--------+--------+----+ +------------------+------+------------------+------+ Right Kidney            Left Kidney              +------------------+------+------------------+------+ RAR  RAR                      +------------------+------+------------------+------+ RAR (manual)      1.16  RAR (manual)      1.05   +------------------+------+------------------+------+ Cortex            .74 cmCortex            .72 cm +------------------+------+------------------+------+ Cortex thickness        Corex thickness          +------------------+------+------------------+------+ Kidney length (cm)9.32  Kidney length (cm)9.42   +------------------+------+------------------+------+  Summary: Largest Aortic Diameter: 1.8 cm  Renal:  Right: Normal size right kidney. Abnormal right Resistive Index.        Normal cortical thickness of right kidney. No evidence of        right renal artery stenosis. RRV flow present. Extrarenal        pelvis. Left:  Normal size of left kidney. Abnormal left Resistive Index.        Normal cortical thickness of the left kidney. No evidence of        left renal artery stenosis. LRV flow present. Extrarenal        pelvis. Mesenteric: Normal Celiac artery and Superior Mesenteric artery findings.  Patent IVC.  *See table(s) above for measurements and observations.  Diagnosing physician: Deatrice Cage MD  Electronically signed by Deatrice Cage MD on 01/11/2024 at 7:46:39 AM.    Final     Labs:  CBC: Recent Labs    12/03/23 1139 01/29/24 1830 01/29/24 1841 01/30/24 0555 01/31/24 0743  WBC 8.0 8.4  --  10.9* 15.0*  HGB 11.0* 7.5* 7.8* 8.0* 8.0*  HCT 31.4* 23.1* 23.0* 24.1* 24.5*  PLT 188.0 155  --  139* 140*    COAGS: No results for input(s): INR,  APTT in the last 8760 hours.  BMP: Recent Labs    12/08/23 1507 01/29/24 1830 01/29/24 1841 01/30/24 0555 01/31/24 0743  NA 129* 129* 136 128* 129*  K 4.1 4.5 4.6 4.5 4.1  CL 102 104 106 108 109  CO2 24 18*  --  17* 16*  GLUCOSE 86 106* 108* 142* 132*  BUN 33* 28* 28* 28* 23  CALCIUM  8.8 9.1  --  8.6* 8.1*  CREATININE 1.67* 1.80* 2.10* 1.86* 1.50*  GFRNONAA  --  29*  --  28* 36*    LIVER FUNCTION TESTS: Recent Labs    12/08/23 1507 01/29/24 1830 01/30/24 0555 01/31/24 0743  BILITOT 0.4 0.6 0.6 0.3  AST 16 30 22 19   ALT 16 40 30 24  ALKPHOS 27* 26* 22* 23*  PROT 13.5* >12.0* 11.0* 10.5*  ALBUMIN 3.2* 2.9* 2.5* 2.5*    TUMOR MARKERS: No results for input(s): AFPTM, CEA, CA199, CHROMGRNA in the last 8760 hours.  Assessment and Plan: Symptomatic anemia, worsening renal function, abnormal labs Patient with ongoing work-up for abnormal labs.  Admitted for symptomatic anemia requiring blood transfusion.  Due to worsening renal function, anemia, and elevated M spike there is concern for multiple myeloma. IR consulted for bone marrow biopsy.   Discussed with patient and family who are agreeable to proceed.   Risks and benefits of biopsy was discussed with the patient and/or patient's family including, but not limited to bleeding, infection, damage to adjacent structures or low yield requiring additional tests.  All of the questions were answered and there is agreement to proceed.  Consent signed and in chart.   Thank you  for this interesting consult.  I greatly enjoyed meeting Cynthia Burns and look forward to participating in their care.  A copy of this report was sent to the requesting provider on this date.  Electronically Signed: Shala Baumbach Sue-Ellen Kalana Yust, PA 01/31/2024, 12:51 PM   I spent a total of 20 Minutes    in face to face in clinical consultation, greater than 50% of which was counseling/coordinating care for proteinuria, symptomatic amenia,  abnormal labs.

## 2024-01-31 NOTE — Plan of Care (Signed)
  Problem: Coping: Goal: Ability to adjust to condition or change in health will improve Outcome: Progressing   Problem: Fluid Volume: Goal: Ability to maintain a balanced intake and output will improve Outcome: Progressing   Problem: Health Behavior/Discharge Planning: Goal: Ability to identify and utilize available resources and services will improve Outcome: Progressing Goal: Ability to manage health-related needs will improve Outcome: Progressing   Problem: Clinical Measurements: Goal: Will remain free from infection Outcome: Progressing Goal: Respiratory complications will improve Outcome: Progressing Goal: Cardiovascular complication will be avoided Outcome: Progressing   Problem: Activity: Goal: Risk for activity intolerance will decrease Outcome: Progressing

## 2024-01-31 NOTE — Progress Notes (Signed)
 PROGRESS NOTE    Cynthia Burns  FMW:[id number]  DOB: 03/30/1946  DOA: 01/29/2024 PCP: Antonio Cyndee Jamee JONELLE, DO Outpatient Specialists:   Hospital course:  78 year old with DM2, HTN, CKD 3B who was admitted per oncology request for management of new diagnosis of multiple myeloma.  She was treated with IV fluids, 1 unit PRBC, dexamethasone  and scheduled for CT-guided biopsies.  Subjective:  Patient and family are very upset that nurse told her that she had protein in her urine and it was much higher than it should be.  Notes that it disturbed her a lot but was reassured when I told her this was to be expected given her diagnosis.  Patient and family agree that she is very anxious.  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  Has not been on any other antianxiety medications   Objective: Vitals:   01/30/24 2048 01/31/24 0500 01/31/24 0509 01/31/24 1340  BP: 138/81  (!) 143/62 (!) 148/72  Pulse: 76  81 85  Resp: 16  18 18   Temp: 98.1 F (36.7 C)  98.5 F (36.9 C) 98.3 F (36.8 C)  TempSrc: Oral  Oral Oral  SpO2: 96%  96% 100%  Weight:  52.3 kg    Height:        Intake/Output Summary (Last 24 hours) at 01/31/2024 1643 Last data filed at 01/31/2024 1500 Gross per 24 hour  Intake 3959.5 ml  Output 2000 ml  Net 1959.5 ml   Filed Weights   01/29/24 1622 01/30/24 1242 01/31/24 0500  Weight: 51 kg 50.3 kg 52.3 kg     Exam:  General: Anxious patient sitting up in bed with family at bedside Eyes: sclera anicteric, conjuctiva mild injection bilaterally CVS: S1-S2, regular  Respiratory:  decreased air entry bilaterally secondary to decreased inspiratory effort, rales at bases  GI: NABS, soft, NT  LE: Warm and well-perfused Neuro: A/O x 3,  grossly nonfocal.  Psych: patient is logical and coherent, judgement and insight appear normal, mood and affect appropriate to situation.  Data Reviewed:  Basic  Metabolic Panel: Recent Labs  Lab 01/29/24 1830 01/29/24 1841 01/29/24 2030 01/30/24 0555 01/31/24 0743  NA 129* 136  --  128* 129*  K 4.5 4.6  --  4.5 4.1  CL 104 106  --  108 109  CO2 18*  --   --  17* 16*  GLUCOSE 106* 108*  --  142* 132*  BUN 28* 28*  --  28* 23  CREATININE 1.80* 2.10*  --  1.86* 1.50*  CALCIUM  9.1  --   --  8.6* 8.1*  MG  --   --  2.0  --   --   PHOS  --   --  5.1*  --   --     CBC: Recent Labs  Lab 01/29/24 1830 01/29/24 1841 01/30/24 0555 01/31/24 0743  WBC 8.4  --  10.9* 15.0*  NEUTROABS 6.0  --   --  11.9*  HGB 7.5* 7.8* 8.0* 8.0*  HCT 23.1* 23.0* 24.1* 24.5*  MCV 97.9  --  95.3 96.1  PLT 155  --  139* 140*     Scheduled Meds:  sodium chloride    Intravenous Once   dexamethasone  (DECADRON ) injection  20 mg Intravenous Q24H   hydrALAZINE   50 mg Oral BID AC   insulin  aspart  0-15 Units Subcutaneous TID WC   insulin  aspart  0-5 Units Subcutaneous  QHS   irbesartan   300 mg Oral Daily   levothyroxine   25 mcg Oral Q0600   multivitamin  1 tablet Oral Q lunch   pantoprazole   40 mg Oral BID AC   rosuvastatin   20 mg Oral Q supper   Continuous Infusions:     Assessment & Plan:   MM, new diagnosis Patient admitted for treatment and workup per medical oncology Presently getting IV fluids to keep urine output greater than 2 L a day Is s/p 1 unit PRBC, hemoglobin increased from 7.5 up to 8.0, this is stable on repeat Is on dexamethasone  20 mg every 24 Skeletal survey, CT-guided bone marrow aspiration and myeloma labs are ordered and pending Being followed by Dr. Onesimo, oncology  Anxiety Sounds like patient likely has generalized anxiety but is clearly having acute situational anxiety in house Has history of dependence and difficulty getting off of benzodiazepines in the past Will give her clonazepam  0.5 mg daily x 3 doses only, would not continue this upon discharge Family to decide whether she would like to start on Zoloft or not, she is  worried about medical intolerance/allergies Would start Zoloft if family is agreeable  Acute on chronic CKD 3B Chronic hyponatremia Creatinine improved with hydration Patient has lower sodiums dating back to 2024 however she may also have some level of pseudohyponatremia Followed by [facility], avoid nephrotoxins  DM2 Will need to follow sugars closely on dexamethasone  Present they are under good control on SSI  GERD/esophagitis Continue Protonix   HTN Well-controlled on home medications  Hypothyroidism Continue Synthroid      DVT prophylaxis: SCD Code Status: Full Family Communication: Daughter and son were at bedside throughout     Studies: No results found.  Principal Problem:   Symptomatic anemia Active Problems:   Multiple myeloma (HCC)   CKD (chronic kidney disease) stage 4, GFR 15-29 ml/min (HCC)     Esiah Bazinet Vangie Pike, [facility]  If 7PM-7AM, please contact night-coverage [email or web address]   LOS: 2 days

## 2024-01-31 NOTE — Plan of Care (Signed)

## 2024-01-31 NOTE — Plan of Care (Signed)

## 2024-02-01 ENCOUNTER — Telehealth: Payer: Self-pay

## 2024-02-01 ENCOUNTER — Inpatient Hospital Stay (HOSPITAL_COMMUNITY): Payer: Medicare Other

## 2024-02-01 ENCOUNTER — Other Ambulatory Visit: Payer: Self-pay | Admitting: Family Medicine

## 2024-02-01 DIAGNOSIS — N183 Chronic kidney disease, stage 3 unspecified: Secondary | ICD-10-CM | POA: Diagnosis not present

## 2024-02-01 DIAGNOSIS — C9000 Multiple myeloma not having achieved remission: Secondary | ICD-10-CM | POA: Diagnosis not present

## 2024-02-01 DIAGNOSIS — D649 Anemia, unspecified: Secondary | ICD-10-CM | POA: Diagnosis not present

## 2024-02-01 LAB — BASIC METABOLIC PANEL
Anion gap: 4 — ABNORMAL LOW (ref 5–15)
BUN: 30 mg/dL — ABNORMAL HIGH (ref 8–23)
CO2: 17 mmol/L — ABNORMAL LOW (ref 22–32)
Calcium: 8.6 mg/dL — ABNORMAL LOW (ref 8.9–10.3)
Chloride: 107 mmol/L (ref 98–111)
Creatinine, Ser: 1.58 mg/dL — ABNORMAL HIGH (ref 0.44–1.00)
GFR, Estimated: 34 mL/min — ABNORMAL LOW (ref >60)
Glucose, Bld: 142 mg/dL — ABNORMAL HIGH (ref 70–99)
Potassium: 4 mmol/L (ref 3.5–5.1)
Sodium: 128 mmol/L — ABNORMAL LOW (ref 135–145)

## 2024-02-01 LAB — KAPPA/LAMBDA LIGHT CHAINS
Kappa free light chain: 10.6 mg/L (ref 3.3–19.4)
Kappa, lambda light chain ratio: 0.03 — ABNORMAL LOW (ref 0.26–1.65)
Lambda free light chains: 372 mg/L — ABNORMAL HIGH (ref 5.7–26.3)

## 2024-02-01 LAB — TYPE AND SCREEN
ABO/RH(D): A POS
Antibody Screen: NEGATIVE
Unit division: 0

## 2024-02-01 LAB — CBC WITH DIFFERENTIAL/PLATELET
Abs Immature Granulocytes: 0.27 10*3/uL — ABNORMAL HIGH (ref 0.00–0.07)
Basophils Absolute: 0 10*3/uL (ref 0.0–0.1)
Basophils Relative: 0 %
Eosinophils Absolute: 0 10*3/uL (ref 0.0–0.5)
Eosinophils Relative: 0 %
HCT: 24.8 % — ABNORMAL LOW (ref 36.0–46.0)
Hemoglobin: 8 g/dL — ABNORMAL LOW (ref 12.0–15.0)
Immature Granulocytes: 2 %
Lymphocytes Relative: 13 %
Lymphs Abs: 1.7 10*3/uL (ref 0.7–4.0)
MCH: 31 pg (ref 26.0–34.0)
MCHC: 32.3 g/dL (ref 30.0–36.0)
MCV: 96.1 fL (ref 80.0–100.0)
Monocytes Absolute: 0.7 10*3/uL (ref 0.1–1.0)
Monocytes Relative: 6 %
Neutro Abs: 10.2 10*3/uL — ABNORMAL HIGH (ref 1.7–7.7)
Neutrophils Relative %: 79 %
Platelets: 142 10*3/uL — ABNORMAL LOW (ref 150–400)
RBC: 2.58 MIL/uL — ABNORMAL LOW (ref 3.87–5.11)
RDW: 16.9 % — ABNORMAL HIGH (ref 11.5–15.5)
WBC: 12.8 10*3/uL — ABNORMAL HIGH (ref 4.0–10.5)
nRBC: 0 % (ref 0.0–0.2)

## 2024-02-01 LAB — BPAM RBC
Blood Product Expiration Date: 202503042359
ISSUE DATE / TIME: 202502072123
Unit Type and Rh: 6200

## 2024-02-01 LAB — GLUCOSE, CAPILLARY
Glucose-Capillary: 115 mg/dL — ABNORMAL HIGH (ref 70–99)
Glucose-Capillary: 86 mg/dL (ref 70–99)
Glucose-Capillary: 147 mg/dL — ABNORMAL HIGH (ref 70–99)
Glucose-Capillary: 126 mg/dL — ABNORMAL HIGH (ref 70–99)
Glucose-Capillary: 85 mg/dL (ref 70–99)

## 2024-02-01 MED ORDER — MIDAZOLAM HCL 2 MG/2ML IJ SOLN
INTRAMUSCULAR | Status: AC | PRN
  Administered 2024-02-01: .5 mg via INTRAVENOUS
  Administered 2024-02-01: 1 mg via INTRAVENOUS

## 2024-02-01 MED ORDER — MIDAZOLAM HCL 2 MG/2ML IJ SOLN
INTRAMUSCULAR | Status: AC
  Filled 2024-02-01: qty 4

## 2024-02-01 MED ORDER — NALOXONE HCL 0.4 MG/ML IJ SOLN
INTRAMUSCULAR | Status: AC
Start: 2024-02-01 — End: ?
  Filled 2024-02-01: qty 1

## 2024-02-01 MED ORDER — FENTANYL CITRATE (PF) 100 MCG/2ML IJ SOLN
INTRAMUSCULAR | Status: AC
  Filled 2024-02-01: qty 4

## 2024-02-01 MED ORDER — FLUMAZENIL 0.5 MG/5ML IV SOLN
INTRAVENOUS | Status: AC
  Filled 2024-02-01: qty 5

## 2024-02-01 MED ORDER — DEXAMETHASONE SODIUM PHOSPHATE 10 MG/ML IJ SOLN
20.0000 mg | Freq: Once | INTRAMUSCULAR | Status: AC
  Administered 2024-02-01: 20 mg via INTRAVENOUS
  Filled 2024-02-01: qty 2

## 2024-02-01 MED ORDER — FENTANYL CITRATE (PF) 100 MCG/2ML IJ SOLN
INTRAMUSCULAR | Status: AC | PRN
  Administered 2024-02-01: 50 ug via INTRAVENOUS

## 2024-02-01 NOTE — Procedures (Signed)
  Procedure:  CT bone marrow biopsy R iliac Preprocedure diagnosis: The primary encounter diagnosis was Multiple myeloma, remission status unspecified (HCC). Diagnoses of AKI (acute kidney injury) (HCC) and Anemia, unspecified type were also pertinent to this visit. Postprocedure diagnosis: same EBL:    minimal Complications:   none immediate  See full dictation in [employer].  Nicky Barrack MD Main # [phone] Pager  [phone] Mobile [phone]

## 2024-02-01 NOTE — Telephone Encounter (Signed)
  Follow up Call-     01/29/2024    7:49 AM 01/29/2024    7:38 AM  Call back number  Post procedure Call Back phone  # [phone]  Pt/Husband  [phone]   Permission to leave phone message  Yes     Patient questions:  Do you have a fever, pain , or abdominal swelling? No. Pain Score  0 *  Have you tolerated food without any problems? Yes.    Have you been able to return to your normal activities? Yes.    Do you have any questions about your discharge instructions: Diet   No. Medications  No. Follow up visit  No.  Do you have questions or concerns about your Care? No.  Actions: * If pain score is 4 or above: No action needed, pain <4.

## 2024-02-01 NOTE — Plan of Care (Signed)

## 2024-02-01 NOTE — Telephone Encounter (Signed)
 Copied from CRM [id number]. Topic: Clinical - Medication Refill >> Feb 01, 2024  8:36 AM Albertha Alosa wrote: Most Recent Primary Care Visit:  Provider: Roel Clarity R  Department: [facility]  Visit Type: OFFICE VISIT  Date: 12/08/2023  Medication: valsartan  (DIOVAN ) 320 MG tablet   Has the patient contacted their pharmacy? Yes (Agent: If no, request that the patient contact the pharmacy for the refill. If patient does not wish to contact the pharmacy document the reason why and proceed with request.) (Agent: If yes, when and what did the pharmacy advise?)  Is this the correct pharmacy for this prescription? No If no, delete pharmacy and type the correct one.  This is the patient's preferred pharmacy:  [facility]   [facility], [address], [place], [place] [address].   Has the prescription been filled recently? No  Is the patient out of the medication? Yes  Has the patient been seen for an appointment in the last year OR does the patient have an upcoming appointment? Yes  Can we respond through MyChart? Yes  Agent: Please be advised that Rx refills may take up to 3 business days. We ask that you follow-up with your pharmacy.

## 2024-02-01 NOTE — Progress Notes (Signed)
  PROGRESS NOTE  Cynthia Burns  ZOX:[id number]  DOB: 04-26-1946  DOA: 01/29/2024 PCP: Estill Hemming, DO  Hospital course: 78 year old with DM2, HTN, CKD 3B who was admitted per oncology request for management of new diagnosis of multiple myeloma.  She was treated with IV fluids, 1 unit PRBC, dexamethasone  and scheduled for CT-guided biopsies.  Subjective: Pt reports that she is feeling alright today. She and husband are anxious to get results of biopsy and start treatment.   Objective: Blood pressure (!) 151/78, pulse 64, temperature 98 F (36.7 C), resp. rate 17, height 5\' 1"  (1.549 m), weight 53.5 kg, SpO2 99%.  Exam: General: Anxious patient sitting up in bed Eyes: sclera anicteric, conjuctiva mild injection bilaterally CVS: S1-S2, regular  Respiratory: normal effort GI: soft, NT  LE: Warm and well-perfused Neuro: A/O x 3,  grossly nonfocal.  Psych: patient is logical and coherent, judgement and insight appear normal, mood and affect appropriate to situation.  Data Reviewed:  Basic Metabolic Panel: Recent Labs  Lab 01/29/24 1830 01/29/24 1841 01/29/24 2030 01/30/24 0555 01/31/24 0743  NA 129* 136  --  128* 129*  K 4.5 4.6  --  4.5 4.1  CL 104 106  --  108 109  CO2 18*  --   --  17* 16*  GLUCOSE 106* 108*  --  142* 132*  BUN 28* 28*  --  28* 23  CREATININE 1.80* 2.10*  --  1.86* 1.50*  CALCIUM  9.1  --   --  8.6* 8.1*  MG  --   --  2.0  --   --   PHOS  --   --  5.1*  --   --     CBC: Recent Labs  Lab 01/29/24 1830 01/29/24 1841 01/30/24 0555 01/31/24 0743 02/01/24 0557  WBC 8.4  --  10.9* 15.0* 12.8*  NEUTROABS 6.0  --   --  11.9* 10.2*  HGB 7.5* 7.8* 8.0* 8.0* 8.0*  HCT 23.1* 23.0* 24.1* 24.5* 24.8*  MCV 97.9  --  95.3 96.1 96.1  PLT 155  --  139* 140* 142*   Assessment & Plan:   MM, new diagnosis Patient admitted for treatment and workup per medical oncology s/p 1 unit PRBC. Hgb remains stable now.  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 Na+ 128.  - continue to f/u with Dr. Zelda Hickman outpatient  DM2 - continue SSI while on steroid dose  GERD/esophagitis Continue Protonix   HTN- moderately well-controlled on home medications  Hypothyroidism Continue Synthroid    DVT prophylaxis: SCD Code Status: Full Family Communication: husband at bedside   Studies: No results found.  Principal Problem:   Symptomatic anemia Active Problems:   Multiple myeloma (HCC)   CKD (chronic kidney disease) stage 4, GFR 15-29 ml/min (HCC)     Altair Stanko Bennetta Braun, [facility]  If 7PM-7AM, please contact night-coverage [email or web address]   LOS: 3 days

## 2024-02-01 NOTE — Progress Notes (Addendum)
 1200-  post surgical vs begun and in chart     site assessed and no distress noted at this time bp elevated  medicated per order, monitor vs as ordered  1344- called  [phone] concerning raised location on pt back near insertion site  dr.hassell  PA kevin allred to be notified and will return call  Mckenzie Rn   IR notified concerning small raised area near insertion site and small amt of blood on band aid,   pt reports no pain  no distress vitals stable  mckenzie Rn will be by to assess after case Awaiting PA from Radiology  1410- Pa allred at bedside   assessed site, noted no concerns   stated site was a little swollen downstairs and ice pack could be placed  pt refused  pt states no pain   no bleeding at this time   pt daughter and husband remain at bedside   pt in good spirits   safety measures in place   1620-  pt calm in bed   daughter and husband remain at bedside   no acute distress noted   no bleeding or further swelling from site. Pt states she is in no pain  safety measures remain in place   1936-  pt calm resting in bed  alert and oriented  no acute distress noted  husband at bedside    all poc and site information relayed to Anesha oncoming Rn   safety measures in place  handoff report completed at bedside with Serena Dana

## 2024-02-01 NOTE — Progress Notes (Addendum)
 Cynthia Burns   DOB:[birth year]   WG#:[id number]      ASSESSMENT & PLAN:  1.  Myeloma - Highly suspicious for multiple myeloma.  M spike 6.1 with kappa light chains 400s. - Newly diagnosed.  With rapidly worsening hemoglobin and renal insufficiency with proteinuria. - Patient is status post bone marrow biopsy done today.  Will follow-up on pathology results. - Status post bone survey done today with no acute bony abnormality or focal lytic lesion. - IV dexamethasone  every 24 hours initiated on 01/30/2024 x 3 doses. - Medical oncology Dr. Salomon Cree or Dr. Scherrie Curt will follow patient closely.  Patient and family will inform staff of decision.  2.  AKI/renal insufficiency - Creatinine elevated with BUN elevated - May be due to multiple myeloma - Will continue to follow levels  3.  Anemia - Hemoglobin low 8.0 today - May be due to myeloma - Transfuse PRBC for hemoglobin <7.0.  No transfusional intervention warranted at this time. - Continue to monitor CBC with differential  4.  Fatigue - Patient complains of fatigue with unexpected weight loss. - May be due to malignancy.  Code Status Full   Subjective:  Patient seen awake and alert laying supine in bed.  Patient's spouse and daughter at bedside.  Patient is seen eating lunch, reports that she just had bone marrow biopsy done and CAT scan was done earlier.  States she feels fine.  Patient and family discussing choice of oncologist, patient leaning towards Dr. Salomon Cree however family state [facility] may be closer.  Asking if they can have a little more time to decide.  Objective:  Vitals:   02/01/24 1202 02/01/24 1245  BP: (!) 151/78 (!) 164/64  Pulse: 64 62  Resp: 17 17  Temp: 98 F (36.7 C)   SpO2: 99% 99%     Intake/Output Summary (Last 24 hours) at 02/01/2024 1255 Last data filed at 02/01/2024 0831 Gross per 24 hour  Intake 2500 ml  Output --  Net 2500 ml     REVIEW OF SYSTEMS:   Constitutional: Denies fevers, chills or  abnormal night sweats Eyes: Denies blurriness of vision, double vision or watery eyes Ears, nose, mouth, throat, and face: Denies mucositis or sore throat Respiratory: Denies cough, dyspnea or wheezes Cardiovascular: Denies palpitation, chest discomfort or lower extremity swelling Gastrointestinal:  Denies nausea, heartburn or change in bowel habits Skin: Denies abnormal skin rashes Lymphatics: Denies new lymphadenopathy or easy bruising Neurological: Denies numbness, tingling or new weaknesses Behavioral/Psych: Mood is stable, no new changes  All other systems were reviewed with the patient and are negative.  PHYSICAL EXAMINATION: ECOG PERFORMANCE STATUS: 1 - Symptomatic but completely ambulatory  Vitals:   02/01/24 1202 02/01/24 1245  BP: (!) 151/78 (!) 164/64  Pulse: 64 62  Resp: 17 17  Temp: 98 F (36.7 C)   SpO2: 99% 99%   Filed Weights   01/30/24 1242 01/31/24 0500 02/01/24 0703  Weight: 111 lb (50.3 kg) 115 lb 4.8 oz (52.3 kg) 117 lb 15.8 oz (53.5 kg)    GENERAL: alert, no distress and comfortable SKIN: skin color, texture, turgor are normal, no rashes or significant lesions EYES: normal, conjunctiva are pink and non-injected, sclera clear OROPHARYNX: no exudate, no erythema and lips, buccal mucosa, and tongue normal  NECK: supple, thyroid  normal size, non-tender, without nodularity LYMPH: no palpable lymphadenopathy in the cervical, axillary or inguinal LUNGS: clear to auscultation and percussion with normal breathing effort HEART: regular rate & rhythm and no murmurs and  no lower extremity edema ABDOMEN: abdomen soft, non-tender and normal bowel sounds MUSCULOSKELETAL: no cyanosis of digits and no clubbing  PSYCH: alert & oriented x 3 with fluent speech NEURO: no focal motor/sensory deficits   All questions were answered. The patient knows to call the clinic with any problems, questions or concerns.   The total time spent in the appointment was 45 minutes  encounter with patient including review of chart and various tests results, discussions about plan of care and coordination of care plan  Jacqualin Mate, NP 02/01/2024 12:55 PM    Labs Reviewed:  Lab Results  Component Value Date   WBC 12.8 (H) 02/01/2024   HGB 8.0 (L) 02/01/2024   HCT 24.8 (L) 02/01/2024   MCV 96.1 02/01/2024   PLT 142 (L) 02/01/2024   Recent Labs    01/29/24 1830 01/29/24 1841 01/30/24 0555 01/31/24 0743 02/01/24 0557  NA 129*   < > 128* 129* 128*  K 4.5   < > 4.5 4.1 4.0  CL 104   < > 108 109 107  CO2 18*  --  17* 16* 17*  GLUCOSE 106*   < > 142* 132* 142*  BUN 28*   < > 28* 23 30*  CREATININE 1.80*   < > 1.86* 1.50* 1.58*  CALCIUM  9.1  --  8.6* 8.1* 8.6*  GFRNONAA 29*  --  28* 36* 34*  PROT >12.0*  --  11.0* 10.5*  --   ALBUMIN 2.9*  --  2.5* 2.5*  --   AST 30  --  22 19  --   ALT 40  --  30 24  --   ALKPHOS 26*  --  22* 23*  --   BILITOT 0.6  --  0.6 0.3  --    < > = values in this interval not displayed.    Studies Reviewed:  DG Bone Survey Met Result Date: 02/01/2024 CLINICAL DATA:  Myeloma EXAM: METASTATIC BONE SURVEY COMPARISON:  None Available. FINDINGS: No focal lytic lesion or acute bony abnormality. Degenerative changes in the [facility] joints bilaterally. Degenerative disc and facet disease in the cervical spine. Degenerative facet disease in the lower lumbar spine. Lungs are clear.  No effusions.  Heart is normal size. IMPRESSION: No acute bony abnormality or focal lytic lesion. Electronically Signed   By: Janeece Mechanic M.D.   On: 02/01/2024 12:44   CT BONE MARROW BIOPSY & ASPIRATION Result Date: 02/01/2024 CLINICAL DATA:  Myeloma EXAM: CT GUIDED DEEP ILIAC BONE ASPIRATION AND CORE BIOPSY TECHNIQUE: Patient was placed prone on the CT gantry and limited axial scans through the pelvis were obtained. This exam was performed according to the departmental dose-optimization program which includes automated exposure control, adjustment of the mA and/or kV  according to patient size and/or use of iterative reconstruction technique. Appropriate skin entry site was identified. Skin site was marked, prepped with chlorhexidine, draped in usual sterile fashion, and infiltrated locally with 1% lidocaine. Intravenous Fentanyl  50mcg and Versed  1.5mg  were administered by RN during a total moderate (conscious) sedation time of 11 minutes; the patient's level of consciousness and physiological / cardiorespiratory status were monitored continuously by radiology RN under my direct supervision. Under CT fluoroscopic guidance an 11-gauge Cook trocar bone needle was advanced into the right iliac bone just lateral to the sacroiliac joint. Once needle tip position was confirmed, core and aspiration samples were obtained, submitted to pathology for approval. Patient tolerated procedure well. COMPLICATIONS: COMPLICATIONS none IMPRESSION: 1. Technically successful  CT guided right iliac bone core and aspiration biopsy. Electronically Signed   By: Nicoletta Barrier M.D.   On: 02/01/2024 12:13   VAS US  RENAL ARTERY DUPLEX Result Date: 01/11/2024 ABDOMINAL VISCERAL Patient Name:  AREISY DANZ  Date of Exam:   01/08/2024 Medical Rec #: [id number]        Accession #:    [id number] Date of Birth: June 04, 1946       Patient Gender: F Patient Age:   78 years Exam Location:  [facility] Procedure:      VAS US  RENAL ARTERY DUPLEX Referring Phys: [id number] [facility] -------------------------------------------------------------------------------- Indications: Hypertensive urgency. Patient denies any abdominal pain. High Risk Factors: Hypertension, hyperlipidemia, Diabetes, no history of                    smoking. Limitations: Air/bowel gas. Comparison Study: NA Performing Technologist: Doren Gammons RVT  Examination Guidelines: A complete evaluation includes B-mode imaging, spectral Doppler, color Doppler, and power Doppler as needed of all accessible portions of each vessel. Bilateral testing is  considered an integral part of a complete examination. Limited examinations for reoccurring indications may be performed as noted.  Duplex Findings: +--------------------+--------+--------+------+---------------+ Mesenteric          PSV cm/sEDV cm/sPlaque   Comments     +--------------------+--------+--------+------+---------------+ Aorta Prox            118                 1.8 cm x 1.8 cm +--------------------+--------+--------+------+---------------+ Aorta Distal          119                                 +--------------------+--------+--------+------+---------------+ Celiac Artery Origin  145      25                         +--------------------+--------+--------+------+---------------+ SMA Origin             85      7                          +--------------------+--------+--------+------+---------------+  +------------------+--------+--------+-------+ Right Renal ArteryPSV cm/sEDV cm/sComment +------------------+--------+--------+-------+ Origin              112      13           +------------------+--------+--------+-------+ Proximal            137      19           +------------------+--------+--------+-------+ Mid                 124      30           +------------------+--------+--------+-------+ Distal               90      19           +------------------+--------+--------+-------+ +-----------------+--------+--------+-------+ Left Renal ArteryPSV cm/sEDV cm/sComment +-----------------+--------+--------+-------+ Origin             107      28           +-----------------+--------+--------+-------+ Proximal           124      19           +-----------------+--------+--------+-------+ Mid  82      13           +-----------------+--------+--------+-------+ Distal              65      15           +-----------------+--------+--------+-------+ +------------+--------+--------+----+-----------+--------+--------+----+  Right KidneyPSV cm/sEDV cm/sRI  Left KidneyPSV cm/sEDV cm/sRI   +------------+--------+--------+----+-----------+--------+--------+----+ Upper Pole  27      6       0.78Upper Pole 27      6       0.76 +------------+--------+--------+----+-----------+--------+--------+----+ Mid         27      6       0.        31      7       0.78 +------------+--------+--------+----+-----------+--------+--------+----+ Lower Pole  22      5       0.76Lower Pole 26      5       0.80 +------------+--------+--------+----+-----------+--------+--------+----+ Hilar       50      9       0.82Hilar      34      7       0.79 +------------+--------+--------+----+-----------+--------+--------+----+ +------------------+------+------------------+------+ Right Kidney            Left Kidney              +------------------+------+------------------+------+ RAR                     RAR                      +------------------+------+------------------+------+ RAR (manual)      1.16  RAR (manual)      1.05   +------------------+------+------------------+------+ Cortex            .74 cmCortex            .72 cm +------------------+------+------------------+------+ Cortex thickness        Corex thickness          +------------------+------+------------------+------+ Kidney length (cm)9.32  Kidney length (cm)9.42   +------------------+------+------------------+------+  Summary: Largest Aortic Diameter: 1.8 cm  Renal:  Right: Normal size right kidney. Abnormal right Resistive Index.        Normal cortical thickness of right kidney. No evidence of        right renal artery stenosis. RRV flow present. Extrarenal        pelvis. Left:  Normal size of left kidney. Abnormal left Resistive Index.        Normal cortical thickness of the left kidney. No evidence of        left renal artery stenosis. LRV flow present. Extrarenal        pelvis. Mesenteric: Normal Celiac artery and  Superior Mesenteric artery findings.  Patent IVC.  *See table(s) above for measurements and observations.  Diagnosing physician: Antionette Kirks MD  Electronically signed by Antionette Kirks MD on 01/11/2024 at 7:46:39 AM.    Final    ADDENDUM  .Patient was Personally and independently interviewed, examined and relevant elements of the history of present illness were reviewed in details and an assessment and plan was created. All elements of the patient's history of present illness , assessment and plan were discussed in details with Lisa Rouson NP. The above documentation reflects our combined findings assessment and plan.   .    Latest Ref Rng & Units 02/01/2024    5:57  AM 01/31/2024    7:43 AM 01/30/2024    5:55 AM  CBC  WBC 4.0 - 10.5 K/uL 12.8  15.0  10.9   Hemoglobin 12.0 - 15.0 g/dL 8.0  8.0  8.0   Hematocrit 36.0 - 46.0 % 24.8  24.5  24.1   Platelets 150 - 400 K/uL 142  140  139    .    Latest Ref Rng & Units 02/01/2024    5:57 AM 01/31/2024    7:43 AM 01/30/2024    5:55 AM  CMP  Glucose 70 - 99 mg/dL 161  096  045   BUN 8 - 23 mg/dL 30  23  28    Creatinine 0.44 - 1.00 mg/dL 4.09  8.11  9.14   Sodium 135 - 145 mmol/L 128  129  128   Potassium 3.5 - 5.1 mmol/L 4.0  4.1  4.5   Chloride 98 - 111 mmol/L 107  109  108   CO2 22 - 32 mmol/L 17  16  17    Calcium  8.9 - 10.3 mg/dL 8.6  8.1  8.6   Total Protein 6.5 - 8.1 g/dL  78.2  95.6   Total Bilirubin 0.0 - 1.2 mg/dL  0.3  0.6   Alkaline Phos 38 - 126 U/L  23  22   AST 15 - 41 U/L  19  22   ALT 0 - 44 U/L  24  30    Bone survey 02/01/2024 - IMPRESSION: No acute bony abnormality or focal lytic lesion.  Plan Labs stable -CT bone marrow aspiration and biopsy has been completed -Bone survey neg for overt bone lytic lesions -will need outpatient PET/CT -counseling done to consume 3-4 L of water daily for now -f/u myeloma labs, UPEP and BM Bx results with Dr Salomon Cree in clinic with clinic f/u on 02/04/2024 at 11 AM -okay to discharge home  from oncology standpoint today.  Loris Seelye MD MS

## 2024-02-02 DIAGNOSIS — D649 Anemia, unspecified: Secondary | ICD-10-CM | POA: Diagnosis not present

## 2024-02-02 LAB — MULTIPLE MYELOMA PANEL, SERUM
Albumin SerPl Elph-Mcnc: 4.2 g/dL (ref 2.9–4.4)
Albumin/Glob SerPl: 0.6 — ABNORMAL LOW (ref 0.7–1.7)
Alpha 1: 0.3 g/dL (ref 0.0–0.4)
Alpha2 Glob SerPl Elph-Mcnc: 0.8 g/dL (ref 0.4–1.0)
B-Globulin SerPl Elph-Mcnc: 0.8 g/dL (ref 0.7–1.3)
Gamma Glob SerPl Elph-Mcnc: 6.5 g/dL — ABNORMAL HIGH (ref 0.4–1.8)
Globulin, Total: 8.4 g/dL — ABNORMAL HIGH (ref 2.2–3.9)
IgA: 11 mg/dL — ABNORMAL LOW (ref 64–422)
IgG (Immunoglobin G), Serum: 9232 mg/dL — ABNORMAL HIGH (ref 586–1602)
IgM (Immunoglobulin M), Srm: 12 mg/dL — ABNORMAL LOW (ref 26–217)
M Protein SerPl Elph-Mcnc: 6.5 g/dL — ABNORMAL HIGH
Total Protein ELP: 12.6 g/dL — ABNORMAL HIGH (ref 6.0–8.5)

## 2024-02-02 LAB — BASIC METABOLIC PANEL
Anion gap: 3 — ABNORMAL LOW (ref 5–15)
BUN: 33 mg/dL — ABNORMAL HIGH (ref 8–23)
CO2: 19 mmol/L — ABNORMAL LOW (ref 22–32)
Calcium: 8.1 mg/dL — ABNORMAL LOW (ref 8.9–10.3)
Chloride: 105 mmol/L (ref 98–111)
Creatinine, Ser: 1.74 mg/dL — ABNORMAL HIGH (ref 0.44–1.00)
GFR, Estimated: 30 mL/min — ABNORMAL LOW (ref >60)
Glucose, Bld: 148 mg/dL — ABNORMAL HIGH (ref 70–99)
Potassium: 4 mmol/L (ref 3.5–5.1)
Sodium: 127 mmol/L — ABNORMAL LOW (ref 135–145)

## 2024-02-02 LAB — CBC
HCT: 25.6 % — ABNORMAL LOW (ref 36.0–46.0)
Hemoglobin: 8.5 g/dL — ABNORMAL LOW (ref 12.0–15.0)
MCH: 32.3 pg (ref 26.0–34.0)
MCHC: 33.2 g/dL (ref 30.0–36.0)
MCV: 97.3 fL (ref 80.0–100.0)
Platelets: 162 10*3/uL (ref 150–400)
RBC: 2.63 MIL/uL — ABNORMAL LOW (ref 3.87–5.11)
RDW: 16.8 % — ABNORMAL HIGH (ref 11.5–15.5)
WBC: 13.3 10*3/uL — ABNORMAL HIGH (ref 4.0–10.5)
nRBC: 0 % (ref 0.0–0.2)

## 2024-02-02 LAB — GLUCOSE, CAPILLARY: Glucose-Capillary: 128 mg/dL — ABNORMAL HIGH (ref 70–99)

## 2024-02-02 LAB — SURGICAL PATHOLOGY

## 2024-02-02 MED ORDER — CLONAZEPAM 0.5 MG PO TABS: 0.5000 mg | ORAL_TABLET | Freq: Two times a day (BID) | ORAL | 0 refills | Status: DC | PRN

## 2024-02-02 MED ORDER — VALSARTAN 320 MG PO TABS: 320.0000 mg | ORAL_TABLET | Freq: Every day | ORAL | 0 refills | Status: DC

## 2024-02-02 NOTE — Progress Notes (Signed)
Received referral, appointment to see Dr Candise Che 02/04/24. Patient currently inpatient.

## 2024-02-02 NOTE — Plan of Care (Signed)

## 2024-02-02 NOTE — Progress Notes (Signed)
Patient discharged home with husband, husband reassured as he took all belongings and checked closets to make sure he had everything, explained AVS and taught about new med Clonazepam, verbalized understanding, left unit via w/c and x 1 asst, no acute distress noted

## 2024-02-02 NOTE — Discharge Summary (Signed)
 Physician Discharge Summary  Jamison Yuhasz ZHY:[id number] DOB: [birth year] DOA: 01/29/2024  PCP: Donato Schultz, DO  Admit date: 01/29/2024 Discharge date: 02/02/2024  Admitted From: Home Disposition: Home  Recommendations for Outpatient Follow-up:  Follow up with PCP in 1-2 weeks Follow-up with medical oncology, Dr. Candise Che as scheduled on 02/04/2024 Continue outpatient follow-up with [facility] gastroenterology as directed Started on clonazepam twice daily as needed for anxiety Please follow up on the following pending results: Pathology from bone marrow biopsy that was pending at time of discharge  Home Health: No Equipment/Devices: None  Discharge Condition: Stable CODE STATUS: Full code Diet recommendation: Heart healthy/consistent carb regular diet  History of present illness:  Cynthia Burns is a 78 year old female with past medical history significant for HTN, CKD stage IIIb, DM2, hypothyroidism, GERD who was sent to the ED by oncology for evaluation for multiple myeloma. Per family, patient started having some about a year ago.  She was found to have significant anemia and advancement of her CKD so she was referred to nephrologist, Dr. Thedore Mins who ordered multiple labs for further evaluation 2 months ago. Yesterday, they received a call from Dr. Diamantina Monks patient's blood work showed that she likely has multiple myeloma. The case was discussed with Dr. Truett Perna who recommends patient presents to the ED for emergent workup and treatment. Patient had EGD and colonoscopy today with no complications. Family reports that patient continues to be fatigued and has had about a 30 pound weight loss in the last year. She has also had a poor appetite over the last few months. Patient denies any dizziness, headache, palpitations, bloody stools or hematuria or back pain.   In the ED, vitals stable on arrival. Labs are significant for stable hyponatremia 129, K+ 4.5, bicarb 18, glucose 106,  BUN/creatinine 28/1.80, total protein >12.0, albumin 2.9, WBC 8.4, Hgb 7.5, platelet 155. On-call oncologist, Dr. Candise Che was consulted for evaluation. 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 Labs from 1/22 at her nephrologist's office showed negative ANA, c-ANCA and p-ANCA, elevated M spike at 6.1 (43.7%), total globin 8.5, normal free kappa light chains at 15.4, elevated free lambda light chain of 436, kappa/lambda ratio low at 0.04, and significantly elevated protein/creatinine ratio of 954.  Medical oncology was consulted and followed during hospital course.  Bone survey study with no acute bony abnormality or focal lytic lesion.  Patient underwent bone marrow biopsy by IR on 02/01/2024, pathology pending at time of discharge.  Patient received 4-day course of IV Decadron.  Has outpatient follow-up scheduled with medical oncology, Dr. Candise Che on 02/04/2024 to discuss further treatment options.  Esophagitis Gastritis Patient had an EGD and colonoscopy prior to arrival to the ED. EGD showed LA grade C reflux esophagitis, 2 cm hiatal hernia and gastritis that was biopsied.  Colonoscopy showed diverticulosis and nonbleeding external and internal hemorrhoids. Continue on Protonix 40 mg twice daily.  Outpatient follow-up with [facility] gastroenterology.  CKD stage IIIb Creatinine 1.74 at time of discharge, stable.  Continue to follow renal function closely with new diagnosis of multiple myeloma as above.  Outpatient follow-up with [facility].  Type 2 diabetes mellitus Recent hemoglobin A1c 6.2%.  On Rybelsus outpatient.  Hypothyroidism Continue levothyroxine 25 mcg  p.o.  daily  Hyperlipidemia Continue Crestor 20 mg p.o. daily  Essential hypertension Continue hydralazine 50 mg p.o. 3 times daily and valsartan 3 and 20 mg p.o. daily.  Anxiety Started on clonazepam 0.5 mg p.o. twice daily as needed    Discharge Diagnoses:  Principal Problem:   Symptomatic anemia Active Problems:   Multiple myeloma (HCC)   CKD (chronic kidney disease) stage 4, GFR 15-29 ml/min [facility])    Discharge Instructions  Discharge Instructions     Call MD for:  difficulty breathing, headache or visual disturbances   Complete by: As directed    Call MD for:  extreme fatigue   Complete by: As directed    Call MD for:  persistant dizziness or light-headedness   Complete by: As directed    Call MD for:  persistant nausea and vomiting   Complete by: As directed    Call MD for:  severe uncontrolled pain   Complete by: As directed    Call MD for:  temperature >100.4   Complete by: As directed    Diet - low sodium heart healthy   Complete by: As directed    Increase activity slowly   Complete by: As directed    No wound care   Complete by: As directed       Allergies as of 02/02/2024       Reactions   Mucinex [guaifenesin Er] Anaphylaxis   Bystolic [nebivolol Hcl] Other (See Comments)   Lowered B/P markedly   Calcium-containing Compounds Other (See Comments)   Broke out the mouth inside- tiny bumps   Codeine Nausea And Vomiting   Paxlovid [nirmatrelvir-ritonavir] Diarrhea, Nausea And Vomiting   Advicor [niacin-lovastatin Er] Rash   Amlodipine Rash   Lisinopril-hydrochlorothiazide Rash   Other Rash, Other (See Comments)   Eye drops with preservatives   Ramipril Rash   Sulfonamide Derivatives Rash        Medication List     STOP taking these medications    aspirin EC 81 MG tablet   Bystolic 5 MG tablet Generic drug: nebivolol   furosemide 20 MG tablet Commonly known as: LASIX       TAKE these medications    amoxicillin 500 MG  capsule Commonly known as: AMOXIL Take 2,000 mg by mouth See admin instructions. Take 2,000 mg by mouth one hour prior to dental visits   Biotin 5000 MCG Caps Take 5,000 mcg by mouth daily.   clonazePAM 0.5 MG tablet Commonly known as: KlonoPIN Take 1 tablet (0.5 mg total) by mouth 2 (two) times daily as needed for anxiety.   Crestor 20 MG tablet Generic drug: rosuvastatin TAKE ONE TABLET BY MOUTH EVERY DAY What changed: when to take this   hydrALAZINE 50 MG tablet Commonly known as: APRESOLINE Take 1 tablet (50 mg total) by mouth 3 (three) times daily. What changed:  when to take this additional instructions   levothyroxine 25 MCG tablet Commonly known as: Synthroid TAKE ONE TABLET BY MOUTH EVERY DAY BEFORE BREAKFAST What changed:  how much to take how to take this when to take this additional instructions   ONE TOUCH ULTRA 2 w/Device Kit CHECK BLOOD SUGAR TWICE DAILY.  DX CODE  E11.9   OneTouch Delica Plus Lancet33G Misc USE 1 LANCET TO TEST AS DIRECTED (DISCARD LANCET IN SHARPS CONTAINER IMMEDIATELY AFTER USE)   OneTouch Ultra Test test strip Generic drug: glucose blood Use as instructed   pantoprazole 40 MG tablet Commonly known as: PROTONIX Pantoprazole 40 mg  before breakfast and dinner for 90 days with 3 refills What changed:  how much to take how to take this when to take this additional instructions   PreserVision AREDS 2 Caps Take 1 capsule by mouth daily with lunch.   Rybelsus 7 MG Tabs Generic drug: Semaglutide Take 1 tablet (7 mg total) by mouth daily.   valsartan 320 MG tablet Commonly known as: DIOVAN Take 1 tablet (320 mg total) by mouth daily. What changed: when to take this        Follow-up Information     Donato Schultz, DO. Schedule an appointment as soon as possible for a visit in 1 week(s).   Specialty: Family Medicine Contact information: [address] [place] [address] [phone]          Johney Maine, MD. Go on 02/04/2024.   Specialties: Hematology, Oncology Contact information: [address] [place] [address] [phone]                Allergies  Allergen Reactions   Mucinex [Guaifenesin Er] Anaphylaxis   Bystolic [Nebivolol Hcl] Other (See Comments)    Lowered B/P markedly   Calcium-Containing Compounds Other (See Comments)    Broke out the mouth inside- tiny bumps   Codeine Nausea And Vomiting   Paxlovid [Nirmatrelvir-Ritonavir] Diarrhea and Nausea And Vomiting   Advicor [Niacin-Lovastatin Er] Rash   Amlodipine Rash   Lisinopril-Hydrochlorothiazide Rash   Other Rash and Other (See Comments)    Eye drops with preservatives   Ramipril Rash   Sulfonamide Derivatives Rash    Consultations: Medical oncology   Procedures/Studies: DG Bone Survey Met Result Date: 02/01/2024 CLINICAL DATA:  Myeloma EXAM: METASTATIC BONE SURVEY COMPARISON:  None Available. FINDINGS: No focal lytic lesion or acute bony abnormality. Degenerative changes in the [facility] joints bilaterally. Degenerative disc and facet disease in the cervical spine. Degenerative facet disease in the lower lumbar spine. Lungs are clear.  No effusions.  Heart is normal size. IMPRESSION: No acute bony abnormality or focal lytic lesion. Electronically Signed   By: Charlett Nose M.D.   On: 02/01/2024 12:44   CT BONE MARROW BIOPSY & ASPIRATION Result Date: 02/01/2024 CLINICAL DATA:  Myeloma EXAM: CT GUIDED DEEP ILIAC BONE ASPIRATION AND CORE BIOPSY TECHNIQUE: Patient was placed prone on the CT gantry and limited axial scans through the pelvis were obtained. This exam was performed according to the departmental dose-optimization program which includes automated exposure control, adjustment of the mA and/or kV according to patient size and/or use of iterative reconstruction technique. Appropriate skin entry site was identified. Skin site was marked, prepped with chlorhexidine, draped in usual  sterile fashion, and infiltrated locally with 1% lidocaine. Intravenous Fentanyl and Versed 1.5mg  were administered by RN during a total moderate (conscious) sedation time of 11 minutes; the patient's level of consciousness and physiological / cardiorespiratory status were monitored continuously by radiology RN under my direct supervision. Under CT fluoroscopic guidance an 11-gauge Cook trocar bone needle was advanced into the right iliac bone just lateral to the sacroiliac joint. Once needle tip position was confirmed, core and aspiration samples were obtained, submitted to pathology for approval. Patient tolerated procedure well. COMPLICATIONS: COMPLICATIONS none IMPRESSION: 1. Technically successful CT guided right iliac bone core and aspiration biopsy. Electronically Signed   By: Corlis Leak M.D.   On: 02/01/2024 12:13   VAS US RENAL ARTERY DUPLEX Result Date: 01/11/2024 ABDOMINAL VISCERAL Patient Name:  LORELY BUBB  Date of Exam:   01/08/2024 Medical Rec #: [id number]        Accession #:    [id number] Date of Birth: [birth year]       Patient Gender: F Patient Age:   24 years Exam Location:  [facility] Procedure:      VAS US RENAL ARTERY DUPLEX Referring Phys: [id number] [facility] [place] A CHANDRASEKHAR -------------------------------------------------------------------------------- Indications: Hypertensive urgency. Patient denies any abdominal pain. High Risk Factors: Hypertension, hyperlipidemia, Diabetes, no history of                    smoking. Limitations: Air/bowel gas. Comparison Study: NA Performing Technologist: Tyna Jaksch RVT  Examination Guidelines: A complete evaluation includes B-mode imaging, spectral Doppler, color Doppler, and power Doppler as needed of all accessible portions of each vessel. Bilateral testing is considered an integral part of a complete examination. Limited examinations for reoccurring indications may be performed as noted.  Duplex Findings:  +--------------------+--------+--------+------+---------------+ Mesenteric          PSV cm/sEDV cm/sPlaque   Comments     +--------------------+--------+--------+------+---------------+ Aorta Prox            118                 1.8 cm x 1.8 cm +--------------------+--------+--------+------+---------------+ Aorta Distal          119                                 +--------------------+--------+--------+------+---------------+ Celiac Artery Origin  145      25                         +--------------------+--------+--------+------+---------------+ SMA Origin             85      7                          +--------------------+--------+--------+------+---------------+  +------------------+--------+--------+-------+ Right Renal ArteryPSV cm/sEDV cm/sComment +------------------+--------+--------+-------+ Origin              112      13           +------------------+--------+--------+-------+ Proximal            137      19           +------------------+--------+--------+-------+ Mid                 124      30           +------------------+--------+--------+-------+ Distal               90      19           +------------------+--------+--------+-------+ +-----------------+--------+--------+-------+ Left Renal ArteryPSV cm/sEDV cm/sComment +-----------------+--------+--------+-------+ Origin             107      28           +-----------------+--------+--------+-------+ Proximal           124      19           +-----------------+--------+--------+-------+ Mid                 82      13           +-----------------+--------+--------+-------+ Distal  65      15           +-----------------+--------+--------+-------+ +------------+--------+--------+----+-----------+--------+--------+----+ Right KidneyPSV cm/sEDV cm/sRI  Left KidneyPSV cm/sEDV cm/sRI   +------------+--------+--------+----+-----------+--------+--------+----+  Upper Pole  27      6       0.78Upper Pole 27      6       0.76 +------------+--------+--------+----+-----------+--------+--------+----+ Mid         27      6       0.        31      7       0.78 +------------+--------+--------+----+-----------+--------+--------+----+ Lower Pole  22      5       0.76Lower Pole 26      5       0.80 +------------+--------+--------+----+-----------+--------+--------+----+ Hilar       50      9       0.82Hilar      34      7       0.79 +------------+--------+--------+----+-----------+--------+--------+----+ +------------------+------+------------------+------+ Right Kidney            Left Kidney              +------------------+------+------------------+------+ RAR                     RAR                      +------------------+------+------------------+------+ RAR (manual)      1.16  RAR (manual)      1.05   +------------------+------+------------------+------+ Cortex            .74 cmCortex            .72 cm +------------------+------+------------------+------+ Cortex thickness        Corex thickness          +------------------+------+------------------+------+ Kidney length (cm)9.32  Kidney length (cm)9.42   +------------------+------+------------------+------+  Summary: Largest Aortic Diameter: 1.8 cm  Renal:  Right: Normal size right kidney. Abnormal right Resistive Index.        Normal cortical thickness of right kidney. No evidence of        right renal artery stenosis. RRV flow present. Extrarenal        pelvis. Left:  Normal size of left kidney. Abnormal left Resistive Index.        Normal cortical thickness of the left kidney. No evidence of        left renal artery stenosis. LRV flow present. Extrarenal        pelvis. Mesenteric: Normal Celiac artery and Superior Mesenteric artery findings.  Patent IVC.  *See table(s) above for measurements and observations.  Diagnosing physician: Lorine Bears MD   Electronically signed by Lorine Bears MD on 01/11/2024 at 7:46:39 AM.    Final      Subjective: Patient seen examined bedside, resting calmly.  Lying in bed.  No specific complaints or concerns at this time.  Husband and daughter present at bedside.  Discussed with medical oncology, Dr. Candise Che; okay for discharge home with outpatient follow-up scheduled on 02/04/2024 to discuss further treatment options.  Family and patient requesting anxiety medication, ordered.  Denies headache, no dizziness, no chest pain, no palpitations, no shortness of breath, no abdominal pain, no fever/chills/night sweats, no nausea/vomiting/diarrhea, no focal weakness, no fatigue, no paresthesias.  No acute events overnight per nursing staff.  Discharge Exam: Vitals:   02/01/24 2101 02/02/24 0603  BP: Marland Kitchen)  158/73 (!) 149/71  Pulse: 70 62  Resp: 18 18  Temp: 97.8 F (36.6 C) 97.9 F (36.6 C)  SpO2: 98% 97%   Vitals:   02/01/24 1442 02/01/24 1825 02/01/24 2101 02/02/24 0603  BP: (!) 140/62 (!) 150/72 (!) 158/73 (!) 149/71  Pulse: 68 73 70 62  Resp: 17 18 18 18   Temp: 98 F (36.7 C) 98.2 F (36.8 C) 97.8 F (36.6 C) 97.9 F (36.6 C)  TempSrc: Oral Oral Oral Oral  SpO2: 99% 99% 98% 97%  Weight:    54.4 kg  Height:        Physical Exam: GEN: NAD, alert and oriented x 3, elderly in appearance HEENT: NCAT, PERRL, EOMI, sclera clear, MMM PULM: CTAB w/o wheezes/crackles, normal respiratory effort, on room air CV: RRR w/o M/G/R GI: abd soft, NTND, NABS, no R/G/M MSK: no peripheral edema, muscle strength globally intact 5/5 bilateral upper/lower extremities NEURO: CN II-XII intact, no focal deficits, sensation to light touch intact PSYCH: Anxious mood, normal affect Integumentary: dry/intact, no rashes or wounds    The results of significant diagnostics from this hospitalization (including imaging, microbiology, ancillary and laboratory) are listed below for reference.     Microbiology: No results found  for this or any previous visit (from the past 240 hours).   Labs: BNP (last 3 results) No results for input(s): "BNP" in the last 8760 hours. Basic Metabolic Panel: Recent Labs  Lab 01/29/24 1830 01/29/24 1841 01/29/24 2030 01/30/24 0555 01/31/24 0743 02/01/24 0557 02/02/24 0641  NA 129* 136  --  128* 129* 128* 127*  K 4.5 4.6  --  4.5 4.1 4.0 4.0  CL 104 106  --  108 109 107 105  CO2 18*  --   --  17* 16* 17* 19*  GLUCOSE 106* 108*  --  142* 132* 142* 148*  BUN 28* 28*  --  28* 23 30* 33*  CREATININE 1.80* 2.10*  --  1.86* 1.50* 1.58* 1.74*  CALCIUM 9.1  --   --  8.6* 8.1* 8.6* 8.1*  MG  --   --  2.0  --   --   --   --   PHOS  --   --  5.1*  --   --   --   --    Liver Function Tests: Recent Labs  Lab 01/29/24 1830 01/30/24 0555 01/31/24 0743  AST 30 22 19   ALT 40 30 24  ALKPHOS 26* 22* 23*  BILITOT 0.6 0.6 0.3  PROT >12.0* 11.0* 10.5*  ALBUMIN 2.9* 2.5* 2.5*   No results for input(s): "LIPASE", "AMYLASE" in the last 168 hours. No results for input(s): "AMMONIA" in the last 168 hours. CBC: Recent Labs  Lab 01/29/24 1830 01/29/24 1841 01/30/24 0555 01/31/24 0743 02/01/24 0557 02/02/24 0641  WBC 8.4  --  10.9* 15.0* 12.8* 13.3*  NEUTROABS 6.0  --   --  11.9* 10.2*  --   HGB 7.5* 7.8* 8.0* 8.0* 8.0* 8.5*  HCT 23.1* 23.0* 24.1* 24.5* 24.8* 25.6*  MCV 97.9  --  95.3 96.1 96.1 97.3  PLT 155  --  139* 140* 142* 162   Cardiac Enzymes: No results for input(s): "CKTOTAL", "CKMB", "CKMBINDEX", "TROPONINI" in the last 168 hours. BNP: Invalid input(s): "POCBNP" CBG: Recent Labs  Lab 02/01/24 1209 02/01/24 1656 02/01/24 1851 02/01/24 2104 02/02/24 0731  GLUCAP 86 147* 126* 85 128*   D-Dimer No results for input(s): "DDIMER" in the last 72 hours. Hgb A1c No results  for input(s): "HGBA1C" in the last 72 hours. Lipid Profile No results for input(s): "CHOL", "HDL", "LDLCALC", "TRIG", "CHOLHDL", "LDLDIRECT" in the last 72 hours. Thyroid function studies No  results for input(s): "TSH", "T4TOTAL", "T3FREE", "THYROIDAB" in the last 72 hours.  Invalid input(s): "FREET3" Anemia work up No results for input(s): "VITAMINB12", "FOLATE", "FERRITIN", "TIBC", "IRON", "RETICCTPCT" in the last 72 hours. Urinalysis    Component Value Date/Time   COLORURINE YELLOW 08/18/2019 1020   APPEARANCEUR Sl Cloudy (A) 08/18/2019 1020   LABSPEC 1.025 08/18/2019 1020   PHURINE 5.5 08/18/2019 1020   GLUCOSEU NEGATIVE 08/18/2019 1020   HGBUR SMALL (A) 08/18/2019 1020   BILIRUBINUR NEGATIVE 08/18/2019 1020   BILIRUBINUR 1+ 09/25/2016 1119   KETONESUR NEGATIVE 08/18/2019 1020   PROTEINUR 1+ 09/25/2016 1119   UROBILINOGEN 0.2 08/18/2019 1020   NITRITE NEGATIVE 08/18/2019 1020   LEUKOCYTESUR SMALL (A) 08/18/2019 1020   Sepsis Labs Recent Labs  Lab 01/30/24 0555 01/31/24 0743 02/01/24 0557 02/02/24 0641  WBC 10.9* 15.0* 12.8* 13.3*   Microbiology No results found for this or any previous visit (from the past 240 hours).   Time coordinating discharge: Over 30 minutes  SIGNED:   Alvira Philips [place], DO  [facility] 02/02/2024, 10:39 AM

## 2024-02-03 LAB — PROTEIN ELECTRO, RANDOM URINE
Albumin ELP, Urine: 10.9 %
Alpha-1-Globulin, U: 1.8 %
Alpha-2-Globulin, U: 5.1 %
Beta Globulin, U: 7.7 %
Gamma Globulin, U: 74.5 %
M Component, Ur: 53.7 % — ABNORMAL HIGH
Total Protein, Urine: 20 mg/dL

## 2024-02-03 LAB — SURGICAL PATHOLOGY

## 2024-02-03 NOTE — Progress Notes (Signed)
 HEMATOLOGY/ONCOLOGY CONSULTATION NOTE  Date of Service: 02/04/2024  Patient Care Team: Zola Button, Grayling Congress, DO as PCP - General Wendall Stade, MD as PCP - Cardiology (Cardiology) Mckinley Jewel, MD as Consulting Physician (Ophthalmology) Hart Carwin, MD (Inactive) (Gastroenterology) Kathryne Hitch, MD as Consulting Physician (Orthopedic Surgery) Cherlyn Roberts, MD as Referring Physician (Dermatology) Genia Del, MD as Consulting Physician (Obstetrics and Gynecology)  CHIEF COMPLAINTS/PURPOSE OF CONSULTATION:  Evaluation and management of newly diagnosed Multiple myeloma   HISTORY OF PRESENTING ILLNESS:   Cynthia Burns is a wonderful 78 y.o. female who was scheduled to see Dr. Kalman Drape as a new patient and was referred to the ED for evaluation and management of newly diagnosed myeloma with rapidly worsening hgb and renal insuffiencey with proteinuria. High suspicious of Multiple myeloma.    Outside labs done at Dr Willette Pa Singh's office (available in referral under media) show M spike of 6.1g/dl and Kappa light chains in the 400's.   Patient is accompanied by her husband and her daughter at bed side during the visit. Patient notes she has been having significantly more fatigue. Has required PRBC transfusions for symptomatic anemia.   She complains of fatigue, right ankle pain, and unexpected weight loss of around 25-30 lbs due to appetite loss in the past year. Her daughter notes that the patient has been confused more often as well.    She denies any new infection issues, fever, chills, back pain, chest pain, abdominal pain, or leg swelling.    Patient notes she tested positive for COVID-19 infection in August 2022 and was prescribed Paxlovid. However, she did not tolerate the medication due to allergic reaction.   PmHx of hypertension. She has been taking Losartan and hydralazine.   Surgery history: Hysterectomy and Cholecystectomy.    She was  started on dexamethasone 20 mg daily x 4 doses yesterday. She has been tolerating the high-dose steroid well.   INTERVAL HISTORY:  Cynthia Burns is a 78 y.o. female here for evaluation and management of newly diagnosed myeloma with rapidly worsening hgb and renal insuffiencey with proteinuria.   Patient was seen by me in the ED on 01/30/2024 and complained of significantly worsened fatigue as well as right ankle pain, increased confusion, unexpected weight loss of about 25-30 pounds in 1 year due to appetite loss.  Today, she is accompanied by her husband and daughter. Her husband reports that patient has been feeling extremely weak since being discharged from the hospital. Patient reports no other new symptoms since her inpatient visit.   Patient's husband reports that patient was started on 0.5 MG Clonazepam by Dr. [place] in the hospital for anxiety management. Her husband reports that Clonazepam caused sedation, so it has been cut in half.   Patient's husband reports that her blood pressure generally fluctuates at home. Her blood pressure in clinic today is 161/74. She notes that she takes her blood pressure medication at lunch time. She reports one incident in which her blood pressure was over 200, though it has not been persistent at that level.  Patient reports that her baseline weight one year ago was 136 pounds. She did lose about 32 pounds recently, but she has gained back 12 pounds. Her current weight is 116 pounds.   She has not received the latest COVID-19 vaccine. Patient is otherwise UTD with her age-appropriate vaccines, including flu, RSV, shingles, and pneumonia.   She notes that she was infected with COVID-19 3x previously.   Patient  does endorse some lightheadedness. She denies any back pain or abdominal pain.   Pt reports a hx of prolapsed bladder, which is not sympomatically bothersome.   Patient reports allergies to several medications.   She does stay regularly  hydrated by consuming nearly 3L of water daily.   Patient will have a kidney biopsy on 02/18/2024.  She will see her kidney doctor next on 02/24/2024.   Patient reports that her PCP has started her on Rybelsus for DM management. She reports that her next visit with her PCP is 02/25/2024.   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   HSV-1 infection    HYPERTENSION    Hypothyroid    MITRAL VALVE PROLAPSE    OSTEOPENIA    Overweight(278.02)    PHARYNGITIS, ACUTE     SURGICAL HISTORY: Past Surgical History:  Procedure Laterality Date   ABDOMINAL HYSTERECTOMY     CHOLECYSTECTOMY     prolped bladder     WISDOM TOOTH EXTRACTION      SOCIAL HISTORY: Social History   Socioeconomic History   Marital status: Married    Spouse name: Not on file   Number of children: Not on file   Years of education: Not on file   Highest education level: Associate degree: occupational, Scientist, product/process development, or vocational program  Occupational History   Occupation: hairdresser  Tobacco Use   Smoking status: Never   Smokeless tobacco: Never  Vaping Use   Vaping status: Never Used  Substance and Sexual Activity   Alcohol use: No    Alcohol/week: 0.0 standard drinks of alcohol   Drug use: No   Sexual activity: Not Currently    Partners: Male    Birth control/protection: Surgical, Abstinence    Comment: hysterectomy, 17, less than 5  Other Topics Concern   Not on file  Social History Narrative   Not on file   Social Drivers of Health   Financial Resource Strain: Low Risk  (12/07/2023)   Overall Financial Resource Strain (CARDIA)    Difficulty of Paying Living Expenses: Not hard at all  Food Insecurity: Patient Declined  (01/30/2024)   Hunger Vital Sign    Worried About Running Out of Food in the Last Year: Patient declined    Ran Out of Food in the Last Year: Patient declined  Transportation Needs: No Transportation Needs (01/30/2024)   PRAPARE - Administrator, Civil Service (Medical): No    Lack of Transportation (Non-Medical): No  Physical Activity: Insufficiently Active (12/07/2023)   Exercise Vital Sign    Days of Exercise per Week: 1 day    Minutes of Exercise per Session: 10 min  Stress: No Stress Concern Present (12/07/2023)   [employer] of Occupational Health - Occupational Stress Questionnaire    Feeling of Stress : Only a little  Social Connections: Socially Integrated (01/30/2024)   Social Connection and Isolation Panel [NHANES]    Frequency of Communication with Friends and Family: More than three times a week    Frequency of Social Gatherings with Friends and Family: More than three times a week    Attends Religious Services: More than  4 times per year    Active Member of Clubs or Organizations: Yes    Attends Banker Meetings: More than 4 times per year    Marital Status: Married  Catering manager Violence: Not At Risk (01/30/2024)   Humiliation, Afraid, Rape, and Kick questionnaire    Fear of Current or Ex-Partner: No    Emotionally Abused: No    Physically Abused: No    Sexually Abused: No    FAMILY HISTORY: Family History  Problem Relation Age of Onset   Hypertension Mother    Heart disease Mother        Had a stent   Emphysema Father    Diabetes Father    COPD Father    Stroke Sister    Breast cancer Neg Hx     ALLERGIES:  is allergic to mucinex [guaifenesin er], bystolic [nebivolol hcl], calcium-containing compounds, codeine, paxlovid [nirmatrelvir-ritonavir], advicor [niacin-lovastatin er], amlodipine, lisinopril-hydrochlorothiazide, other, ramipril, and sulfonamide derivatives.  MEDICATIONS:  Current Outpatient Medications  Medication Sig  Dispense Refill   amoxicillin (AMOXIL) 500 MG capsule Take 2,000 mg by mouth See admin instructions. Take 2,000 mg by mouth one hour prior to dental visits     Biotin 5000 MCG CAPS Take 5,000 mcg by mouth daily.     Blood Glucose Monitoring Suppl (ONE TOUCH ULTRA 2) w/Device KIT CHECK BLOOD SUGAR TWICE DAILY.  DX CODE  E11.9 1 kit 0   clonazePAM (KLONOPIN) 0.5 MG tablet Take 1 tablet (0.5 mg total) by mouth 2 (two) times daily as needed for anxiety. 30 tablet 0   glucose blood (ONETOUCH ULTRA TEST) test strip Use as instructed 200 each 3   hydrALAZINE (APRESOLINE) 50 MG tablet Take 1 tablet (50 mg total) by mouth 3 (three) times daily. (Patient taking differently: Take 50 mg by mouth See admin instructions. Take 50 mg by mouth with lunch and supper) 270 tablet 3   Lancets (ONETOUCH DELICA PLUS LANCET33G) MISC USE 1 LANCET TO TEST AS DIRECTED (DISCARD LANCET IN SHARPS CONTAINER IMMEDIATELY AFTER USE) 200 each 1   levothyroxine (SYNTHROID) 25 MCG tablet TAKE ONE TABLET BY MOUTH EVERY DAY BEFORE BREAKFAST (Patient taking differently: Take 25 mcg by mouth daily before breakfast.) 90 tablet 1   Multiple Vitamins-Minerals (PRESERVISION AREDS 2) CAPS Take 1 capsule by mouth daily with lunch.     pantoprazole (PROTONIX) 40 MG tablet Pantoprazole 40 mg before breakfast and dinner for 90 days with 3 refills (Patient taking differently: Take 40 mg by mouth See admin instructions. Take 40 mg by mouth 30 minutes before lunch and supper) 180 tablet 3   rosuvastatin (CRESTOR) 20 MG tablet TAKE ONE TABLET BY MOUTH EVERY DAY (Patient taking differently: Take 20 mg by mouth daily with supper.) 90 tablet 1   Semaglutide (RYBELSUS) 7 MG TABS Take 1 tablet (7 mg total) by mouth daily. (Patient not taking: Reported on 01/29/2024) 90 tablet 1   valsartan (DIOVAN) 320 MG tablet Take 1 tablet (320 mg total) by mouth daily. 90 tablet 0   No current facility-administered medications for this visit.    REVIEW OF SYSTEMS:     10 Point review of Systems was done is negative except as noted above.  PHYSICAL EXAMINATION: ECOG PERFORMANCE STATUS: 2 - Symptomatic, <50% confined to bed  . Vitals:   02/04/24 1116  BP: (!) 161/74  Pulse: 80  Resp: 16  Temp: (!) 97.5 F (36.4 C)  SpO2: 100%   Filed Weights   02/04/24 1116  Weight: 116 lb 6.4 oz (52.8 kg)   .Body mass index is 21.99 kg/m.  GENERAL:alert, in no acute distress and comfortable SKIN: no acute rashes, no significant lesions EYES: conjunctiva are pink and non-injected, sclera anicteric OROPHARYNX: MMM, no exudates, no oropharyngeal erythema or ulceration NECK: supple, no JVD LYMPH:  no palpable lymphadenopathy in the cervical, axillary or inguinal regions LUNGS: clear to auscultation b/l with normal respiratory effort HEART: regular rate & rhythm ABDOMEN:  normoactive bowel sounds , non tender, not distended. Extremity: no pedal edema PSYCH: alert & oriented x 3 with fluent speech NEURO: no focal motor/sensory deficits  LABORATORY DATA:  I have reviewed the data as listed  .    Latest Ref Rng & Units 02/04/2024   12:22 PM 02/02/2024    6:41 AM 02/01/2024    5:57 AM  CBC  WBC 4.0 - 10.5 K/uL 10.6  13.3  12.8   Hemoglobin 12.0 - 15.0 g/dL 9.3  8.5  8.0   Hematocrit 36.0 - 46.0 % 28.1  25.6  24.8   Platelets 150 - 400 K/uL 179  162  142     .    Latest Ref Rng & Units 02/04/2024   12:22 PM 02/02/2024    6:41 AM 02/01/2024    5:57 AM  CMP  Glucose 70 - 99 mg/dL 89  846  962   BUN 8 - 23 mg/dL 31  33  30   Creatinine 0.44 - 1.00 mg/dL 9.52  8.41  3.24   Sodium 135 - 145 mmol/L 132  127  128   Potassium 3.5 - 5.1 mmol/L 3.6  4.0  4.0   Chloride 98 - 111 mmol/L 106  105  107   CO2 22 - 32 mmol/L 26  19  17    Calcium 8.9 - 10.3 mg/dL 8.4  8.1  8.6   Total Protein 6.5 - 8.1 g/dL 40.1     Total Bilirubin 0.0 - 1.2 mg/dL 0.3     Alkaline Phos 38 - 126 U/L 23     AST 15 - 41 U/L 30     ALT 0 - 44 U/L 49      Bone marrow biopsy  02/01/2024:    Surgical pathology 01/29/2024:    RADIOGRAPHIC STUDIES: I have personally reviewed the radiological images as listed and agreed with the findings in the report. DG Bone Survey Met Result Date: 02/01/2024 CLINICAL DATA:  Myeloma EXAM: METASTATIC BONE SURVEY COMPARISON:  None Available. FINDINGS: No focal lytic lesion or acute bony abnormality. Degenerative changes in the [facility] joints bilaterally. Degenerative disc and facet disease in the cervical spine. Degenerative facet disease in the lower lumbar spine. Lungs are clear.  No effusions.  Heart is normal size. IMPRESSION: No acute bony abnormality or focal lytic lesion. Electronically Signed   By: Charlett Nose M.D.   On: 02/01/2024 12:44   CT BONE MARROW BIOPSY & ASPIRATION Result Date: 02/01/2024 CLINICAL DATA:  Myeloma EXAM: CT GUIDED DEEP ILIAC BONE ASPIRATION AND CORE BIOPSY TECHNIQUE: Patient was placed prone on the CT gantry and limited axial scans through the pelvis were obtained. This exam was performed according to the departmental dose-optimization program which includes automated exposure control, adjustment of the mA and/or kV according to patient size and/or use of iterative reconstruction technique. Appropriate skin entry site was identified. Skin site was marked, prepped with chlorhexidine, draped in usual sterile fashion, and infiltrated locally with 1% lidocaine. Intravenous Fentanyl and Versed 1.5mg  were administered  by RN during a total moderate (conscious) sedation time of 11 minutes; the patient's level of consciousness and physiological / cardiorespiratory status were monitored continuously by radiology RN under my direct supervision. Under CT fluoroscopic guidance an 11-gauge Cook trocar bone needle was advanced into the right iliac bone just lateral to the sacroiliac joint. Once needle tip position was confirmed, core and aspiration samples were obtained, submitted to pathology for approval. Patient tolerated  procedure well. COMPLICATIONS: COMPLICATIONS none IMPRESSION: 1. Technically successful CT guided right iliac bone core and aspiration biopsy. Electronically Signed   By: Corlis Leak M.D.   On: 02/01/2024 12:13   VAS US RENAL ARTERY DUPLEX Result Date: 01/11/2024 ABDOMINAL VISCERAL Patient Name:  MEIKO IVES  Date of Exam:   01/08/2024 Medical Rec #: [id number]        Accession #:    [id number] Date of Birth: 1946-07-16       Patient Gender: F Patient Age:   34 years Exam Location:  [facility] Procedure:      VAS US RENAL ARTERY DUPLEX Referring Phys: [id number] [facility] A CHANDRASEKHAR -------------------------------------------------------------------------------- Indications: Hypertensive urgency. Patient denies any abdominal pain. High Risk Factors: Hypertension, hyperlipidemia, Diabetes, no history of                    smoking. Limitations: Air/bowel gas. Comparison Study: NA Performing Technologist: Tyna Jaksch RVT  Examination Guidelines: A complete evaluation includes B-mode imaging, spectral Doppler, color Doppler, and power Doppler as needed of all accessible portions of each vessel. Bilateral testing is considered an integral part of a complete examination. Limited examinations for reoccurring indications may be performed as noted.  Duplex Findings: +--------------------+--------+--------+------+---------------+ Mesenteric          PSV cm/sEDV cm/sPlaque   Comments     +--------------------+--------+--------+------+---------------+ Aorta Prox            118                 1.8 cm x 1.8 cm +--------------------+--------+--------+------+---------------+ Aorta Distal          119                                 +--------------------+--------+--------+------+---------------+ Celiac Artery Origin  145      25                         +--------------------+--------+--------+------+---------------+ SMA Origin             85      7                           +--------------------+--------+--------+------+---------------+  +------------------+--------+--------+-------+ Right Renal ArteryPSV cm/sEDV cm/sComment +------------------+--------+--------+-------+ Origin              112      13           +------------------+--------+--------+-------+ Proximal            137      19           +------------------+--------+--------+-------+ Mid                 124      30           +------------------+--------+--------+-------+ Distal               90  19           +------------------+--------+--------+-------+ +-----------------+--------+--------+-------+ Left Renal ArteryPSV cm/sEDV cm/sComment +-----------------+--------+--------+-------+ Origin             107      28           +-----------------+--------+--------+-------+ Proximal           124      19           +-----------------+--------+--------+-------+ Mid                 82      13           +-----------------+--------+--------+-------+ Distal              65      15           +-----------------+--------+--------+-------+ +------------+--------+--------+----+-----------+--------+--------+----+ Right KidneyPSV cm/sEDV cm/sRI  Left KidneyPSV cm/sEDV cm/sRI   +------------+--------+--------+----+-----------+--------+--------+----+ Upper Pole  27      6       0.78Upper Pole 27      6       0.76 +------------+--------+--------+----+-----------+--------+--------+----+ Mid         27      6       0.        31      7       0.78 +------------+--------+--------+----+-----------+--------+--------+----+ Lower Pole  22      5       0.76Lower Pole 26      5       0.80 +------------+--------+--------+----+-----------+--------+--------+----+ Hilar       50      9       0.82Hilar      34      7       0.79 +------------+--------+--------+----+-----------+--------+--------+----+ +------------------+------+------------------+------+  Right Kidney            Left Kidney              +------------------+------+------------------+------+ RAR                     RAR                      +------------------+------+------------------+------+ RAR (manual)      1.16  RAR (manual)      1.05   +------------------+------+------------------+------+ Cortex            .74 cmCortex            .72 cm +------------------+------+------------------+------+ Cortex thickness        Corex thickness          +------------------+------+------------------+------+ Kidney length (cm)9.32  Kidney length (cm)9.42   +------------------+------+------------------+------+  Summary: Largest Aortic Diameter: 1.8 cm  Renal:  Right: Normal size right kidney. Abnormal right Resistive Index.        Normal cortical thickness of right kidney. No evidence of        right renal artery stenosis. RRV flow present. Extrarenal        pelvis. Left:  Normal size of left kidney. Abnormal left Resistive Index.        Normal cortical thickness of the left kidney. No evidence of        left renal artery stenosis. LRV flow present. Extrarenal        pelvis. Mesenteric: Normal Celiac artery and Superior Mesenteric artery findings.  Patent IVC.  *See table(s) above for measurements and observations.  Diagnosing physician: Lorine Bears MD  Electronically signed by Lorine Bears MD on 01/11/2024 at 7:46:39 AM.    Final     ASSESSMENT & PLAN:  78 y.o. female with:  Newly diagnosed Multiple myeloma - Lambda restricted plasma cell neoplasm comprising greater than 75% of  the cellular marrow 2. Acute on CKD 3. HTN 4. DM2 5. Hypothyroidism  Patient Active Problem List   Diagnosis Date Noted   Symptomatic anemia 01/29/2024   Multiple myeloma (HCC) 01/29/2024   CKD (chronic kidney disease) stage 4, GFR 15-29 ml/min (HCC) 01/29/2024   Anemia 11/26/2023   Hypothyroidism 02/23/2023   Type 2 diabetes mellitus with hyperglycemia, without long-term current use of  insulin (HCC) 07/26/2021   Impacted cerumen of right ear 07/06/2020   Type 2 diabetes mellitus with hyperglycemia (HCC) 04/11/2019   Hyperlipidemia LDL goal <70 03/23/2017   Abdominal pain, epigastric 09/03/2016   Strain of right biceps 03/17/2016   Right shoulder pain 03/17/2016   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   Plantar wart of right foot 03/15/2015   Uncontrolled type 2 diabetes mellitus with hyperglycemia (HCC) 09/16/2013   Elevated lipids 09/05/2013   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   OVERWEIGHT 02/07/2009   PHARYNGITIS, ACUTE 06/03/2007   Essential hypertension 05/18/2007   MITRAL VALVE PROLAPSE 05/18/2007   OSTEOPENIA 05/18/2007    PLAN:  -CBC from 02/02/2024 showed WBC of 13.3K, hemoglobin of 8.5, and platelets of 162K.  -there is a role for blood transfusions as needed for anemia -genetics from bone marrow biopsy is pending at this time -educated patient and her family members that genetic testing will provide a general sense of how her condition might behave, though it may not necessary behave in such way. Also discussed that the treatment profile is constantly improving over time -myeloma lab on 01/29/2024 confirms presence of 6.5g of igG lambda protein  -bone marrow biopsy does show Lambda restricted plasma cell neoplasm comprising greater than 75% of  the cellular marrow consistent with active plasma cell myeloma  -educated patient on criteria for differentiating between smoldering vs active myeloma -patient does meet criteria for active myeloma based on CRAB criteria; Patient does have kidney changes and anemia requiring blood transfusions. Her recent whole body x-ray showed no obvious bone tumors, and PET scan is ordered for further evaluation. Patient has no elevated calcium.  -discussed that myeloma is not curable but can be treatable to the point of remission. Discussed that myeloma does tend to recur and typically requires ongoing treatment to control the condition.   -educated patient on details of staging of disease -discussed that there are different phases of treatment, beginning with the induction phase of treatment, which involves a combination of treatment for 4-6 months with the goal of achieving very good partial response of 90% . Discussed goal of decreasing M protein from 6.5 g/dL to <1.6 g/dL with induction treatment.  -discussed that treatment for multiple myeloma would involve a combination of 3-4 targeted therapies as part of DaraCyBorD regimen, which would be chosen based on their effectiveness and other medical issues that may be limiting.  -Discussed that based on her changes in kidney function and her degree of anemia, we will start with a 3-medication treatment regimen -educated patient on details of daratumumab, Cytoxan, Velcade, and Dexamethasone -wil hold off cytozan until after the first cycle of treatment to ensure that her blood counts are stable.  -discussed that after induction phase of treatment, there will be two options to consider:  Autologous bone marrow transplant - discussed that this aggressive  treatment option would involve high-dose chemotherapy. Discussed that this option may be an option if patient does not have significant medical limitations and generally would be an option for patients under 75, though there are exceptions. This option would not be curative, though it may help to extend the duration of remission for longer. Autologous bone marrow transplant would eventually be followed by maintenace treatment.  Proceeding with maintenance treatment directly  -discussed that her treatment does not require port placement since it is not primarily administered via IV, but if needed for convenience, a port may be considered down the line -discussed that there multiple possible reasons for DNA damage, including chemical exposures and increased age -there is no role for radiation therapy at this time -will set up patient for  educational counseling session to discuss details of treatment -educated patient and her family members that fluctuations in blood pressure could be due to several possible factors, including anxiety, anemia, fluid status changes, and steroid use.  -advised patient to check her blood pressure first thing in the morning to determine her baseline blood pressure  -recommend patient to stay hydrated with 3L of liquids daily, which are non-caffeinated, non-carbonated, and non-alcoholic -if her anemia is not limiting and once patient's lightheadedness resolves, I would recommend walking at least 30 minutes a day -recommend regular daily activities to build muscle strength -continue to eat and drink well  -discussed option of using a low-sugar nutritional supplement to optimize her nutrition while not affecting her blood sugar levels.  -Recommend taking vitamin B12 and D3 supplementation -would not recommend oral iron supplements at this time as it may cause constipation issues. There may be a role for IV iron if needed.  -advised patient to precautionary measures including wearing facial masks while  -will order additional blood tests today for further evaluation -will plan for PET scan -advised patient to connect with her PCP sooner than her currently scheduled appointment of 02/25/2024 to have plan to adjust her DM medications if needed given that she will receive high-dose steroids 1-2x a week as part of her treatment -discussed that is her prolapsed bladder is minor and not significantly bothersome, surgical intervention would not be recommended at this time.  -advised patient to take reasonable precautions including wearing face masks and avoiding large crowds -Patient will have a kidney biopsy on 02/18/2024 -advised patient to connect with her kidney doctor to discuss whether there is a role for an upcoming clinical visit on 02/24/2024 based on kidney biopsy results.  -will connect patient with  financial resources to discuss financial/insurance information -answered all of patient's and her family member's questions in detail  FOLLOW-UP: Labs today Chemo-counseling for DaraCyBorD in 3-4 days PEt/CT in 1 week Plz schedule to start DaraCyBorD in 1 week (do not bee to wait on PET)  The total time spent in the appointment was 65 minutes* .  All of the patient's questions were answered with apparent satisfaction. The patient knows to call the clinic with any problems, questions or concerns.   Wyvonnia Lora MD MS AAHIVMS [facility] Hematology/Oncology Physician [facility]  .*Total Encounter Time as defined by the Centers for Medicare and Medicaid Services includes, in addition to the face-to-face time of a patient visit (documented in the note above) non-face-to-face time: obtaining and reviewing outside history, ordering and reviewing medications, tests or procedures, care coordination (communications with other health care professionals or caregivers) and documentation in the medical record.    I,Mitra Faeizi,acting as a Neurosurgeon  for Wyvonnia Lora, MD.,have documented all relevant documentation on the behalf of Wyvonnia Lora, MD,as directed by  Wyvonnia Lora, MD while in the presence of Wyvonnia Lora, MD.  .I have reviewed the above documentation for accuracy and completeness, and I agree with the above. Johney Maine MD

## 2024-02-04 ENCOUNTER — Other Ambulatory Visit: Payer: Self-pay

## 2024-02-04 ENCOUNTER — Telehealth: Payer: Self-pay

## 2024-02-04 ENCOUNTER — Inpatient Hospital Stay: Payer: Medicare Other | Attending: Hematology | Admitting: Hematology

## 2024-02-04 ENCOUNTER — Inpatient Hospital Stay: Payer: Medicare Other

## 2024-02-04 VITALS — BP 161/74 | HR 80 | Temp 97.5°F | Resp 16 | Wt 116.4 lb

## 2024-02-04 DIAGNOSIS — Z5111 Encounter for antineoplastic chemotherapy: Secondary | ICD-10-CM

## 2024-02-04 DIAGNOSIS — E119 Type 2 diabetes mellitus without complications: Secondary | ICD-10-CM | POA: Insufficient documentation

## 2024-02-04 DIAGNOSIS — Z8616 Personal history of COVID-19: Secondary | ICD-10-CM | POA: Insufficient documentation

## 2024-02-04 DIAGNOSIS — C9000 Multiple myeloma not having achieved remission: Secondary | ICD-10-CM

## 2024-02-04 DIAGNOSIS — I10 Essential (primary) hypertension: Secondary | ICD-10-CM | POA: Diagnosis not present

## 2024-02-04 DIAGNOSIS — N179 Acute kidney failure, unspecified: Secondary | ICD-10-CM | POA: Diagnosis not present

## 2024-02-04 DIAGNOSIS — E039 Hypothyroidism, unspecified: Secondary | ICD-10-CM | POA: Insufficient documentation

## 2024-02-04 LAB — CBC WITH DIFFERENTIAL (CANCER CENTER ONLY)
Abs Immature Granulocytes: 0.25 10*3/uL — ABNORMAL HIGH (ref 0.00–0.07)
Basophils Absolute: 0 10*3/uL (ref 0.0–0.1)
Basophils Relative: 0 %
Eosinophils Absolute: 0.1 10*3/uL (ref 0.0–0.5)
Eosinophils Relative: 1 %
HCT: 28.1 % — ABNORMAL LOW (ref 36.0–46.0)
Hemoglobin: 9.3 g/dL — ABNORMAL LOW (ref 12.0–15.0)
Immature Granulocytes: 2 %
Lymphocytes Relative: 28 %
Lymphs Abs: 3 10*3/uL (ref 0.7–4.0)
MCH: 31.8 pg (ref 26.0–34.0)
MCHC: 33.1 g/dL (ref 30.0–36.0)
MCV: 96.2 fL (ref 80.0–100.0)
Monocytes Absolute: 1 10*3/uL (ref 0.1–1.0)
Monocytes Relative: 10 %
Neutro Abs: 6.2 10*3/uL (ref 1.7–7.7)
Neutrophils Relative %: 59 %
Platelet Count: 179 10*3/uL (ref 150–400)
RBC: 2.92 MIL/uL — ABNORMAL LOW (ref 3.87–5.11)
RDW: 16.7 % — ABNORMAL HIGH (ref 11.5–15.5)
WBC Count: 10.6 10*3/uL — ABNORMAL HIGH (ref 4.0–10.5)
nRBC: 0.3 % — ABNORMAL HIGH (ref 0.0–0.2)

## 2024-02-04 LAB — CMP (CANCER CENTER ONLY)
ALT: 49 U/L — ABNORMAL HIGH (ref 0–44)
AST: 30 U/L (ref 15–41)
Albumin: 3 g/dL — ABNORMAL LOW (ref 3.5–5.0)
Alkaline Phosphatase: 23 U/L — ABNORMAL LOW (ref 38–126)
Anion gap: 0 — ABNORMAL LOW (ref 5–15)
BUN: 31 mg/dL — ABNORMAL HIGH (ref 8–23)
CO2: 26 mmol/L (ref 22–32)
Calcium: 8.4 mg/dL — ABNORMAL LOW (ref 8.9–10.3)
Chloride: 106 mmol/L (ref 98–111)
Creatinine: 1.61 mg/dL — ABNORMAL HIGH (ref 0.44–1.00)
GFR, Estimated: 33 mL/min — ABNORMAL LOW (ref >60)
Glucose, Bld: 89 mg/dL (ref 70–99)
Potassium: 3.6 mmol/L (ref 3.5–5.1)
Sodium: 132 mmol/L — ABNORMAL LOW (ref 135–145)
Total Bilirubin: 0.3 mg/dL (ref 0.0–1.2)
Total Protein: 11.2 g/dL — ABNORMAL HIGH (ref 6.5–8.1)

## 2024-02-04 LAB — IRON AND IRON BINDING CAPACITY (CC-WL,HP ONLY)
Iron: 55 ug/dL (ref 28–170)
Saturation Ratios: 22 % (ref 10.4–31.8)
TIBC: 245 ug/dL — ABNORMAL LOW (ref 250–450)
UIBC: 190 ug/dL (ref 148–442)

## 2024-02-04 LAB — VITAMIN B12: Vitamin B-12: 574 pg/mL (ref 180–914)

## 2024-02-04 LAB — FERRITIN: Ferritin: 146 ng/mL (ref 11–307)

## 2024-02-04 LAB — SAMPLE TO BLOOD BANK

## 2024-02-04 NOTE — Progress Notes (Signed)
START OFF PATHWAY REGIMEN - Multiple Myeloma and Other Plasma Cell Dyscrasias   [id number]:DaraCyBord (Daratumumab SUBQ + Cyclophosphamide IV + Bortezomib SUBQ + Dexamethasone PO/IV) q28 Days Followed by Daratumumab SUBQ q28 Days for up to 2 Years:   Cycles 1 and 2: A cycle is every 28 days:     Dexamethasone      Daratumumab and hyaluronidase-fihj      Cyclophosphamide      Bortezomib    Cycles 3 through 6: A cycle is every 28 days:     Dexamethasone      Dexamethasone      Daratumumab and hyaluronidase-fihj      Cyclophosphamide      Bortezomib    Cycles 7 and beyond (up to 2 years): A cycle is every 28 days:     Daratumumab and hyaluronidase-fihj   **Always confirm dose/schedule in your pharmacy ordering system**  Patient Characteristics: Multiple Myeloma, Newly Diagnosed, Transplant Eligible, Unknown Risk or Awaiting Test Results Disease Classification: Multiple Myeloma Therapeutic Status: Newly Diagnosed R2-ISS Staging: Awaiting Test Results Is Patient Eligible for Transplant<= Transplant Eligible Risk Status: Awaiting Test Results Intent of Therapy: Non-Curative / Palliative Intent, Discussed with Patient

## 2024-02-04 NOTE — Progress Notes (Signed)
Patient seen by Dr Candise Che and scheduled to begin chemo next week. Will touch base with patient and give my number and explain my role. Will continue to monitor for needs.

## 2024-02-04 NOTE — Transitions of Care (Post Inpatient/ED Visit) (Signed)
   02/04/2024  Name: Cynthia Burns MRN: [id number] DOB: 06-30-1946  Today's TOC FU Call Status: Today's TOC FU Call Status:: Unsuccessful Call (1st Attempt) Unsuccessful Call (1st Attempt) Date: 02/04/24  Attempted to reach the patient regarding the most recent Inpatient/ED visit.  Follow Up Plan: Additional outreach attempts will be made to reach the patient to complete the Transitions of Care (Post Inpatient/ED visit) call.   Wyline Mood BSN, Programmer, systems   Transitions of Care  [place] / [facility], [facility] Direct Dial Number: [phone]  Fax: [phone]

## 2024-02-04 NOTE — Progress Notes (Signed)
Patient had appointment with Dr Candise Che and chemotherapy was scheduled. Will start chemotherapy 02/11/24. Will contact patient and explain my role and explain that they can contact me for any needs or questions.

## 2024-02-05 ENCOUNTER — Encounter: Payer: Self-pay | Admitting: Family Medicine

## 2024-02-05 ENCOUNTER — Ambulatory Visit (INDEPENDENT_AMBULATORY_CARE_PROVIDER_SITE_OTHER): Payer: Medicare Other | Admitting: Family Medicine

## 2024-02-05 VITALS — BP 180/100 | HR 90 | Temp 97.8°F | Resp 18 | Ht 61.0 in | Wt 116.2 lb

## 2024-02-05 DIAGNOSIS — C9000 Multiple myeloma not having achieved remission: Secondary | ICD-10-CM | POA: Diagnosis not present

## 2024-02-05 DIAGNOSIS — E1165 Type 2 diabetes mellitus with hyperglycemia: Secondary | ICD-10-CM | POA: Diagnosis not present

## 2024-02-05 DIAGNOSIS — Z794 Long term (current) use of insulin: Secondary | ICD-10-CM

## 2024-02-05 MED ORDER — PEN NEEDLES 31G X 6 MM MISC: 0 refills | Status: DC

## 2024-02-05 MED ORDER — NOVOLOG FLEXPEN 100 UNIT/ML ~~LOC~~ SOPN: PEN_INJECTOR | SUBCUTANEOUS | 11 refills | Status: DC

## 2024-02-05 NOTE — Patient Instructions (Addendum)
Summary: 0-15 Units, Subcutaneous, 3 times daily with meals, First dose on Sat 01/30/24 at 0800 Correction coverage: Moderate (average weight, post-op) CBG < 70: Implement Hypoglycemia Standing Orders and refer to Hypoglycemia Standing Orders sidebar report CBG 70 - 120: 0 units CBG 121 - 150: 2 units CBG 151 - 200: 3 units CBG 201 - 250: 5 units CBG 251 - 300: 8 units CBG 301 - 350: 11 units CBG 351 - 400: 15 units CBG > 400: call MD      Check glucose 3x a day and use sliding scale above

## 2024-02-05 NOTE — Assessment & Plan Note (Addendum)
hgba1c to be checked,  minimize simple carbs. Increase exercise as tolerated. Continue current meds Sliding scale insulin given to pt to use since steroids will be given with each treatment for multiple myeloma

## 2024-02-05 NOTE — Progress Notes (Signed)
Established Patient Office Visit  Subjective   [name], female    DOB: 1946/09/17  Age: 78 y.o. MRN: [id number]  Chief Complaint  Patient presents with   Hospitalization Follow-up    HPI Discussed the use of AI scribe software for clinical note transcription with the patient, who gave verbal consent to proceed.  History of Present Illness   [name] is a 78 year old female with multiple myeloma who presents for management of blood sugar and blood pressure during steroid treatment. She is accompanied by her husband, Gene. She was referred by Dr. Thedore Mins for management of her multiple myeloma and associated conditions.  She has a recent diagnosis of multiple myeloma and is preparing to start steroid treatment next week. She experiences anxiety related to her health situation and uses clonazepam as needed, particularly before medical appointments, cutting the 0.5 mg dose in half to avoid excessive sedation.  She has a history of diabetes and is concerned about her blood sugar levels increasing with steroid use. She has not adjusted her diabetic medications yet and is currently taking Rybelsus before lunch, which maintains her blood sugar around 99 to 100 mg/dL. She is considering insulin management if her blood sugar rises with steroid use.  Her blood pressure has been fluctuating, with significant increases at night, which she attributes to anxiety. She monitors her blood pressure, noting an average of 130/75 mmHg, but it can spike to 180/100 mmHg. She has experienced episodes of hypotension, particularly after fasting, with readings as low as 75/39 mmHg, leading to syncope. She takes hydralazine but has adjusted her dosing schedule to avoid morning hypotension.  She is concerned about maintaining her weight and managing her nutrition during treatment. She has lost significant weight, dropping to 104 pounds, but has since increased to 116 pounds. She is scheduled for  a PET scan on the 20th and a kidney biopsy on the 27th.  She has a history of allergies and is concerned about potential allergic reactions to cancer treatments. She has previously had an allergic reaction to prednisone.      Patient Active Problem List   Diagnosis Date Noted   Symptomatic anemia 01/29/2024   Multiple myeloma (HCC) 01/29/2024   CKD (chronic kidney disease) stage 4, GFR 15-29 ml/min (HCC) 01/29/2024   Anemia 11/26/2023   Hypothyroidism 02/23/2023   Type 2 diabetes mellitus with hyperglycemia, without long-term current use of insulin (HCC) 07/26/2021   Impacted cerumen of right ear 07/06/2020   Type 2 diabetes mellitus with hyperglycemia (HCC) 04/11/2019   Hyperlipidemia LDL goal <70 03/23/2017   Abdominal pain, epigastric 09/03/2016   Strain of right biceps 03/17/2016   Right shoulder pain 03/17/2016   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   Plantar wart of right foot 03/15/2015   Uncontrolled type 2 diabetes mellitus with hyperglycemia (HCC) 09/16/2013   Elevated lipids 09/05/2013   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   OVERWEIGHT 02/07/2009   PHARYNGITIS, ACUTE 06/03/2007   Essential hypertension 05/18/2007   MITRAL VALVE PROLAPSE 05/18/2007   OSTEOPENIA 05/18/2007   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   HSV-1 infection    HYPERTENSION  Hypothyroid    MITRAL VALVE PROLAPSE    OSTEOPENIA    Overweight(278.02)    PHARYNGITIS, ACUTE    Past Surgical History:  Procedure Laterality Date   ABDOMINAL HYSTERECTOMY     CHOLECYSTECTOMY     prolped bladder     WISDOM TOOTH EXTRACTION     Social History   Tobacco Use   Smoking status: Never   Smokeless tobacco: Never  Vaping Use   Vaping status:  Never Used  Substance Use Topics   Alcohol use: No    Alcohol/week: 0.0 standard drinks of alcohol   Drug use: No   Social History   Socioeconomic History   Marital status: Married    Spouse name: Not on file   Number of children: Not on file   Years of education: Not on file   Highest education level: Associate degree: occupational, Scientist, product/process development, or vocational program  Occupational History   Occupation: hairdresser  Tobacco Use   Smoking status: Never   Smokeless tobacco: Never  Vaping Use   Vaping status: Never Used  Substance and Sexual Activity   Alcohol use: No    Alcohol/week: 0.0 standard drinks of alcohol   Drug use: No   Sexual activity: Not Currently    Partners: Male    Birth control/protection: Surgical, Abstinence    Comment: hysterectomy, 17, less than 5  Other Topics Concern   Not on file  Social History Narrative   Not on file   Social Drivers of Health   Financial Resource Strain: Low Risk  (12/07/2023)   Overall Financial Resource Strain (CARDIA)    Difficulty of Paying Living Expenses: Not hard at all  Food Insecurity: Patient Declined (01/30/2024)   Hunger Vital Sign    Worried About Running Out of Food in the Last Year: Patient declined    Ran Out of Food in the Last Year: Patient declined  Transportation Needs: No Transportation Needs (01/30/2024)   PRAPARE - Administrator, Civil Service (Medical): No    Lack of Transportation (Non-Medical): No  Physical Activity: Insufficiently Active (12/07/2023)   Exercise Vital Sign    Days of Exercise per Week: 1 day    Minutes of Exercise per Session: 10 min  Stress: No Stress Concern Present (12/07/2023)   [employer] of Occupational Health - Occupational Stress Questionnaire    Feeling of Stress : Only a little  Social Connections: Socially Integrated (01/30/2024)   Social Connection and Isolation Panel [NHANES]    Frequency of Communication with Friends and Family: More than three times a  week    Frequency of Social Gatherings with Friends and Family: More than three times a week    Attends Religious Services: More than 4 times per year    Active Member of Clubs or Organizations: Yes    Attends Banker Meetings: More than 4 times per year    Marital Status: Married  Catering manager Violence: Not At Risk (01/30/2024)   Humiliation, Afraid, Rape, and Kick questionnaire    Fear of Current or Ex-Partner: No    Emotionally Abused: No    Physically Abused: No    Sexually Abused: No   Family Status  Relation Name Status   Mother Naaman Plummer Deceased   Father Arnetha Gula Deceased   Sister Marlowe Alt (Not Specified)   MGM  Deceased   MGF  Deceased   PGM  Deceased   PGF  Deceased   Other  (Not Specified)  Neg Hx  (Not Specified)  No partnership data on file   Family History  Problem Relation Age of Onset   Hypertension Mother    Heart disease Mother        Had a stent   Emphysema Father    Diabetes Father    COPD Father    Stroke Sister    Breast cancer Neg Hx    Allergies  Allergen Reactions   Mucinex [Guaifenesin Er] Anaphylaxis   Bystolic [Nebivolol Hcl] Other (See Comments)    Lowered B/P markedly   Calcium-Containing Compounds Other (See Comments)    Broke out the mouth inside- tiny bumps   Codeine Nausea And Vomiting   Paxlovid [Nirmatrelvir-Ritonavir] Diarrhea and Nausea And Vomiting   Advicor [Niacin-Lovastatin Er] Rash   Amlodipine Rash   Lisinopril-Hydrochlorothiazide Rash   Other Rash and Other (See Comments)    Eye drops with preservatives   Ramipril Rash   Sulfonamide Derivatives Rash      Review of Systems  Constitutional:  Negative for fever and malaise/fatigue.  HENT:  Negative for congestion.   Eyes:  Negative for blurred vision.  Respiratory:  Negative for cough and shortness of breath.   Cardiovascular:  Negative for chest pain, palpitations and leg swelling.  Gastrointestinal:  Negative for vomiting.  Musculoskeletal:   Negative for back pain.  Skin:  Negative for rash.  Neurological:  Negative for loss of consciousness and headaches.      Objective:     BP (!) 180/100 (BP Location: Left Arm, Patient Position: Sitting, Cuff Size: Small)   Pulse 90   Temp 97.8 F (36.6 C) (Oral)   Resp 18   Ht 5\' 1"  (1.549 m)   Wt 116 lb 3.2 oz (52.7 kg)   SpO2 98%   BMI 21.96 kg/m  BP Readings from Last 3 Encounters:  02/05/24 (!) 180/100  02/04/24 (!) 161/74  02/02/24 (!) 149/71   Wt Readings from Last 3 Encounters:  02/05/24 116 lb 3.2 oz (52.7 kg)  02/04/24 116 lb 6.4 oz (52.8 kg)  02/02/24 120 lb (54.4 kg)   SpO2 Readings from Last 3 Encounters:  02/05/24 98%  02/04/24 100%  02/02/24 97%      Physical Exam Vitals and nursing note reviewed.  Constitutional:      General: She is not in acute distress.    Appearance: Normal appearance. She is well-developed.  HENT:     Head: Normocephalic and atraumatic.  Eyes:     General: No scleral icterus.       Right eye: No discharge.        Left eye: No discharge.  Cardiovascular:     Rate and Rhythm: Normal rate and regular rhythm.     Heart sounds: No murmur heard. Pulmonary:     Effort: Pulmonary effort is normal. No respiratory distress.     Breath sounds: Normal breath sounds.  Musculoskeletal:        General: Normal range of motion.     Cervical back: Normal range of motion and neck supple.     Right lower leg: No edema.     Left lower leg: No edema.  Skin:    General: Skin is warm and dry.  Neurological:     Mental Status: She is alert and oriented to person, place, and time.  Psychiatric:        Mood and Affect: Mood normal.        Behavior: Behavior normal.  Thought Content: Thought content normal.        Judgment: Judgment normal.      No results found for any visits on 02/05/24.  Last CBC Lab Results  Component Value Date   WBC 10.6 (H) 02/04/2024   HGB 9.3 (L) 02/04/2024   HCT 28.1 (L) 02/04/2024   MCV 96.2  02/04/2024   MCH 31.8 02/04/2024   RDW 16.7 (H) 02/04/2024   PLT 179 02/04/2024   Last metabolic panel Lab Results  Component Value Date   GLUCOSE 89 02/04/2024   NA 132 (L) 02/04/2024   K 3.6 02/04/2024   CL 106 02/04/2024   CO2 26 02/04/2024   BUN 31 (H) 02/04/2024   CREATININE 1.61 (H) 02/04/2024   GFRNONAA 33 (L) 02/04/2024   CALCIUM 8.4 (L) 02/04/2024   PHOS 5.1 (H) 01/29/2024   PROT 11.2 (H) 02/04/2024   ALBUMIN 3.0 (L) 02/04/2024   LABGLOB 8.4 (H) 01/29/2024   BILITOT 0.3 02/04/2024   ALKPHOS 23 (L) 02/04/2024   AST 30 02/04/2024   ALT 49 (H) 02/04/2024   ANIONGAP 0 (L) 02/04/2024   Last lipids Lab Results  Component Value Date   CHOL 82 12/03/2023   HDL 42.40 12/03/2023   LDLCALC 24 12/03/2023   LDLDIRECT 53.0 05/25/2018   TRIG 80.0 12/03/2023   CHOLHDL 2 12/03/2023   Last hemoglobin A1c Lab Results  Component Value Date   HGBA1C 6.2 12/03/2023   Last thyroid functions Lab Results  Component Value Date   TSH 0.725 01/30/2024   T4TOTAL 6.7 12/03/2023   Last vitamin D Lab Results  Component Value Date   VD25OH 75.84 01/29/2024   Last vitamin B12 and Folate Lab Results  Component Value Date   VITAMINB12 574 02/04/2024   FOLATE 12.5 11/26/2023      The ASCVD Risk score (Arnett DK, et al., 2019) failed to calculate for the following reasons:   The valid total cholesterol range is 130 to 320 mg/dL    Assessment & Plan:   Problem List Items Addressed This Visit       Unprioritized   Uncontrolled type 2 diabetes mellitus with hyperglycemia (HCC) - Primary   hgba1c to be checked,  minimize simple carbs. Increase exercise as tolerated. Continue current meds Sliding scale insulin given to pt to use since steroids will be given with each treatment for multiple myeloma        Relevant Medications   insulin aspart (NOVOLOG FLEXPEN) 100 UNIT/ML FlexPen   Insulin Pen Needle (PEN NEEDLES) 31G X 6 MM MISC   Multiple myeloma (HCC)   Per oncology      Assessment and Plan    Multiple Myeloma   Newly diagnosed multiple myeloma will be treated with subcutaneous bortezomib, dexamethasone, and daratumumab. Concerns about steroid-induced hyperglycemia and potential allergic reactions were discussed. Many patients with multiple myeloma have been treated successfully and live for years. Close monitoring of blood glucose levels is essential, with potential insulin therapy. Start treatment with bortezomib, dexamethasone, and daratumumab as scheduled. Monitor blood glucose levels closely post-treatment and provide insulin on a sliding scale based on glucose readings. Ensure availability of a glucose monitor and insulin pen, and educate on her use. Encourage hydration with at least three liters of water daily.  Diabetes Mellitus   Diabetes management is complicated by upcoming steroid treatment for multiple myeloma. Current medication includes Rybelsus, maintaining blood glucose levels around 99-100. There may be a need for insulin therapy during steroid treatment and possibly long-term.  Continue Rybelsus as prescribed and monitor blood glucose levels three times daily. Provide insulin on a sliding scale based on glucose readings and educate on insulin pen use. Consider discontinuing Rybelsus if significant weight loss continues.  Hypertension   Hypertension with nocturnal episodes of elevated blood pressure is likely exacerbated by anxiety. The current regimen includes valsartan and hydralazine. Blood pressure readings have been variable, with some episodes of hypotension. Regular monitoring and managing hypotension were discussed. Continue valsartan and hydralazine as prescribed, monitor blood pressure regularly, and educate on managing hypotension. Adjust hydralazine dosing to avoid hypotension.  Anxiety   Anxiety related to the recent multiple myeloma diagnosis and treatment is managed with as-needed clonazepam, which is effective but causes sedation.  There is a preference for minimal use. Consider adding a non-sedating, non-addictive daily medication if anxiety persists. Provide counseling support as needed.  General Health Maintenance   The importance of hydration and vitamin supplementation was discussed. Continue vitamin D and B complex supplementation. Consider a continuous glucose monitor for easier tracking. Encourage regular hydration with at least three liters of water daily.  Follow-up   Follow up with the oncologist as scheduled and with the nephrologist regarding kidney biopsy necessity. Monitor blood pressure and blood glucose levels regularly. Contact the primary care physician if any issues arise.        Return if symptoms worsen or fail to improve.    Donato Schultz, DO

## 2024-02-05 NOTE — Assessment & Plan Note (Signed)
Per oncology

## 2024-02-06 ENCOUNTER — Other Ambulatory Visit: Payer: Self-pay

## 2024-02-09 ENCOUNTER — Other Ambulatory Visit: Payer: Self-pay | Admitting: Family Medicine

## 2024-02-09 ENCOUNTER — Other Ambulatory Visit: Payer: Self-pay | Admitting: Cardiovascular Disease

## 2024-02-09 DIAGNOSIS — E1165 Type 2 diabetes mellitus with hyperglycemia: Secondary | ICD-10-CM

## 2024-02-10 ENCOUNTER — Encounter: Payer: Self-pay | Admitting: Hematology

## 2024-02-11 ENCOUNTER — Encounter (HOSPITAL_COMMUNITY): Payer: Self-pay | Admitting: Hematology

## 2024-02-11 ENCOUNTER — Telehealth: Payer: Self-pay | Admitting: Hematology

## 2024-02-11 ENCOUNTER — Encounter (HOSPITAL_COMMUNITY)
Admission: RE | Admit: 2024-02-11 | Discharge: 2024-02-11 | Disposition: A | Discharge status: Home / Self Care | Payer: Medicare Other | Source: Ambulatory Visit | Attending: Hematology | Admitting: Hematology

## 2024-02-11 ENCOUNTER — Other Ambulatory Visit: Payer: Self-pay

## 2024-02-11 DIAGNOSIS — C9000 Multiple myeloma not having achieved remission: Secondary | ICD-10-CM | POA: Diagnosis not present

## 2024-02-11 LAB — GLUCOSE, CAPILLARY: Glucose-Capillary: 120 mg/dL — ABNORMAL HIGH (ref 70–99)

## 2024-02-11 MED ORDER — FLUDEOXYGLUCOSE F - 18 (FDG) INJECTION
5.7900 | Freq: Once | INTRAVENOUS | Status: AC
Start: 2024-02-11 — End: 2024-02-11
  Administered 2024-02-11: 5.79 via INTRAVENOUS

## 2024-02-11 NOTE — Telephone Encounter (Signed)
 Spoke with patient confirming all upcoming appointments  

## 2024-02-18 ENCOUNTER — Ambulatory Visit (HOSPITAL_COMMUNITY): Payer: Medicare Other

## 2024-02-18 ENCOUNTER — Other Ambulatory Visit: Payer: Self-pay | Admitting: Hematology

## 2024-02-18 ENCOUNTER — Telehealth: Payer: Self-pay | Admitting: Hematology

## 2024-02-18 ENCOUNTER — Encounter (HOSPITAL_COMMUNITY): Payer: Self-pay

## 2024-02-18 DIAGNOSIS — C9000 Multiple myeloma not having achieved remission: Secondary | ICD-10-CM

## 2024-02-18 MED ORDER — DEXAMETHASONE 4 MG PO TABS: ORAL_TABLET | ORAL | 1 refills | Status: DC

## 2024-02-18 MED ORDER — ONDANSETRON HCL 8 MG PO TABS: ORAL_TABLET | ORAL | 1 refills | Status: DC

## 2024-02-18 MED ORDER — ACYCLOVIR 400 MG PO TABS: 400.0000 mg | ORAL_TABLET | Freq: Two times a day (BID) | ORAL | 5 refills | Status: DC

## 2024-02-18 MED ORDER — PROCHLORPERAZINE MALEATE 10 MG PO TABS
10.0000 mg | ORAL_TABLET | Freq: Four times a day (QID) | ORAL | 1 refills | Status: DC | PRN
Start: 2024-02-18 — End: 2024-05-04

## 2024-02-18 NOTE — Telephone Encounter (Signed)
Spoke with patient husband confirming upcoming appointment  

## 2024-02-19 ENCOUNTER — Inpatient Hospital Stay: Payer: Medicare Other

## 2024-02-19 ENCOUNTER — Encounter: Payer: Self-pay | Admitting: Hematology

## 2024-02-19 DIAGNOSIS — N179 Acute kidney failure, unspecified: Secondary | ICD-10-CM | POA: Diagnosis not present

## 2024-02-19 DIAGNOSIS — Z8616 Personal history of COVID-19: Secondary | ICD-10-CM | POA: Diagnosis not present

## 2024-02-19 DIAGNOSIS — I10 Essential (primary) hypertension: Secondary | ICD-10-CM | POA: Diagnosis not present

## 2024-02-19 DIAGNOSIS — E039 Hypothyroidism, unspecified: Secondary | ICD-10-CM | POA: Diagnosis not present

## 2024-02-19 DIAGNOSIS — C9000 Multiple myeloma not having achieved remission: Secondary | ICD-10-CM | POA: Diagnosis not present

## 2024-02-19 DIAGNOSIS — E119 Type 2 diabetes mellitus without complications: Secondary | ICD-10-CM | POA: Diagnosis not present

## 2024-02-19 LAB — CMP (CANCER CENTER ONLY)
ALT: 19 U/L (ref 0–44)
AST: 17 U/L (ref 15–41)
Albumin: 3.2 g/dL — ABNORMAL LOW (ref 3.5–5.0)
Alkaline Phosphatase: 26 U/L — ABNORMAL LOW (ref 38–126)
Anion gap: 1 — ABNORMAL LOW (ref 5–15)
BUN: 81 mg/dL — ABNORMAL HIGH (ref 8–23)
CO2: 23 mmol/L (ref 22–32)
Calcium: 9.3 mg/dL (ref 8.9–10.3)
Chloride: 101 mmol/L (ref 98–111)
Creatinine: 1.81 mg/dL — ABNORMAL HIGH (ref 0.44–1.00)
GFR, Estimated: 29 mL/min — ABNORMAL LOW (ref >60)
Glucose, Bld: 130 mg/dL — ABNORMAL HIGH (ref 70–99)
Potassium: 3.9 mmol/L (ref 3.5–5.1)
Sodium: 125 mmol/L — ABNORMAL LOW (ref 135–145)
Total Bilirubin: 0.3 mg/dL (ref 0.0–1.2)
Total Protein: >12 g/dL — ABNORMAL HIGH (ref 6.5–8.1)

## 2024-02-19 LAB — CBC WITH DIFFERENTIAL (CANCER CENTER ONLY)
Abs Immature Granulocytes: 0.06 10*3/uL (ref 0.00–0.07)
Basophils Absolute: 0.1 10*3/uL (ref 0.0–0.1)
Basophils Relative: 1 %
Eosinophils Absolute: 0.1 10*3/uL (ref 0.0–0.5)
Eosinophils Relative: 1 %
HCT: 25.3 % — ABNORMAL LOW (ref 36.0–46.0)
Hemoglobin: 8.3 g/dL — ABNORMAL LOW (ref 12.0–15.0)
Immature Granulocytes: 1 %
Lymphocytes Relative: 26 %
Lymphs Abs: 2 10*3/uL (ref 0.7–4.0)
MCH: 31.9 pg (ref 26.0–34.0)
MCHC: 32.8 g/dL (ref 30.0–36.0)
MCV: 97.3 fL (ref 80.0–100.0)
Monocytes Absolute: 0.7 10*3/uL (ref 0.1–1.0)
Monocytes Relative: 9 %
Neutro Abs: 4.9 10*3/uL (ref 1.7–7.7)
Neutrophils Relative %: 62 %
Platelet Count: 173 10*3/uL (ref 150–400)
RBC: 2.6 MIL/uL — ABNORMAL LOW (ref 3.87–5.11)
RDW: 16.7 % — ABNORMAL HIGH (ref 11.5–15.5)
WBC Count: 7.8 10*3/uL (ref 4.0–10.5)
nRBC: 0 % (ref 0.0–0.2)

## 2024-02-19 LAB — TYPE AND SCREEN
ABO/RH(D): A POS
Antibody Screen: NEGATIVE

## 2024-02-22 ENCOUNTER — Encounter: Payer: Self-pay | Admitting: Hematology

## 2024-02-22 LAB — PRETREATMENT RBC PHENOTYPE: DAT, IgG: NEGATIVE

## 2024-02-22 NOTE — Progress Notes (Signed)
 Patient's spouse returned call and introduced myself as Dance movement psychotherapist. Patient has 2 insurances and may not have any concerns. Advised he has my card should they have any financial questions or concerns and he may contact secondary insurance to confirm they will pay what primary does not. He verbalized understanding.

## 2024-02-22 NOTE — Progress Notes (Signed)
 Received voicemail from patient's spouse after receiving my card per my request.  Returned call and left voicemail with my contact name and number.

## 2024-02-23 ENCOUNTER — Other Ambulatory Visit: Payer: Medicare Other

## 2024-02-23 ENCOUNTER — Other Ambulatory Visit: Payer: Self-pay | Admitting: Hematology

## 2024-02-23 ENCOUNTER — Inpatient Hospital Stay: Payer: Medicare Other | Attending: Hematology

## 2024-02-23 VITALS — BP 122/58 | HR 80 | Temp 97.9°F | Resp 16

## 2024-02-23 DIAGNOSIS — C9000 Multiple myeloma not having achieved remission: Secondary | ICD-10-CM | POA: Insufficient documentation

## 2024-02-23 DIAGNOSIS — Z79899 Other long term (current) drug therapy: Secondary | ICD-10-CM | POA: Diagnosis not present

## 2024-02-23 DIAGNOSIS — Z79620 Long term (current) use of immunosuppressive biologic: Secondary | ICD-10-CM | POA: Insufficient documentation

## 2024-02-23 DIAGNOSIS — Z5112 Encounter for antineoplastic immunotherapy: Secondary | ICD-10-CM | POA: Insufficient documentation

## 2024-02-23 MED ORDER — ACETAMINOPHEN 325 MG PO TABS
650.0000 mg | ORAL_TABLET | Freq: Once | ORAL | Status: AC
Start: 2024-02-23 — End: 2024-02-23
  Administered 2024-02-23: 650 mg via ORAL
  Filled 2024-02-23: qty 2

## 2024-02-23 MED ORDER — DIPHENHYDRAMINE HCL 25 MG PO CAPS
50.0000 mg | ORAL_CAPSULE | Freq: Once | ORAL | Status: AC
Start: 2024-02-23 — End: 2024-02-23
  Administered 2024-02-23: 50 mg via ORAL
  Filled 2024-02-23: qty 2

## 2024-02-23 MED ORDER — MONTELUKAST SODIUM 10 MG PO TABS
10.0000 mg | ORAL_TABLET | Freq: Once | ORAL | Status: AC
Start: 2024-02-23 — End: 2024-02-23
  Administered 2024-02-23: 10 mg via ORAL
  Filled 2024-02-23: qty 1

## 2024-02-23 MED ORDER — DARATUMUMAB-HYALURONIDASE-FIHJ 1800-30000 MG-UT/15ML ~~LOC~~ SOLN
1800.0000 mg | Freq: Once | SUBCUTANEOUS | Status: AC
  Administered 2024-02-23: 1800 mg via SUBCUTANEOUS
  Filled 2024-02-23: qty 15

## 2024-02-23 MED ORDER — DEXAMETHASONE 4 MG PO TABS
40.0000 mg | ORAL_TABLET | Freq: Once | ORAL | Status: AC
  Administered 2024-02-23: 40 mg via ORAL
  Filled 2024-02-23: qty 10

## 2024-02-23 MED ORDER — BORTEZOMIB CHEMO SQ INJECTION 3.5 MG (2.5MG/ML)
1.5000 mg/m2 | Freq: Once | INTRAMUSCULAR | Status: AC
  Administered 2024-02-23: 2.25 mg via SUBCUTANEOUS
  Filled 2024-02-23: qty 0.9

## 2024-02-23 NOTE — Progress Notes (Signed)
 Per Dr Candise Che, okay to proceed with treatment today with creatine 1.8

## 2024-02-23 NOTE — Patient Instructions (Signed)
 [facility] - A DEPT OF [facility]  Discharge Instructions: Thank you for choosing [facility] to provide your oncology and hematology care.   If you have a lab appointment with the Cancer Center, please go directly to the Cancer Center and check in at the registration area.   Wear comfortable clothing and clothing appropriate for easy access to any Portacath or PICC line.   We strive to give you quality time with your provider. You may need to reschedule your appointment if you arrive late (15 or more minutes).  Arriving late affects you and other patients whose appointments are after yours.  Also, if you miss three or more appointments without notifying the office, you may be dismissed from the clinic at the provider's discretion.      For prescription refill requests, have your pharmacy contact our office and allow 72 hours for refills to be completed.    Today you received the following chemotherapy and/or immunotherapy agents Velcade, Darzalex Faspro.    To help prevent nausea and vomiting after your treatment, we encourage you to take your nausea medication as directed.  BELOW ARE SYMPTOMS THAT SHOULD BE REPORTED IMMEDIATELY: *FEVER GREATER THAN 100.4 F (38 C) OR HIGHER *CHILLS OR SWEATING *NAUSEA AND VOMITING THAT IS NOT CONTROLLED WITH YOUR NAUSEA MEDICATION *UNUSUAL SHORTNESS OF BREATH *UNUSUAL BRUISING OR BLEEDING *URINARY PROBLEMS (pain or burning when urinating, or frequent urination) *BOWEL PROBLEMS (unusual diarrhea, constipation, pain near the anus) TENDERNESS IN MOUTH AND THROAT WITH OR WITHOUT PRESENCE OF ULCERS (sore throat, sores in mouth, or a toothache) UNUSUAL RASH, SWELLING OR PAIN  UNUSUAL VAGINAL DISCHARGE OR ITCHING   Items with * indicate a potential emergency and should be followed up as soon as possible or go to the Emergency Department if any problems should occur.  Please show the CHEMOTHERAPY ALERT CARD or  IMMUNOTHERAPY ALERT CARD at check-in to the Emergency Department and triage nurse.  Should you have questions after your visit or need to cancel or reschedule your appointment, please contact [facility] - A DE[facility]  Dept: [phone]  and follow the prompts.  Office hours are 8:00 a.m. to 4:30 p.m. Monday - Friday. Please note that voicemails left after 4:00 p.m. may not be returned until the following business day.  We are closed weekends and major holidays. You have access to a nurse at all times for urgent questions. Please call the main number to the clinic Dept: [phone] and follow the prompts.   For any non-urgent questions, you may also contact your provider using MyChart. We now offer e-Visits for anyone 86 and older to request care online for non-urgent symptoms. For details visit [email or web address].   Also download the MyChart app! Go to the app store, search "MyChart", open the app, select [place], and log in with your MyChart username and password.

## 2024-02-23 NOTE — Progress Notes (Signed)
 Pt observed for 2 hours post Darzalex Faspro injection. Pt tolerated Tx well w/out incident. VSS at discharge.  Wheelchair assist to lobby d/t drowsiness from Benadryl.

## 2024-02-24 ENCOUNTER — Telehealth: Payer: Self-pay | Admitting: Gastroenterology

## 2024-02-24 ENCOUNTER — Telehealth: Payer: Self-pay

## 2024-02-24 DIAGNOSIS — E871 Hypo-osmolality and hyponatremia: Secondary | ICD-10-CM | POA: Diagnosis not present

## 2024-02-24 DIAGNOSIS — D631 Anemia in chronic kidney disease: Secondary | ICD-10-CM | POA: Diagnosis not present

## 2024-02-24 DIAGNOSIS — E785 Hyperlipidemia, unspecified: Secondary | ICD-10-CM | POA: Diagnosis not present

## 2024-02-24 DIAGNOSIS — I129 Hypertensive chronic kidney disease with stage 1 through stage 4 chronic kidney disease, or unspecified chronic kidney disease: Secondary | ICD-10-CM | POA: Diagnosis not present

## 2024-02-24 DIAGNOSIS — N179 Acute kidney failure, unspecified: Secondary | ICD-10-CM | POA: Diagnosis not present

## 2024-02-24 DIAGNOSIS — C9000 Multiple myeloma not having achieved remission: Secondary | ICD-10-CM | POA: Diagnosis not present

## 2024-02-24 DIAGNOSIS — N814 Uterovaginal prolapse, unspecified: Secondary | ICD-10-CM | POA: Diagnosis not present

## 2024-02-24 DIAGNOSIS — N189 Chronic kidney disease, unspecified: Secondary | ICD-10-CM | POA: Diagnosis not present

## 2024-02-24 DIAGNOSIS — E1122 Type 2 diabetes mellitus with diabetic chronic kidney disease: Secondary | ICD-10-CM | POA: Diagnosis not present

## 2024-02-24 NOTE — Telephone Encounter (Signed)
 Patient's husband called stating patient has scheduled EGD for 04/15/2024 but is wanting to know if it's needing to be done due to wife having to go through chemo. They advised them that if it's not urgent, patient would have to wait at least 6 months. Requesting a call back. Thank you.

## 2024-02-24 NOTE — Telephone Encounter (Signed)
LM for patient that this nurse was calling to see how they were doing after their treatment. Please call back to Dr. Kale's nurse at [phone] if they have any questions or concerns regarding the treatment. 

## 2024-02-24 NOTE — Telephone Encounter (Signed)
 The EGD was follow up to document mucosal healing. Please advise.

## 2024-02-24 NOTE — Telephone Encounter (Signed)
-----   Message from Nurse Cortney P sent at 02/23/2024 12:31 PM EST ----- Regarding: Dr Candise Che first time Velcade / Darzalex Faspro Dr Candise Che patient, first time Velcade, Darzalex Faspro injections. Dr Candise Che is decreasing the dose of Benadryl for next treatment due to drowsiness, other than that, patient tolerated treatment well.

## 2024-02-25 ENCOUNTER — Encounter: Payer: Self-pay | Admitting: Hematology

## 2024-02-25 ENCOUNTER — Ambulatory Visit: Payer: Medicare Other | Admitting: Family Medicine

## 2024-02-25 ENCOUNTER — Other Ambulatory Visit: Payer: Self-pay | Admitting: Physician Assistant

## 2024-02-25 DIAGNOSIS — C9000 Multiple myeloma not having achieved remission: Secondary | ICD-10-CM

## 2024-02-25 NOTE — Telephone Encounter (Signed)
 We can hold off EGD until she completes chemotherapy, please schedule office visit in 6 months when she is through chemotherapy to follow-up.  Thank you

## 2024-02-25 NOTE — Telephone Encounter (Signed)
 Spouse with the spouse. Agrees to this plan. We will reach out to them in the fall to help patient get scheduled with Dr Lavon Paganini.

## 2024-02-29 ENCOUNTER — Other Ambulatory Visit: Payer: Self-pay

## 2024-02-29 ENCOUNTER — Encounter (HOSPITAL_COMMUNITY): Payer: Self-pay

## 2024-02-29 ENCOUNTER — Emergency Department (HOSPITAL_COMMUNITY)

## 2024-02-29 ENCOUNTER — Inpatient Hospital Stay (HOSPITAL_COMMUNITY)
Admission: EM | Admit: 2024-02-29 | Discharge: 2024-03-02 | DRG: 312 | Disposition: A | Discharge status: Home / Self Care | Attending: Internal Medicine | Admitting: Internal Medicine

## 2024-02-29 ENCOUNTER — Encounter: Payer: Self-pay | Admitting: Hematology

## 2024-02-29 ENCOUNTER — Ambulatory Visit: Payer: Medicare Other | Admitting: Cardiovascular Disease

## 2024-02-29 DIAGNOSIS — M8580 Other specified disorders of bone density and structure, unspecified site: Secondary | ICD-10-CM | POA: Diagnosis not present

## 2024-02-29 DIAGNOSIS — F419 Anxiety disorder, unspecified: Secondary | ICD-10-CM | POA: Diagnosis not present

## 2024-02-29 DIAGNOSIS — E785 Hyperlipidemia, unspecified: Secondary | ICD-10-CM | POA: Diagnosis present

## 2024-02-29 DIAGNOSIS — Z90710 Acquired absence of both cervix and uterus: Secondary | ICD-10-CM

## 2024-02-29 DIAGNOSIS — Z79890 Hormone replacement therapy (postmenopausal): Secondary | ICD-10-CM | POA: Diagnosis not present

## 2024-02-29 DIAGNOSIS — Z9049 Acquired absence of other specified parts of digestive tract: Secondary | ICD-10-CM

## 2024-02-29 DIAGNOSIS — D649 Anemia, unspecified: Secondary | ICD-10-CM | POA: Diagnosis not present

## 2024-02-29 DIAGNOSIS — Z1152 Encounter for screening for COVID-19: Secondary | ICD-10-CM | POA: Diagnosis not present

## 2024-02-29 DIAGNOSIS — R55 Syncope and collapse: Principal | ICD-10-CM

## 2024-02-29 DIAGNOSIS — I129 Hypertensive chronic kidney disease with stage 1 through stage 4 chronic kidney disease, or unspecified chronic kidney disease: Secondary | ICD-10-CM | POA: Diagnosis not present

## 2024-02-29 DIAGNOSIS — D631 Anemia in chronic kidney disease: Secondary | ICD-10-CM | POA: Diagnosis not present

## 2024-02-29 DIAGNOSIS — Z9889 Other specified postprocedural states: Secondary | ICD-10-CM

## 2024-02-29 DIAGNOSIS — E872 Acidosis, unspecified: Secondary | ICD-10-CM | POA: Diagnosis present

## 2024-02-29 DIAGNOSIS — Z794 Long term (current) use of insulin: Secondary | ICD-10-CM

## 2024-02-29 DIAGNOSIS — Z833 Family history of diabetes mellitus: Secondary | ICD-10-CM

## 2024-02-29 DIAGNOSIS — Z825 Family history of asthma and other chronic lower respiratory diseases: Secondary | ICD-10-CM

## 2024-02-29 DIAGNOSIS — Z888 Allergy status to other drugs, medicaments and biological substances status: Secondary | ICD-10-CM

## 2024-02-29 DIAGNOSIS — Z79899 Other long term (current) drug therapy: Secondary | ICD-10-CM | POA: Diagnosis not present

## 2024-02-29 DIAGNOSIS — Z7960 Long term (current) use of unspecified immunomodulators and immunosuppressants: Secondary | ICD-10-CM

## 2024-02-29 DIAGNOSIS — E039 Hypothyroidism, unspecified: Secondary | ICD-10-CM | POA: Diagnosis present

## 2024-02-29 DIAGNOSIS — N184 Chronic kidney disease, stage 4 (severe): Secondary | ICD-10-CM | POA: Diagnosis present

## 2024-02-29 DIAGNOSIS — Z882 Allergy status to sulfonamides status: Secondary | ICD-10-CM

## 2024-02-29 DIAGNOSIS — R531 Weakness: Secondary | ICD-10-CM | POA: Diagnosis not present

## 2024-02-29 DIAGNOSIS — Z87892 Personal history of anaphylaxis: Secondary | ICD-10-CM

## 2024-02-29 DIAGNOSIS — I672 Cerebral atherosclerosis: Secondary | ICD-10-CM | POA: Diagnosis not present

## 2024-02-29 DIAGNOSIS — E861 Hypovolemia: Secondary | ICD-10-CM | POA: Diagnosis present

## 2024-02-29 DIAGNOSIS — E871 Hypo-osmolality and hyponatremia: Secondary | ICD-10-CM | POA: Diagnosis present

## 2024-02-29 DIAGNOSIS — E1122 Type 2 diabetes mellitus with diabetic chronic kidney disease: Secondary | ICD-10-CM | POA: Diagnosis not present

## 2024-02-29 DIAGNOSIS — D630 Anemia in neoplastic disease: Secondary | ICD-10-CM | POA: Diagnosis not present

## 2024-02-29 DIAGNOSIS — E875 Hyperkalemia: Secondary | ICD-10-CM | POA: Diagnosis present

## 2024-02-29 DIAGNOSIS — I951 Orthostatic hypotension: Principal | ICD-10-CM | POA: Diagnosis present

## 2024-02-29 DIAGNOSIS — N1832 Chronic kidney disease, stage 3b: Secondary | ICD-10-CM | POA: Diagnosis present

## 2024-02-29 DIAGNOSIS — Z8249 Family history of ischemic heart disease and other diseases of the circulatory system: Secondary | ICD-10-CM

## 2024-02-29 DIAGNOSIS — I341 Nonrheumatic mitral (valve) prolapse: Secondary | ICD-10-CM | POA: Diagnosis present

## 2024-02-29 DIAGNOSIS — E860 Dehydration: Secondary | ICD-10-CM | POA: Diagnosis not present

## 2024-02-29 DIAGNOSIS — R0602 Shortness of breath: Secondary | ICD-10-CM | POA: Diagnosis not present

## 2024-02-29 DIAGNOSIS — N179 Acute kidney failure, unspecified: Secondary | ICD-10-CM | POA: Diagnosis present

## 2024-02-29 DIAGNOSIS — I253 Aneurysm of heart: Secondary | ICD-10-CM | POA: Diagnosis not present

## 2024-02-29 DIAGNOSIS — R0989 Other specified symptoms and signs involving the circulatory and respiratory systems: Secondary | ICD-10-CM | POA: Diagnosis not present

## 2024-02-29 DIAGNOSIS — R9389 Abnormal findings on diagnostic imaging of other specified body structures: Secondary | ICD-10-CM | POA: Diagnosis not present

## 2024-02-29 DIAGNOSIS — C9000 Multiple myeloma not having achieved remission: Secondary | ICD-10-CM | POA: Diagnosis present

## 2024-02-29 DIAGNOSIS — R0902 Hypoxemia: Secondary | ICD-10-CM | POA: Diagnosis not present

## 2024-02-29 DIAGNOSIS — Z885 Allergy status to narcotic agent status: Secondary | ICD-10-CM

## 2024-02-29 DIAGNOSIS — Z823 Family history of stroke: Secondary | ICD-10-CM

## 2024-02-29 DIAGNOSIS — I959 Hypotension, unspecified: Secondary | ICD-10-CM | POA: Diagnosis not present

## 2024-02-29 LAB — CBC WITH DIFFERENTIAL/PLATELET
Abs Immature Granulocytes: 0.03 10*3/uL (ref 0.00–0.07)
Basophils Absolute: 0 10*3/uL (ref 0.0–0.1)
Basophils Relative: 0 %
Eosinophils Absolute: 0.1 10*3/uL (ref 0.0–0.5)
Eosinophils Relative: 2 %
HCT: 23.1 % — ABNORMAL LOW (ref 36.0–46.0)
Hemoglobin: 7.3 g/dL — ABNORMAL LOW (ref 12.0–15.0)
Immature Granulocytes: 0 %
Lymphocytes Relative: 21 %
Lymphs Abs: 1.7 10*3/uL (ref 0.7–4.0)
MCH: 31.6 pg (ref 26.0–34.0)
MCHC: 31.6 g/dL (ref 30.0–36.0)
MCV: 100 fL (ref 80.0–100.0)
Monocytes Absolute: 1 10*3/uL (ref 0.1–1.0)
Monocytes Relative: 12 %
Neutro Abs: 5 10*3/uL (ref 1.7–7.7)
Neutrophils Relative %: 65 %
Platelets: 205 10*3/uL (ref 150–400)
RBC: 2.31 MIL/uL — ABNORMAL LOW (ref 3.87–5.11)
RDW: 17.2 % — ABNORMAL HIGH (ref 11.5–15.5)
WBC: 7.8 10*3/uL (ref 4.0–10.5)
nRBC: 0 % (ref 0.0–0.2)

## 2024-02-29 LAB — URINALYSIS, W/ REFLEX TO CULTURE (INFECTION SUSPECTED)
Bacteria, UA: NONE SEEN
Bilirubin Urine: NEGATIVE
Glucose, UA: NEGATIVE mg/dL
Hgb urine dipstick: NEGATIVE
Ketones, ur: NEGATIVE mg/dL
Leukocytes,Ua: NEGATIVE
Nitrite: NEGATIVE
Protein, ur: NEGATIVE mg/dL
Specific Gravity, Urine: 1.012 (ref 1.005–1.030)
pH: 5 (ref 5.0–8.0)

## 2024-02-29 LAB — BASIC METABOLIC PANEL
Anion gap: 2 — ABNORMAL LOW (ref 5–15)
BUN: 103 mg/dL — ABNORMAL HIGH (ref 8–23)
CO2: 16 mmol/L — ABNORMAL LOW (ref 22–32)
Calcium: 8.4 mg/dL — ABNORMAL LOW (ref 8.9–10.3)
Chloride: 112 mmol/L — ABNORMAL HIGH (ref 98–111)
Creatinine, Ser: 1.75 mg/dL — ABNORMAL HIGH (ref 0.44–1.00)
GFR, Estimated: 30 mL/min — ABNORMAL LOW (ref >60)
Glucose, Bld: 114 mg/dL — ABNORMAL HIGH (ref 70–99)
Potassium: 5.2 mmol/L — ABNORMAL HIGH (ref 3.5–5.1)
Sodium: 130 mmol/L — ABNORMAL LOW (ref 135–145)

## 2024-02-29 LAB — CBC
HCT: 24.6 % — ABNORMAL LOW (ref 36.0–46.0)
Hemoglobin: 7.6 g/dL — ABNORMAL LOW (ref 12.0–15.0)
MCH: 31.5 pg (ref 26.0–34.0)
MCHC: 30.9 g/dL (ref 30.0–36.0)
MCV: 102.1 fL — ABNORMAL HIGH (ref 80.0–100.0)
Platelets: 201 10*3/uL (ref 150–400)
RBC: 2.41 MIL/uL — ABNORMAL LOW (ref 3.87–5.11)
RDW: 17.1 % — ABNORMAL HIGH (ref 11.5–15.5)
WBC: 8.8 10*3/uL (ref 4.0–10.5)
nRBC: 0 % (ref 0.0–0.2)

## 2024-02-29 LAB — COMPREHENSIVE METABOLIC PANEL
ALT: 40 U/L (ref 0–44)
AST: 22 U/L (ref 15–41)
Albumin: 2.5 g/dL — ABNORMAL LOW (ref 3.5–5.0)
Alkaline Phosphatase: 23 U/L — ABNORMAL LOW (ref 38–126)
Anion gap: 4 — ABNORMAL LOW (ref 5–15)
BUN: 116 mg/dL — ABNORMAL HIGH (ref 8–23)
CO2: 16 mmol/L — ABNORMAL LOW (ref 22–32)
Calcium: 8.4 mg/dL — ABNORMAL LOW (ref 8.9–10.3)
Chloride: 111 mmol/L (ref 98–111)
Creatinine, Ser: 2.17 mg/dL — ABNORMAL HIGH (ref 0.44–1.00)
GFR, Estimated: 23 mL/min — ABNORMAL LOW (ref >60)
Glucose, Bld: 138 mg/dL — ABNORMAL HIGH (ref 70–99)
Potassium: 3.8 mmol/L (ref 3.5–5.1)
Sodium: 131 mmol/L — ABNORMAL LOW (ref 135–145)
Total Bilirubin: 0.4 mg/dL (ref 0.0–1.2)
Total Protein: 11.4 g/dL — ABNORMAL HIGH (ref 6.5–8.1)

## 2024-02-29 LAB — RESP PANEL BY RT-PCR (RSV, FLU A&B, COVID)  RVPGX2
Influenza A by PCR: NEGATIVE
Influenza B by PCR: NEGATIVE
Resp Syncytial Virus by PCR: NEGATIVE
SARS Coronavirus 2 by RT PCR: NEGATIVE

## 2024-02-29 LAB — GLUCOSE, CAPILLARY
Glucose-Capillary: 94 mg/dL (ref 70–99)
Glucose-Capillary: 122 mg/dL — ABNORMAL HIGH (ref 70–99)

## 2024-02-29 LAB — CREATININE, SERUM
Creatinine, Ser: 1.06 mg/dL — ABNORMAL HIGH (ref 0.44–1.00)
GFR, Estimated: 54 mL/min — ABNORMAL LOW (ref >60)

## 2024-02-29 LAB — TROPONIN I (HIGH SENSITIVITY)
Troponin I (High Sensitivity): 5 ng/L (ref <18)
Troponin I (High Sensitivity): 7 ng/L (ref <18)

## 2024-02-29 MED ORDER — SODIUM CHLORIDE 0.9 % IV BOLUS
500.0000 mL | Freq: Once | INTRAVENOUS | Status: AC
  Administered 2024-02-29: 500 mL via INTRAVENOUS

## 2024-02-29 MED ORDER — HEPARIN SODIUM (PORCINE) 5000 UNIT/ML IJ SOLN
5000.0000 [IU] | Freq: Three times a day (TID) | INTRAMUSCULAR | Status: DC
  Administered 2024-03-01 – 2024-03-02 (×2): 5000 [IU] via SUBCUTANEOUS
  Filled 2024-02-29 (×4): qty 1

## 2024-02-29 MED ORDER — INSULIN ASPART 100 UNIT/ML IJ SOLN
0.0000 [IU] | Freq: Three times a day (TID) | INTRAMUSCULAR | Status: DC
  Administered 2024-03-01: 2 [IU] via SUBCUTANEOUS
  Filled 2024-02-29: qty 0.09

## 2024-02-29 MED ORDER — SODIUM CHLORIDE 0.9 % IV SOLN: Freq: Once | INTRAVENOUS | Status: DC

## 2024-02-29 MED ORDER — SODIUM ZIRCONIUM CYCLOSILICATE 10 G PO PACK
10.0000 g | PACK | Freq: Every day | ORAL | Status: DC
  Administered 2024-02-29: 10 g via ORAL
  Filled 2024-02-29 (×2): qty 1

## 2024-02-29 MED ORDER — LACTATED RINGERS IV SOLN: INTRAVENOUS | Status: AC

## 2024-02-29 MED ORDER — ACETAMINOPHEN 650 MG RE SUPP: 650.0000 mg | Freq: Four times a day (QID) | RECTAL | Status: DC | PRN

## 2024-02-29 MED ORDER — ACETAMINOPHEN 325 MG PO TABS: 650.0000 mg | ORAL_TABLET | Freq: Four times a day (QID) | ORAL | Status: DC | PRN

## 2024-02-29 MED ORDER — INSULIN ASPART 100 UNIT/ML IJ SOLN
0.0000 [IU] | Freq: Every day | INTRAMUSCULAR | Status: DC
  Filled 2024-02-29: qty 0.05

## 2024-02-29 MED ORDER — SODIUM BICARBONATE 650 MG PO TABS
650.0000 mg | ORAL_TABLET | Freq: Three times a day (TID) | ORAL | Status: DC
  Administered 2024-02-29 – 2024-03-02 (×6): 650 mg via ORAL
  Filled 2024-02-29 (×8): qty 1

## 2024-02-29 MED ORDER — POLYETHYLENE GLYCOL 3350 17 G PO PACK: 17.0000 g | PACK | Freq: Every day | ORAL | Status: DC | PRN

## 2024-02-29 MED ORDER — PANTOPRAZOLE SODIUM 40 MG PO TBEC
40.0000 mg | DELAYED_RELEASE_TABLET | Freq: Two times a day (BID) | ORAL | Status: DC
  Administered 2024-02-29 – 2024-03-02 (×4): 40 mg via ORAL
  Filled 2024-02-29 (×4): qty 1

## 2024-02-29 MED ORDER — SODIUM CHLORIDE 0.9% FLUSH
3.0000 mL | Freq: Two times a day (BID) | INTRAVENOUS | Status: DC
  Administered 2024-02-29 – 2024-03-02 (×5): 3 mL via INTRAVENOUS

## 2024-02-29 MED ORDER — LEVOTHYROXINE SODIUM 25 MCG PO TABS
25.0000 ug | ORAL_TABLET | Freq: Every day | ORAL | Status: DC
  Administered 2024-03-01 – 2024-03-02 (×2): 25 ug via ORAL
  Filled 2024-02-29 (×2): qty 1

## 2024-02-29 NOTE — Assessment & Plan Note (Addendum)
 This is chrnoic. No report of any bleeding/melena. Dr. Candise Che engaged would like to trnasfuse to > 8. Patinet is agreable. Will order once patient hyperkalemia resolved.

## 2024-02-29 NOTE — ED Provider Notes (Signed)
 [place] EMERGENCY DEPARTMENT AT [facility] Provider Note   CSN: [id number] Arrival date & time: 02/29/24  1036     History  No chief complaint on file.   Cynthia Burns is a 78 y.o. female.  78 year old female with prior medical history as detailed below presents for evaluation.  Patient was at home with her husband.  Her husband was helping her leave the bathroom when she became very weak and apparently either syncopized completely or had near syncope.  Patient was caught by her husband prior to falling.  No injuries reported.  Patient's blood pressure was 90/40 at home.  On arrival blood pressure is 123/55.  Patient is currently receiving chemotherapy.  No recent fever reported.  No cough or congestion reported.  No shortness of breath or chest pain reported.  The history is provided by the patient and medical records.       Home Medications Prior to Admission medications   Medication Sig Start Date End Date Taking? Authorizing Provider  acyclovir (ZOVIRAX) 400 MG tablet Take 1 tablet (400 mg total) by mouth 2 (two) times daily. 02/18/24   Johney Maine, MD  amoxicillin (AMOXIL) 500 MG capsule Take 2,000 mg by mouth See admin instructions. Take 2,000 mg by mouth one hour prior to dental visits    [provider]  Biotin 5000 MCG CAPS Take 5,000 mcg by mouth daily.    [provider]  Blood Glucose Monitoring Suppl (ONE TOUCH ULTRA 2) w/Device KIT CHECK BLOOD SUGAR TWICE DAILY.  DX CODE  E11.9 08/23/19   Zola Button, Grayling Congress, DO  clonazePAM (KLONOPIN) 0.5 MG tablet Take 1 tablet (0.5 mg total) by mouth 2 (two) times daily as needed for anxiety. 02/02/24 02/01/25  [place], Alvira Philips, DO  dexamethasone (DECADRON) 4 MG tablet Take 5 tablets (20 mg) by mouth with breakfast the day after every daratumumab dose 02/18/24   Johney Maine, MD  glucose blood [facility] ULTRA TEST) test strip Use as instructed 09/08/23   Zola Button, Grayling Congress, DO   hydrALAZINE (APRESOLINE) 50 MG tablet Take 1 tablet (50 mg total) by mouth 3 (three) times daily. Patient taking differently: Take 50 mg by mouth See admin instructions. Take 50 mg by mouth with lunch and supper 12/30/23   Wendall Stade, MD  insulin aspart (NOVOLOG FLEXPEN) 100 UNIT/ML FlexPen Per sliding scale max 40 u daily 02/05/24   Zola Button, Grayling Congress, DO  Insulin Pen Needle (PEN NEEDLES) 31G X 6 MM MISC As directed 02/05/24   Zola Button, Myrene Buddy R, DO  Lancets (ONETOUCH DELICA PLUS LANCET33G) MISC USE 1 LANCET TO TEST AS DIRECTED (DISCARD LANCET IN SHARPS CONTAINER IMMEDIATELY AFTER USE) 09/08/23   Zola Button, Grayling Congress, DO  levothyroxine (SYNTHROID) 25 MCG tablet TAKE ONE TABLET BY MOUTH EVERY DAY BEFORE BREAKFAST Patient taking differently: Take 25 mcg by mouth daily before breakfast. 09/08/23   Zola Button, Grayling Congress, DO  Multiple Vitamins-Minerals (PRESERVISION AREDS 2) CAPS Take 1 capsule by mouth daily with lunch.    [provider]  ondansetron (ZOFRAN) 8 MG tablet Take 8 mg by mouth 30 to 60 min prior to Cyclophosphamide administration then take 8 mg every 8 hrs as needed for nausea and vomiting. 02/18/24   Johney Maine, MD  pantoprazole (PROTONIX) 40 MG tablet Pantoprazole 40 mg before breakfast and dinner for 90 days with 3 refills Patient taking differently: Take 40 mg by mouth See admin instructions. Take 40 mg  by mouth 30 minutes before lunch and supper 01/29/24   Napoleon Form, MD  prochlorperazine (COMPAZINE) 10 MG tablet Take 1 tablet (10 mg total) by mouth every 6 (six) hours as needed for nausea or vomiting. 02/18/24   Johney Maine, MD  rosuvastatin (CRESTOR) 20 MG tablet TAKE ONE TABLET BY MOUTH EVERY DAY 02/09/24   Wendall Stade, MD  Semaglutide (RYBELSUS) 7 MG TABS Take 1 tablet (7 mg total) by mouth daily. 02/09/24   Seabron Spates R, DO  valsartan (DIOVAN) 320 MG tablet Take 1 tablet (320 mg total) by mouth daily. 02/02/24   Donato Schultz, DO      Allergies    Mucinex [guaifenesin er], Bystolic [nebivolol hcl], Calcium-containing compounds, Codeine, Paxlovid [nirmatrelvir-ritonavir], Advicor [niacin-lovastatin er], Amlodipine, Lisinopril-hydrochlorothiazide, Other, Ramipril, and Sulfonamide derivatives    Review of Systems   Review of Systems  All other systems reviewed and are negative.   Physical Exam Updated Vital Signs BP (!) 123/55   Pulse 77   Temp 97.9 F (36.6 C) (Oral)   Resp 16   SpO2 99%  Physical Exam Vitals and nursing note reviewed.  Constitutional:      General: She is not in acute distress.    Appearance: Normal appearance. She is well-developed.  HENT:     Head: Normocephalic and atraumatic.  Eyes:     Conjunctiva/sclera: Conjunctivae normal.     Pupils: Pupils are equal, round, and reactive to light.  Cardiovascular:     Rate and Rhythm: Normal rate and regular rhythm.     Heart sounds: Normal heart sounds.  Pulmonary:     Effort: Pulmonary effort is normal. No respiratory distress.     Breath sounds: Normal breath sounds.  Abdominal:     General: There is no distension.     Palpations: Abdomen is soft.     Tenderness: There is no abdominal tenderness.  Musculoskeletal:        General: No deformity. Normal range of motion.     Cervical back: Normal range of motion and neck supple.  Skin:    General: Skin is warm and dry.  Neurological:     General: No focal deficit present.     Mental Status: She is alert and oriented to person, place, and time.     ED Results / Procedures / Treatments   Labs (all labs ordered are listed, but only abnormal results are displayed) Labs Reviewed  RESP PANEL BY RT-PCR (RSV, FLU A&B, COVID)  [id number]  CBC WITH DIFFERENTIAL/PLATELET  COMPREHENSIVE METABOLIC PANEL  URINALYSIS, W/ REFLEX TO CULTURE (INFECTION SUSPECTED)  TROPONIN I (HIGH SENSITIVITY)    EKG None  Radiology No results found.  Procedures Procedures    Medications Ordered  in ED Medications  sodium chloride 0.9 % bolus 500 mL (has no administration in time range)    ED Course/ Medical Decision Making/ A&P                                 Medical Decision Making Amount and/or Complexity of Data Reviewed Labs: ordered. Radiology: ordered.  Risk Prescription drug management.    Medical Screen Complete  This patient presented to the ED with complaint of syncope.  This complaint involves an extensive number of treatment options. The initial differential diagnosis includes, but is not limited to, metabolic abnormality, anemia, AKI, etc.  This presentation is: Acute, Chronic, Self-Limited, Previously  Undiagnosed, Uncertain Prognosis, Complicated, Systemic Symptoms, and Threat to Life/Bodily Function  Patient with known history of multiple myeloma currently on treatment for same presents after near syncope or syncopal event.  Patient with known issues with renal insufficiency and anemia.  Patient is reporting significantly worsening weakness and fatigue over the last week.  Today her symptoms were significant enough that she syncopized while with her husband at home.  Screening labs demonstrated creatinine of 2.17 with a BUN of 116.  Additionally hemoglobin today is 7.3.  COVID and flu testing is negative.  UA is negative.  Troponin is 5.  Patient requires admission for further workup and treatment.  Hospitalist service paged at 2:40 PM.  Per hospitalist dispatch service, hospitalist provider will be paged with this admission request at 3:00PM.   Co morbidities that complicated the patient's evaluation  See HPI   Additional history obtained:  External records from outside sources obtained and reviewed including prior ED visits and prior Inpatient records.    Lab Tests:  I ordered and personally interpreted labs.  The pertinent results include: CBC, CMP, troponin, UA   Imaging Studies ordered:  I ordered imaging studies including chest  x-ray, CT head I independently visualized and interpreted obtained imaging which showed NAD I agree with the radiologist interpretation.   Problem List / ED Course:  Syncope, AKI, anemia   Disposition:  After consideration of the diagnostic results and the patients response to treatment, I feel that the patent would benefit from admission.          Final Clinical Impression(s) / ED Diagnoses Final diagnoses:  Syncope, unspecified syncope type    Rx / DC Orders ED Discharge Orders     None         Wynetta Fines, MD 02/29/24 1443

## 2024-02-29 NOTE — ED Triage Notes (Signed)
 EMS reports from home, husband helping Pt from bathroom and had near syncopal episode. No fall, no injuries. Pt 90/40 at scene. Given bolus enroute last pressure 110/50. Pt had last chemo Tuesday  HR 86 RR 18 Sp02 97 RA CBG 180  20ga Left forearm.

## 2024-02-29 NOTE — Assessment & Plan Note (Addendum)
 In the setting of not eating/dirnking 12 hours, urination. Prompt resolution. Maintain on teleem. Will check echo in AM. Monitor glucose. Orthostatic vitals negative.

## 2024-02-29 NOTE — Assessment & Plan Note (Signed)
 Last onc note from 02/04/2024:  ASSESSMENT & PLAN:  78 y.o. female with:   Newly diagnosed Multiple myeloma - Lambda restricted plasma cell neoplasm comprising greater than 75% of  the cellular marrow PLAN:   -CBC from 02/02/2024 showed WBC of 13.3K, hemoglobin of 8.5, and platelets of 162K.  -there is a role for blood transfusions as needed for anemia -genetics from bone marrow biopsy is pending at this time -educated patient and her family members that genetic testing will provide a general sense of how her condition might behave, though it may not necessary behave in such way. Also discussed that the treatment profile is constantly improving over time -myeloma lab on 01/29/2024 confirms presence of 6.5g of igG lambda protein  -bone marrow biopsy does show Lambda restricted plasma cell neoplasm comprising greater than 75% of  the cellular marrow consistent with active plasma cell myeloma  -educated patient on criteria for differentiating between smoldering vs active myeloma -patient does meet criteria for active myeloma based on CRAB criteria; Patient does have kidney changes and anemia requiring blood transfusions. Her recent whole body x-ray showed no obvious bone tumors, and PET scan is ordered for further evaluation. Patient has no elevated calcium.  -discussed that myeloma is not curable but can be treatable to the point of remission. Discussed that myeloma does tend to recur and typically requires ongoing treatment to control the condition.  -educated patient on details of staging of disease -discussed that there are different phases of treatment, beginning with the induction phase of treatment, which involves a combination of treatment for 4-6 months with the goal of achieving very good partial response of 90% . Discussed goal of decreasing M protein from 6.5 g/dL to <1.3 g/dL with induction treatment.  -discussed that treatment for multiple myeloma would involve a combination of 3-4 targeted  therapies as part of DaraCyBorD regimen, which would be chosen based on their effectiveness and other medical issues that may be limiting.  -Discussed that based on her changes in kidney function and her degree of anemia, we will start with a 3-medication treatment regimen -educated patient on details of daratumumab, Cytoxan, Velcade, and Dexamethasone -wil hold off cytozan until after the first cycle of treatment to ensure that her blood counts are stable.  -discussed that after induction phase of treatment, there will be two options to consider:  Autologous bone marrow transplant - discussed that this aggressive treatment option would involve high-dose chemotherapy. Discussed that this option may be an option if patient does not have significant medical limitations and generally would be an option for patients under 75, though there are exceptions. This option would not be curative, though it may help to extend the duration of remission for longer. Autologous bone marrow transplant would eventually be followed by maintenace treatment.  Proceeding with maintenance treatment directly  -discussed that her treatment does not require port placement since it is not primarily administered via IV, but if needed for convenience, a port may be considered down the line -discussed that there multiple possible reasons for DNA damage, including chemical exposures and increased age -there is no role for radiation therapy at this time -will set up patient for educational counseling session to discuss details of treatment -educated patient and her family members that fluctuations in blood pressure could be due to several possible factors, including anxiety, anemia, fluid status changes, and steroid use.  -advised patient to check her blood pressure first thing in the morning to determine her baseline blood pressure  -  recommend patient to stay hydrated with 3L of liquids daily, which are non-caffeinated,  non-carbonated, and non-alcoholic -if her anemia is not limiting and once patient's lightheadedness resolves, I would recommend walking at least 30 minutes a day -recommend regular daily activities to build muscle strength -continue to eat and drink well  -discussed option of using a low-sugar nutritional supplement to optimize her nutrition while not affecting her blood sugar levels.  -Recommend taking vitamin B12 and D3 supplementation -would not recommend oral iron supplements at this time as it may cause constipation issues. There may be a role for IV iron if needed.  -advised patient to precautionary measures including wearing facial masks while  -will order additional blood tests today for further evaluation -will plan for PET scan -advised patient to connect with her PCP sooner than her currently scheduled appointment of 02/25/2024 to have plan to adjust her DM medications if needed given that she will receive high-dose steroids 1-2x a week as part of her treatment -discussed that is her prolapsed bladder is minor and not significantly bothersome, surgical intervention would not be recommended at this time.  -advised patient to take reasonable precautions including wearing face masks and avoiding large crowds -Patient will have a kidney biopsy on 02/18/2024 -advised patient to connect with her kidney doctor to discuss whether there is a role for an upcoming clinical visit on 02/24/2024 based on kidney biopsy results.  -will connect patient with financial resources to discuss financial/insurance information -answered all of patient's and her family member's questions in detail

## 2024-02-29 NOTE — ED Notes (Signed)
 Pt assisted to the restroom in [facility]. Daughter assisted in same. No needs or complaints identified.

## 2024-02-29 NOTE — Plan of Care (Signed)
  Problem: Education: Goal: Ability to describe self-care measures that may prevent or decrease complications (Diabetes Survival Skills Education) will improve Outcome: Progressing Goal: Individualized Educational Video(s) Outcome: Progressing   Problem: Coping: Goal: Ability to adjust to condition or change in health will improve Outcome: Progressing   Problem: Health Behavior/Discharge Planning: Goal: Ability to manage health-related needs will improve Outcome: Progressing   Problem: Metabolic: Goal: Ability to maintain appropriate glucose levels will improve Outcome: Progressing   Problem: Nutritional: Goal: Maintenance of adequate nutrition will improve Outcome: Progressing Goal: Progress toward achieving an optimal weight will improve Outcome: Progressing   Problem: Skin Integrity: Goal: Risk for impaired skin integrity will decrease Outcome: Progressing   Problem: Tissue Perfusion: Goal: Adequacy of tissue perfusion will improve Outcome: Progressing   Problem: Education: Goal: Knowledge of General Education information will improve Description: Including pain rating scale, medication(s)/side effects and non-pharmacologic comfort measures Outcome: Progressing   Problem: Health Behavior/Discharge Planning: Goal: Ability to manage health-related needs will improve Outcome: Progressing   Problem: Clinical Measurements: Goal: Ability to maintain clinical measurements within normal limits will improve Outcome: Progressing Goal: Diagnostic test results will improve Outcome: Progressing Goal: Respiratory complications will improve Outcome: Progressing Goal: Cardiovascular complication will be avoided Outcome: Progressing   Problem: Nutrition: Goal: Adequate nutrition will be maintained Outcome: Progressing   Problem: Coping: Goal: Level of anxiety will decrease Outcome: Progressing   Problem: Elimination: Goal: Will not experience complications related to  bowel motility Outcome: Progressing Goal: Will not experience complications related to urinary retention Outcome: Progressing   Problem: Pain Managment: Goal: General experience of comfort will improve and/or be controlled Outcome: Progressing   Problem: Safety: Goal: Ability to remain free from injury will improve Outcome: Progressing   Problem: Skin Integrity: Goal: Risk for impaired skin integrity will decrease Outcome: Progressing

## 2024-02-29 NOTE — Assessment & Plan Note (Addendum)
 Slightly worse cr today. Metabolic acidosis with normal/low anion gap. S/p 500 cc fluid in ER. Getting 100 cc/hr now. Trend. Start o PO sodium bicarobonate therapy. Mild hyperkalemia. No peaekd T waves. Lokelma ordered

## 2024-02-29 NOTE — H&P (Signed)
 History and Physical    Patient: Cynthia Burns ZOX:[id number] DOB: 1946/05/29 DOA: 02/29/2024 DOS: the patient was seen and examined on 02/29/2024 PCP: Donato Schultz, DO  Patient coming from: Home  Chief Complaint: No chief complaint on file.  HPI: Cynthia Burns is a 78 y.o. female with medical history significant of mulitple myleoma on PO therpay.     Patinet has had fatigue increasing for last several weeks. Howver, has continued with activities of daily living. Thre is no report of vomiting, dihera, fever, sob, chest pain or palpitaiton. PO hydration/intke is described to be reasonable by family and patinet.   Patinet slept for 12 hours strairght last evneing. Gettting up this AM at about 8 and feeeling pretty tired/fatigued/lightheaded. Subsequently at around 0900 AM patinet was urinating when she was noted by husband (who is curently not at the bedside to interview) to pass out/ lose conciousness. However, second hand report from daughter advises that there was no seizure like activity. Their was no trauma because husband had noted patient to be weak and was keeping a close eye on her and caught the patient .   By all accounts the patinet LOC was transeitn and self limited. However, patient was very weak and was helped to the bedroom and 911 was called.   Patinet got iv fluids in the ER and reports not feeling any more light headed and generally better. She is curently asymptomatic. Medical eval is sought.  Review of Systems: As mentioned in the history of present illness. All other systems reviewed and are negative. 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 She has never used smokeless tobacco. She reports that she does not drink alcohol and does not use drugs.  Allergies  Allergen Reactions   Mucinex [Guaifenesin Er] Anaphylaxis   [employer] Hcl] Other (See Comments)    Lowered B/P markedly   Calcium-Containing Compounds Other (See Comments)    Broke out the mouth inside- tiny bumps   Codeine Nausea And Vomiting   Paxlovid [Nirmatrelvir-Ritonavir] Diarrhea and Nausea And Vomiting   Advicor [Niacin-Lovastatin Er] Rash   Amlodipine Rash   Lisinopril-Hydrochlorothiazide Rash   Other Rash and Other (See Comments)    Eye drops with preservatives   Ramipril Rash   Sulfonamide Derivatives Rash    Family History  Problem Relation Age of Onset   Hypertension Mother    Heart disease Mother        Had a stent   Emphysema Father    Diabetes Father    COPD Father    Stroke Sister    Breast cancer Neg Hx     Prior to Admission medications   Medication Sig Start Date End  Date Taking? Authorizing Provider  acyclovir (ZOVIRAX) 400 MG tablet Take 1 tablet (400 mg total) by mouth 2 (two) times daily. 02/18/24   Johney Maine, MD  amoxicillin (AMOXIL) 500 MG capsule Take 2,000 mg by mouth See admin instructions. Take 2,000 mg by mouth one hour prior to dental visits    [provider]  Biotin 5000 MCG CAPS Take 5,000 mcg by mouth daily.    [provider]  Blood Glucose Monitoring Suppl (ONE TOUCH ULTRA 2) w/Device KIT CHECK BLOOD SUGAR TWICE DAILY.  DX CODE  E11.9 08/23/19   Zola Button, Grayling Congress, DO  clonazePAM (KLONOPIN) 0.5 MG tablet Take 1 tablet (0.5 mg total) by mouth 2 (two) times daily as needed for anxiety. 02/02/24  02/01/25  [place], Alvira Philips, DO  dexamethasone (DECADRON) 4 MG tablet Take 5 tablets (20 mg) by mouth with breakfast the day after every daratumumab dose 02/18/24   Johney Maine, MD  glucose blood [facility] ULTRA TEST) test strip Use as instructed 09/08/23   Zola Button, Grayling Congress, DO  hydrALAZINE (APRESOLINE) 50 MG tablet Take 1 tablet (50 mg total) by mouth 3 (three) times daily. Patient taking differently: Take 50 mg by mouth See admin instructions. Take 50 mg by mouth with lunch and supper 12/30/23   Wendall Stade, MD  insulin aspart (NOVOLOG FLEXPEN) 100 UNIT/ML FlexPen Per sliding scale max 40 u daily 02/05/24   Zola Button, Grayling Congress, DO  Insulin Pen Needle (PEN NEEDLES) 31G X 6 MM MISC As directed 02/05/24   Zola Button, Myrene Buddy R, DO  Lancets (ONETOUCH DELICA PLUS LANCET33G) MISC USE 1 LANCET TO TEST AS DIRECTED (DISCARD LANCET IN SHARPS CONTAINER IMMEDIATELY AFTER USE) 09/08/23   Zola Button, Grayling Congress, DO  levothyroxine (SYNTHROID) 25 MCG tablet TAKE ONE TABLET BY MOUTH EVERY DAY BEFORE BREAKFAST Patient taking differently: Take 25 mcg by mouth daily before breakfast. 09/08/23   Zola Button, Grayling Congress, DO  Multiple Vitamins-Minerals (PRESERVISION AREDS 2) CAPS Take 1 capsule by mouth daily with lunch.    [provider]  ondansetron (ZOFRAN) 8 MG tablet Take 8 mg by mouth 30 to 60 min prior to Cyclophosphamide administration then take 8 mg every 8 hrs as needed for nausea and vomiting. 02/18/24   Johney Maine, MD  pantoprazole (PROTONIX) 40 MG tablet Pantoprazole 40 mg before breakfast and dinner for 90 days with 3 refills Patient taking differently: Take 40 mg by mouth See admin instructions. Take 40 mg by mouth 30 minutes before lunch and supper 01/29/24   Napoleon Form, MD  prochlorperazine (COMPAZINE) 10 MG tablet Take 1 tablet (10 mg total) by mouth every 6 (six) hours as needed for nausea or vomiting. 02/18/24   Johney Maine, MD  rosuvastatin (CRESTOR) 20 MG  tablet TAKE ONE TABLET BY MOUTH EVERY DAY 02/09/24   Wendall Stade, MD  Semaglutide (RYBELSUS) 7 MG TABS Take 1 tablet (7 mg total) by mouth daily. 02/09/24   Seabron Spates R, DO  valsartan (DIOVAN) 320 MG tablet Take 1 tablet (320 mg total) by mouth daily. 02/02/24   Donato Schultz, DO    Physical Exam: Vitals:   02/29/24 1855 02/29/24 1856 02/29/24 1858 02/29/24 1900  BP: (!) 156/76 (!) 170/75 (!) 168/77 (!) 172/73  Pulse: 87 87 89   Resp:  16 16   Temp:      TempSrc:      SpO2: 100% 100% 100%  General - thin lady, no distress. AAOx3. Slightly hard of hearing chronically. Exam: Bilateral intravesicular Cardiovascular exam S1-S2 normal Abdomen all quadrant soft nontender Extremities warm without edema. Data Reviewed:  Labs on Admission:  Results for orders placed or performed during the hospital encounter of 02/29/24 (from the past 24 hours)  CBC with Differential     Status: Abnormal   Collection Time: 02/29/24 11:00 AM  Result Value Ref Range   WBC 7.8 4.0 - 10.5 K/uL   RBC 2.31 (L) 3.87 - 5.11 MIL/uL   Hemoglobin 7.3 (L) 12.0 - 15.0 g/dL   HCT 96.0 (L) 45.4 - 09.8 %   MCV 100.0 80.0 - 100.0 fL   MCH 31.6 26.0 - 34.0 pg   MCHC 31.6 30.0 - 36.0 g/dL   RDW 11.9 (H) 14.7 - 82.9 %   Platelets 205 150 - 400 K/uL   nRBC 0.0 0.0 - 0.2 %   Neutrophils Relative % 65 %   Neutro Abs 5.0 1.7 - 7.7 K/uL   Lymphocytes Relative 21 %   Lymphs Abs 1.7 0.7 - 4.0 K/uL   Monocytes Relative 12 %   Monocytes Absolute 1.0 0.1 - 1.0 K/uL   Eosinophils Relative 2 %   Eosinophils Absolute 0.1 0.0 - 0.5 K/uL   Basophils Relative 0 %   Basophils Absolute 0.0 0.0 - 0.1 K/uL   Immature Granulocytes 0 %   Abs Immature Granulocytes 0.03 0.00 - 0.07 K/uL  Comprehensive metabolic panel     Status: Abnormal   Collection Time: 02/29/24 11:00 AM  Result Value Ref Range   Sodium 131 (L) 135 - 145 mmol/L   Potassium 3.8 3.5 - 5.1 mmol/L   Chloride 111 98 - 111 mmol/L   CO2 16 (L) 22  - 32 mmol/L   Glucose, Bld 138 (H) 70 - 99 mg/dL   BUN 562 (H) 8 - 23 mg/dL   Creatinine, Ser 1.30 (H) 0.44 - 1.00 mg/dL   Calcium 8.4 (L) 8.9 - 10.3 mg/dL   Total Protein 86.5 (H) 6.5 - 8.1 g/dL   Albumin 2.5 (L) 3.5 - 5.0 g/dL   AST 22 15 - 41 U/L   ALT 40 0 - 44 U/L   Alkaline Phosphatase 23 (L) 38 - 126 U/L   Total Bilirubin 0.4 0.0 - 1.2 mg/dL   GFR, Estimated 23 (L) >60 mL/min   Anion gap 4 (L) 5 - 15  Troponin I (High Sensitivity)     Status: None   Collection Time: 02/29/24 11:00 AM  Result Value Ref Range   Troponin I (High Sensitivity) 5 <18 ng/L  Resp panel by RT-PCR (RSV, Flu A&B, Covid) Anterior Nasal Swab     Status: None   Collection Time: 02/29/24 11:00 AM   Specimen: Anterior Nasal Swab  Result Value Ref Range   SARS Coronavirus 2 by RT PCR NEGATIVE NEGATIVE   Influenza A by PCR NEGATIVE NEGATIVE   Influenza B by PCR NEGATIVE NEGATIVE   Resp Syncytial Virus by PCR NEGATIVE NEGATIVE  Urinalysis, w/ Reflex to Culture (Infection Suspected) -Urine, Clean Catch     Status: Abnormal   Collection Time: 02/29/24  1:46 PM  Result Value Ref Range   Specimen Source URINE, CLEAN CATCH    Color, Urine STRAW (A) YELLOW   APPearance CLEAR CLEAR   Specific Gravity, Urine 1.012 1.005 - 1.030   pH 5.0 5.0 - 8.0   Glucose, UA NEGATIVE NEGATIVE mg/dL   Hgb urine dipstick NEGATIVE NEGATIVE  Bilirubin Urine NEGATIVE NEGATIVE   Ketones, ur NEGATIVE NEGATIVE mg/dL   Protein, ur NEGATIVE NEGATIVE mg/dL   Nitrite NEGATIVE NEGATIVE   Leukocytes,Ua NEGATIVE NEGATIVE   RBC / HPF 6-10 0 - 5 RBC/hpf   WBC, UA 0-5 0 - 5 WBC/hpf   Bacteria, UA NONE SEEN NONE SEEN   Squamous Epithelial / HPF 0-5 0 - 5 /HPF   Hyaline Casts, UA PRESENT   Type and screen [facility]     Status: None   Collection Time: 02/29/24  3:50 PM  Result Value Ref Range   ABO/RH(D) A POS    Antibody Screen POS    Sample Expiration 03/03/2024,2359    Antibody Identification NON SPECIFIC  ANTIBODY REACTIVITY    DAT, IgG      NEG Performed at [facility], [address], [place], [place] [address]   Creatinine, serum     Status: Abnormal   Collection Time: 02/29/24  3:50 PM  Result Value Ref Range   Creatinine, Ser 1.06 (H) 0.44 - 1.00 mg/dL   GFR, Estimated 54 (L) >60 mL/min  Glucose, capillary     Status: None   Collection Time: 02/29/24  4:35 PM  Result Value Ref Range   Glucose-Capillary 94 70 - 99 mg/dL  Troponin I (High Sensitivity)     Status: None   Collection Time: 02/29/24  4:55 PM  Result Value Ref Range   Troponin I (High Sensitivity) 7 <18 ng/L  CBC     Status: Abnormal   Collection Time: 02/29/24  4:55 PM  Result Value Ref Range   WBC 8.8 4.0 - 10.5 K/uL   RBC 2.41 (L) 3.87 - 5.11 MIL/uL   Hemoglobin 7.6 (L) 12.0 - 15.0 g/dL   HCT 82.9 (L) 56.2 - 13.0 %   MCV 102.1 (H) 80.0 - 100.0 fL   MCH 31.5 26.0 - 34.0 pg   MCHC 30.9 30.0 - 36.0 g/dL   RDW 86.5 (H) 78.4 - 69.6 %   Platelets 201 150 - 400 K/uL   nRBC 0.0 0.0 - 0.2 %  Basic metabolic panel     Status: Abnormal   Collection Time: 02/29/24  4:55 PM  Result Value Ref Range   Sodium 130 (L) 135 - 145 mmol/L   Potassium 5.2 (H) 3.5 - 5.1 mmol/L   Chloride 112 (H) 98 - 111 mmol/L   CO2 16 (L) 22 - 32 mmol/L   Glucose, Bld 114 (H) 70 - 99 mg/dL   BUN 295 (H) 8 - 23 mg/dL   Creatinine, Ser 2.84 (H) 0.44 - 1.00 mg/dL   Calcium 8.4 (L) 8.9 - 10.3 mg/dL   GFR, Estimated 30 (L) >60 mL/min   Anion gap 2 (L) 5 - 15   Basic Metabolic Panel: Recent Labs  Lab 02/29/24 1100 02/29/24 1550 02/29/24 1655  NA 131*  --  130*  K 3.8  --  5.2*  CL 111  --  112*  CO2 16*  --  16*  GLUCOSE 138*  --  114*  BUN 116*  --  103*  CREATININE 2.17* 1.06* 1.75*  CALCIUM 8.4*  --  8.4*   Liver Function Tests: Recent Labs  Lab 02/29/24 1100  AST 22  ALT 40  ALKPHOS 23*  BILITOT 0.4  PROT 11.4*  ALBUMIN 2.5*   No results for input(s): "LIPASE", "AMYLASE" in the last 168 hours. No  results for input(s): "AMMONIA" in the last 168 hours. CBC: Recent Labs  Lab 02/29/24 1100 02/29/24 1655  WBC 7.8 8.8  NEUTROABS 5.0  --   HGB 7.3* 7.6*  HCT 23.1* 24.6*  MCV 100.0 102.1*  PLT 205 201   Cardiac Enzymes: Recent Labs  Lab 02/29/24 1100 02/29/24 1655  TROPONINIHS 5 7    BNP (last 3 results) No results for input(s): "PROBNP" in the last 8760 hours. CBG: Recent Labs  Lab 02/29/24 1635  GLUCAP 94    Radiological Exams on Admission:  DG Chest Port 1 View Result Date: 02/29/2024 CLINICAL DATA:  Shortness of breath. EXAM: PORTABLE CHEST 1 VIEW COMPARISON:  None Available. FINDINGS: Low lung volume. Bilateral lung fields are clear. No dense consolidation or lung collapse. No frank pulmonary edema. Bilateral costophrenic angles are clear. There is elevated right hemidiaphragm. Normal cardio-mediastinal silhouette. No acute osseous abnormalities. The soft tissues are within normal limits. IMPRESSION: No active disease. Electronically Signed   By: Jules Schick M.D.   On: 02/29/2024 13:59   CT Head Wo Contrast Result Date: 02/29/2024 CLINICAL DATA:  Syncope/presyncope EXAM: CT HEAD WITHOUT CONTRAST TECHNIQUE: Contiguous axial images were obtained from the base of the skull through the vertex without intravenous contrast. RADIATION DOSE REDUCTION: This exam was performed according to the departmental dose-optimization program which includes automated exposure control, adjustment of the mA and/or kV according to patient size and/or use of iterative reconstruction technique. COMPARISON:  None Available. FINDINGS: Brain: No evidence of accelerated brain volume loss. No sign of old or acute focal infarction, mass lesion, hemorrhage, hydrocephalus or extra-axial collection. Vascular: Very minimal atherosclerotic calcification of the major vessels at the base of the brain. Skull: Negative Sinuses/Orbits: Clear/normal Other: None IMPRESSION: No acute or traumatic finding. Very  minimal atherosclerotic calcification of the major vessels at the base of the brain. Electronically Signed   By: Paulina Fusi M.D.   On: 02/29/2024 11:34    chest X-ray  EKG: Independently reviewed. NSR, no peaked T waves  I/O last 3 completed shifts: In: 144.7 [I.V.:144.7] Out: -  No intake/output data recorded.       Assessment and Plan: Syncope In the setting of not eating/dirnking 12 hours, urination. Prompt resolution. Maintain on teleem. Will check echo in AM. Monitor glucose. Orthostatic vitals negative.  CKD (chronic kidney disease) stage 4, GFR 15-29 ml/min (HCC) Slightly worse cr today. Metabolic acidosis with normal/low anion gap. S/p 500 cc fluid in ER. Getting 100 cc/hr now. Trend. Start o PO sodium bicarobonate therapy. Mild hyperkalemia. No peaekd T waves. Lokelma ordered  Multiple myeloma (HCC) Last onc note from 02/04/2024:  ASSESSMENT & PLAN:  78 y.o. female with:   Newly diagnosed Multiple myeloma - Lambda restricted plasma cell neoplasm comprising greater than 75% of  the cellular marrow PLAN:   -CBC from 02/02/2024 showed WBC of 13.3K, hemoglobin of 8.5, and platelets of 162K.  -there is a role for blood transfusions as needed for anemia -genetics from bone marrow biopsy is pending at this time -educated patient and her family members that genetic testing will provide a general sense of how her condition might behave, though it may not necessary behave in such way. Also discussed that the treatment profile is constantly improving over time -myeloma lab on 01/29/2024 confirms presence of 6.5g of igG lambda protein  -bone marrow biopsy does show Lambda restricted plasma cell neoplasm comprising greater than 75% of  the cellular marrow consistent with active plasma cell myeloma  -educated patient on criteria for differentiating between smoldering vs active myeloma -patient does  meet criteria for active myeloma based on CRAB criteria; Patient does have kidney  changes and anemia requiring blood transfusions. Her recent whole body x-ray showed no obvious bone tumors, and PET scan is ordered for further evaluation. Patient has no elevated calcium.  -discussed that myeloma is not curable but can be treatable to the point of remission. Discussed that myeloma does tend to recur and typically requires ongoing treatment to control the condition.  -educated patient on details of staging of disease -discussed that there are different phases of treatment, beginning with the induction phase of treatment, which involves a combination of treatment for 4-6 months with the goal of achieving very good partial response of 90% . Discussed goal of decreasing M protein from 6.5 g/dL to <5.4 g/dL with induction treatment.  -discussed that treatment for multiple myeloma would involve a combination of 3-4 targeted therapies as part of DaraCyBorD regimen, which would be chosen based on their effectiveness and other medical issues that may be limiting.  -Discussed that based on her changes in kidney function and her degree of anemia, we will start with a 3-medication treatment regimen -educated patient on details of daratumumab, Cytoxan, Velcade, and Dexamethasone -wil hold off cytozan until after the first cycle of treatment to ensure that her blood counts are stable.  -discussed that after induction phase of treatment, there will be two options to consider:  Autologous bone marrow transplant - discussed that this aggressive treatment option would involve high-dose chemotherapy. Discussed that this option may be an option if patient does not have significant medical limitations and generally would be an option for patients under 75, though there are exceptions. This option would not be curative, though it may help to extend the duration of remission for longer. Autologous bone marrow transplant would eventually be followed by maintenace treatment.  Proceeding with maintenance treatment  directly  -discussed that her treatment does not require port placement since it is not primarily administered via IV, but if needed for convenience, a port may be considered down the line -discussed that there multiple possible reasons for DNA damage, including chemical exposures and increased age -there is no role for radiation therapy at this time -will set up patient for educational counseling session to discuss details of treatment -educated patient and her family members that fluctuations in blood pressure could be due to several possible factors, including anxiety, anemia, fluid status changes, and steroid use.  -advised patient to check her blood pressure first thing in the morning to determine her baseline blood pressure  -recommend patient to stay hydrated with 3L of liquids daily, which are non-caffeinated, non-carbonated, and non-alcoholic -if her anemia is not limiting and once patient's lightheadedness resolves, I would recommend walking at least 30 minutes a day -recommend regular daily activities to build muscle strength -continue to eat and drink well  -discussed option of using a low-sugar nutritional supplement to optimize her nutrition while not affecting her blood sugar levels.  -Recommend taking vitamin B12 and D3 supplementation -would not recommend oral iron supplements at this time as it may cause constipation issues. There may be a role for IV iron if needed.  -advised patient to precautionary measures including wearing facial masks while  -will order additional blood tests today for further evaluation -will plan for PET scan -advised patient to connect with her PCP sooner than her currently scheduled appointment of 02/25/2024 to have plan to adjust her DM medications if needed given that she will receive high-dose steroids 1-2x a  week as part of her treatment -discussed that is her prolapsed bladder is minor and not significantly bothersome, surgical intervention would not  be recommended at this time.  -advised patient to take reasonable precautions including wearing face masks and avoiding large crowds -Patient will have a kidney biopsy on 02/18/2024 -advised patient to connect with her kidney doctor to discuss whether there is a role for an upcoming clinical visit on 02/24/2024 based on kidney biopsy results.  -will connect patient with financial resources to discuss financial/insurance information -answered all of patient's and her family member's questions in detail    Anemia This is chrnoic. Dr. Candise Che engaged would like to trnasfuse to > 8. Patinet is agreable. Will order once patient hyperkalemia resolved.      Advance Care Planning:   Code Status: Full Code   Consults: onc engaged.  Family Communication: daughter at bedside. All questions answered.  Severity of Illness: The appropriate patient status for this patient is OBSERVATION. Observation status is judged to be reasonable and necessary in order to provide the required intensity of service to ensure the patient's safety. The patient's presenting symptoms, physical exam findings, and initial radiographic and laboratory data in the context of their medical condition is felt to place them at decreased risk for further clinical deterioration. Furthermore, it is anticipated that the patient will be medically stable for discharge from the hospital within 2 midnights of admission.   Author: Nolberto Hanlon, MD 02/29/2024 8:15 PM  For on call review [email or web address].

## 2024-03-01 ENCOUNTER — Ambulatory Visit: Payer: Medicare Other | Admitting: Hematology

## 2024-03-01 ENCOUNTER — Inpatient Hospital Stay (HOSPITAL_COMMUNITY)

## 2024-03-01 ENCOUNTER — Ambulatory Visit: Payer: Medicare Other

## 2024-03-01 ENCOUNTER — Other Ambulatory Visit: Payer: Medicare Other

## 2024-03-01 ENCOUNTER — Encounter: Payer: Self-pay | Admitting: Nephrology

## 2024-03-01 DIAGNOSIS — R55 Syncope and collapse: Secondary | ICD-10-CM

## 2024-03-01 DIAGNOSIS — C9000 Multiple myeloma not having achieved remission: Secondary | ICD-10-CM | POA: Diagnosis not present

## 2024-03-01 DIAGNOSIS — D649 Anemia, unspecified: Secondary | ICD-10-CM | POA: Diagnosis not present

## 2024-03-01 LAB — GLUCOSE, CAPILLARY
Glucose-Capillary: 160 mg/dL — ABNORMAL HIGH (ref 70–99)
Glucose-Capillary: 97 mg/dL (ref 70–99)
Glucose-Capillary: 111 mg/dL — ABNORMAL HIGH (ref 70–99)
Glucose-Capillary: 104 mg/dL — ABNORMAL HIGH (ref 70–99)

## 2024-03-01 LAB — ECHOCARDIOGRAM COMPLETE
AR max vel: 1.6 cm2
AV Area VTI: 1.95 cm2
AV Area mean vel: 1.63 cm2
AV Mean grad: 4 mmHg
AV Peak grad: 6.7 mmHg
Ao pk vel: 1.29 m/s
Area-P 1/2: 3.42 cm2
Calc EF: 68.3 %
MV VTI: 1.41 cm2
S' Lateral: 2.6 cm
Single Plane A2C EF: 68.5 %
Single Plane A4C EF: 68.9 %

## 2024-03-01 LAB — BASIC METABOLIC PANEL
Anion gap: 1 — ABNORMAL LOW (ref 5–15)
BUN: 86 mg/dL — ABNORMAL HIGH (ref 8–23)
CO2: 17 mmol/L — ABNORMAL LOW (ref 22–32)
Calcium: 8.2 mg/dL — ABNORMAL LOW (ref 8.9–10.3)
Chloride: 112 mmol/L — ABNORMAL HIGH (ref 98–111)
Creatinine, Ser: 1.53 mg/dL — ABNORMAL HIGH (ref 0.44–1.00)
GFR, Estimated: 35 mL/min — ABNORMAL LOW (ref >60)
Glucose, Bld: 109 mg/dL — ABNORMAL HIGH (ref 70–99)
Potassium: 3.7 mmol/L (ref 3.5–5.1)
Sodium: 130 mmol/L — ABNORMAL LOW (ref 135–145)

## 2024-03-01 LAB — CBC
HCT: 23 % — ABNORMAL LOW (ref 36.0–46.0)
Hemoglobin: 7.4 g/dL — ABNORMAL LOW (ref 12.0–15.0)
MCH: 31.9 pg (ref 26.0–34.0)
MCHC: 32.2 g/dL (ref 30.0–36.0)
MCV: 99.1 fL (ref 80.0–100.0)
Platelets: 196 10*3/uL (ref 150–400)
RBC: 2.32 MIL/uL — ABNORMAL LOW (ref 3.87–5.11)
RDW: 16.7 % — ABNORMAL HIGH (ref 11.5–15.5)
WBC: 6.3 10*3/uL (ref 4.0–10.5)
nRBC: 0 % (ref 0.0–0.2)

## 2024-03-01 LAB — PROTIME-INR
INR: 1.3 — ABNORMAL HIGH (ref 0.8–1.2)
Prothrombin Time: 16.2 s — ABNORMAL HIGH (ref 11.4–15.2)

## 2024-03-01 LAB — APTT: aPTT: 27 s (ref 24–36)

## 2024-03-01 LAB — PREPARE RBC (CROSSMATCH)

## 2024-03-01 MED ORDER — SODIUM CHLORIDE 0.9% IV SOLUTION: Freq: Once | INTRAVENOUS | Status: AC

## 2024-03-01 NOTE — Progress Notes (Signed)
 PROGRESS NOTE    Cynthia Burns  OZH:[id number] DOB: Jan 29, 1946 DOA: 02/29/2024 PCP: Donato Schultz, DO  No chief complaint on file.   Brief Narrative:   Cynthia Burns is Cynthia Burns 78 y.o. female with medical history significant of mulitple myleoma.  Had chemo 3/4 and did well immediately after, but was exhausted over the weekend and on the day of admission had episode of syncope after using the bathroom.  Her blood pressure that morning notably in the 90's.  Typically in the 100's.  They note good appetite and PO intake that weekend, just that she was "wiped out" prior to her syncopal episode.   See H&P for additional details   Assessment & Plan:   Active Problems:   Anemia   Multiple myeloma (HCC)   CKD (chronic kidney disease) stage 4, GFR 15-29 ml/min (HCC)   Syncope  Syncope Occurred weekend after chemo in setting of hypotension and after she used bathroom.  Suspect blood pressure related.  Possible micturition syncope.  Antihypertensives contributed. Orthostatics negative Continue tele BP on higher side right now, will continue to hold antihypertensives given her hypotension (SBP 90's) prior to her syncopal episode Echo with preserved EF - no AVS.   Transfusing 1 unit pRBC today to keep hb >8 per hematology At discharge, would cautiously resume BP meds at Montario Zilka lower dose or 1 at Khamani Daniely time (vs potentially holding them)  AKI on CKD IV Follow while holding ARB and giving blood Suspect she was hypovolemic given hypotension, though she and her husband not noticed poor PO intake Close to baseline today Follows with nephrology outpatient   Myeloma Per heme/onc  Started chemo on 2/13.  Last treatment 3/4.  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 Left ventricular ejection fraction, by estimation, is 60 to 65%. The  left ventricle has normal function. The left ventricle has no regional  wall motion abnormalities. Left ventricular diastolic parameters are  consistent with Grade I diastolic  dysfunction (impaired relaxation).   2. Right ventricular systolic function is normal. The right ventricular  size is normal. Tricuspid regurgitation signal is inadequate for assessing  PA pressure.   3. The mitral valve is normal in structure. No evidence of mitral valve  regurgitation. No evidence of mitral stenosis.   4. The aortic valve is tricuspid. Aortic valve regurgitation is trivial.  No aortic stenosis is present.   5. Aortic dilatation noted. There is mild dilatation of the aortic root,  measuring 39 mm.   6. The inferior vena cava is normal in size with greater than 50%  respiratory variability, suggesting right atrial pressure of 3 mmHg.   Antimicrobials:  Anti-infectives (From admission, onward)    None       Subjective: No complaints Husband at bedside  Objective: Vitals:   03/01/24 1040 03/01/24 1054 03/01/24 1055 03/01/24 1454  BP: (!) 159/75 (!) 165/95 (!) 165/95 (!) 166/107  Pulse: 83 76 99 79  Resp: 16 16 16 19   Temp: 97.8 F (36.6 C)  97.7 F (36.5 C) 98 F (36.7 C)  TempSrc: Oral  Oral Oral  SpO2:  100%  99%    Intake/Output Summary (Last 24 hours) at 03/01/2024 1719 Last  data filed at 02/29/2024 1800 Gross per 24 hour  Intake 144.74 ml  Output --  Net 144.74 ml   There were no vitals filed for this visit.  Examination:  General exam: Appears calm and comfortable  Respiratory system: unlabored Cardiovascular system: RRR Gastrointestinal system: Abdomen is nondistended, soft and nontender.  Central nervous system: Alert and oriented. No focal neurological deficits. Extremities: no LEE    Data Reviewed: I have personally  reviewed following labs and imaging studies  CBC: Recent Labs  Lab 02/29/24 1100 02/29/24 1655 03/01/24 0531  WBC 7.8 8.8 6.3  NEUTROABS 5.0  --   --   HGB 7.3* 7.6* 7.4*  HCT 23.1* 24.6* 23.0*  MCV 100.0 102.1* 99.1  PLT 205 201 196    Basic Metabolic Panel: Recent Labs  Lab 02/29/24 1100 02/29/24 1550 02/29/24 1655 03/01/24 0531  NA 131*  --  130* 130*  K 3.8  --  5.2* 3.7  CL 111  --  112* 112*  CO2 16*  --  16* 17*  GLUCOSE 138*  --  114* 109*  BUN 116*  --  103* 86*  CREATININE 2.17* 1.06* 1.75* 1.53*  CALCIUM 8.4*  --  8.4* 8.2*    GFR: CrCl cannot be calculated (Unknown ideal weight.).  Liver Function Tests: Recent Labs  Lab 02/29/24 1100  AST 22  ALT 40  ALKPHOS 23*  BILITOT 0.4  PROT 11.4*  ALBUMIN 2.5*    CBG: Recent Labs  Lab 02/29/24 1635 02/29/24 2047 03/01/24 0844 03/01/24 1204  GLUCAP 94 122* 160* 97     Recent Results (from the past 240 hours)  Resp panel by RT-PCR (RSV, Flu Cynthia Burns&B, Covid) Anterior Nasal Swab     Status: None   Collection Time: 02/29/24 11:00 AM   Specimen: Anterior Nasal Swab  Result Value Ref Range Status   SARS Coronavirus 2 by RT PCR NEGATIVE NEGATIVE Final    Comment: (NOTE) SARS-CoV-2 target nucleic acids are NOT DETECTED.  The SARS-CoV-2 RNA is generally detectable in upper respiratory specimens during the acute phase of infection. The lowest concentration of SARS-CoV-2 viral copies this assay can detect is 138 copies/mL. Cynthia Burns negative result does not preclude SARS-Cov-2 infection and should not be used as the sole basis for treatment or other patient management decisions. Cynthia Burns negative result may occur with  improper specimen collection/handling, submission of specimen other than nasopharyngeal swab, presence of viral mutation(s) within the areas targeted by this assay, and inadequate number of viral copies(<138 copies/mL). Cynthia Burns negative result must be combined with clinical observations, patient history, and  epidemiological information. The expected result is Negative.  Fact Sheet for Patients:  BloggerCourse.com  Fact Sheet for Healthcare Providers:  SeriousBroker.it  This test is no t yet approved or cleared by the [place] FDA and  has been authorized for detection and/or diagnosis of SARS-CoV-2 by FDA under an Emergency Use Authorization (EUA). This EUA will remain  in effect (meaning this test can be used) for the duration of the COVID-19 declaration under Section 564(b)(1) of the Act, 21 U.S.C.section 360bbb-3(b)(1), unless the authorization is terminated  or revoked sooner.       Influenza Lanette Ell by PCR NEGATIVE NEGATIVE Final   Influenza B by PCR NEGATIVE NEGATIVE Final    Comment: (NOTE) The Xpert Xpress SARS-CoV-2/FLU/RSV plus assay is intended as an aid in the diagnosis of influenza from Nasopharyngeal swab specimens and should not be used as Cynthia Burns sole basis for treatment. Nasal washings and aspirates  are unacceptable for Xpert Xpress SARS-CoV-2/FLU/RSV testing.  Fact Sheet for Patients: BloggerCourse.com  Fact Sheet for Healthcare Providers: SeriousBroker.it  This test is not yet approved or cleared by the [place] FDA and has been authorized for detection and/or diagnosis of SARS-CoV-2 by FDA under an Emergency Use Authorization (EUA). This EUA will remain in effect (meaning this test can be used) for the duration of the COVID-19 declaration under Section 564(b)(1) of the Act, 21 U.S.C. section 360bbb-3(b)(1), unless the authorization is terminated or revoked.     Resp Syncytial Virus by PCR NEGATIVE NEGATIVE Final    Comment: (NOTE) Fact Sheet for Patients: BloggerCourse.com  Fact Sheet for Healthcare Providers: SeriousBroker.it  This test is not yet approved or cleared by the [place] FDA and has been  authorized for detection and/or diagnosis of SARS-CoV-2 by FDA under an Emergency Use Authorization (EUA). This EUA will remain in effect (meaning this test can be used) for the duration of the COVID-19 declaration under Section 564(b)(1) of the Act, 21 U.S.C. section 360bbb-3(b)(1), unless the authorization is terminated or revoked.  Performed at [facility], [address] [place], [place] [address]   Culture, blood (Routine X 2) w Reflex to ID Panel     Status: None (Preliminary result)   Collection Time: 02/29/24  9:07 PM   Specimen: BLOOD LEFT ARM  Result Value Ref Range Status   Specimen Description   Final    BLOOD LEFT ARM Performed at [facility], [address], [place], [place] [address]    Special Requests   Final    BOTTLES DRAWN AEROBIC AND ANAEROBIC Blood Culture results may not be optimal due to an inadequate volume of blood received in culture bottles Performed at [facility], [address] [place], [place] [address]    Culture   Final    NO GROWTH < 12 HOURS Performed at [facility], [address], [place], [place] [address]    Report Status PENDING  Incomplete  Culture, blood (Routine X 2) w Reflex to ID Panel     Status: None (Preliminary result)   Collection Time: 02/29/24  9:07 PM   Specimen: BLOOD LEFT HAND  Result Value Ref Range Status   Specimen Description   Final    BLOOD LEFT HAND Performed at [facility], [address] [place], [place] [address]    Special Requests   Final    BOTTLES DRAWN AEROBIC AND ANAEROBIC Blood Culture results may not be optimal due to an inadequate volume of blood received in culture bottles Performed at [facility], [address], [place], [place] [address]    Culture   Final    NO GROWTH < 12 HOURS Performed at [facility], [address] [place], [place] [address]    Report Status PENDING   Incomplete         Radiology Studies: ECHOCARDIOGRAM COMPLETE Result Date: 03/01/2024    ECHOCARDIOGRAM REPORT   Patient Name:   Cynthia Burns Date of Exam: 03/01/2024 Medical Rec #:  [id number]       Height:       61.0 in Accession #:    [id number]      Weight:       116.2 lb Date of Birth:  Jun 18, 1946      BSA:          1.500 m Patient Age:    46 years  BP:           165/95 mmHg Patient Gender: F               HR:           79 bpm. Exam Location:  Inpatient Procedure: 2D Echo, Cardiac Doppler and Color Doppler (Both Spectral and Color            Flow Doppler were utilized during procedure). Indications:    Syncope  History:        Patient has prior history of Echocardiogram examinations, most                 recent 11/24/2018. Risk Factors:Hypertension, Diabetes and                 Dyslipidemia. CKD.  Sonographer:    Vern Claude Referring Phys: [id number] HERSH GOEL IMPRESSIONS  1. Left ventricular ejection fraction, by estimation, is 60 to 65%. The left ventricle has normal function. The left ventricle has no regional wall motion abnormalities. Left ventricular diastolic parameters are consistent with Grade I diastolic dysfunction (impaired relaxation).  2. Right ventricular systolic function is normal. The right ventricular size is normal. Tricuspid regurgitation signal is inadequate for assessing PA pressure.  3. The mitral valve is normal in structure. No evidence of mitral valve regurgitation. No evidence of mitral stenosis.  4. The aortic valve is tricuspid. Aortic valve regurgitation is trivial. No aortic stenosis is present.  5. Aortic dilatation noted. There is mild dilatation of the aortic root, measuring 39 mm.  6. The inferior vena cava is normal in size with greater than 50% respiratory variability, suggesting right atrial pressure of 3 mmHg. FINDINGS  Left Ventricle: Left ventricular ejection fraction, by estimation, is 60 to 65%. The left ventricle has normal function. The left  ventricle has no regional wall motion abnormalities. The left ventricular internal cavity size was normal in size. There is  no left ventricular hypertrophy. Left ventricular diastolic parameters are consistent with Grade I diastolic dysfunction (impaired relaxation). Right Ventricle: The right ventricular size is normal. No increase in right ventricular wall thickness. Right ventricular systolic function is normal. Tricuspid regurgitation signal is inadequate for assessing PA pressure. Left Atrium: Left atrial size was normal in size. Right Atrium: Right atrial size was normal in size. Pericardium: There is no evidence of pericardial effusion. Mitral Valve: The mitral valve is normal in structure. No evidence of mitral valve regurgitation. No evidence of mitral valve stenosis. MV peak gradient, 10.6 mmHg. The mean mitral valve gradient is 5.0 mmHg. Tricuspid Valve: The tricuspid valve is normal in structure. Tricuspid valve regurgitation is not demonstrated. Aortic Valve: The aortic valve is tricuspid. Aortic valve regurgitation is trivial. No aortic stenosis is present. Aortic valve mean gradient measures 4.0 mmHg. Aortic valve peak gradient measures 6.7 mmHg. Aortic valve area, by VTI measures 1.95 cm. Pulmonic Valve: The pulmonic valve was normal in structure. Pulmonic valve regurgitation is not visualized. Aorta: Aortic dilatation noted. There is mild dilatation of the aortic root, measuring 39 mm. Venous: The inferior vena cava is normal in size with greater than 50% respiratory variability, suggesting right atrial pressure of 3 mmHg. IAS/Shunts: No atrial level shunt detected by color flow Doppler.  LEFT VENTRICLE PLAX 2D LVIDd:         4.00 cm      Diastology LVIDs:         2.60 cm      LV e' medial:  6.31 cm/s LV PW:         0.70 cm      LV E/e' medial:  10.6 LV IVS:        0.90 cm      LV e' lateral:   8.70 cm/s LVOT diam:     1.70 cm      LV E/e' lateral: 7.7 LV SV:         47 LV SV Index:   32 LVOT  Area:     2.27 cm  LV Volumes (MOD) LV vol d, MOD A2C: 110.0 ml LV vol d, MOD A4C: 117.0 ml LV vol s, MOD A2C: 34.6 ml LV vol s, MOD A4C: 36.4 ml LV SV MOD A2C:     75.4 ml LV SV MOD A4C:     117.0 ml LV SV MOD BP:      78.3 ml RIGHT VENTRICLE             IVC RV Basal diam:  2.60 cm     IVC diam: 1.20 cm RV Mid diam:    1.60 cm RV S prime:     10.20 cm/s TAPSE (M-mode): 1.8 cm LEFT ATRIUM             Index        RIGHT ATRIUM          Index LA diam:        2.90 cm 1.93 cm/m   RA Area:     5.86 cm LA Vol (A2C):   34.4 ml 22.94 ml/m  RA Volume:   6.04 ml  4.03 ml/m LA Vol (A4C):   27.4 ml 18.27 ml/m LA Biplane Vol: 32.6 ml 21.74 ml/m  AORTIC VALVE                    PULMONIC VALVE AV Area (Vmax):    1.60 cm     PV Vmax:       0.90 m/s AV Area (Vmean):   1.63 cm     PV Peak grad:  3.3 mmHg AV Area (VTI):     1.95 cm AV Vmax:           129.00 cm/s AV Vmean:          88.300 cm/s AV VTI:            0.243 m AV Peak Grad:      6.7 mmHg AV Mean Grad:      4.0 mmHg LVOT Vmax:         90.70 cm/s LVOT Vmean:        63.300 cm/s LVOT VTI:          0.209 m LVOT/AV VTI ratio: 0.86  AORTA Ao Root diam: 3.90 cm MITRAL VALVE MV Area (PHT): 3.42 cm     SHUNTS MV Area VTI:   1.41 cm     Systemic VTI:  0.21 m MV Peak grad:  10.6 mmHg    Systemic Diam: 1.70 cm MV Mean grad:  5.0 mmHg MV Vmax:       1.63 m/s MV Vmean:      102.0 cm/s MV Decel Time: 222 msec MV E velocity: 66.70 cm/s MV Lakin Romer velocity: 112.00 cm/s MV E/Briannon Boggio ratio:  0.60 Dalton McleanMD Electronically signed by Wilfred Lacy Signature Date/Time: 03/01/2024/2:46:46 PM    Final    DG Chest Port 1 View Result Date: 02/29/2024 CLINICAL DATA:  Shortness of breath. EXAM: PORTABLE CHEST 1 VIEW COMPARISON:  None Available. FINDINGS: Low lung volume. Bilateral lung fields are clear. No dense consolidation or lung collapse. No frank pulmonary edema. Bilateral costophrenic angles are clear. There is elevated right hemidiaphragm. Normal cardio-mediastinal silhouette. No acute  osseous abnormalities. The soft tissues are within normal limits. IMPRESSION: No active disease. Electronically Signed   By: Jules Schick M.D.   On: 02/29/2024 13:59   CT Head Wo Contrast Result Date: 02/29/2024 CLINICAL DATA:  Syncope/presyncope EXAM: CT HEAD WITHOUT CONTRAST TECHNIQUE: Contiguous axial images were obtained from the base of the skull through the vertex without intravenous contrast. RADIATION DOSE REDUCTION: This exam was performed according to the departmental dose-optimization program which includes automated exposure control, adjustment of the mA and/or kV according to patient size and/or use of iterative reconstruction technique. COMPARISON:  None Available. FINDINGS: Brain: No evidence of accelerated brain volume loss. No sign of old or acute focal infarction, mass lesion, hemorrhage, hydrocephalus or extra-axial collection. Vascular: Very minimal atherosclerotic calcification of the major vessels at the base of the brain. Skull: Negative Sinuses/Orbits: Clear/normal Other: None IMPRESSION: No acute or traumatic finding. Very minimal atherosclerotic calcification of the major vessels at the base of the brain. Electronically Signed   By: Paulina Fusi M.D.   On: 02/29/2024 11:34        Scheduled Meds:  heparin  5,000 Units Subcutaneous Q8H   insulin aspart  0-5 Units Subcutaneous QHS   insulin aspart  0-9 Units Subcutaneous TID WC   levothyroxine  25 mcg Oral Q0600   pantoprazole  40 mg Oral BID   sodium bicarbonate  650 mg Oral TID   sodium chloride flush  3 mL Intravenous Q12H   Continuous Infusions:   LOS: 1 day    Time spent: over 30 min    Lacretia Nicks, MD [facility]   To contact the attending provider between 7A-7P or the covering provider during after hours 7P-7A, please log into the web site [email or web address] and access using universal [place] password for that web site. If you do not have the password, please call the hospital  operator.  03/01/2024, 5:19 PM

## 2024-03-01 NOTE — Progress Notes (Signed)
 Patient refuse her heparin injection , MD aware of it.

## 2024-03-01 NOTE — Plan of Care (Signed)

## 2024-03-01 NOTE — Progress Notes (Cosign Needed)
 Cynthia Burns   DOB:11/18/1946   AO#:[id number]      ASSESSMENT & PLAN:   Fatigue/generalized weakness - Patient admitted 02/29/2024 with chief complaint of increasing fatigue for several weeks. -Likely due to new diagnosis of multiple myeloma - Continue IV fluids - Encourage out of bed activity - Continue supportive care  Multiple myeloma - Diagnosed February 2025 - Outside lab done had Dr. Doristine Church office showed M spike 6.1 with kappa light chains in the 400s. - Initially seen in outpatient oncology on 01/30/2024.  Multiple myeloma confirmed via path. - Started on daratumumab, cyclophosphamide, Velcade and dexamethasone q. 28 days, on 02/04/2024 received cycle 1. - Patient instructed to follow-up with Dr. Thedore Mins regarding kidney biopsy. - Medical oncology/Dr. Candise Che following  Renal insufficiency - Worsening -Elevated creatinine in the mid 1 range.  BUN has been steadily increasing from 33 one month ago, currently 77. - Likely due to myeloma - Continue to monitor CMP  Anemia, normocytic -Hemoglobin 7.4 today.  PRBC transfusion currently ongoing. - Baseline hemoglobin seems to be in the 8-9 range - Likely due to multiple myeloma and renal dysfunction - Recommend PRBC transfusion for Hgb <8 - Continue to monitor CBC with differential  Anxiety - On clonazepam - Continue to monitor  Hypertension Hyperlipidemia Diabetes - Monitor blood pressure - Monitor blood sugar levels - Medicine following   Code Status Full  Subjective:  Patient seen awake and alert eating in bed.  Family members at bedside.  PRBC transfusion currently ongoing.  Patient recounts passing out yesterday and that she feels a little better today.  Discussion with family regarding ensuring she eats well and stays hydrated at home.  Spouse will call cardiologist to review blood pressure meds.  No other acute complaints offered.  Objective:  Vitals:   03/01/24 1054 03/01/24 1055  BP: (!) 165/95 (!) 165/95   Pulse: 76 99  Resp: 16 16  Temp:  97.7 F (36.5 C)  SpO2: 100%      Intake/Output Summary (Last 24 hours) at 03/01/2024 1123 Last data filed at 02/29/2024 1800 Gross per 24 hour  Intake 144.74 ml  Output --  Net 144.74 ml     REVIEW OF SYSTEMS:   Constitutional: Denies fevers, chills or abnormal night sweats Eyes: Denies blurriness of vision, double vision or watery eyes Ears, nose, mouth, throat, and face: Denies mucositis or sore throat Respiratory: Denies cough, dyspnea or wheezes Cardiovascular: Denies palpitation, chest discomfort or lower extremity swelling Gastrointestinal:  Denies nausea, heartburn or change in bowel habits Skin: Denies abnormal skin rashes Lymphatics: Denies new lymphadenopathy or easy bruising Neurological: Denies numbness, tingling or new weaknesses Behavioral/Psych: Mood is stable, no new changes  All other systems were reviewed with the patient and are negative.  PHYSICAL EXAMINATION: ECOG PERFORMANCE STATUS: 2 - Symptomatic, <50% confined to bed  Vitals:   03/01/24 1054 03/01/24 1055  BP: (!) 165/95 (!) 165/95  Pulse: 76 99  Resp: 16 16  Temp:  97.7 F (36.5 C)  SpO2: 100%    There were no vitals filed for this visit.  GENERAL: alert, no distress and comfortable SKIN: skin color, texture, turgor are normal, no rashes or significant lesions EYES: normal, conjunctiva are pink and non-injected, sclera clear OROPHARYNX: no exudate, no erythema and lips, buccal mucosa, and tongue normal  NECK: supple, thyroid normal size, non-tender, without nodularity LYMPH: no palpable lymphadenopathy in the cervical, axillary or inguinal LUNGS: clear to auscultation and percussion with normal breathing effort HEART: regular rate &  rhythm and no murmurs and no lower extremity edema ABDOMEN: abdomen soft, non-tender and normal bowel sounds MUSCULOSKELETAL: no cyanosis of digits and no clubbing  PSYCH: alert & oriented x 3 with fluent speech NEURO: no  focal motor/sensory deficits   All questions were answered. The patient knows to call the clinic with any problems, questions or concerns.   The total time spent in the appointment was 40 minutes encounter with patient including review of chart and various tests results, discussions about plan of care and coordination of care plan  Dawson Bills, NP 03/01/2024 11:23 AM    Labs Reviewed:  Lab Results  Component Value Date   WBC 6.3 03/01/2024   HGB 7.4 (L) 03/01/2024   HCT 23.0 (L) 03/01/2024   MCV 99.1 03/01/2024   PLT 196 03/01/2024   Recent Labs    02/04/24 1222 02/19/24 0808 02/29/24 1100 02/29/24 1550 02/29/24 1655 03/01/24 0531  NA 132* 125* 131*  --  130* 130*  K 3.6 3.9 3.8  --  5.2* 3.7  CL 106 101 111  --  112* 112*  CO2 26 23 16*  --  16* 17*  GLUCOSE 89 130* 138*  --  114* 109*  BUN 31* 81* 116*  --  103* 86*  CREATININE 1.61* 1.81* 2.17* 1.06* 1.75* 1.53*  CALCIUM 8.4* 9.3 8.4*  --  8.4* 8.2*  GFRNONAA 33* 29* 23* 54* 30* 35*  PROT 11.2* >12.0* 11.4*  --   --   --   ALBUMIN 3.0* 3.2* 2.5*  --   --   --   AST 30 17 22   --   --   --   ALT 49* 19 40  --   --   --   ALKPHOS 23* 26* 23*  --   --   --   BILITOT 0.3 0.3 0.4  --   --   --     Studies Reviewed:  DG Chest Port 1 View Result Date: 02/29/2024 CLINICAL DATA:  Shortness of breath. EXAM: PORTABLE CHEST 1 VIEW COMPARISON:  None Available. FINDINGS: Low lung volume. Bilateral lung fields are clear. No dense consolidation or lung collapse. No frank pulmonary edema. Bilateral costophrenic angles are clear. There is elevated right hemidiaphragm. Normal cardio-mediastinal silhouette. No acute osseous abnormalities. The soft tissues are within normal limits. IMPRESSION: No active disease. Electronically Signed   By: Jules Schick M.D.   On: 02/29/2024 13:59   NM PET Image Initial (PI) Whole Body Result Date: 02/29/2024 CLINICAL DATA:  Initial treatment strategy for multiple myeloma. EXAM: NUCLEAR MEDICINE PET  WHOLE BODY TECHNIQUE: 5.8 mCi F-18 FDG was injected intravenously. Full-ring PET imaging was performed from the head to foot after the radiotracer. CT data was obtained and used for attenuation correction and anatomic localization. Fasting blood glucose: 120 mg/dl COMPARISON:  CT abdomen pelvis 09/03/2016. FINDINGS: Mediastinal blood pool activity: SUV max 2.5 HEAD/NECK: No abnormal hypermetabolism. Incidental CT findings: None. CHEST: No abnormal hypermetabolism. Incidental CT findings: Atherosclerotic calcification of the aorta. Heart is at the upper limits of normal in size to mildly enlarged. No pericardial or pleural effusion. ABDOMEN/PELVIS: No abnormal hypermetabolism. Incidental CT findings: None. SKELETON: Mild patchy uptake within the visualized osseous structures without focality. Muscular uptake superior to the left iliac wing (4/125), SUV max 3.4. Otherwise, no additional abnormal hypermetabolism. Incidental CT findings: Minimal degenerative change in the spine. Lytic lesion in the upper thoracic spine (4/47) without abnormal hypermetabolism. IMPRESSION: 1. No evidence of metabolically active  multiple myeloma. 2. Muscular uptake superior to the left iliac wing. Question recent injury. 3.  Aortic atherosclerosis (ICD10-I70.0). Electronically Signed   By: Leanna Battles M.D.   On: 02/29/2024 13:40   CT Head Wo Contrast Result Date: 02/29/2024 CLINICAL DATA:  Syncope/presyncope EXAM: CT HEAD WITHOUT CONTRAST TECHNIQUE: Contiguous axial images were obtained from the base of the skull through the vertex without intravenous contrast. RADIATION DOSE REDUCTION: This exam was performed according to the departmental dose-optimization program which includes automated exposure control, adjustment of the mA and/or kV according to patient size and/or use of iterative reconstruction technique. COMPARISON:  None Available. FINDINGS: Brain: No evidence of accelerated brain volume loss. No sign of old or acute focal  infarction, mass lesion, hemorrhage, hydrocephalus or extra-axial collection. Vascular: Very minimal atherosclerotic calcification of the major vessels at the base of the brain. Skull: Negative Sinuses/Orbits: Clear/normal Other: None IMPRESSION: No acute or traumatic finding. Very minimal atherosclerotic calcification of the major vessels at the base of the brain. Electronically Signed   By: Paulina Fusi M.D.   On: 02/29/2024 11:34   DG Bone Survey Met Result Date: 02/01/2024 CLINICAL DATA:  Myeloma EXAM: METASTATIC BONE SURVEY COMPARISON:  None Available. FINDINGS: No focal lytic lesion or acute bony abnormality. Degenerative changes in the [facility] joints bilaterally. Degenerative disc and facet disease in the cervical spine. Degenerative facet disease in the lower lumbar spine. Lungs are clear.  No effusions.  Heart is normal size. IMPRESSION: No acute bony abnormality or focal lytic lesion. Electronically Signed   By: Charlett Nose M.D.   On: 02/01/2024 12:44   CT BONE MARROW BIOPSY & ASPIRATION Result Date: 02/01/2024 CLINICAL DATA:  Myeloma EXAM: CT GUIDED DEEP ILIAC BONE ASPIRATION AND CORE BIOPSY TECHNIQUE: Patient was placed prone on the CT gantry and limited axial scans through the pelvis were obtained. This exam was performed according to the departmental dose-optimization program which includes automated exposure control, adjustment of the mA and/or kV according to patient size and/or use of iterative reconstruction technique. Appropriate skin entry site was identified. Skin site was marked, prepped with chlorhexidine, draped in usual sterile fashion, and infiltrated locally with 1% lidocaine. Intravenous Fentanyl and Versed 1.5mg  were administered by RN during a total moderate (conscious) sedation time of 11 minutes; the patient's level of consciousness and physiological / cardiorespiratory status were monitored continuously by radiology RN under my direct supervision. Under CT fluoroscopic  guidance an 11-gauge Cook trocar bone needle was advanced into the right iliac bone just lateral to the sacroiliac joint. Once needle tip position was confirmed, core and aspiration samples were obtained, submitted to pathology for approval. Patient tolerated procedure well. COMPLICATIONS: COMPLICATIONS none IMPRESSION: 1. Technically successful CT guided right iliac bone core and aspiration biopsy. Electronically Signed   By: Corlis Leak M.D.   On: 02/01/2024 12:13   ADDENDUM  .Patient was Personally and independently interviewed, examined and relevant elements of the history of present illness were reviewed in details and an assessment and plan was created. All elements of the patient's history of present illness , assessment and plan were discussed in details with Hinton Dyer NP. The above documentation reflects our combined findings assessment and plan.  Admitted with syncopal episode. Likely multifactorial due to symptomatic anemia + dehydration + orthostatic hypotension from anti HTN + micturation related vasovagal episode. Plan -transfuse to hgb of >=8 -continue outpatient myeloma rx-- no overt toxicitiy-- significant sedation from benadryl--plan to reduce dose with next treatment -will reschedule date  for next treatment. - IVF and adjustment of anti HTN per hospital medicine -PT evaluation. Discharge pet/ct results which showed no overt Myeloma related bone lesions -Myeloma fish panel +ve for t(11;14) -likely discharge 3/12. Appreciate hospital medicine cares    Wyvonnia Lora MD MS

## 2024-03-01 NOTE — Progress Notes (Signed)
 PT Cancellation Note  Patient Details Name: Cynthia Burns MRN: [id number] DOB: 1946/09/25   Cancelled Treatment:    Reason Eval/Treat Not Completed: Medical issues which prohibited therapy. Pt currently receiving PRBC transfusion. PT to return if schedule allows and to continue to follow acutely.   [name], PT Acute Rehab   [name] 03/01/2024, 1:58 PM

## 2024-03-01 NOTE — Progress Notes (Signed)
  Echocardiogram 2D Echocardiogram has been performed.  Ocie Doyne RDCS 03/01/2024, 2:42 PM

## 2024-03-01 NOTE — Plan of Care (Signed)
  Problem: Education: Goal: Ability to describe self-care measures that may prevent or decrease complications (Diabetes Survival Skills Education) will improve Outcome: Progressing   Problem: Coping: Goal: Ability to adjust to condition or change in health will improve Outcome: Progressing   Problem: Health Behavior/Discharge Planning: Goal: Ability to identify and utilize available resources and services will improve Outcome: Progressing   Problem: Metabolic: Goal: Ability to maintain appropriate glucose levels will improve Outcome: Progressing   Problem: Nutritional: Goal: Maintenance of adequate nutrition will improve Outcome: Progressing Goal: Progress toward achieving an optimal weight will improve Outcome: Progressing   Problem: Skin Integrity: Goal: Risk for impaired skin integrity will decrease Outcome: Progressing   Problem: Education: Goal: Knowledge of General Education information will improve Description: Including pain rating scale, medication(s)/side effects and non-pharmacologic comfort measures Outcome: Progressing   Problem: Activity: Goal: Risk for activity intolerance will decrease Outcome: Progressing   Problem: Nutrition: Goal: Adequate nutrition will be maintained Outcome: Progressing   Problem: Coping: Goal: Level of anxiety will decrease Outcome: Progressing   Problem: Pain Managment: Goal: General experience of comfort will improve and/or be controlled Outcome: Progressing   Problem: Safety: Goal: Ability to remain free from injury will improve Outcome: Progressing   Problem: Skin Integrity: Goal: Risk for impaired skin integrity will decrease Outcome: Progressing

## 2024-03-01 NOTE — Evaluation (Signed)
 Physical Therapy Evaluation Patient Details Name: Cynthia Burns MRN: [id number] DOB: 1946/02/03 Today's Date: 03/01/2024  History of Present Illness  78 yo female presents to therapy s/p hospital admission on 02/29/2024 due to LOC/syncope episode with reports of fatigue and light headedness. Pt PMH includes but is not limited to: allergy, anemia s/p transfusion, CKD IIII, DM II, heart aneurysm, anxiety, HTNhypothyroidism, mitral valve prolapse, and recent dx (01/2024) of multiple myeloma and on chemotherapy last received 3/4.  Clinical Impression    Pt admitted with above diagnosis.  Pt currently with functional limitations due to the deficits listed below (see PT Problem List). Pt in bed when PT arrived. Spouse present. Pt agreeable to therapy intervention and stated feeling better s/p transfusion. Pt indicated no dizziness or light headedness with commode transfers this pm. Nurse indicated Bp elevated with position changes prior to PT arrival. Pt is mod I for bed mobility, S with transfer tasks, CGA for gait tasks due to instability and erratic gait pattern with use of handrail for additional stability no AD 350 feet. Pt elected to return to bed s/p spouse and nurse placing clean sheets on bed, pt left seated in standard chair.  Pt will benefit from acute skilled PT to increase their independence and safety with mobility to allow discharge.         If plan is discharge home, recommend the following: A little help with walking and/or transfers;A little help with bathing/dressing/bathroom;Assistance with cooking/housework;Help with stairs or ramp for entrance;Assist for transportation   Can travel by private vehicle        Equipment Recommendations None recommended by PT  Recommendations for Other Services       Functional Status Assessment Patient has had a recent decline in their functional status and demonstrates the ability to make significant improvements in function in a reasonable and  predictable amount of time.     Precautions / Restrictions Precautions Precautions: Fall Restrictions Weight Bearing Restrictions Per Provider Order: No      Mobility  Bed Mobility Overal bed mobility: Modified Independent                  Transfers Overall transfer level: Needs assistance Equipment used: None Transfers: Sit to/from Stand Sit to Stand: Supervision           General transfer comment: min cues for safety and stability    Ambulation/Gait Ambulation/Gait assistance: Contact guard assist Gait Distance (Feet): 350 Feet Assistive device: None Gait Pattern/deviations: Step-through pattern, Scissoring, Staggering right, Staggering left, Narrow base of support Gait velocity: decreased     General Gait Details: pt exhibited an erratic gait pattern with intermittent scissoring and LE crossing, limited foot clearance and use of handrail for additional stability, pt indicated that she did not feel unstable and is not open to use of AD at this time  Stairs            Wheelchair Mobility     Tilt Bed    Modified Rankin (Stroke Patients Only)       Balance Overall balance assessment: Mild deficits observed, not formally tested (no hx of falls per pt and spouse)                                           Pertinent Vitals/Pain Pain Assessment Pain Assessment: No/denies pain    Home Living Family/patient expects to be  discharged to:: Private residence Living Arrangements: Spouse/significant other Available Help at Discharge: Family Type of Home: House Home Access: Stairs to enter Entrance Stairs-Rails: Right;Left;Can reach both Entrance Stairs-Number of Steps: 3   Home Layout: Two level;Able to live on main level with bedroom/bathroom Home Equipment: Rolling Walker (2 wheels);Cane - single point      Prior Function Prior Level of Function : Independent/Modified Independent             Mobility Comments: IND no AD  for all ADLs, self care tasks and IADls       Extremity/Trunk Assessment                Communication   Communication Communication: No apparent difficulties    Cognition Arousal: Alert Behavior During Therapy: WFL for tasks assessed/performed   PT - Cognitive impairments: No apparent impairments                       PT - Cognition Comments: slight confustion with requesting directions to be provided more than one time, some apparent poor self awareness and insight to deficits with unstable gait pattern and pt indicating she is fine Following commands: Intact       Cueing       General Comments      Exercises     Assessment/Plan    PT Assessment Patient needs continued PT services  PT Problem List Decreased activity tolerance;Decreased balance;Decreased mobility;Decreased safety awareness       PT Treatment Interventions DME instruction;Gait training;Stair training;Functional mobility training;Therapeutic activities;Therapeutic exercise;Balance training;Neuromuscular re-education;Patient/family education    PT Goals (Current goals can be found in the Care Plan section)  Acute Rehab PT Goals Patient Stated Goal: return home tomorrow PT Goal Formulation: With patient Time For Goal Achievement: 03/15/24 Potential to Achieve Goals: Good    Frequency Min 3X/week     Co-evaluation               AM-PAC PT "6 Clicks" Mobility  Outcome Measure Help needed turning from your back to your side while in a flat bed without using bedrails?: None Help needed moving from lying on your back to sitting on the side of a flat bed without using bedrails?: None Help needed moving to and from a bed to a chair (including a wheelchair)?: A Little Help needed standing up from a chair using your arms (e.g., wheelchair or bedside chair)?: A Little Help needed to walk in hospital room?: A Little Help needed climbing 3-5 steps with a railing? : A Lot 6 Click Score:  19    End of Session Equipment Utilized During Treatment: Gait belt Activity Tolerance: Patient tolerated treatment well Patient left: in chair;with family/visitor present (pt seated in standard chair with spouse and nurse making the bed) Nurse Communication: Mobility status PT Visit Diagnosis: Unsteadiness on feet (R26.81);Difficulty in walking, not elsewhere classified (R26.2)    Time: 1740-1806 PT Time Calculation (min) (ACUTE ONLY): 26 min   Charges:   PT Evaluation $PT Eval Low Complexity: 1 Low PT Treatments $Gait Training: 8-22 mins PT General Charges $$ ACUTE PT VISIT: 1 Visit         [name], PT Acute Rehab    [name] 03/01/2024, 7:42 PM

## 2024-03-02 DIAGNOSIS — D649 Anemia, unspecified: Secondary | ICD-10-CM | POA: Diagnosis not present

## 2024-03-02 DIAGNOSIS — R55 Syncope and collapse: Secondary | ICD-10-CM | POA: Diagnosis not present

## 2024-03-02 DIAGNOSIS — C9000 Multiple myeloma not having achieved remission: Secondary | ICD-10-CM | POA: Diagnosis not present

## 2024-03-02 LAB — CBC WITH DIFFERENTIAL/PLATELET
Abs Immature Granulocytes: 0.01 10*3/uL (ref 0.00–0.07)
Basophils Absolute: 0 10*3/uL (ref 0.0–0.1)
Basophils Relative: 0 %
Eosinophils Absolute: 0.1 10*3/uL (ref 0.0–0.5)
Eosinophils Relative: 2 %
HCT: 25.5 % — ABNORMAL LOW (ref 36.0–46.0)
Hemoglobin: 8.4 g/dL — ABNORMAL LOW (ref 12.0–15.0)
Immature Granulocytes: 0 %
Lymphocytes Relative: 23 %
Lymphs Abs: 1.2 10*3/uL (ref 0.7–4.0)
MCH: 31.3 pg (ref 26.0–34.0)
MCHC: 32.9 g/dL (ref 30.0–36.0)
MCV: 95.1 fL (ref 80.0–100.0)
Monocytes Absolute: 0.8 10*3/uL (ref 0.1–1.0)
Monocytes Relative: 15 %
Neutro Abs: 3.1 10*3/uL (ref 1.7–7.7)
Neutrophils Relative %: 60 %
Platelets: 183 10*3/uL (ref 150–400)
RBC: 2.68 MIL/uL — ABNORMAL LOW (ref 3.87–5.11)
RDW: 17.3 % — ABNORMAL HIGH (ref 11.5–15.5)
WBC: 5.2 10*3/uL (ref 4.0–10.5)
nRBC: 0 % (ref 0.0–0.2)

## 2024-03-02 LAB — MAGNESIUM: Magnesium: 1.7 mg/dL (ref 1.7–2.4)

## 2024-03-02 LAB — TYPE AND SCREEN
ABO/RH(D): A POS
Antibody Screen: POSITIVE
DAT, IgG: NEGATIVE
Unit division: 0

## 2024-03-02 LAB — COMPREHENSIVE METABOLIC PANEL
ALT: 30 U/L (ref 0–44)
AST: 17 U/L (ref 15–41)
Albumin: 2.4 g/dL — ABNORMAL LOW (ref 3.5–5.0)
Alkaline Phosphatase: 26 U/L — ABNORMAL LOW (ref 38–126)
Anion gap: 3 — ABNORMAL LOW (ref 5–15)
BUN: 60 mg/dL — ABNORMAL HIGH (ref 8–23)
CO2: 19 mmol/L — ABNORMAL LOW (ref 22–32)
Calcium: 8.1 mg/dL — ABNORMAL LOW (ref 8.9–10.3)
Chloride: 108 mmol/L (ref 98–111)
Creatinine, Ser: 1.22 mg/dL — ABNORMAL HIGH (ref 0.44–1.00)
GFR, Estimated: 46 mL/min — ABNORMAL LOW (ref >60)
Glucose, Bld: 92 mg/dL (ref 70–99)
Potassium: 3.5 mmol/L (ref 3.5–5.1)
Sodium: 130 mmol/L — ABNORMAL LOW (ref 135–145)
Total Bilirubin: 0.6 mg/dL (ref 0.0–1.2)
Total Protein: 10.7 g/dL — ABNORMAL HIGH (ref 6.5–8.1)

## 2024-03-02 LAB — BPAM RBC
Blood Product Expiration Date: 202504062359
ISSUE DATE / TIME: 202503111024
Unit Type and Rh: 6200

## 2024-03-02 LAB — GLUCOSE, CAPILLARY
Glucose-Capillary: 93 mg/dL (ref 70–99)
Glucose-Capillary: 116 mg/dL — ABNORMAL HIGH (ref 70–99)

## 2024-03-02 LAB — TSH: TSH: 3.071 u[IU]/mL (ref 0.350–4.500)

## 2024-03-02 LAB — PHOSPHORUS: Phosphorus: 3.7 mg/dL (ref 2.5–4.6)

## 2024-03-02 LAB — T4, FREE: Free T4: 0.91 ng/dL (ref 0.61–1.12)

## 2024-03-02 MED ORDER — VALSARTAN 160 MG PO TABS: 160.0000 mg | ORAL_TABLET | Freq: Every day | ORAL | 3 refills | Status: DC

## 2024-03-02 NOTE — Discharge Summary (Signed)
 Physician Discharge Summary   Cynthia Burns ZOX:[id number] DOB: 05/19/1946 DOA: 02/29/2024  PCP: Donato Schultz, DO  Admit date: 02/29/2024 Discharge date: 03/02/2024   Admitted From: Home Disposition:  Home  Discharging physician: Lewie Chamber, MD Barriers to discharge: none  Recommendations at discharge: Follow up with oncology  Repeat BMP Follow up BP log; adjust regimen further if necessary  Discharge Condition: stable CODE STATUS: Full  Diet recommendation:  Diet Orders (From admission, onward)     Start     Ordered   03/02/24 0000  Diet general        03/02/24 1154   02/29/24 1543  Diet regular Room service appropriate? Yes; Fluid consistency: Thin  Diet effective now       Question Answer Comment  Room service appropriate? Yes   Fluid consistency: Thin      02/29/24 1543            Hospital Course: [name] is a 78 yo female with PMH multiple myeloma, CKD, DM II, HTN, hypothyroidism who presented after an episode of syncope at home.  She had been excessively lethargic/fatigued and still on outpatient chemo.  She was also just finishing using the bathroom prior to her episode. Her pressure on check at home was 90/40.  She has also been on increased dose of hydralazine recently; blood pressure log was reviewed on husband's cell phone and majority of BP values were around SBP low 100s-110s and DBP 40s-60s after increasing hydralazine to 75 mg TID recently. She also remained on valsartan 320 mg daily.  Syncope was considered multifactorial. Hypotension, chemo with associated lethargy and poor intake with worsened renal function (creat 2.17 on admission, baseline noted around 1.6). She was also anemic with Hgb 7.3 g/dL on admission also.   She was given 1 unit PRBC; Hgb improved to 8.4 g/dL at discharge. She received fluid resuscitation and blood pressure medications were held. Creatinine normalized back to 1.22 at discharge.  Valsartan resumed at half  dose, 160 mg daily. Hydralazine held altogether with instructions to slowly resume if blood pressure became excessively elevated once again at home. Instructions in AVS.   She will follow-up with primary care and/or cardiology.   The patient's acute and chronic medical conditions were treated accordingly. On day of discharge, patient was felt deemed stable for discharge. Patient/family member advised to call PCP or come back to ER if needed.   Principal Diagnosis: Syncope  Discharge Diagnoses: Active Hospital Problems   Diagnosis Date Noted   Multiple myeloma (HCC) 01/29/2024    Priority: 2.   Anemia 11/26/2023    Priority: 3.   Acute renal failure superimposed on stage 3b chronic kidney disease (HCC) 01/29/2024    Resolved Hospital Problems   Diagnosis Date Noted Date Resolved   Syncope 02/29/2024 03/02/2024    Priority: 1.     Discharge Instructions     Diet general   Complete by: As directed    Increase activity slowly   Complete by: As directed       Allergies as of 03/02/2024       Reactions   Mucinex [guaifenesin Er] Anaphylaxis   Bystolic [nebivolol Hcl] Other (See Comments)   Lowered B/P markedly   Calcium-containing Compounds Other (See Comments)   Broke out the mouth inside- tiny bumps   Codeine Nausea And Vomiting   Paxlovid [nirmatrelvir-ritonavir] Diarrhea, Nausea And Vomiting   Advicor [niacin-lovastatin Er] Rash   Amlodipine Rash   Lisinopril-hydrochlorothiazide Rash  Other Rash, Other (See Comments)   Eye drops with preservatives   Ramipril Rash   Sulfonamide Derivatives Rash        Medication List     PAUSE taking these medications    hydrALAZINE 25 MG tablet Wait to take this until your doctor or other care provider tells you to start again. Commonly known as: APRESOLINE Take 25 mg by mouth See admin instructions. Take 25 mg by mouth at 8 AM, 3 PM, and 11 PM- in conjunction with one 50 mg tablet to equal a total dose of 75 mg        TAKE these medications    acyclovir 400 MG tablet Commonly known as: ZOVIRAX Take 1 tablet (400 mg total) by mouth 2 (two) times daily.   amoxicillin 500 MG capsule Commonly known as: AMOXIL Take 2,000 mg by mouth See admin instructions. Take 2,000 mg by mouth one hour prior to dental visits   Biotin 5000 MCG Caps Take 5,000 mcg by mouth daily.   clonazePAM 0.5 MG tablet Commonly known as: KlonoPIN Take 1 tablet (0.5 mg total) by mouth 2 (two) times daily as needed for anxiety.   Crestor 20 MG tablet Generic drug: rosuvastatin TAKE ONE TABLET BY MOUTH EVERY DAY   dexamethasone 4 MG tablet Commonly known as: DECADRON Take 5 tablets (20 mg) by mouth with breakfast the day after every daratumumab dose   levothyroxine 25 MCG tablet Commonly known as: Synthroid TAKE ONE TABLET BY MOUTH EVERY DAY BEFORE BREAKFAST What changed:  how much to take how to take this when to take this additional instructions   NovoLOG FlexPen 100 UNIT/ML FlexPen Generic drug: insulin aspart Per sliding scale max 40 u daily What changed:  how much to take when to take this additional instructions   ondansetron 8 MG tablet Commonly known as: Zofran Take 8 mg by mouth 30 to 60 min prior to Cyclophosphamide administration then take 8 mg every 8 hrs as needed for nausea and vomiting.   ONE TOUCH ULTRA 2 w/Device Kit CHECK BLOOD SUGAR TWICE DAILY.  DX CODE  E11.9   OneTouch Delica Plus Lancet33G Misc USE 1 LANCET TO TEST AS DIRECTED (DISCARD LANCET IN SHARPS CONTAINER IMMEDIATELY AFTER USE)   OneTouch Ultra Test test strip Generic drug: glucose blood Use as instructed   pantoprazole 40 MG tablet Commonly known as: PROTONIX Pantoprazole 40 mg before breakfast and dinner for 90 days with 3 refills What changed:  how much to take how to take this when to take this additional instructions   Pen Needles 31G X 6 MM Misc As directed   PreserVision AREDS 2 Caps Take 1 capsule by mouth 2  (two) times daily with a meal.   prochlorperazine 10 MG tablet Commonly known as: COMPAZINE Take 1 tablet (10 mg total) by mouth every 6 (six) hours as needed for nausea or vomiting.   Rybelsus 7 MG Tabs Generic drug: Semaglutide Take 1 tablet (7 mg total) by mouth daily.   Systane Ultra PF 0.4-0.3 % Soln Generic drug: Polyethyl Glyc-Propyl Glyc PF Place 1 drop into both eyes See admin instructions. Instill 1 drop into both eyes one to four times a day   UNKNOWN TO PATIENT Place 1 spray into both nostrils See admin instructions. Unnamed nasal emollient- Instill 1 spray into each nostril one to two times a day as needed for dry nasal passages   valsartan 160 MG tablet Commonly known as: DIOVAN Take 1 tablet (160 mg total)  by mouth daily. What changed:  medication strength how much to take   Vitamin B-12 3000 MCG Subl Place 3,000 mcg under the tongue daily in the afternoon.   Vitamin D3 1000 units Caps Take 1,000 Units by mouth daily.        Allergies  Allergen Reactions   Mucinex [Guaifenesin Er] Anaphylaxis   Bystolic [Nebivolol Hcl] Other (See Comments)    Lowered B/P markedly   Calcium-Containing Compounds Other (See Comments)    Broke out the mouth inside- tiny bumps   Codeine Nausea And Vomiting   Paxlovid [Nirmatrelvir-Ritonavir] Diarrhea and Nausea And Vomiting   Advicor [Niacin-Lovastatin Er] Rash   Amlodipine Rash   Lisinopril-Hydrochlorothiazide Rash   Other Rash and Other (See Comments)    Eye drops with preservatives   Ramipril Rash   Sulfonamide Derivatives Rash    Consultations: Oncology   Procedures:   Discharge Exam: BP (!) 145/71 (BP Location: Right Arm)   Pulse 82   Temp 98.2 F (36.8 C) (Oral)   Resp 17   SpO2 97%  Physical Exam Constitutional:      Appearance: Normal appearance.  HENT:     Head: Normocephalic and atraumatic.     Mouth/Throat:     Mouth: Mucous membranes are moist.  Eyes:     Extraocular Movements: Extraocular  movements intact.  Cardiovascular:     Rate and Rhythm: Normal rate and regular rhythm.  Pulmonary:     Effort: Pulmonary effort is normal. No respiratory distress.     Breath sounds: Normal breath sounds. No wheezing.  Abdominal:     General: Bowel sounds are normal. There is no distension.     Palpations: Abdomen is soft.     Tenderness: There is no abdominal tenderness.  Musculoskeletal:        General: Normal range of motion.     Cervical back: Normal range of motion and neck supple.  Skin:    General: Skin is warm and dry.  Neurological:     General: No focal deficit present.     Mental Status: She is alert.  Psychiatric:        Mood and Affect: Mood normal.      The results of significant diagnostics from this hospitalization (including imaging, microbiology, ancillary and laboratory) are listed below for reference.   Microbiology: Recent Results (from the past 240 hours)  Resp panel by RT-PCR (RSV, Flu A&B, Covid) Anterior Nasal Swab     Status: None   Collection Time: 02/29/24 11:00 AM   Specimen: Anterior Nasal Swab  Result Value Ref Range Status   SARS Coronavirus 2 by RT PCR NEGATIVE NEGATIVE Final    Comment: (NOTE) SARS-CoV-2 target nucleic acids are NOT DETECTED.  The SARS-CoV-2 RNA is generally detectable in upper respiratory specimens during the acute phase of infection. The lowest concentration of SARS-CoV-2 viral copies this assay can detect is 138 copies/mL. A negative result does not preclude SARS-Cov-2 infection and should not be used as the sole basis for treatment or other patient management decisions. A negative result may occur with  improper specimen collection/handling, submission of specimen other than nasopharyngeal swab, presence of viral mutation(s) within the areas targeted by this assay, and inadequate number of viral copies(<138 copies/mL). A negative result must be combined with clinical observations, patient history, and  epidemiological information. The expected result is Negative.  Fact Sheet for Patients:  BloggerCourse.com  Fact Sheet for Healthcare Providers:  SeriousBroker.it  This test is no t yet  approved or cleared by the [place] and  has been authorized for detection and/or diagnosis of SARS-CoV-2 by FDA under an Emergency Use Authorization (EUA). This EUA will remain  in effect (meaning this test can be used) for the duration of the COVID-19 declaration under Section 564(b)(1) of the Act, 21 U.S.C.section 360bbb-3(b)(1), unless the authorization is terminated  or revoked sooner.       Influenza A by PCR NEGATIVE NEGATIVE Final   Influenza B by PCR NEGATIVE NEGATIVE Final    Comment: (NOTE) The Xpert Xpress SARS-CoV-2/FLU/RSV plus assay is intended as an aid in the diagnosis of influenza from Nasopharyngeal swab specimens and should not be used as a sole basis for treatment. Nasal washings and aspirates are unacceptable for Xpert Xpress SARS-CoV-2/FLU/RSV testing.  Fact Sheet for Patients: BloggerCourse.com  Fact Sheet for Healthcare Providers: SeriousBroker.it  This test is not yet approved or cleared by the [place] FDA and has been authorized for detection and/or diagnosis of SARS-CoV-2 by FDA under an Emergency Use Authorization (EUA). This EUA will remain in effect (meaning this test can be used) for the duration of the COVID-19 declaration under Section 564(b)(1) of the Act, 21 U.S.C. section 360bbb-3(b)(1), unless the authorization is terminated or revoked.     Resp Syncytial Virus by PCR NEGATIVE NEGATIVE Final    Comment: (NOTE) Fact Sheet for Patients: BloggerCourse.com  Fact Sheet for Healthcare Providers: SeriousBroker.it  This test is not yet approved or cleared by the [place] FDA and has been  authorized for detection and/or diagnosis of SARS-CoV-2 by FDA under an Emergency Use Authorization (EUA). This EUA will remain in effect (meaning this test can be used) for the duration of the COVID-19 declaration under Section 564(b)(1) of the Act, 21 U.S.C. section 360bbb-3(b)(1), unless the authorization is terminated or revoked.  Performed at [facility], [address], [place], [place] [address]   Culture, blood (Routine X 2) w Reflex to ID Panel     Status: None (Preliminary result)   Collection Time: 02/29/24  9:07 PM   Specimen: BLOOD LEFT ARM  Result Value Ref Range Status   Specimen Description   Final    BLOOD LEFT ARM Performed at [facility], [address], [place], [place] [address]    Special Requests   Final    BOTTLES DRAWN AEROBIC AND ANAEROBIC Blood Culture results may not be optimal due to an inadequate volume of blood received in culture bottles Performed at [facility], [address], [place], [place] [address]    Culture   Final    NO GROWTH 2 DAYS Performed at [facility], [address], [place], [place] [address]    Report Status PENDING  Incomplete  Culture, blood (Routine X 2) w Reflex to ID Panel     Status: None (Preliminary result)   Collection Time: 02/29/24  9:07 PM   Specimen: BLOOD LEFT HAND  Result Value Ref Range Status   Specimen Description   Final    BLOOD LEFT HAND Performed at [facility], [address] [place], [place] [address]    Special Requests   Final    BOTTLES DRAWN AEROBIC AND ANAEROBIC Blood Culture results may not be optimal due to an inadequate volume of blood received in culture bottles Performed at [facility], [address] [place], [place] [address]    Culture   Final    NO GROWTH 2  DAYS Performed at [facility], [address], [place], [place] [address]    Report Status PENDING  Incomplete      Labs: BNP (last 3 results) No results for input(s): "BNP" in the last 8760 hours. Basic Metabolic Panel: Recent Labs  Lab 02/29/24 1100 02/29/24 1550 02/29/24 1655 03/01/24 0531 03/02/24 0603  NA 131*  --  130* 130* 130*  K 3.8  --  5.2* 3.7 3.5  CL 111  --  112* 112* 108  CO2 16*  --  16* 17* 19*  GLUCOSE 138*  --  114* 109* 92  BUN 116*  --  103* 86* 60*  CREATININE 2.17* 1.06* 1.75* 1.53* 1.22*  CALCIUM 8.4*  --  8.4* 8.2* 8.1*  MG  --   --   --   --  1.7  PHOS  --   --   --   --  3.7   Liver Function Tests: Recent Labs  Lab 02/29/24 1100 03/02/24 0603  AST 22 17  ALT 40 30  ALKPHOS 23* 26*  BILITOT 0.4 0.6  PROT 11.4* 10.7*  ALBUMIN 2.5* 2.4*   No results for input(s): "LIPASE", "AMYLASE" in the last 168 hours. No results for input(s): "AMMONIA" in the last 168 hours. CBC: Recent Labs  Lab 02/29/24 1100 02/29/24 1655 03/01/24 0531 03/02/24 0603  WBC 7.8 8.8 6.3 5.2  NEUTROABS 5.0  --   --  3.1  HGB 7.3* 7.6* 7.4* 8.4*  HCT 23.1* 24.6* 23.0* 25.5*  MCV 100.0 102.1* 99.1 95.1  PLT 205 201 196 183   Cardiac Enzymes: No results for input(s): "CKTOTAL", "CKMB", "CKMBINDEX", "TROPONINI" in the last 168 hours. BNP: Invalid input(s): "POCBNP" CBG: Recent Labs  Lab 03/01/24 1204 03/01/24 1732 03/01/24 2214 03/02/24 0732 03/02/24 1217  GLUCAP 97 111* 104* 93 116*   D-Dimer No results for input(s): "DDIMER" in the last 72 hours. Hgb A1c No results for input(s): "HGBA1C" in the last 72 hours. Lipid Profile No results for input(s): "CHOL", "HDL", "LDLCALC", "TRIG", "CHOLHDL", "LDLDIRECT" in the last 72 hours. Thyroid function studies Recent Labs    03/02/24 0603  TSH 3.071   Anemia work up No results for input(s): "VITAMINB12", "FOLATE", "FERRITIN", "TIBC", "IRON", "RETICCTPCT" in the last 72 hours. Urinalysis    Component Value Date/Time   COLORURINE STRAW (A) 02/29/2024 1346   APPEARANCEUR CLEAR 02/29/2024 1346   LABSPEC 1.012 02/29/2024  1346   PHURINE 5.0 02/29/2024 1346   GLUCOSEU NEGATIVE 02/29/2024 1346   GLUCOSEU NEGATIVE 08/18/2019 1020   HGBUR NEGATIVE 02/29/2024 1346   BILIRUBINUR NEGATIVE 02/29/2024 1346   BILIRUBINUR 1+ 09/25/2016 1119   KETONESUR NEGATIVE 02/29/2024 1346   PROTEINUR NEGATIVE 02/29/2024 1346   UROBILINOGEN 0.2 08/18/2019 1020   NITRITE NEGATIVE 02/29/2024 1346   LEUKOCYTESUR NEGATIVE 02/29/2024 1346   Sepsis Labs Recent Labs  Lab 02/29/24 1100 02/29/24 1655 03/01/24 0531 03/02/24 0603  WBC 7.8 8.8 6.3 5.2   Microbiology Recent Results (from the past 240 hours)  Resp panel by RT-PCR (RSV, Flu A&B, Covid) Anterior Nasal Swab     Status: None   Collection Time: 02/29/24 11:00 AM   Specimen: Anterior Nasal Swab  Result Value Ref Range Status   SARS Coronavirus 2 by RT PCR NEGATIVE NEGATIVE Final    Comment: (NOTE) SARS-CoV-2 target nucleic acids are NOT DETECTED.  The SARS-CoV-2 RNA is generally detectable in upper respiratory specimens during the acute phase of infection. The lowest concentration of SARS-CoV-2 viral copies this  assay can detect is 138 copies/mL. A negative result does not preclude SARS-Cov-2 infection and should not be used as the sole basis for treatment or other patient management decisions. A negative result may occur with  improper specimen collection/handling, submission of specimen other than nasopharyngeal swab, presence of viral mutation(s) within the areas targeted by this assay, and inadequate number of viral copies(<138 copies/mL). A negative result must be combined with clinical observations, patient history, and epidemiological information. The expected result is Negative.  Fact Sheet for Patients:  BloggerCourse.com  Fact Sheet for Healthcare Providers:  SeriousBroker.it  This test is no t yet approved or cleared by the [place] FDA and  has been authorized for detection and/or diagnosis of  SARS-CoV-2 by FDA under an Emergency Use Authorization (EUA). This EUA will remain  in effect (meaning this test can be used) for the duration of the COVID-19 declaration under Section 564(b)(1) of the Act, 21 U.S.C.section 360bbb-3(b)(1), unless the authorization is terminated  or revoked sooner.       Influenza A by PCR NEGATIVE NEGATIVE Final   Influenza B by PCR NEGATIVE NEGATIVE Final    Comment: (NOTE) The Xpert Xpress SARS-CoV-2/FLU/RSV plus assay is intended as an aid in the diagnosis of influenza from Nasopharyngeal swab specimens and should not be used as a sole basis for treatment. Nasal washings and aspirates are unacceptable for Xpert Xpress SARS-CoV-2/FLU/RSV testing.  Fact Sheet for Patients: BloggerCourse.com  Fact Sheet for Healthcare Providers: SeriousBroker.it  This test is not yet approved or cleared by the [place] FDA and has been authorized for detection and/or diagnosis of SARS-CoV-2 by FDA under an Emergency Use Authorization (EUA). This EUA will remain in effect (meaning this test can be used) for the duration of the COVID-19 declaration under Section 564(b)(1) of the Act, 21 U.S.C. section 360bbb-3(b)(1), unless the authorization is terminated or revoked.     Resp Syncytial Virus by PCR NEGATIVE NEGATIVE Final    Comment: (NOTE) Fact Sheet for Patients: BloggerCourse.com  Fact Sheet for Healthcare Providers: SeriousBroker.it  This test is not yet approved or cleared by the [place] FDA and has been authorized for detection and/or diagnosis of SARS-CoV-2 by FDA under an Emergency Use Authorization (EUA). This EUA will remain in effect (meaning this test can be used) for the duration of the COVID-19 declaration under Section 564(b)(1) of the Act, 21 U.S.C. section 360bbb-3(b)(1), unless the authorization is terminated  or revoked.  Performed at [facility], [address] [place], [place] [address]   Culture, blood (Routine X 2) w Reflex to ID Panel     Status: None (Preliminary result)   Collection Time: 02/29/24  9:07 PM   Specimen: BLOOD LEFT ARM  Result Value Ref Range Status   Specimen Description   Final    BLOOD LEFT ARM Performed at [facility], [address] [place], [place] [address]    Special Requests   Final    BOTTLES DRAWN AEROBIC AND ANAEROBIC Blood Culture results may not be optimal due to an inadequate volume of blood received in culture bottles Performed at [facility], [address] [place], [place] [address]    Culture   Final    NO GROWTH 2 DAYS Performed at [facility], [address] [place], [place] [address]    Report Status PENDING  Incomplete  Culture, blood (Routine X 2) w Reflex to ID Panel     Status: None (Preliminary  result)   Collection Time: 02/29/24  9:07 PM   Specimen: BLOOD LEFT HAND  Result Value Ref Range Status   Specimen Description   Final    BLOOD LEFT HAND Performed at [facility], [address], [place], [place] [address]    Special Requests   Final    BOTTLES DRAWN AEROBIC AND ANAEROBIC Blood Culture results may not be optimal due to an inadequate volume of blood received in culture bottles Performed at [facility], [address], [place], [place] [address]    Culture   Final    NO GROWTH 2 DAYS Performed at [facility], [address] [place], [place] [address]    Report Status PENDING  Incomplete    Procedures/Studies: ECHOCARDIOGRAM COMPLETE Result Date: 03/01/2024    ECHOCARDIOGRAM REPORT   Patient Name:   Cynthia Burns Date of Exam: 03/01/2024 Medical Rec #:  [id number]       Height:       61.0 in Accession #:    [id number]      Weight:       116.2 lb Date of Birth:  January 27, 1946      BSA:          1.500 m Patient  Age:    77 years        BP:           165/95 mmHg Patient Gender: F               HR:           79 bpm. Exam Location:  Inpatient Procedure: 2D Echo, Cardiac Doppler and Color Doppler (Both Spectral and Color            Flow Doppler were utilized during procedure). Indications:    Syncope  History:        Patient has prior history of Echocardiogram examinations, most                 recent 11/24/2018. Risk Factors:Hypertension, Diabetes and                 Dyslipidemia. CKD.  Sonographer:    Vern Claude Referring Phys: [id number] HERSH GOEL IMPRESSIONS  1. Left ventricular ejection fraction, by estimation, is 60 to 65%. The left ventricle has normal function. The left ventricle has no regional wall motion abnormalities. Left ventricular diastolic parameters are consistent with Grade I diastolic dysfunction (impaired relaxation).  2. Right ventricular systolic function is normal. The right ventricular size is normal. Tricuspid regurgitation signal is inadequate for assessing PA pressure.  3. The mitral valve is normal in structure. No evidence of mitral valve regurgitation. No evidence of mitral stenosis.  4. The aortic valve is tricuspid. Aortic valve regurgitation is trivial. No aortic stenosis is present.  5. Aortic dilatation noted. There is mild dilatation of the aortic root, measuring 39 mm.  6. The inferior vena cava is normal in size with greater than 50% respiratory variability, suggesting right atrial pressure of 3 mmHg. FINDINGS  Left Ventricle: Left ventricular ejection fraction, by estimation, is 60 to 65%. The left ventricle has normal function. The left ventricle has no regional wall motion abnormalities. The left ventricular internal cavity size was normal in size. There is  no left ventricular hypertrophy. Left ventricular diastolic parameters are consistent with Grade I diastolic dysfunction (impaired relaxation). Right Ventricle: The right ventricular size is normal. No increase in right ventricular  wall thickness. Right ventricular  systolic function is normal. Tricuspid regurgitation signal is inadequate for assessing PA pressure. Left Atrium: Left atrial size was normal in size. Right Atrium: Right atrial size was normal in size. Pericardium: There is no evidence of pericardial effusion. Mitral Valve: The mitral valve is normal in structure. No evidence of mitral valve regurgitation. No evidence of mitral valve stenosis. MV peak gradient, 10.6 mmHg. The mean mitral valve gradient is 5.0 mmHg. Tricuspid Valve: The tricuspid valve is normal in structure. Tricuspid valve regurgitation is not demonstrated. Aortic Valve: The aortic valve is tricuspid. Aortic valve regurgitation is trivial. No aortic stenosis is present. Aortic valve mean gradient measures 4.0 mmHg. Aortic valve peak gradient measures 6.7 mmHg. Aortic valve area, by VTI measures 1.95 cm. Pulmonic Valve: The pulmonic valve was normal in structure. Pulmonic valve regurgitation is not visualized. Aorta: Aortic dilatation noted. There is mild dilatation of the aortic root, measuring 39 mm. Venous: The inferior vena cava is normal in size with greater than 50% respiratory variability, suggesting right atrial pressure of 3 mmHg. IAS/Shunts: No atrial level shunt detected by color flow Doppler.  LEFT VENTRICLE PLAX 2D LVIDd:         4.00 cm      Diastology LVIDs:         2.60 cm      LV e' medial:    6.31 cm/s LV PW:         0.70 cm      LV E/e' medial:  10.6 LV IVS:        0.90 cm      LV e' lateral:   8.70 cm/s LVOT diam:     1.70 cm      LV E/e' lateral: 7.7 LV SV:         47 LV SV Index:   32 LVOT Area:     2.27 cm  LV Volumes (MOD) LV vol d, MOD A2C: 110.0 ml LV vol d, MOD A4C: 117.0 ml LV vol s, MOD A2C: 34.6 ml LV vol s, MOD A4C: 36.4 ml LV SV MOD A2C:     75.4 ml LV SV MOD A4C:     117.0 ml LV SV MOD BP:      78.3 ml RIGHT VENTRICLE             IVC RV Basal diam:  2.60 cm     IVC diam: 1.20 cm RV Mid diam:    1.60 cm RV S prime:     10.20 cm/s  TAPSE (M-mode): 1.8 cm LEFT ATRIUM             Index        RIGHT ATRIUM          Index LA diam:        2.90 cm 1.93 cm/m   RA Area:     5.86 cm LA Vol (A2C):   34.4 ml 22.94 ml/m  RA Volume:   6.04 ml  4.03 ml/m LA Vol (A4C):   27.4 ml 18.27 ml/m LA Biplane Vol: 32.6 ml 21.74 ml/m  AORTIC VALVE                    PULMONIC VALVE AV Area (Vmax):    1.60 cm     PV Vmax:       0.90 m/s AV Area (Vmean):   1.63 cm     PV Peak grad:  3.3 mmHg AV Area (VTI):     1.95 cm  AV Vmax:           129.00 cm/s AV Vmean:          88.300 cm/s AV VTI:            0.243 m AV Peak Grad:      6.7 mmHg AV Mean Grad:      4.0 mmHg LVOT Vmax:         90.70 cm/s LVOT Vmean:        63.300 cm/s LVOT VTI:          0.209 m LVOT/AV VTI ratio: 0.86  AORTA Ao Root diam: 3.90 cm MITRAL VALVE MV Area (PHT): 3.42 cm     SHUNTS MV Area VTI:   1.41 cm     Systemic VTI:  0.21 m MV Peak grad:  10.6 mmHg    Systemic Diam: 1.70 cm MV Mean grad:  5.0 mmHg MV Vmax:       1.63 m/s MV Vmean:      102.0 cm/s MV Decel Time: 222 msec MV E velocity: 66.70 cm/s MV A velocity: 112.00 cm/s MV E/A ratio:  0.60 Dalton McleanMD Electronically signed by Wilfred Lacy Signature Date/Time: 03/01/2024/2:46:46 PM    Final    DG Chest Port 1 View Result Date: 02/29/2024 CLINICAL DATA:  Shortness of breath. EXAM: PORTABLE CHEST 1 VIEW COMPARISON:  None Available. FINDINGS: Low lung volume. Bilateral lung fields are clear. No dense consolidation or lung collapse. No frank pulmonary edema. Bilateral costophrenic angles are clear. There is elevated right hemidiaphragm. Normal cardio-mediastinal silhouette. No acute osseous abnormalities. The soft tissues are within normal limits. IMPRESSION: No active disease. Electronically Signed   By: Jules Schick M.D.   On: 02/29/2024 13:59   NM PET Image Initial (PI) Whole Body Result Date: 02/29/2024 CLINICAL DATA:  Initial treatment strategy for multiple myeloma. EXAM: NUCLEAR MEDICINE PET WHOLE BODY TECHNIQUE: 5.8 mCi  F-18 FDG was injected intravenously. Full-ring PET imaging was performed from the head to foot after the radiotracer. CT data was obtained and used for attenuation correction and anatomic localization. Fasting blood glucose: 120 mg/dl COMPARISON:  CT abdomen pelvis 09/03/2016. FINDINGS: Mediastinal blood pool activity: SUV max 2.5 HEAD/NECK: No abnormal hypermetabolism. Incidental CT findings: None. CHEST: No abnormal hypermetabolism. Incidental CT findings: Atherosclerotic calcification of the aorta. Heart is at the upper limits of normal in size to mildly enlarged. No pericardial or pleural effusion. ABDOMEN/PELVIS: No abnormal hypermetabolism. Incidental CT findings: None. SKELETON: Mild patchy uptake within the visualized osseous structures without focality. Muscular uptake superior to the left iliac wing (4/125), SUV max 3.4. Otherwise, no additional abnormal hypermetabolism. Incidental CT findings: Minimal degenerative change in the spine. Lytic lesion in the upper thoracic spine (4/47) without abnormal hypermetabolism. IMPRESSION: 1. No evidence of metabolically active multiple myeloma. 2. Muscular uptake superior to the left iliac wing. Question recent injury. 3.  Aortic atherosclerosis (ICD10-I70.0). Electronically Signed   By: Leanna Battles M.D.   On: 02/29/2024 13:40   CT Head Wo Contrast Result Date: 02/29/2024 CLINICAL DATA:  Syncope/presyncope EXAM: CT HEAD WITHOUT CONTRAST TECHNIQUE: Contiguous axial images were obtained from the base of the skull through the vertex without intravenous contrast. RADIATION DOSE REDUCTION: This exam was performed according to the departmental dose-optimization program which includes automated exposure control, adjustment of the mA and/or kV according to patient size and/or use of iterative reconstruction technique. COMPARISON:  None Available. FINDINGS: Brain: No evidence of accelerated brain volume loss. No sign of old or acute  focal infarction, mass lesion,  hemorrhage, hydrocephalus or extra-axial collection. Vascular: Very minimal atherosclerotic calcification of the major vessels at the base of the brain. Skull: Negative Sinuses/Orbits: Clear/normal Other: None IMPRESSION: No acute or traumatic finding. Very minimal atherosclerotic calcification of the major vessels at the base of the brain. Electronically Signed   By: Paulina Fusi M.D.   On: 02/29/2024 11:34     Time coordinating discharge: Over 30 minutes    Lewie Chamber, MD  [facility] 03/02/2024, 4:28 PM

## 2024-03-02 NOTE — Discharge Instructions (Signed)
 Valsartan has been decreased to 160 mg daily (okay to take in the morning). Can cut your old 320 mg tablet in half but a new script has also been sent to your pharmacy.   Hold Hydralazine for now. Resume if blood pressure elevates greater than 160/95. Start back at 25 mg three times a day and increase further to 50 mg three times a day if still necessary, and then finally 75 mg three times a day if still necessary.  Might need to increase the valsartan back to 320 mg daily also, but discuss this with your primary care or cardiology before increasing back.

## 2024-03-02 NOTE — Progress Notes (Signed)
   03/02/24 1107  TOC Brief Assessment  Insurance and Status Reviewed  Patient has primary care physician Yes  Home environment has been reviewed Home w/ spouse  Prior level of function: Independent  Prior/Current Home Services No current home services  Social Drivers of Health Review SDOH reviewed no interventions necessary  Readmission risk has been reviewed Yes  Transition of care needs no transition of care needs at this time

## 2024-03-02 NOTE — Hospital Course (Signed)
 Cynthia Burns is a 78 yo female with PMH multiple myeloma, CKD, DM II, HTN, hypothyroidism who presented after an episode of syncope at home.  She had been excessively lethargic/fatigued and still on outpatient chemo.  She was also just finishing using the bathroom prior to her episode. Her pressure on check at home was 90/40.  She has also been on increased dose of hydralazine recently; blood pressure log was reviewed on husband's cell phone and majority of BP values were around SBP low 100s-110s and DBP 40s-60s after increasing hydralazine to 75 mg TID recently. She also remained on valsartan 320 mg daily.  Syncope was considered multifactorial. Hypotension, chemo with associated lethargy and poor intake with worsened renal function (creat 2.17 on admission, baseline noted around 1.6). She was also anemic with Hgb 7.3 g/dL on admission also.   She was given 1 unit PRBC; Hgb improved to 8.4 g/dL at discharge. She received fluid resuscitation and blood pressure medications were held. Creatinine normalized back to 1.22 at discharge.  Valsartan resumed at half dose, 160 mg daily. Hydralazine held altogether with instructions to slowly resume if blood pressure became excessively elevated once again at home. Instructions in AVS.   She will follow-up with primary care and/or cardiology.

## 2024-03-02 NOTE — Progress Notes (Addendum)
 Cynthia Burns   DOB:12-13-1946   WU#:[id number]      ASSESSMENT & PLAN:  Fatigue/generalized weakness Syncopal episode - Improved - Patient admitted 02/29/2024 with chief complaint of increasing fatigue for several weeks and syncopal episode. -Likely multifactorial due to symptomatic anemia + dehydration + orthostatic hypotension from anti HTN + micturation related vasovagal episode.  - Status post IV fluids - Encourage out of bed activity - Continue supportive care   Multiple myeloma - Diagnosed February 2025 - Outside lab done had Dr. Doristine Church office showed M spike 6.1 with kappa light chains in the 400s. - Initially seen in outpatient oncology on 01/30/2024.  Multiple myeloma confirmed via path.  Myeloma fish panel +ve for t(11;14)  - Started on daratumumab, cyclophosphamide, Velcade and dexamethasone q. 28 days, on 02/04/2024 received cycle 1. - continue outpatient myeloma rx-- no overt toxicitiy-- significant sedation from benadryl--plan to reduce dose with next treatment -will reschedule date for next treatment. - Patient instructed to follow-up with Dr. Thedore Mins regarding kidney biopsy. - Medical oncology/Dr. Candise Che following   Renal insufficiency - slight improvement -Elevated creatinine in the mid 1 range.  BUN now decreasing, 60 today., was 33 one month ago. - Likely due to myeloma - Continue to monitor CMP   Anemia, normocytic -Hemoglobin 8.4 today.  Status post prbc transfusion yesterday.   - Baseline hemoglobin seems to be in the 8-9 range - Likely due to multiple myeloma and renal dysfunction - Recommend PRBC transfusion for Hgb >=8 - Continue to monitor CBC with differential   Anxiety - On clonazepam - Continue to monitor   Hypertension Hyperlipidemia Diabetes - Monitor blood pressure.  IVF and adjustment of anti HTN per hospital medicine  - Monitor blood sugar levels - Medicine following      Code Status Full  Subjective:  Patient awake and alert, being  discharged today.  Will follow-up with oncology outpatient.  Objective:  Vitals:   03/01/24 2011 03/02/24 0422  BP: (!) 172/88 (!) 145/71  Pulse: 79 82  Resp: 18 17  Temp: 97.9 F (36.6 C) 98.2 F (36.8 C)  SpO2: 98% 97%     Intake/Output Summary (Last 24 hours) at 03/02/2024 1107 Last data filed at 03/01/2024 1415 Gross per 24 hour  Intake 430 ml  Output --  Net 430 ml     REVIEW OF SYSTEMS:   Constitutional: Denies fevers, chills or abnormal night sweats Eyes: Denies blurriness of vision, double vision or watery eyes Ears, nose, mouth, throat, and face: Denies mucositis or sore throat Respiratory: Denies cough, dyspnea or wheezes Cardiovascular: Denies palpitation, chest discomfort or lower extremity swelling Gastrointestinal:  Denies nausea, heartburn or change in bowel habits Skin: Denies abnormal skin rashes Lymphatics: Denies new lymphadenopathy or easy bruising Neurological: Denies numbness, tingling or new weaknesses Behavioral/Psych: Mood is stable, no new changes  All other systems were reviewed with the patient and are negative.  PHYSICAL EXAMINATION: ECOG PERFORMANCE STATUS: 1 - Symptomatic but completely ambulatory  Vitals:   03/01/24 2011 03/02/24 0422  BP: (!) 172/88 (!) 145/71  Pulse: 79 82  Resp: 18 17  Temp: 97.9 F (36.6 C) 98.2 F (36.8 C)  SpO2: 98% 97%   There were no vitals filed for this visit.  GENERAL: alert, no distress and comfortable SKIN: skin color, texture, turgor are normal, no rashes or significant lesions EYES: normal, conjunctiva are pink and non-injected, sclera clear OROPHARYNX: no exudate, no erythema and lips, buccal mucosa, and tongue normal  NECK: supple, thyroid  normal size, non-tender, without nodularity LYMPH: no palpable lymphadenopathy in the cervical, axillary or inguinal LUNGS: clear to auscultation and percussion with normal breathing effort HEART: regular rate & rhythm and no murmurs and no lower extremity  edema ABDOMEN: abdomen soft, non-tender and normal bowel sounds MUSCULOSKELETAL: no cyanosis of digits and no clubbing  PSYCH: alert & oriented x 3 with fluent speech NEURO: no focal motor/sensory deficits   All questions were answered. The patient knows to call the clinic with any problems, questions or concerns.   The total time spent in the appointment was 30 minutes encounter with patient including review of chart and various tests results, discussions about plan of care and coordination of care plan  Dawson Bills, NP 03/02/2024 11:07 AM    Labs Reviewed:  Lab Results  Component Value Date   WBC 5.2 03/02/2024   HGB 8.4 (L) 03/02/2024   HCT 25.5 (L) 03/02/2024   MCV 95.1 03/02/2024   PLT 183 03/02/2024   Recent Labs    02/19/24 0808 02/29/24 1100 02/29/24 1550 02/29/24 1655 03/01/24 0531 03/02/24 0603  NA 125* 131*  --  130* 130* 130*  K 3.9 3.8  --  5.2* 3.7 3.5  CL 101 111  --  112* 112* 108  CO2 23 16*  --  16* 17* 19*  GLUCOSE 130* 138*  --  114* 109* 92  BUN 81* 116*  --  103* 86* 60*  CREATININE 1.81* 2.17*   < > 1.75* 1.53* 1.22*  CALCIUM 9.3 8.4*  --  8.4* 8.2* 8.1*  GFRNONAA 29* 23*   < > 30* 35* 46*  PROT >12.0* 11.4*  --   --   --  10.7*  ALBUMIN 3.2* 2.5*  --   --   --  2.4*  AST 17 22  --   --   --  17  ALT 19 40  --   --   --  30  ALKPHOS 26* 23*  --   --   --  26*  BILITOT 0.3 0.4  --   --   --  0.6   < > = values in this interval not displayed.    Studies Reviewed:  ECHOCARDIOGRAM COMPLETE Result Date: 03/01/2024    ECHOCARDIOGRAM REPORT   Patient Name:   Cynthia Burns Date of Exam: 03/01/2024 Medical Rec #:  [id number]       Height:       61.0 in Accession #:    [id number]      Weight:       116.2 lb Date of Birth:  01-May-1946      BSA:          1.500 m Patient Age:    77 years        BP:           165/95 mmHg Patient Gender: F               HR:           79 bpm. Exam Location:  Inpatient Procedure: 2D Echo, Cardiac Doppler and Color Doppler  (Both Spectral and Color            Flow Doppler were utilized during procedure). Indications:    Syncope  History:        Patient has prior history of Echocardiogram examinations, most                 recent 11/24/2018. Risk Factors:Hypertension, Diabetes  and                 Dyslipidemia. CKD.  Sonographer:    Vern Claude Referring Phys: [id number] HERSH GOEL IMPRESSIONS  1. Left ventricular ejection fraction, by estimation, is 60 to 65%. The left ventricle has normal function. The left ventricle has no regional wall motion abnormalities. Left ventricular diastolic parameters are consistent with Grade I diastolic dysfunction (impaired relaxation).  2. Right ventricular systolic function is normal. The right ventricular size is normal. Tricuspid regurgitation signal is inadequate for assessing PA pressure.  3. The mitral valve is normal in structure. No evidence of mitral valve regurgitation. No evidence of mitral stenosis.  4. The aortic valve is tricuspid. Aortic valve regurgitation is trivial. No aortic stenosis is present.  5. Aortic dilatation noted. There is mild dilatation of the aortic root, measuring 39 mm.  6. The inferior vena cava is normal in size with greater than 50% respiratory variability, suggesting right atrial pressure of 3 mmHg. FINDINGS  Left Ventricle: Left ventricular ejection fraction, by estimation, is 60 to 65%. The left ventricle has normal function. The left ventricle has no regional wall motion abnormalities. The left ventricular internal cavity size was normal in size. There is  no left ventricular hypertrophy. Left ventricular diastolic parameters are consistent with Grade I diastolic dysfunction (impaired relaxation). Right Ventricle: The right ventricular size is normal. No increase in right ventricular wall thickness. Right ventricular systolic function is normal. Tricuspid regurgitation signal is inadequate for assessing PA pressure. Left Atrium: Left atrial size was normal in size.  Right Atrium: Right atrial size was normal in size. Pericardium: There is no evidence of pericardial effusion. Mitral Valve: The mitral valve is normal in structure. No evidence of mitral valve regurgitation. No evidence of mitral valve stenosis. MV peak gradient, 10.6 mmHg. The mean mitral valve gradient is 5.0 mmHg. Tricuspid Valve: The tricuspid valve is normal in structure. Tricuspid valve regurgitation is not demonstrated. Aortic Valve: The aortic valve is tricuspid. Aortic valve regurgitation is trivial. No aortic stenosis is present. Aortic valve mean gradient measures 4.0 mmHg. Aortic valve peak gradient measures 6.7 mmHg. Aortic valve area, by VTI measures 1.95 cm. Pulmonic Valve: The pulmonic valve was normal in structure. Pulmonic valve regurgitation is not visualized. Aorta: Aortic dilatation noted. There is mild dilatation of the aortic root, measuring 39 mm. Venous: The inferior vena cava is normal in size with greater than 50% respiratory variability, suggesting right atrial pressure of 3 mmHg. IAS/Shunts: No atrial level shunt detected by color flow Doppler.  LEFT VENTRICLE PLAX 2D LVIDd:         4.00 cm      Diastology LVIDs:         2.60 cm      LV e' medial:    6.31 cm/s LV PW:         0.70 cm      LV E/e' medial:  10.6 LV IVS:        0.90 cm      LV e' lateral:   8.70 cm/s LVOT diam:     1.70 cm      LV E/e' lateral: 7.7 LV SV:         47 LV SV Index:   32 LVOT Area:     2.27 cm  LV Volumes (MOD) LV vol d, MOD A2C: 110.0 ml LV vol d, MOD A4C: 117.0 ml LV vol s, MOD A2C: 34.6 ml LV vol s, MOD A4C:  36.4 ml LV SV MOD A2C:     75.4 ml LV SV MOD A4C:     117.0 ml LV SV MOD BP:      78.3 ml RIGHT VENTRICLE             IVC RV Basal diam:  2.60 cm     IVC diam: 1.20 cm RV Mid diam:    1.60 cm RV S prime:     10.20 cm/s TAPSE (M-mode): 1.8 cm LEFT ATRIUM             Index        RIGHT ATRIUM          Index LA diam:        2.90 cm 1.93 cm/m   RA Area:     5.86 cm LA Vol (A2C):   34.4 ml 22.94 ml/m  RA  Volume:   6.04 ml  4.03 ml/m LA Vol (A4C):   27.4 ml 18.27 ml/m LA Biplane Vol: 32.6 ml 21.74 ml/m  AORTIC VALVE                    PULMONIC VALVE AV Area (Vmax):    1.60 cm     PV Vmax:       0.90 m/s AV Area (Vmean):   1.63 cm     PV Peak grad:  3.3 mmHg AV Area (VTI):     1.95 cm AV Vmax:           129.00 cm/s AV Vmean:          88.300 cm/s AV VTI:            0.243 m AV Peak Grad:      6.7 mmHg AV Mean Grad:      4.0 mmHg LVOT Vmax:         90.70 cm/s LVOT Vmean:        63.300 cm/s LVOT VTI:          0.209 m LVOT/AV VTI ratio: 0.86  AORTA Ao Root diam: 3.90 cm MITRAL VALVE MV Area (PHT): 3.42 cm     SHUNTS MV Area VTI:   1.41 cm     Systemic VTI:  0.21 m MV Peak grad:  10.6 mmHg    Systemic Diam: 1.70 cm MV Mean grad:  5.0 mmHg MV Vmax:       1.63 m/s MV Vmean:      102.0 cm/s MV Decel Time: 222 msec MV E velocity: 66.70 cm/s MV A velocity: 112.00 cm/s MV E/A ratio:  0.60 Dalton McleanMD Electronically signed by Wilfred Lacy Signature Date/Time: 03/01/2024/2:46:46 PM    Final    DG Chest Port 1 View Result Date: 02/29/2024 CLINICAL DATA:  Shortness of breath. EXAM: PORTABLE CHEST 1 VIEW COMPARISON:  None Available. FINDINGS: Low lung volume. Bilateral lung fields are clear. No dense consolidation or lung collapse. No frank pulmonary edema. Bilateral costophrenic angles are clear. There is elevated right hemidiaphragm. Normal cardio-mediastinal silhouette. No acute osseous abnormalities. The soft tissues are within normal limits. IMPRESSION: No active disease. Electronically Signed   By: Jules Schick M.D.   On: 02/29/2024 13:59   NM PET Image Initial (PI) Whole Body Result Date: 02/29/2024 CLINICAL DATA:  Initial treatment strategy for multiple myeloma. EXAM: NUCLEAR MEDICINE PET WHOLE BODY TECHNIQUE: 5.8 mCi F-18 FDG was injected intravenously. Full-ring PET imaging was performed from the head to foot after the radiotracer. CT data was obtained and used for  attenuation correction and anatomic  localization. Fasting blood glucose: 120 mg/dl COMPARISON:  CT abdomen pelvis 09/03/2016. FINDINGS: Mediastinal blood pool activity: SUV max 2.5 HEAD/NECK: No abnormal hypermetabolism. Incidental CT findings: None. CHEST: No abnormal hypermetabolism. Incidental CT findings: Atherosclerotic calcification of the aorta. Heart is at the upper limits of normal in size to mildly enlarged. No pericardial or pleural effusion. ABDOMEN/PELVIS: No abnormal hypermetabolism. Incidental CT findings: None. SKELETON: Mild patchy uptake within the visualized osseous structures without focality. Muscular uptake superior to the left iliac wing (4/125), SUV max 3.4. Otherwise, no additional abnormal hypermetabolism. Incidental CT findings: Minimal degenerative change in the spine. Lytic lesion in the upper thoracic spine (4/47) without abnormal hypermetabolism. IMPRESSION: 1. No evidence of metabolically active multiple myeloma. 2. Muscular uptake superior to the left iliac wing. Question recent injury. 3.  Aortic atherosclerosis (ICD10-I70.0). Electronically Signed   By: Leanna Battles M.D.   On: 02/29/2024 13:40   CT Head Wo Contrast Result Date: 02/29/2024 CLINICAL DATA:  Syncope/presyncope EXAM: CT HEAD WITHOUT CONTRAST TECHNIQUE: Contiguous axial images were obtained from the base of the skull through the vertex without intravenous contrast. RADIATION DOSE REDUCTION: This exam was performed according to the departmental dose-optimization program which includes automated exposure control, adjustment of the mA and/or kV according to patient size and/or use of iterative reconstruction technique. COMPARISON:  None Available. FINDINGS: Brain: No evidence of accelerated brain volume loss. No sign of old or acute focal infarction, mass lesion, hemorrhage, hydrocephalus or extra-axial collection. Vascular: Very minimal atherosclerotic calcification of the major vessels at the base of the brain. Skull: Negative Sinuses/Orbits:  Clear/normal Other: None IMPRESSION: No acute or traumatic finding. Very minimal atherosclerotic calcification of the major vessels at the base of the brain. Electronically Signed   By: Paulina Fusi M.D.   On: 02/29/2024 11:34   CT BONE MARROW BIOPSY & ASPIRATION Result Date: 02/01/2024 CLINICAL DATA:  Myeloma EXAM: CT GUIDED DEEP ILIAC BONE ASPIRATION AND CORE BIOPSY TECHNIQUE: Patient was placed prone on the CT gantry and limited axial scans through the pelvis were obtained. This exam was performed according to the departmental dose-optimization program which includes automated exposure control, adjustment of the mA and/or kV according to patient size and/or use of iterative reconstruction technique. Appropriate skin entry site was identified. Skin site was marked, prepped with chlorhexidine, draped in usual sterile fashion, and infiltrated locally with 1% lidocaine. Intravenous Fentanyl and Versed 1.5mg  were administered by RN during a total moderate (conscious) sedation time of 11 minutes; the patient's level of consciousness and physiological / cardiorespiratory status were monitored continuously by radiology RN under my direct supervision. Under CT fluoroscopic guidance an 11-gauge Cook trocar bone needle was advanced into the right iliac bone just lateral to the sacroiliac joint. Once needle tip position was confirmed, core and aspiration samples were obtained, submitted to pathology for approval. Patient tolerated procedure well. COMPLICATIONS: COMPLICATIONS none IMPRESSION: 1. Technically successful CT guided right iliac bone core and aspiration biopsy. Electronically Signed   By: Corlis Leak M.D.   On: 02/01/2024 12:13    ADDENDUM  .Patient was Personally and independently interviewed, examined and relevant elements of the history of present illness were reviewed in details and an assessment and plan was created. All elements of the patient's history of present illness , assessment and plan  were discussed in details with Hinton Dyer NP. The above documentation reflects our combined findings assessment and plan.   Wyvonnia Lora MD MS

## 2024-03-03 ENCOUNTER — Telehealth: Payer: Self-pay | Admitting: *Deleted

## 2024-03-03 NOTE — Transitions of Care (Post Inpatient/ED Visit) (Signed)
   03/03/2024  Name: Cynthia Burns MRN: [id number] DOB: August 08, 1946  Today's TOC FU Call Status: Today's TOC FU Call Status:: Unsuccessful Call (1st Attempt) Unsuccessful Call (1st Attempt) Date: 03/03/24  Attempted to reach the patient regarding the most recent Inpatient visit; left HIPAA compliant voice message requesting call back  Follow Up Plan: Additional outreach attempts will be made to reach the patient to complete the Transitions of Care (Post Inpatient visit) call.   Pls call/ message for questions,  Caryl Pina, RN, BSN, CCRN Alumnus RN Care Manager  Transitions of Care  [facility] - [facility] [phone]: direct office

## 2024-03-03 NOTE — Progress Notes (Signed)
 Contacted pt per Dr Candise Che to :have pt tot take her Dexamethasone 20mg  (5 pills) today due to the fact that she was hospitalized and missed her last tx and next tx will not be until 03/08/24. Pt and husband acknowledged information and verbalized understanding.

## 2024-03-04 ENCOUNTER — Telehealth: Payer: Self-pay | Admitting: *Deleted

## 2024-03-04 NOTE — Transitions of Care (Post Inpatient/ED Visit) (Signed)
   03/04/2024  Name: Elwyn Lowden MRN: [id number] DOB: 03-Jan-1946  Today's TOC FU Call Status: Today's TOC FU Call Status:: Unsuccessful Call (2nd Attempt) Unsuccessful Call (2nd Attempt) Date: 03/04/24  Attempted to reach the patient regarding the most recent Inpatient visit; left HIPAA compliant voice message requesting call back  Follow Up Plan: Additional outreach attempts will be made to reach the patient to complete the Transitions of Care (Post Inpatient visit) call.   Pls call/ message for questions,  Caryl Pina, RN, BSN, CCRN Alumnus RN Care Manager  Transitions of Care  [facility] [phone]: direct office

## 2024-03-05 LAB — CULTURE, BLOOD (ROUTINE X 2)
Culture: NO GROWTH
Culture: NO GROWTH

## 2024-03-07 ENCOUNTER — Telehealth: Payer: Self-pay | Admitting: *Deleted

## 2024-03-07 NOTE — Transitions of Care (Post Inpatient/ED Visit) (Signed)
   03/07/2024  Name: Lindaann Gradilla MRN: [id number] DOB: 03-Nov-1946  Today's TOC FU Call Status: Today's TOC FU Call Status:: Unsuccessful Call (3rd Attempt) Unsuccessful Call (3rd Attempt) Date: 03/07/24  Attempted to reach the patient regarding the most recent Inpatient visit; left HIPAA compliant voice message requesting call back  Follow Up Plan: No further outreach attempts will be made at this time. We have been unable to contact the patient.  Pls call/ message for questions,  Caryl Pina, RN, BSN, CCRN Alumnus RN Care Manager  Transitions of Care  [facility] - [facility] [phone]: direct office

## 2024-03-08 ENCOUNTER — Inpatient Hospital Stay: Payer: Medicare Other

## 2024-03-08 ENCOUNTER — Inpatient Hospital Stay (HOSPITAL_BASED_OUTPATIENT_CLINIC_OR_DEPARTMENT_OTHER): Payer: Medicare Other | Admitting: Hematology

## 2024-03-08 VITALS — BP 130/67 | HR 80 | Resp 18

## 2024-03-08 VITALS — BP 114/53 | HR 73 | Temp 97.7°F | Resp 17 | Ht 61.0 in | Wt 109.9 lb

## 2024-03-08 DIAGNOSIS — Z5111 Encounter for antineoplastic chemotherapy: Secondary | ICD-10-CM

## 2024-03-08 DIAGNOSIS — Z79620 Long term (current) use of immunosuppressive biologic: Secondary | ICD-10-CM | POA: Diagnosis not present

## 2024-03-08 DIAGNOSIS — C9000 Multiple myeloma not having achieved remission: Secondary | ICD-10-CM

## 2024-03-08 DIAGNOSIS — Z5112 Encounter for antineoplastic immunotherapy: Secondary | ICD-10-CM | POA: Diagnosis not present

## 2024-03-08 DIAGNOSIS — Z79899 Other long term (current) drug therapy: Secondary | ICD-10-CM | POA: Diagnosis not present

## 2024-03-08 LAB — CBC WITH DIFFERENTIAL (CANCER CENTER ONLY)
Abs Immature Granulocytes: 0.03 10*3/uL (ref 0.00–0.07)
Basophils Absolute: 0 10*3/uL (ref 0.0–0.1)
Basophils Relative: 0 %
Eosinophils Absolute: 0.1 10*3/uL (ref 0.0–0.5)
Eosinophils Relative: 1 %
HCT: 28.5 % — ABNORMAL LOW (ref 36.0–46.0)
Hemoglobin: 9.4 g/dL — ABNORMAL LOW (ref 12.0–15.0)
Immature Granulocytes: 0 %
Lymphocytes Relative: 18 %
Lymphs Abs: 1.4 10*3/uL (ref 0.7–4.0)
MCH: 31.5 pg (ref 26.0–34.0)
MCHC: 33 g/dL (ref 30.0–36.0)
MCV: 95.6 fL (ref 80.0–100.0)
Monocytes Absolute: 0.5 10*3/uL (ref 0.1–1.0)
Monocytes Relative: 7 %
Neutro Abs: 5.6 10*3/uL (ref 1.7–7.7)
Neutrophils Relative %: 74 %
Platelet Count: 170 10*3/uL (ref 150–400)
RBC: 2.98 MIL/uL — ABNORMAL LOW (ref 3.87–5.11)
RDW: 16.5 % — ABNORMAL HIGH (ref 11.5–15.5)
WBC Count: 7.5 10*3/uL (ref 4.0–10.5)
nRBC: 0 % (ref 0.0–0.2)

## 2024-03-08 LAB — CMP (CANCER CENTER ONLY)
ALT: 83 U/L — ABNORMAL HIGH (ref 0–44)
AST: 57 U/L — ABNORMAL HIGH (ref 15–41)
Albumin: 3.2 g/dL — ABNORMAL LOW (ref 3.5–5.0)
Alkaline Phosphatase: 37 U/L — ABNORMAL LOW (ref 38–126)
Anion gap: 2 — ABNORMAL LOW (ref 5–15)
BUN: 88 mg/dL — ABNORMAL HIGH (ref 8–23)
CO2: 22 mmol/L (ref 22–32)
Calcium: 8.7 mg/dL — ABNORMAL LOW (ref 8.9–10.3)
Chloride: 104 mmol/L (ref 98–111)
Creatinine: 1.78 mg/dL — ABNORMAL HIGH (ref 0.44–1.00)
GFR, Estimated: 29 mL/min — ABNORMAL LOW (ref >60)
Glucose, Bld: 147 mg/dL — ABNORMAL HIGH (ref 70–99)
Potassium: 3.7 mmol/L (ref 3.5–5.1)
Sodium: 128 mmol/L — ABNORMAL LOW (ref 135–145)
Total Bilirubin: 0.3 mg/dL (ref 0.0–1.2)
Total Protein: >12 g/dL — ABNORMAL HIGH (ref 6.5–8.1)

## 2024-03-08 MED ORDER — MONTELUKAST SODIUM 10 MG PO TABS
10.0000 mg | ORAL_TABLET | Freq: Once | ORAL | Status: AC
  Administered 2024-03-08: 10 mg via ORAL
  Filled 2024-03-08: qty 1

## 2024-03-08 MED ORDER — BORTEZOMIB CHEMO SQ INJECTION 3.5 MG (2.5MG/ML)
1.5000 mg/m2 | Freq: Once | INTRAMUSCULAR | Status: AC
  Administered 2024-03-08: 2.25 mg via SUBCUTANEOUS
  Filled 2024-03-08: qty 0.9

## 2024-03-08 MED ORDER — ACETAMINOPHEN 325 MG PO TABS
650.0000 mg | ORAL_TABLET | Freq: Once | ORAL | Status: AC
  Administered 2024-03-08: 650 mg via ORAL
  Filled 2024-03-08: qty 2

## 2024-03-08 MED ORDER — DARATUMUMAB-HYALURONIDASE-FIHJ 1800-30000 MG-UT/15ML ~~LOC~~ SOLN
1800.0000 mg | Freq: Once | SUBCUTANEOUS | Status: AC
  Administered 2024-03-08: 1800 mg via SUBCUTANEOUS
  Filled 2024-03-08: qty 15

## 2024-03-08 MED ORDER — DEXAMETHASONE 4 MG PO TABS
20.0000 mg | ORAL_TABLET | Freq: Once | ORAL | Status: AC
Start: 2024-03-08 — End: 2024-03-08
  Administered 2024-03-08: 20 mg via ORAL
  Filled 2024-03-08: qty 5

## 2024-03-08 MED ORDER — DIPHENHYDRAMINE HCL 25 MG PO CAPS
25.0000 mg | ORAL_CAPSULE | Freq: Once | ORAL | Status: AC
  Administered 2024-03-08: 25 mg via ORAL
  Filled 2024-03-08: qty 1

## 2024-03-08 NOTE — Progress Notes (Signed)
 Patient seen by Dr. Addison Naegeli are within treatment parameters.  Labs reviewed: and are within treatment parameters.  Per physician team, patient is ready for treatment and there are NO modifications to the treatment plan.

## 2024-03-08 NOTE — Progress Notes (Signed)
 Pt observed for one hour after Dara Faspro injection. VSS. No complaints at time of discharge. Injections sites clean, dry and intact at time of discharge.

## 2024-03-08 NOTE — Patient Instructions (Signed)
 [facility] - A DEPT OF [facility]  Discharge Instructions: Thank you for choosing [facility] to provide your oncology and hematology care.   If you have a lab appointment with the Cancer Center, please go directly to the Cancer Center and check in at the registration area.   Wear comfortable clothing and clothing appropriate for easy access to any Portacath or PICC line.   We strive to give you quality time with your provider. You may need to reschedule your appointment if you arrive late (15 or more minutes).  Arriving late affects you and other patients whose appointments are after yours.  Also, if you miss three or more appointments without notifying the office, you may be dismissed from the clinic at the provider's discretion.      For prescription refill requests, have your pharmacy contact our office and allow 72 hours for refills to be completed.    Today you received the following chemotherapy and/or immunotherapy agents: Velcade, Dara Faspro.       To help prevent nausea and vomiting after your treatment, we encourage you to take your nausea medication as directed.  BELOW ARE SYMPTOMS THAT SHOULD BE REPORTED IMMEDIATELY: *FEVER GREATER THAN 100.4 F (38 C) OR HIGHER *CHILLS OR SWEATING *NAUSEA AND VOMITING THAT IS NOT CONTROLLED WITH YOUR NAUSEA MEDICATION *UNUSUAL SHORTNESS OF BREATH *UNUSUAL BRUISING OR BLEEDING *URINARY PROBLEMS (pain or burning when urinating, or frequent urination) *BOWEL PROBLEMS (unusual diarrhea, constipation, pain near the anus) TENDERNESS IN MOUTH AND THROAT WITH OR WITHOUT PRESENCE OF ULCERS (sore throat, sores in mouth, or a toothache) UNUSUAL RASH, SWELLING OR PAIN  UNUSUAL VAGINAL DISCHARGE OR ITCHING   Items with * indicate a potential emergency and should be followed up as soon as possible or go to the Emergency Department if any problems should occur.  Please show the CHEMOTHERAPY ALERT CARD or  IMMUNOTHERAPY ALERT CARD at check-in to the Emergency Department and triage nurse.  Should you have questions after your visit or need to cancel or reschedule your appointment, please contact [facility] - A DEPT OF [facility]  Dept: [phone]  and follow the prompts.  Office hours are 8:00 a.m. to 4:30 p.m. Monday - Friday. Please note that voicemails left after 4:00 p.m. may not be returned until the following business day.  We are closed weekends and major holidays. You have access to a nurse at all times for urgent questions. Please call the main number to the clinic Dept: [phone] and follow the prompts.   For any non-urgent questions, you may also contact your provider using MyChart. We now offer e-Visits for anyone 75 and older to request care online for non-urgent symptoms. For details visit [email or web address].   Also download the MyChart app! Go to the app store, search "MyChart", open the app, select , and log in with your MyChart username and password.

## 2024-03-08 NOTE — Progress Notes (Signed)
 Per Candise Che MD, ok to treat with SCR 1.78 and elevated LFTs

## 2024-03-08 NOTE — Progress Notes (Signed)
 HEMATOLOGY/ONCOLOGY CONSULTATION NOTE  Date of Service: 02/04/2024  Patient Care Team: Zola Button, Grayling Congress, DO as PCP - General Wendall Stade, MD as PCP - Cardiology (Cardiology) Mckinley Jewel, MD as Consulting Physician (Ophthalmology) Hart Carwin, MD (Inactive) (Gastroenterology) Kathryne Hitch, MD as Consulting Physician (Orthopedic Surgery) Cherlyn Roberts, MD as Referring Physician (Dermatology) Genia Del, MD as Consulting Physician (Obstetrics and Gynecology) Johney Maine, MD as Consulting Physician (Hematology)  CHIEF COMPLAINTS/PURPOSE OF CONSULTATION:  Evaluation and management of newly diagnosed Multiple myeloma   HISTORY OF PRESENTING ILLNESS:   Cynthia Burns is a wonderful 78 y.o. female who was scheduled to see Dr. Kalman Drape as a new patient and was referred to the ED for evaluation and management of newly diagnosed myeloma with rapidly worsening hgb and renal insuffiencey with proteinuria. High suspicious of Multiple myeloma.    Outside labs done at Dr Willette Pa Singh's office (available in referral under media) show M spike of 6.1g/dl and Kappa light chains in the 400's.   Patient is accompanied by her husband and her daughter at bed side during the visit. Patient notes she has been having significantly more fatigue. Has required PRBC transfusions for symptomatic anemia.   She complains of fatigue, right ankle pain, and unexpected weight loss of around 25-30 lbs due to appetite loss in the past year. Her daughter notes that the patient has been confused more often as well.    She denies any new infection issues, fever, chills, back pain, chest pain, abdominal pain, or leg swelling.    Patient notes she tested positive for COVID-19 infection in August 2022 and was prescribed Paxlovid. However, she did not tolerate the medication due to allergic reaction.   PmHx of hypertension. She has been taking Losartan and hydralazine.    Surgery history: Hysterectomy and Cholecystectomy.    She was started on dexamethasone 20 mg daily x 4 doses yesterday. She has been tolerating the high-dose steroid well.   INTERVAL HISTORY:  Cynthia Burns is a 78 y.o. female here for evaluation and management of newly diagnosed myeloma with rapidly worsening hgb and renal insufficiency with proteinuria. She is here for cycle 1 day 8 of her treatment.   Patient was last seen by me as in-patient on 03/02/2024 and she was doing well overall. She was hospitalized from 02/29/2024 to 03/02/2024.   Patient is accompanied by her husband and her daughter during this visit. Patient notes she has been doing well overall after her discharge from the hospital.   She denies any new infection issues, fever, chills, night sweats, unexpected weight loss, back pain, chest pain, abdominal pain, or leg swelling. She denies dizziness/light-headiness.   Patient's husband notes that her blood pressure have been in the normal range at home. During this visit, her blood pressure is 114/53 with pulse rate of 73.   Patient has been tolerating his first cycle well overall.   She has been staying well-hydrated and has been eating well.   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   HSV-1 infection    HYPERTENSION    Hypothyroid  MITRAL VALVE PROLAPSE    OSTEOPENIA    Overweight(278.02)    PHARYNGITIS, ACUTE     SURGICAL HISTORY: Past Surgical History:  Procedure Laterality Date   ABDOMINAL HYSTERECTOMY     CHOLECYSTECTOMY     prolped bladder     WISDOM TOOTH EXTRACTION      SOCIAL HISTORY: Social History   Socioeconomic History   Marital status: Married     Spouse name: Not on file   Number of children: Not on file   Years of education: Not on file   Highest education level: Associate degree: occupational, Scientist, product/process development, or vocational program  Occupational History   Occupation: hairdresser  Tobacco Use   Smoking status: Never   Smokeless tobacco: Never  Vaping Use   Vaping status: Never Used  Substance and Sexual Activity   Alcohol use: No    Alcohol/week: 0.0 standard drinks of alcohol   Drug use: No   Sexual activity: Not Currently    Partners: Male    Birth control/protection: Surgical, Abstinence    Comment: hysterectomy, 17, less than 5  Other Topics Concern   Not on file  Social History Narrative   Not on file   Social Drivers of Health   Financial Resource Strain: Low Risk  (12/07/2023)   Overall Financial Resource Strain (CARDIA)    Difficulty of Paying Living Expenses: Not hard at all  Food Insecurity: Patient Declined (02/29/2024)   Hunger Vital Sign    Worried About Running Out of Food in the Last Year: Patient declined    Ran Out of Food in the Last Year: Patient declined  Transportation Needs: No Transportation Needs (02/29/2024)   PRAPARE - Administrator, Civil Service (Medical): No    Lack of Transportation (Non-Medical): No  Physical Activity: Insufficiently Active (12/07/2023)   Exercise Vital Sign    Days of Exercise per Week: 1 day    Minutes of Exercise per Session: 10 min  Stress: No Stress Concern Present (12/07/2023)   [employer] of Occupational Health - Occupational Stress Questionnaire    Feeling of Stress : Only a little  Social Connections: Socially Integrated (02/29/2024)   Social Connection and Isolation Panel [NHANES]    Frequency of Communication with Friends and Family: More than three times a week    Frequency of Social Gatherings with Friends and Family: More than three times a week    Attends Religious Services: More than 4 times per year    Active Member of [employer] or  Organizations: Yes    Attends Engineer, structural: More than 4 times per year    Marital Status: Married  Catering manager Violence: Not At Risk (02/29/2024)   Humiliation, Afraid, Rape, and Kick questionnaire    Fear of Current or Ex-Partner: No    Emotionally Abused: No    Physically Abused: No    Sexually Abused: No    FAMILY HISTORY: Family History  Problem Relation Age of Onset   Hypertension Mother    Heart disease Mother        Had a stent   Emphysema Father    Diabetes Father    COPD Father    Stroke Sister    Breast cancer Neg Hx     ALLERGIES:  is allergic to mucinex [guaifenesin er], bystolic [nebivolol hcl], calcium-containing compounds, codeine, paxlovid [nirmatrelvir-ritonavir], advicor [niacin-lovastatin er], amlodipine, lisinopril-hydrochlorothiazide, other, ramipril, and sulfonamide derivatives.  MEDICATIONS:  Current Outpatient Medications  Medication Sig Dispense Refill   acyclovir (  ZOVIRAX) 400 MG tablet Take 1 tablet (400 mg total) by mouth 2 (two) times daily. 60 tablet 5   amoxicillin (AMOXIL) 500 MG capsule Take 2,000 mg by mouth See admin instructions. Take 2,000 mg by mouth one hour prior to dental visits     Biotin 5000 MCG CAPS Take 5,000 mcg by mouth daily.     Blood Glucose Monitoring Suppl (ONE TOUCH ULTRA 2) w/Device KIT CHECK BLOOD SUGAR TWICE DAILY.  DX CODE  E11.9 1 kit 0   Cholecalciferol (VITAMIN D3) 1000 units CAPS Take 1,000 Units by mouth daily.     clonazePAM (KLONOPIN) 0.5 MG tablet Take 1 tablet (0.5 mg total) by mouth 2 (two) times daily as needed for anxiety. 30 tablet 0   Cyanocobalamin (VITAMIN B-12) 3000 MCG SUBL Place 3,000 mcg under the tongue daily in the afternoon.     dexamethasone (DECADRON) 4 MG tablet Take 5 tablets (20 mg) by mouth with breakfast the day after every daratumumab dose 30 tablet 1   glucose blood (ONETOUCH ULTRA TEST) test strip Use as instructed 200 each 3   [Paused] hydrALAZINE (APRESOLINE) 25 MG  tablet Take 25 mg by mouth See admin instructions. Take 25 mg by mouth at 8 AM, 3 PM, and 11 PM- in conjunction with one 50 mg tablet to equal a total dose of 75 mg     insulin aspart (NOVOLOG FLEXPEN) 100 UNIT/ML FlexPen Per sliding scale max 40 u daily (Patient taking differently: 2-8 Units See admin instructions. Inject 2-8 units into the skin three times a day with meals, PER SLIDING SCALE) 15 mL 11   Insulin Pen Needle (PEN NEEDLES) 31G X 6 MM MISC As directed 100 each 0   Lancets (ONETOUCH DELICA PLUS LANCET33G) MISC USE 1 LANCET TO TEST AS DIRECTED (DISCARD LANCET IN SHARPS CONTAINER IMMEDIATELY AFTER USE) 200 each 1   levothyroxine (SYNTHROID) 25 MCG tablet TAKE ONE TABLET BY MOUTH EVERY DAY BEFORE BREAKFAST (Patient taking differently: Take 25 mcg by mouth daily before breakfast.) 90 tablet 1   Multiple Vitamins-Minerals (PRESERVISION AREDS 2) CAPS Take 1 capsule by mouth 2 (two) times daily with a meal.     ondansetron (ZOFRAN) 8 MG tablet Take 8 mg by mouth 30 to 60 min prior to Cyclophosphamide administration then take 8 mg every 8 hrs as needed for nausea and vomiting. 30 tablet 1   pantoprazole (PROTONIX) 40 MG tablet Pantoprazole 40 mg before breakfast and dinner for 90 days with 3 refills (Patient taking differently: Take 40 mg by mouth See admin instructions. Take 40 mg by mouth 30 minutes before lunch and supper) 180 tablet 3   prochlorperazine (COMPAZINE) 10 MG tablet Take 1 tablet (10 mg total) by mouth every 6 (six) hours as needed for nausea or vomiting. 30 tablet 1   rosuvastatin (CRESTOR) 20 MG tablet TAKE ONE TABLET BY MOUTH EVERY DAY 90 tablet 3   Semaglutide (RYBELSUS) 7 MG TABS Take 1 tablet (7 mg total) by mouth daily. 90 tablet 0   SYSTANE ULTRA PF 0.4-0.3 % SOLN Place 1 drop into both eyes See admin instructions. Instill 1 drop into both eyes one to four times a day     UNKNOWN TO PATIENT Place 1 spray into both nostrils See admin instructions. Unnamed nasal emollient-  Instill 1 spray into each nostril one to two times a day as needed for dry nasal passages     valsartan (DIOVAN) 160 MG tablet Take 1 tablet (160 mg total) by  mouth daily. 30 tablet 3   No current facility-administered medications for this visit.    REVIEW OF SYSTEMS:    10 Point review of Systems was done is negative except as noted above.  PHYSICAL EXAMINATION:  ECOG PERFORMANCE STATUS: 2 - Symptomatic, <50% confined to bed  Vitals:   03/08/24 0853  BP: (!) 114/53  Pulse: 73  Resp: 17  Temp: 97.7 F (36.5 C)  SpO2: 100%   Filed Weights   03/08/24 0853  Weight: 109 lb 14.4 oz (49.9 kg)   .Body mass index is 20.77 kg/m.  GENERAL:alert, in no acute distress and comfortable SKIN: no acute rashes, no significant lesions EYES: conjunctiva are pink and non-injected, sclera anicteric OROPHARYNX: MMM, no exudates, no oropharyngeal erythema or ulceration NECK: supple, no JVD LYMPH:  no palpable lymphadenopathy in the cervical, axillary or inguinal regions. LUNGS: clear to auscultation b/l with normal respiratory effort HEART: regular rate & rhythm ABDOMEN:  normoactive bowel sounds , non tender, not distended. Extremity: no pedal edema PSYCH: alert & oriented x 3 with fluent speech NEURO: no focal motor/sensory deficits  LABORATORY DATA:  I have reviewed the data as listed  .    Latest Ref Rng & Units 03/08/2024    8:21 AM 03/02/2024    6:03 AM 03/01/2024    5:31 AM  CBC  WBC 4.0 - 10.5 K/uL 7.5  5.2  6.3   Hemoglobin 12.0 - 15.0 g/dL 9.4  8.4  7.4   Hematocrit 36.0 - 46.0 % 28.5  25.5  23.0   Platelets 150 - 400 K/uL 170  183  196        Latest Ref Rng & Units 03/08/2024    8:21 AM 03/02/2024    6:03 AM 03/01/2024    5:31 AM  CMP  Glucose 70 - 99 mg/dL 161  92  096   BUN 8 - 23 mg/dL 88  60  86   Creatinine 0.44 - 1.00 mg/dL 0.45  4.09  8.11   Sodium 135 - 145 mmol/L 128  130  130   Potassium 3.5 - 5.1 mmol/L 3.7  3.5  3.7   Chloride 98 - 111 mmol/L 104  108   112   CO2 22 - 32 mmol/L 22  19  17    Calcium 8.9 - 10.3 mg/dL 8.7  8.1  8.2   Total Protein 6.5 - 8.1 g/dL >91.4  78.2    Total Bilirubin 0.0 - 1.2 mg/dL 0.3  0.6    Alkaline Phos 38 - 126 U/L 37  26    AST 15 - 41 U/L 57  17    ALT 0 - 44 U/L 83  30     Bone marrow biopsy 02/01/2024:    Surgical pathology 01/29/2024:    RADIOGRAPHIC STUDIES: I have personally reviewed the radiological images as listed and agreed with the findings in the report. ECHOCARDIOGRAM COMPLETE Result Date: 03/01/2024    ECHOCARDIOGRAM REPORT   Patient Name:   CARISHA KANTOR Date of Exam: 03/01/2024 Medical Rec #:  [id number]       Height:       61.0 in Accession #:    [id number]      Weight:       116.2 lb Date of Birth:  November 07, 1946      BSA:          1.500 m Patient Age:    77 years        BP:  165/95 mmHg Patient Gender: F               HR:           79 bpm. Exam Location:  Inpatient Procedure: 2D Echo, Cardiac Doppler and Color Doppler (Both Spectral and Color            Flow Doppler were utilized during procedure). Indications:    Syncope  History:        Patient has prior history of Echocardiogram examinations, most                 recent 11/24/2018. Risk Factors:Hypertension, Diabetes and                 Dyslipidemia. CKD.  Sonographer:    Vern Claude Referring Phys: [id number] HERSH GOEL IMPRESSIONS  1. Left ventricular ejection fraction, by estimation, is 60 to 65%. The left ventricle has normal function. The left ventricle has no regional wall motion abnormalities. Left ventricular diastolic parameters are consistent with Grade I diastolic dysfunction (impaired relaxation).  2. Right ventricular systolic function is normal. The right ventricular size is normal. Tricuspid regurgitation signal is inadequate for assessing PA pressure.  3. The mitral valve is normal in structure. No evidence of mitral valve regurgitation. No evidence of mitral stenosis.  4. The aortic valve is tricuspid. Aortic valve  regurgitation is trivial. No aortic stenosis is present.  5. Aortic dilatation noted. There is mild dilatation of the aortic root, measuring 39 mm.  6. The inferior vena cava is normal in size with greater than 50% respiratory variability, suggesting right atrial pressure of 3 mmHg. FINDINGS  Left Ventricle: Left ventricular ejection fraction, by estimation, is 60 to 65%. The left ventricle has normal function. The left ventricle has no regional wall motion abnormalities. The left ventricular internal cavity size was normal in size. There is  no left ventricular hypertrophy. Left ventricular diastolic parameters are consistent with Grade I diastolic dysfunction (impaired relaxation). Right Ventricle: The right ventricular size is normal. No increase in right ventricular wall thickness. Right ventricular systolic function is normal. Tricuspid regurgitation signal is inadequate for assessing PA pressure. Left Atrium: Left atrial size was normal in size. Right Atrium: Right atrial size was normal in size. Pericardium: There is no evidence of pericardial effusion. Mitral Valve: The mitral valve is normal in structure. No evidence of mitral valve regurgitation. No evidence of mitral valve stenosis. MV peak gradient, 10.6 mmHg. The mean mitral valve gradient is 5.0 mmHg. Tricuspid Valve: The tricuspid valve is normal in structure. Tricuspid valve regurgitation is not demonstrated. Aortic Valve: The aortic valve is tricuspid. Aortic valve regurgitation is trivial. No aortic stenosis is present. Aortic valve mean gradient measures 4.0 mmHg. Aortic valve peak gradient measures 6.7 mmHg. Aortic valve area, by VTI measures 1.95 cm. Pulmonic Valve: The pulmonic valve was normal in structure. Pulmonic valve regurgitation is not visualized. Aorta: Aortic dilatation noted. There is mild dilatation of the aortic root, measuring 39 mm. Venous: The inferior vena cava is normal in size with greater than 50% respiratory variability,  suggesting right atrial pressure of 3 mmHg. IAS/Shunts: No atrial level shunt detected by color flow Doppler.  LEFT VENTRICLE PLAX 2D LVIDd:         4.00 cm      Diastology LVIDs:         2.60 cm      LV e' medial:    6.31 cm/s LV PW:  0.70 cm      LV E/e' medial:  10.6 LV IVS:        0.90 cm      LV e' lateral:   8.70 cm/s LVOT diam:     1.70 cm      LV E/e' lateral: 7.7 LV SV:         47 LV SV Index:   32 LVOT Area:     2.27 cm  LV Volumes (MOD) LV vol d, MOD A2C: 110.0 ml LV vol d, MOD A4C: 117.0 ml LV vol s, MOD A2C: 34.6 ml LV vol s, MOD A4C: 36.4 ml LV SV MOD A2C:     75.4 ml LV SV MOD A4C:     117.0 ml LV SV MOD BP:      78.3 ml RIGHT VENTRICLE             IVC RV Basal diam:  2.60 cm     IVC diam: 1.20 cm RV Mid diam:    1.60 cm RV S prime:     10.20 cm/s TAPSE (M-mode): 1.8 cm LEFT ATRIUM             Index        RIGHT ATRIUM          Index LA diam:        2.90 cm 1.93 cm/m   RA Area:     5.86 cm LA Vol (A2C):   34.4 ml 22.94 ml/m  RA Volume:   6.04 ml  4.03 ml/m LA Vol (A4C):   27.4 ml 18.27 ml/m LA Biplane Vol: 32.6 ml 21.74 ml/m  AORTIC VALVE                    PULMONIC VALVE AV Area (Vmax):    1.60 cm     PV Vmax:       0.90 m/s AV Area (Vmean):   1.63 cm     PV Peak grad:  3.3 mmHg AV Area (VTI):     1.95 cm AV Vmax:           129.00 cm/s AV Vmean:          88.300 cm/s AV VTI:            0.243 m AV Peak Grad:      6.7 mmHg AV Mean Grad:      4.0 mmHg LVOT Vmax:         90.70 cm/s LVOT Vmean:        63.300 cm/s LVOT VTI:          0.209 m LVOT/AV VTI ratio: 0.86  AORTA Ao Root diam: 3.90 cm MITRAL VALVE MV Area (PHT): 3.42 cm     SHUNTS MV Area VTI:   1.41 cm     Systemic VTI:  0.21 m MV Peak grad:  10.6 mmHg    Systemic Diam: 1.70 cm MV Mean grad:  5.0 mmHg MV Vmax:       1.63 m/s MV Vmean:      102.0 cm/s MV Decel Time: 222 msec MV E velocity: 66.70 cm/s MV A velocity: 112.00 cm/s MV E/A ratio:  0.60 Dalton McleanMD Electronically signed by Wilfred Lacy Signature Date/Time:  03/01/2024/2:46:46 PM    Final    DG Chest Port 1 View Result Date: 02/29/2024 CLINICAL DATA:  Shortness of breath. EXAM: PORTABLE CHEST 1 VIEW COMPARISON:  None Available. FINDINGS: Low lung volume. Bilateral lung fields are clear.  No dense consolidation or lung collapse. No frank pulmonary edema. Bilateral costophrenic angles are clear. There is elevated right hemidiaphragm. Normal cardio-mediastinal silhouette. No acute osseous abnormalities. The soft tissues are within normal limits. IMPRESSION: No active disease. Electronically Signed   By: Jules Schick M.D.   On: 02/29/2024 13:59   NM PET Image Initial (PI) Whole Body Result Date: 02/29/2024 CLINICAL DATA:  Initial treatment strategy for multiple myeloma. EXAM: NUCLEAR MEDICINE PET WHOLE BODY TECHNIQUE: 5.8 mCi F-18 FDG was injected intravenously. Full-ring PET imaging was performed from the head to foot after the radiotracer. CT data was obtained and used for attenuation correction and anatomic localization. Fasting blood glucose: 120 mg/dl COMPARISON:  CT abdomen pelvis 09/03/2016. FINDINGS: Mediastinal blood pool activity: SUV max 2.5 HEAD/NECK: No abnormal hypermetabolism. Incidental CT findings: None. CHEST: No abnormal hypermetabolism. Incidental CT findings: Atherosclerotic calcification of the aorta. Heart is at the upper limits of normal in size to mildly enlarged. No pericardial or pleural effusion. ABDOMEN/PELVIS: No abnormal hypermetabolism. Incidental CT findings: None. SKELETON: Mild patchy uptake within the visualized osseous structures without focality. Muscular uptake superior to the left iliac wing (4/125), SUV max 3.4. Otherwise, no additional abnormal hypermetabolism. Incidental CT findings: Minimal degenerative change in the spine. Lytic lesion in the upper thoracic spine (4/47) without abnormal hypermetabolism. IMPRESSION: 1. No evidence of metabolically active multiple myeloma. 2. Muscular uptake superior to the left iliac wing.  Question recent injury. 3.  Aortic atherosclerosis (ICD10-I70.0). Electronically Signed   By: Leanna Battles M.D.   On: 02/29/2024 13:40   CT Head Wo Contrast Result Date: 02/29/2024 CLINICAL DATA:  Syncope/presyncope EXAM: CT HEAD WITHOUT CONTRAST TECHNIQUE: Contiguous axial images were obtained from the base of the skull through the vertex without intravenous contrast. RADIATION DOSE REDUCTION: This exam was performed according to the departmental dose-optimization program which includes automated exposure control, adjustment of the mA and/or kV according to patient size and/or use of iterative reconstruction technique. COMPARISON:  None Available. FINDINGS: Brain: No evidence of accelerated brain volume loss. No sign of old or acute focal infarction, mass lesion, hemorrhage, hydrocephalus or extra-axial collection. Vascular: Very minimal atherosclerotic calcification of the major vessels at the base of the brain. Skull: Negative Sinuses/Orbits: Clear/normal Other: None IMPRESSION: No acute or traumatic finding. Very minimal atherosclerotic calcification of the major vessels at the base of the brain. Electronically Signed   By: Paulina Fusi M.D.   On: 02/29/2024 11:34    ASSESSMENT & PLAN:  78 y.o. female with:  Newly diagnosed Multiple myeloma - Lambda restricted plasma cell neoplasm comprising greater than 75% of  the cellular marrow 2. Acute on CKD 3. HTN 4. DM2 5. Hypothyroidism  Patient Active Problem List   Diagnosis Date Noted   Symptomatic anemia 01/29/2024   Multiple myeloma (HCC) 01/29/2024   Acute renal failure superimposed on stage 3b chronic kidney disease (HCC) 01/29/2024   Anemia 11/26/2023   Hypothyroidism 02/23/2023   Type 2 diabetes mellitus with hyperglycemia, without long-term current use of insulin (HCC) 07/26/2021   Impacted cerumen of right ear 07/06/2020   Type 2 diabetes mellitus with hyperglycemia (HCC) 04/11/2019   Hyperlipidemia LDL goal <70 03/23/2017    Abdominal pain, epigastric 09/03/2016   Strain of right biceps 03/17/2016   Right shoulder pain 03/17/2016   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   Plantar wart of right foot 03/15/2015   Uncontrolled type 2 diabetes mellitus with hyperglycemia (HCC) 09/16/2013   Elevated lipids 09/05/2013   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  OVERWEIGHT 02/07/2009   PHARYNGITIS, ACUTE 06/03/2007   Essential hypertension 05/18/2007   MITRAL VALVE PROLAPSE 05/18/2007   OSTEOPENIA 05/18/2007   6. Hyponatremia (from 02/23/2023) chronic. Recent thyroid function tests WNL. Could be pseudohyponatremia from paraproteinemia. 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 PLAN: -Discussed lab results from today, 03/08/2024, in detail with the patient. CBC shows low but improved Hgb of 9.4 g/dL with Hct of 29.5%.  CMP reviewed -Patient has been tolerating her first cycle of her treatment well.  -Patient can proceed with cycle 1 day 8 of her treatment today without any dose modifications. -Discussed the option of getting referred to nutrition therapy. Pt agrees.  -Will refer the patient to nutrition therapy.  -Answered all of patient's questions.   FOLLOW-UP: Nutritional therapy referral for diet planning PLz schedule C1,C2 and C3 of treatment per integrated scheduling. MD visit in 2 weeks  The total time spent in the appointment was 30 minutes* .  All of the patient's questions were answered with apparent satisfaction. The patient knows to call the clinic with any problems, questions or concerns.   Wyvonnia Lora MD MS AAHIVMS [facility] Hematology/Oncology Physician [facility]  .*Total Encounter Time as defined by the Centers for Medicare and Medicaid Services includes, in addition to the face-to-face time of a patient visit (documented in the note above) non-face-to-face time: obtaining and reviewing outside history, ordering and reviewing medications, tests  or procedures, care coordination (communications with other health care professionals or caregivers) and documentation in the medical record.  I,Param Shah,acting as a Neurosurgeon for Wyvonnia Lora, MD.,have documented all relevant documentation on the behalf of Wyvonnia Lora, MD,as directed by  Wyvonnia Lora, MD while in the presence of Wyvonnia Lora, MD.  .I have reviewed the above documentation for accuracy and completeness, and I agree with the above. Johney Maine MD

## 2024-03-09 ENCOUNTER — Other Ambulatory Visit: Payer: Self-pay

## 2024-03-09 ENCOUNTER — Encounter: Payer: Self-pay | Admitting: Gastroenterology

## 2024-03-14 ENCOUNTER — Encounter: Payer: Self-pay | Admitting: Hematology

## 2024-03-14 ENCOUNTER — Other Ambulatory Visit: Payer: Self-pay

## 2024-03-14 ENCOUNTER — Ambulatory Visit: Payer: Self-pay

## 2024-03-14 DIAGNOSIS — C9000 Multiple myeloma not having achieved remission: Secondary | ICD-10-CM

## 2024-03-14 NOTE — Telephone Encounter (Signed)
 Patient's husband called in, reporting that he has 'held' patient's Valsartan 160 mg since Saturday due to low BP. Patient was hospitalized in early March and had medication changes from the hospitalist. Patient's new medication list (formulated in hospital) is below - reported from patient over the phone. Patient states her BP was dropping low in the mornings and would like Dr. Laury Axon to review the notes below of her recent BP readings. Patient's last dose of Valsartan 160 mg was on Saturday morning. Patient was recently diagnosed with Multiple Myeloma and is being treated with chemo, but was told to follow up with PCP for medication management. Patient does not want to come into the office due to immune system weakness but would like return call from Dr. Laury Axon to discuss earliest they could do Virtual Visit to go over medication management. Please note patient is also in kidney failure and is seeing nephrologist, Dr. Thedore Mins. Please call patient back @ [phone].   Patient would like the following items noted. BP Readings:  03/13/24: 1030 99/51 1110 99/50 1115 111/59 Last night 120/63  03/14/24: This morning 116/64 Just now 156/74 (husband reports patient is anxious because she just dropped casserole dish)  Patient's weight this morning: 105.4 lb  Patient's updated medication list: 0800 -  Pantoprazole 40 mg Valsartan 160 mg Acyclovir 400 mg Crestor 20 mg Preservisions Areds Drops   1130  Rybelsus 7 mg  1200 Vitamin D Vitamin B12 Biotin  1800 Acyclovir 400 mg Pantoprazole 40 mg Preservisions Areds Drops    Copied from CRM #[id number]. Topic: Clinical - Red Word Triage >> Mar 14, 2024  2:47 PM Almira Coaster wrote: Red Word that prompted transfer to Nurse Triage: blood pressure running low due to increase of of her valsartan (DIOVAN) 160 MG tablet, she was admitted due to a blood pressure reading of 90/40. Patient's husband decided to discontinue medication until he spoke with a  nurse. Reason for Disposition  [1] Follow-up call from patient regarding patient's clinical status AND [2] information NON-URGENT  Answer Assessment - Initial Assessment Questions 1. REASON FOR CALL or QUESTION: "What is your reason for calling today?" or "How can I best help you?" or "What question do you have that I can help answer?"     Please see notes 2. CALLER: Document the source of call. (e.g., laboratory, patient).     Patient and husband  Protocols used: PCP Call - No Triage-A-AH

## 2024-03-15 ENCOUNTER — Inpatient Hospital Stay: Payer: Medicare Other

## 2024-03-15 ENCOUNTER — Other Ambulatory Visit: Payer: Self-pay | Admitting: Hematology

## 2024-03-15 ENCOUNTER — Encounter: Payer: Self-pay | Admitting: Hematology

## 2024-03-15 ENCOUNTER — Ambulatory Visit (INDEPENDENT_AMBULATORY_CARE_PROVIDER_SITE_OTHER): Payer: Medicare Other

## 2024-03-15 ENCOUNTER — Ambulatory Visit (INDEPENDENT_AMBULATORY_CARE_PROVIDER_SITE_OTHER): Payer: Medicare Other | Admitting: Audiology

## 2024-03-15 VITALS — BP 122/66 | HR 82 | Temp 97.6°F | Resp 16

## 2024-03-15 DIAGNOSIS — C9000 Multiple myeloma not having achieved remission: Secondary | ICD-10-CM | POA: Diagnosis not present

## 2024-03-15 DIAGNOSIS — Z5112 Encounter for antineoplastic immunotherapy: Secondary | ICD-10-CM | POA: Diagnosis not present

## 2024-03-15 DIAGNOSIS — Z79899 Other long term (current) drug therapy: Secondary | ICD-10-CM | POA: Diagnosis not present

## 2024-03-15 DIAGNOSIS — Z79620 Long term (current) use of immunosuppressive biologic: Secondary | ICD-10-CM | POA: Diagnosis not present

## 2024-03-15 LAB — CMP (CANCER CENTER ONLY)
ALT: 71 U/L — ABNORMAL HIGH (ref 0–44)
AST: 35 U/L (ref 15–41)
Albumin: 3.2 g/dL — ABNORMAL LOW (ref 3.5–5.0)
Alkaline Phosphatase: 42 U/L (ref 38–126)
Anion gap: 3 — ABNORMAL LOW (ref 5–15)
BUN: 110 mg/dL — ABNORMAL HIGH (ref 8–23)
CO2: 21 mmol/L — ABNORMAL LOW (ref 22–32)
Calcium: 8.9 mg/dL (ref 8.9–10.3)
Chloride: 101 mmol/L (ref 98–111)
Creatinine: 1.89 mg/dL — ABNORMAL HIGH (ref 0.44–1.00)
GFR, Estimated: 27 mL/min — ABNORMAL LOW (ref >60)
Glucose, Bld: 123 mg/dL — ABNORMAL HIGH (ref 70–99)
Potassium: 4.1 mmol/L (ref 3.5–5.1)
Sodium: 125 mmol/L — ABNORMAL LOW (ref 135–145)
Total Bilirubin: 0.3 mg/dL (ref 0.0–1.2)
Total Protein: 11.6 g/dL — ABNORMAL HIGH (ref 6.5–8.1)

## 2024-03-15 LAB — CBC WITH DIFFERENTIAL (CANCER CENTER ONLY)
Abs Immature Granulocytes: 0.02 10*3/uL (ref 0.00–0.07)
Basophils Absolute: 0 10*3/uL (ref 0.0–0.1)
Basophils Relative: 0 %
Eosinophils Absolute: 0.1 10*3/uL (ref 0.0–0.5)
Eosinophils Relative: 1 %
HCT: 29.6 % — ABNORMAL LOW (ref 36.0–46.0)
Hemoglobin: 10 g/dL — ABNORMAL LOW (ref 12.0–15.0)
Immature Granulocytes: 0 %
Lymphocytes Relative: 17 %
Lymphs Abs: 1.1 10*3/uL (ref 0.7–4.0)
MCH: 32.2 pg (ref 26.0–34.0)
MCHC: 33.8 g/dL (ref 30.0–36.0)
MCV: 95.2 fL (ref 80.0–100.0)
Monocytes Absolute: 0.5 10*3/uL (ref 0.1–1.0)
Monocytes Relative: 8 %
Neutro Abs: 4.9 10*3/uL (ref 1.7–7.7)
Neutrophils Relative %: 74 %
Platelet Count: 171 10*3/uL (ref 150–400)
RBC: 3.11 MIL/uL — ABNORMAL LOW (ref 3.87–5.11)
RDW: 16.5 % — ABNORMAL HIGH (ref 11.5–15.5)
WBC Count: 6.5 10*3/uL (ref 4.0–10.5)
nRBC: 0 % (ref 0.0–0.2)

## 2024-03-15 MED ORDER — MONTELUKAST SODIUM 10 MG PO TABS
10.0000 mg | ORAL_TABLET | Freq: Once | ORAL | Status: AC
Start: 2024-03-15 — End: 2024-03-15
  Administered 2024-03-15: 10 mg via ORAL
  Filled 2024-03-15: qty 1

## 2024-03-15 MED ORDER — DIPHENHYDRAMINE HCL 25 MG PO CAPS
25.0000 mg | ORAL_CAPSULE | Freq: Once | ORAL | Status: AC
Start: 2024-03-15 — End: 2024-03-15
  Administered 2024-03-15: 25 mg via ORAL
  Filled 2024-03-15: qty 1

## 2024-03-15 MED ORDER — ACETAMINOPHEN 325 MG PO TABS
650.0000 mg | ORAL_TABLET | Freq: Once | ORAL | Status: AC
  Administered 2024-03-15: 650 mg via ORAL
  Filled 2024-03-15: qty 2

## 2024-03-15 MED ORDER — DARATUMUMAB-HYALURONIDASE-FIHJ 1800-30000 MG-UT/15ML ~~LOC~~ SOLN
1800.0000 mg | Freq: Once | SUBCUTANEOUS | Status: AC
Start: 2024-03-15 — End: 2024-03-15
  Administered 2024-03-15: 1800 mg via SUBCUTANEOUS
  Filled 2024-03-15: qty 15

## 2024-03-15 MED ORDER — DEXAMETHASONE 4 MG PO TABS
20.0000 mg | ORAL_TABLET | Freq: Once | ORAL | Status: AC
  Administered 2024-03-15: 20 mg via ORAL
  Filled 2024-03-15: qty 5

## 2024-03-15 MED ORDER — BORTEZOMIB CHEMO SQ INJECTION 3.5 MG (2.5MG/ML)
1.5000 mg/m2 | Freq: Once | INTRAMUSCULAR | Status: AC
  Administered 2024-03-15: 2.25 mg via SUBCUTANEOUS
  Filled 2024-03-15: qty 0.9

## 2024-03-15 NOTE — Patient Instructions (Signed)
 [facility]  Discharge Instructions: Thank you for choosing [facility] to provide your oncology and hematology care.   If you have a lab appointment with the Cancer Center, please go directly to the Cancer Center and check in at the registration area.   Wear comfortable clothing and clothing appropriate for easy access to any Portacath or PICC line.   We strive to give you quality time with your provider. You may need to reschedule your appointment if you arrive late (15 or more minutes).  Arriving late affects you and other patients whose appointments are after yours.  Also, if you miss three or more appointments without notifying the office, you may be dismissed from the clinic at the provider's discretion.      For prescription refill requests, have your pharmacy contact our office and allow 72 hours for refills to be completed.    Today you received the following chemotherapy and/or immunotherapy agents velcade, darzalex faspro      To help prevent nausea and vomiting after your treatment, we encourage you to take your nausea medication as directed.  BELOW ARE SYMPTOMS THAT SHOULD BE REPORTED IMMEDIATELY: *FEVER GREATER THAN 100.4 F (38 C) OR HIGHER *CHILLS OR SWEATING *NAUSEA AND VOMITING THAT IS NOT CONTROLLED WITH YOUR NAUSEA MEDICATION *UNUSUAL SHORTNESS OF BREATH *UNUSUAL BRUISING OR BLEEDING *URINARY PROBLEMS (pain or burning when urinating, or frequent urination) *BOWEL PROBLEMS (unusual diarrhea, constipation, pain near the anus) TENDERNESS IN MOUTH AND THROAT WITH OR WITHOUT PRESENCE OF ULCERS (sore throat, sores in mouth, or a toothache) UNUSUAL RASH, SWELLING OR PAIN  UNUSUAL VAGINAL DISCHARGE OR ITCHING   Items with * indicate a potential emergency and should be followed up as soon as possible or go to the Emergency Department if any problems should occur.  Please show the CHEMOTHERAPY ALERT CARD or  IMMUNOTHERAPY ALERT CARD at check-in to the Emergency Department and triage nurse.  Should you have questions after your visit or need to cancel or reschedule your appointment, please contact [facility] - A [facility]  Dept: [phone]  and follow the prompts.  Office hours are 8:00 a.m. to 4:30 p.m. Monday - Friday. Please note that voicemails left after 4:00 p.m. may not be returned until the following business day.  We are closed weekends and major holidays. You have access to a nurse at all times for urgent questions. Please call the main number to the clinic Dept: [phone] and follow the prompts.   For any non-urgent questions, you may also contact your provider using MyChart. We now offer e-Visits for anyone 56 and older to request care online for non-urgent symptoms. For details visit [email or web address].   Also download the MyChart app! Go to the app store, search "MyChart", open the app, select [place], and log in with your MyChart username and password.

## 2024-03-15 NOTE — Progress Notes (Signed)
Per Dr. Kale, ok to treat with elevated creatinine.  

## 2024-03-16 NOTE — Telephone Encounter (Signed)
 Pt called. LDVM but also advised to call back with blood pressures

## 2024-03-17 ENCOUNTER — Other Ambulatory Visit: Payer: Self-pay

## 2024-03-21 ENCOUNTER — Ambulatory Visit: Admitting: Family Medicine

## 2024-03-21 ENCOUNTER — Telehealth (INDEPENDENT_AMBULATORY_CARE_PROVIDER_SITE_OTHER): Payer: Self-pay | Admitting: Family Medicine

## 2024-03-21 ENCOUNTER — Encounter: Payer: Self-pay | Admitting: Family Medicine

## 2024-03-21 VITALS — BP 137/72

## 2024-03-21 DIAGNOSIS — N1832 Chronic kidney disease, stage 3b: Secondary | ICD-10-CM | POA: Diagnosis not present

## 2024-03-21 DIAGNOSIS — I10 Essential (primary) hypertension: Secondary | ICD-10-CM | POA: Diagnosis not present

## 2024-03-21 DIAGNOSIS — Z7984 Long term (current) use of oral hypoglycemic drugs: Secondary | ICD-10-CM | POA: Diagnosis not present

## 2024-03-21 DIAGNOSIS — C9000 Multiple myeloma not having achieved remission: Secondary | ICD-10-CM | POA: Diagnosis not present

## 2024-03-21 DIAGNOSIS — E1165 Type 2 diabetes mellitus with hyperglycemia: Secondary | ICD-10-CM

## 2024-03-21 DIAGNOSIS — E039 Hypothyroidism, unspecified: Secondary | ICD-10-CM | POA: Diagnosis not present

## 2024-03-21 NOTE — Progress Notes (Signed)
 MyChart Video Visit   heather was able to get the patient set up on a video visit.  Patient location: home with husband Patient and provider in visit Provider location: Office  I discussed the limitations of evaluation and management by telemedicine and the availability of in person appointments. The patient expressed understanding and agreed to proceed.  Visit Date: 03/21/2024  Today's healthcare provider: Donato Schultz, DO     Subjective:    [name], female    DOB: July 25, 1946, 78 y.o.   MRN: [id number]  No chief complaint on file.   HPI Patient is in today for fluctuating bp.   Discussed the use of AI scribe software for clinical note transcription with the patient, who gave verbal consent to proceed.  History of Present Illness Cynthia Burns is a 78 year old female with hypertension and diabetes who presents for blood pressure management. She is accompanied by her caregiver, who assists with her medical management.  She has a history of hypertension and was recently hospitalized. Upon discharge, all blood pressure medications, including valsartan 160 mg, were stopped due to low blood pressure readings. Since then, her blood pressure has been monitored at home, showing fluctuations with readings ranging from 116/63 to 157/75 over the past week without medication. She feels well without the medication, but her caregiver is concerned about occasional higher readings.  She has a history of diabetes and is currently taking Rybelsus at noon. Her blood sugar levels have been stable, typically around 112 to 114 in the mornings, with occasional spikes up to 200, which are managed with a sliding scale insulin dose. Her blood sugar tends to rise when she receives steroids as part of her chemotherapy regimen.  She is undergoing chemotherapy and receives steroids, which cause anxiety, agitation, and sleep disturbances. She experiences constipation following her  chemotherapy and steroid treatment, for which she uses Colace. She is cautious about timing the Colace to avoid dizziness and nausea.  Her caregiver mentions that she is up to date on her vaccinations, including RSV, but has not received the COVID booster due to her ongoing chemotherapy treatment. She is concerned about exposure to illnesses due to her compromised immune system.    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   HSV-1 infection    HYPERTENSION    Hypothyroid    MITRAL VALVE PROLAPSE    OSTEOPENIA    Overweight(278.02)    PHARYNGITIS, ACUTE     Past Surgical History:  Procedure Laterality Date   ABDOMINAL HYSTERECTOMY     CHOLECYSTECTOMY     prolped bladder     WISDOM TOOTH EXTRACTION      Family History  Problem Relation Age of Onset   Hypertension Mother    Heart disease Mother        Had a stent   Emphysema Father    Diabetes Father    COPD Father    Stroke Sister    Breast cancer Neg Hx     Social History   Socioeconomic History   Marital status: Married    Spouse name: Not on file  Number of children: Not on file   Years of education: Not on file   Highest education level: Associate degree: occupational, Scientist, product/process development, or vocational program  Occupational History   Occupation: hairdresser  Tobacco Use   Smoking status: Never   Smokeless tobacco: Never  Vaping Use   Vaping status: Never Used  Substance and Sexual Activity   Alcohol use: No    Alcohol/week: 0.0 standard drinks of alcohol   Drug use: No   Sexual activity: Not Currently    Partners: Male    Birth control/protection: Surgical, Abstinence    Comment: hysterectomy, 17, less than 5  Other Topics Concern   Not on file   Social History Narrative   Not on file   Social Drivers of Health   Financial Resource Strain: Low Risk  (12/07/2023)   Overall Financial Resource Strain (CARDIA)    Difficulty of Paying Living Expenses: Not hard at all  Food Insecurity: Patient Declined (02/29/2024)   Hunger Vital Sign    Worried About Running Out of Food in the Last Year: Patient declined    Ran Out of Food in the Last Year: Patient declined  Transportation Needs: No Transportation Needs (02/29/2024)   PRAPARE - Administrator, Civil Service (Medical): No    Lack of Transportation (Non-Medical): No  Physical Activity: Insufficiently Active (12/07/2023)   Exercise Vital Sign    Days of Exercise per Week: 1 day    Minutes of Exercise per Session: 10 min  Stress: No Stress Concern Present (12/07/2023)   [employer] of Occupational Health - Occupational Stress Questionnaire    Feeling of Stress : Only a little  Social Connections: Socially Integrated (02/29/2024)   Social Connection and Isolation Panel [NHANES]    Frequency of Communication with Friends and Family: More than three times a week    Frequency of Social Gatherings with Friends and Family: More than three times a week    Attends Religious Services: More than 4 times per year    Active Member of [employer] or Organizations: Yes    Attends Engineer, structural: More than 4 times per year    Marital Status: Married  Catering manager Violence: Not At Risk (02/29/2024)   Humiliation, Afraid, Rape, and Kick questionnaire    Fear of Current or Ex-Partner: No    Emotionally Abused: No    Physically Abused: No    Sexually Abused: No    Outpatient Medications Prior to Visit  Medication Sig Dispense Refill   acyclovir (ZOVIRAX) 400 MG tablet Take 1 tablet (400 mg total) by mouth 2 (two) times daily. 60 tablet 5   amoxicillin (AMOXIL) 500 MG capsule Take 2,000 mg by mouth See admin instructions. Take 2,000 mg by mouth one hour prior to  dental visits     Biotin 5000 MCG CAPS Take 5,000 mcg by mouth daily.     Blood Glucose Monitoring Suppl (ONE TOUCH ULTRA 2) w/Device KIT CHECK BLOOD SUGAR TWICE DAILY.  DX CODE  E11.9 1 kit 0   Cholecalciferol (VITAMIN D3) 1000 units CAPS Take 1,000 Units by mouth daily.     clonazePAM (KLONOPIN) 0.5 MG tablet Take 1 tablet (0.5 mg total) by mouth 2 (two) times daily as needed for anxiety. 30 tablet 0   Cyanocobalamin (VITAMIN B-12) 3000 MCG SUBL Place 3,000 mcg under the tongue daily in the afternoon.     dexamethasone (DECADRON) 4 MG tablet Take 5 tablets (20 mg) by mouth with breakfast the day  after every daratumumab dose 30 tablet 1   glucose blood (ONETOUCH ULTRA TEST) test strip Use as instructed 200 each 3   hydrALAZINE (APRESOLINE) 25 MG tablet Take 25 mg by mouth See admin instructions. Take 25 mg by mouth at 8 AM, 3 PM, and 11 PM- in conjunction with one 50 mg tablet to equal a total dose of 75 mg     insulin aspart (NOVOLOG FLEXPEN) 100 UNIT/ML FlexPen Per sliding scale max 40 u daily (Patient taking differently: 2-8 Units See admin instructions. Inject 2-8 units into the skin three times a day with meals, PER SLIDING SCALE) 15 mL 11   Insulin Pen Needle (PEN NEEDLES) 31G X 6 MM MISC As directed 100 each 0   Lancets (ONETOUCH DELICA PLUS LANCET33G) MISC USE 1 LANCET TO TEST AS DIRECTED (DISCARD LANCET IN SHARPS CONTAINER IMMEDIATELY AFTER USE) 200 each 1   levothyroxine (SYNTHROID) 25 MCG tablet TAKE ONE TABLET BY MOUTH EVERY DAY BEFORE BREAKFAST (Patient taking differently: Take 25 mcg by mouth daily before breakfast.) 90 tablet 1   Multiple Vitamins-Minerals (PRESERVISION AREDS 2) CAPS Take 1 capsule by mouth 2 (two) times daily with a meal.     ondansetron (ZOFRAN) 8 MG tablet Take 8 mg by mouth 30 to 60 min prior to Cyclophosphamide administration then take 8 mg every 8 hrs as needed for nausea and vomiting. 30 tablet 1   pantoprazole (PROTONIX) 40 MG tablet Pantoprazole 40 mg before  breakfast and dinner for 90 days with 3 refills (Patient taking differently: Take 40 mg by mouth See admin instructions. Take 40 mg by mouth 30 minutes before lunch and supper) 180 tablet 3   prochlorperazine (COMPAZINE) 10 MG tablet Take 1 tablet (10 mg total) by mouth every 6 (six) hours as needed for nausea or vomiting. 30 tablet 1   rosuvastatin (CRESTOR) 20 MG tablet TAKE ONE TABLET BY MOUTH EVERY DAY 90 tablet 3   Semaglutide (RYBELSUS) 7 MG TABS Take 1 tablet (7 mg total) by mouth daily. 90 tablet 0   SYSTANE ULTRA PF 0.4-0.3 % SOLN Place 1 drop into both eyes See admin instructions. Instill 1 drop into both eyes one to four times a day     UNKNOWN TO PATIENT Place 1 spray into both nostrils See admin instructions. Unnamed nasal emollient- Instill 1 spray into each nostril one to two times a day as needed for dry nasal passages     valsartan (DIOVAN) 160 MG tablet Take 1 tablet (160 mg total) by mouth daily. 30 tablet 3   No facility-administered medications prior to visit.    Allergies  Allergen Reactions   Mucinex [Guaifenesin Er] Anaphylaxis   Bystolic [Nebivolol Hcl] Other (See Comments)    Lowered B/P markedly   Calcium-Containing Compounds Other (See Comments)    Broke out the mouth inside- tiny bumps   Codeine Nausea And Vomiting   Paxlovid [Nirmatrelvir-Ritonavir] Diarrhea and Nausea And Vomiting   Advicor [Niacin-Lovastatin Er] Rash   Amlodipine Rash   Lisinopril-Hydrochlorothiazide Rash   Other Rash and Other (See Comments)    Eye drops with preservatives   Ramipril Rash   Sulfonamide Derivatives Rash    ROS Review of Systems  Constitutional: Negative for activity change, appetite change and fatigue.  HENT: Negative for hearing loss, congestion, tinnitus and ear discharge.  dentist q10m Eyes: Negative for visual disturbance (see optho q1y -- vision corrected to 20/20 with glasses).  Respiratory: Negative for cough, chest tightness and shortness of breath.  Cardiovascular: Negative for chest pain, palpitations and leg swelling.  Gastrointestinal: Negative for abdominal pain, diarrhea, constipation and abdominal distention.  Genitourinary: Negative for urgency, frequency, decreased urine volume and difficulty urinating.  Musculoskeletal: Negative for back pain, arthralgias and gait problem.  Skin: Negative for color change, pallor and rash.  Neurological: Negative for dizziness, light-headedness, numbness and headaches.  Hematological: Negative for adenopathy. Does not bruise/bleed easily.  Psychiatric/Behavioral: Negative for suicidal ideas, confusion, sleep disturbance, self-injury, dysphoric mood, decreased concentration and agitation.        Objective:    Physical Exam  BP 137/72 (Cuff Size: Normal)  Wt Readings from Last 3 Encounters:  03/08/24 109 lb 14.4 oz (49.9 kg)  02/05/24 116 lb 3.2 oz (52.7 kg)  02/04/24 116 lb 6.4 oz (52.8 kg)       Assessment & Plan:  There are no diagnoses linked to this encounter.   I discussed the assessment and treatment plan with the patient. The patient was provided an opportunity to ask questions and all were answered. The patient agreed with the plan and demonstrated an understanding of the instructions.   The patient was advised to call back or seek an in-person evaluation if the symptoms worsen or if the condition fails to improve as anticipated. Assessment and Plan Assessment & Plan Cancer   She is undergoing chemotherapy and experiencing side effects from steroid use, including anxiety, agitation, insomnia, and constipation. Anxiety and agitation will be managed as needed. Administer Colace the morning after chemotherapy to manage constipation.  Hypertension   Her blood pressure medication, Valsartan 160 mg, was held post-hospitalization due to hypotension. Blood pressure has been mostly within normal limits without medication, averaging 135/68 mmHg, with occasional readings in the  150s/70s. Manage conservatively without medication unless blood pressure consistently exceeds 140/88 mmHg. If elevated, restart Valsartan at 80 mg to avoid hypotension. Monitor blood pressure regularly and report any concerns.  Diabetes Mellitus   Diabetes is managed with Rybelsus, and blood glucose levels are well-controlled, with morning readings around 112-114 mg/dL. Steroid use during chemotherapy occasionally causes hyperglycemia, managed with sliding scale insulin. Continue Rybelsus as prescribed and use sliding scale insulin for hyperglycemia due to steroid use.  Hyperlipidemia   She is on Crestor for hyperlipidemia, and regular cholesterol monitoring is necessary. Blood work for cholesterol and A1c will be coordinated with chemotherapy tests to minimize healthcare exposure. Check cholesterol levels every six months.  General Health Maintenance   She is up to date on RSV vaccination. COVID booster discussion is ongoing; advised to consult oncologist due to chemotherapy.  Follow-up   Monitor blood pressure and blood glucose levels. Check cholesterol and A1c levels during routine chemotherapy blood work in June. Schedule virtual follow-up appointments as needed.   Donato Schultz, DO [facility] at [facility] [phone] (phone) [phone] (fax)  [facility]

## 2024-03-22 ENCOUNTER — Inpatient Hospital Stay

## 2024-03-22 ENCOUNTER — Inpatient Hospital Stay (HOSPITAL_BASED_OUTPATIENT_CLINIC_OR_DEPARTMENT_OTHER): Admitting: Hematology

## 2024-03-22 ENCOUNTER — Inpatient Hospital Stay: Attending: Hematology

## 2024-03-22 VITALS — BP 133/66 | HR 94 | Temp 98.1°F | Resp 17 | Wt 108.6 lb

## 2024-03-22 DIAGNOSIS — C9000 Multiple myeloma not having achieved remission: Secondary | ICD-10-CM | POA: Diagnosis not present

## 2024-03-22 DIAGNOSIS — Z79899 Other long term (current) drug therapy: Secondary | ICD-10-CM | POA: Insufficient documentation

## 2024-03-22 DIAGNOSIS — Z79620 Long term (current) use of immunosuppressive biologic: Secondary | ICD-10-CM | POA: Diagnosis not present

## 2024-03-22 DIAGNOSIS — Z5111 Encounter for antineoplastic chemotherapy: Secondary | ICD-10-CM

## 2024-03-22 DIAGNOSIS — Z5112 Encounter for antineoplastic immunotherapy: Secondary | ICD-10-CM | POA: Insufficient documentation

## 2024-03-22 LAB — CBC WITH DIFFERENTIAL (CANCER CENTER ONLY)
Abs Immature Granulocytes: 0.02 10*3/uL (ref 0.00–0.07)
Basophils Absolute: 0 10*3/uL (ref 0.0–0.1)
Basophils Relative: 0 %
Eosinophils Absolute: 0.1 10*3/uL (ref 0.0–0.5)
Eosinophils Relative: 1 %
HCT: 27.6 % — ABNORMAL LOW (ref 36.0–46.0)
Hemoglobin: 9.3 g/dL — ABNORMAL LOW (ref 12.0–15.0)
Immature Granulocytes: 0 %
Lymphocytes Relative: 15 %
Lymphs Abs: 0.8 10*3/uL (ref 0.7–4.0)
MCH: 32.3 pg (ref 26.0–34.0)
MCHC: 33.7 g/dL (ref 30.0–36.0)
MCV: 95.8 fL (ref 80.0–100.0)
Monocytes Absolute: 0.5 10*3/uL (ref 0.1–1.0)
Monocytes Relative: 9 %
Neutro Abs: 4 10*3/uL (ref 1.7–7.7)
Neutrophils Relative %: 75 %
Platelet Count: 265 10*3/uL (ref 150–400)
RBC: 2.88 MIL/uL — ABNORMAL LOW (ref 3.87–5.11)
RDW: 16.8 % — ABNORMAL HIGH (ref 11.5–15.5)
WBC Count: 5.4 10*3/uL (ref 4.0–10.5)
nRBC: 0 % (ref 0.0–0.2)

## 2024-03-22 MED ORDER — DIPHENHYDRAMINE HCL 25 MG PO CAPS
25.0000 mg | ORAL_CAPSULE | Freq: Once | ORAL | Status: AC
  Administered 2024-03-22: 25 mg via ORAL
  Filled 2024-03-22: qty 1

## 2024-03-22 MED ORDER — ACETAMINOPHEN 325 MG PO TABS
650.0000 mg | ORAL_TABLET | Freq: Once | ORAL | Status: AC
  Administered 2024-03-22: 650 mg via ORAL
  Filled 2024-03-22: qty 2

## 2024-03-22 MED ORDER — DARATUMUMAB-HYALURONIDASE-FIHJ 1800-30000 MG-UT/15ML ~~LOC~~ SOLN
1800.0000 mg | Freq: Once | SUBCUTANEOUS | Status: AC
  Administered 2024-03-22: 1800 mg via SUBCUTANEOUS
  Filled 2024-03-22: qty 15

## 2024-03-22 MED ORDER — DEXAMETHASONE 4 MG PO TABS
20.0000 mg | ORAL_TABLET | Freq: Once | ORAL | Status: AC
  Administered 2024-03-22: 20 mg via ORAL
  Filled 2024-03-22: qty 5

## 2024-03-22 NOTE — Patient Instructions (Signed)
 [facility]  Discharge Instructions: Thank you for choosing [facility] to provide your oncology and hematology care.   If you have a lab appointment with the Cancer Center, please go directly to the Cancer Center and check in at the registration area.   Wear comfortable clothing and clothing appropriate for easy access to any Portacath or PICC line.   We strive to give you quality time with your provider. You may need to reschedule your appointment if you arrive late (15 or more minutes).  Arriving late affects you and other patients whose appointments are after yours.  Also, if you miss three or more appointments without notifying the office, you may be dismissed from the clinic at the provider's discretion.      For prescription refill requests, have your pharmacy contact our office and allow 72 hours for refills to be completed.    Today you received the following chemotherapy and/or immunotherapy agents Darzalex faspro      To help prevent nausea and vomiting after your treatment, we encourage you to take your nausea medication as directed.  BELOW ARE SYMPTOMS THAT SHOULD BE REPORTED IMMEDIATELY: *FEVER GREATER THAN 100.4 F (38 C) OR HIGHER *CHILLS OR SWEATING *NAUSEA AND VOMITING THAT IS NOT CONTROLLED WITH YOUR NAUSEA MEDICATION *UNUSUAL SHORTNESS OF BREATH *UNUSUAL BRUISING OR BLEEDING *URINARY PROBLEMS (pain or burning when urinating, or frequent urination) *BOWEL PROBLEMS (unusual diarrhea, constipation, pain near the anus) TENDERNESS IN MOUTH AND THROAT WITH OR WITHOUT PRESENCE OF ULCERS (sore throat, sores in mouth, or a toothache) UNUSUAL RASH, SWELLING OR PAIN  UNUSUAL VAGINAL DISCHARGE OR ITCHING   Items with * indicate a potential emergency and should be followed up as soon as possible or go to the Emergency Department if any problems should occur.  Please show the CHEMOTHERAPY ALERT CARD or  IMMUNOTHERAPY ALERT CARD at check-in to the Emergency Department and triage nurse.  Should you have questions after your visit or need to cancel or reschedule your appointment, please contact [facility] - A DEPT OF [facility]  Dept: [phone]  and follow the prompts.  Office hours are 8:00 a.m. to 4:30 p.m. Monday - Friday. Please note that voicemails left after 4:00 p.m. may not be returned until the following business day.  We are closed weekends and major holidays. You have access to a nurse at all times for urgent questions. Please call the main number to the clinic Dept: [phone] and follow the prompts.   For any non-urgent questions, you may also contact your provider using MyChart. We now offer e-Visits for anyone 40 and older to request care online for non-urgent symptoms. For details visit [email or web address].   Also download the MyChart app! Go to the app store, search "MyChart", open the app, select [place], and log in with your MyChart username and password.

## 2024-03-22 NOTE — Progress Notes (Signed)
 Patient seen by Dr. Addison Naegeli are within treatment parameters.  Labs reviewed: and are within treatment parameters. CMP not drawn today Ok to tx without it  Per physician team, patient is ready for treatment and there are NO modifications to the treatment plan.

## 2024-03-22 NOTE — Progress Notes (Signed)
 HEMATOLOGY/ONCOLOGY CLINIC NOTE  Date of Service: 03/22/24   Patient Care Team: Zola Button, Grayling Congress, DO as PCP - General Wendall Stade, MD as PCP - Cardiology (Cardiology) Mckinley Jewel, MD as Consulting Physician (Ophthalmology) Hart Carwin, MD (Inactive) (Gastroenterology) Kathryne Hitch, MD as Consulting Physician (Orthopedic Surgery) Cherlyn Roberts, MD as Referring Physician (Dermatology) Genia Del, MD as Consulting Physician (Obstetrics and Gynecology) Johney Maine, MD as Consulting Physician (Hematology)  CHIEF COMPLAINTS/PURPOSE OF CONSULTATION:  Evaluation and management of recently diagnosed Multiple myeloma   HISTORY OF PRESENTING ILLNESS:   Cynthia Burns is a wonderful 78 y.o. female who was scheduled to see Dr. Kalman Drape as a new patient and was referred to the ED for evaluation and management of newly diagnosed myeloma with rapidly worsening hgb and renal insuffiencey with proteinuria. High suspicious of Multiple myeloma.    Outside labs done at Dr Willette Pa Singh's office (available in referral under media) show M spike of 6.1g/dl and Kappa light chains in the 400's.   Patient is accompanied by her husband and her daughter at bed side during the visit. Patient notes she has been having significantly more fatigue. Has required PRBC transfusions for symptomatic anemia.   She complains of fatigue, right ankle pain, and unexpected weight loss of around 25-30 lbs due to appetite loss in the past year. Her daughter notes that the patient has been confused more often as well.    She denies any new infection issues, fever, chills, back pain, chest pain, abdominal pain, or leg swelling.    Patient notes she tested positive for COVID-19 infection in August 2022 and was prescribed Paxlovid. However, she did not tolerate the medication due to allergic reaction.   PmHx of hypertension. She has been taking Losartan and hydralazine.    Surgery history: Hysterectomy and Cholecystectomy.    She was started on dexamethasone 20 mg daily x 4 doses yesterday. She has been tolerating the high-dose steroid well.   INTERVAL HISTORY:  Cynthia Burns is a 78 y.o. female here for evaluation and management of newly diagnosed myeloma with rapidly worsening hgb and renal insufficiency with proteinuria. She is here for cycle 1 day 22 of her treatment.   Patient was last seen by me on 03/08/2024 and she was doing well overall.   Patient is accompanied by her husband and her daughter. Patient notes she has been doing well overall since our last visit. She denies any dizziness episodes. She did follow-up with her PCP since our last visit and pt has discontinued her hypertension medication. Her blood pressure during this visit is in the normal range at 133/66.  She denies any new infection issues, fever, chills, appetite loss, night sweats, unexpected weight loss, back pain, chest pain, abdominal pain, or leg swelling. She does complain of constipation. She has been taking stool softener, which has been helping her constipation symptoms.   She also complains of mild irritability and anxiety. For example: patient got irritated and anxious due to noise from her neighbor's motorcycle.  Patient also complains of insomnia.  Patient has been tolerating her treatment well without any new or severe toxicities.   Patient notes that her blood glucose levels at home have been in the normal range, around 112.   Patient has been staying well-hydrated.    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   HSV-1 infection    HYPERTENSION    Hypothyroid    MITRAL VALVE  PROLAPSE    OSTEOPENIA    Overweight(278.02)    PHARYNGITIS, ACUTE     SURGICAL HISTORY: Past Surgical History:  Procedure Laterality Date   ABDOMINAL HYSTERECTOMY     CHOLECYSTECTOMY     prolped bladder     WISDOM TOOTH EXTRACTION      SOCIAL HISTORY: Social History   Socioeconomic History   Marital status: Married    Spouse name: Not on file   Number of children: Not on file   Years of education: Not on file   Highest education level: Associate degree: occupational, Scientist, product/process development, or vocational program  Occupational History   Occupation: hairdresser  Tobacco Use   Smoking status: Never   Smokeless tobacco: Never  Vaping Use   Vaping status: Never Used  Substance and Sexual Activity   Alcohol use: No    Alcohol/week: 0.0 standard drinks of alcohol   Drug use: No   Sexual activity: Not Currently    Partners: Male    Birth control/protection: Surgical, Abstinence    Comment: hysterectomy, 17, less than 5  Other Topics Concern   Not on file  Social History Narrative   Not on file   Social Drivers of Health   Financial Resource Strain: Low Risk  (12/07/2023)   Overall Financial Resource Strain (CARDIA)    Difficulty of Paying Living Expenses: Not hard at all  Food Insecurity: Patient Declined (02/29/2024)   Hunger Vital Sign    Worried About Running Out of Food in the Last Year: Patient declined    Ran Out of Food in the Last Year: Patient declined  Transportation Needs: No Transportation Needs (02/29/2024)   PRAPARE - Administrator, Civil Service (Medical): No    Lack of Transportation (Non-Medical): No  Physical Activity: Insufficiently Active (12/07/2023)   Exercise Vital Sign    Days of Exercise per Week: 1 day    Minutes of Exercise per Session: 10 min  Stress: No Stress Concern Present (12/07/2023)   [employer] of Occupational Health - Occupational Stress Questionnaire    Feeling of Stress : Only a little  Social Connections: Socially  Integrated (02/29/2024)   Social Connection and Isolation Panel [NHANES]    Frequency of Communication with Friends and Family: More than three times a week    Frequency of Social Gatherings with Friends and Family: More than three times a week    Attends Religious Services: More than 4 times per year    Active Member of [employer] or Organizations: Yes    Attends Engineer, structural: More than 4 times per year    Marital Status: Married  Catering manager Violence: Not At Risk (02/29/2024)   Humiliation, Afraid, Rape, and Kick questionnaire    Fear of Current or Ex-Partner: No    Emotionally Abused: No    Physically Abused: No    Sexually Abused: No    FAMILY HISTORY: Family History  Problem Relation Age of Onset   Hypertension Mother    Heart disease Mother        Had a stent   Emphysema Father    Diabetes Father    COPD Father  Stroke Sister    Breast cancer Neg Hx     ALLERGIES:  is allergic to mucinex [guaifenesin er], bystolic [nebivolol hcl], calcium-containing compounds, codeine, paxlovid [nirmatrelvir-ritonavir], advicor [niacin-lovastatin er], amlodipine, lisinopril-hydrochlorothiazide, other, ramipril, and sulfonamide derivatives.  MEDICATIONS:  Current Outpatient Medications  Medication Sig Dispense Refill   acyclovir (ZOVIRAX) 400 MG tablet Take 1 tablet (400 mg total) by mouth 2 (two) times daily. 60 tablet 5   amoxicillin (AMOXIL) 500 MG capsule Take 2,000 mg by mouth See admin instructions. Take 2,000 mg by mouth one hour prior to dental visits     Biotin 5000 MCG CAPS Take 5,000 mcg by mouth daily.     Blood Glucose Monitoring Suppl (ONE TOUCH ULTRA 2) w/Device KIT CHECK BLOOD SUGAR TWICE DAILY.  DX CODE  E11.9 1 kit 0   Cholecalciferol (VITAMIN D3) 1000 units CAPS Take 1,000 Units by mouth daily.     clonazePAM (KLONOPIN) 0.5 MG tablet Take 1 tablet (0.5 mg total) by mouth 2 (two) times daily as needed for anxiety. 30 tablet 0   Cyanocobalamin (VITAMIN  B-12) 3000 MCG SUBL Place 3,000 mcg under the tongue daily in the afternoon.     dexamethasone (DECADRON) 4 MG tablet Take 5 tablets (20 mg) by mouth with breakfast the day after every daratumumab dose 30 tablet 1   glucose blood (ONETOUCH ULTRA TEST) test strip Use as instructed 200 each 3   [Paused] hydrALAZINE (APRESOLINE) 25 MG tablet Take 25 mg by mouth See admin instructions. Take 25 mg by mouth at 8 AM, 3 PM, and 11 PM- in conjunction with one 50 mg tablet to equal a total dose of 75 mg     insulin aspart (NOVOLOG FLEXPEN) 100 UNIT/ML FlexPen Per sliding scale max 40 u daily (Patient taking differently: 2-8 Units See admin instructions. Inject 2-8 units into the skin three times a day with meals, PER SLIDING SCALE) 15 mL 11   Insulin Pen Needle (PEN NEEDLES) 31G X 6 MM MISC As directed 100 each 0   Lancets (ONETOUCH DELICA PLUS LANCET33G) MISC USE 1 LANCET TO TEST AS DIRECTED (DISCARD LANCET IN SHARPS CONTAINER IMMEDIATELY AFTER USE) 200 each 1   levothyroxine (SYNTHROID) 25 MCG tablet TAKE ONE TABLET BY MOUTH EVERY DAY BEFORE BREAKFAST (Patient taking differently: Take 25 mcg by mouth daily before breakfast.) 90 tablet 1   Multiple Vitamins-Minerals (PRESERVISION AREDS 2) CAPS Take 1 capsule by mouth 2 (two) times daily with a meal.     ondansetron (ZOFRAN) 8 MG tablet Take 8 mg by mouth 30 to 60 min prior to Cyclophosphamide administration then take 8 mg every 8 hrs as needed for nausea and vomiting. 30 tablet 1   pantoprazole (PROTONIX) 40 MG tablet Pantoprazole 40 mg before breakfast and dinner for 90 days with 3 refills (Patient taking differently: Take 40 mg by mouth See admin instructions. Take 40 mg by mouth 30 minutes before lunch and supper) 180 tablet 3   prochlorperazine (COMPAZINE) 10 MG tablet Take 1 tablet (10 mg total) by mouth every 6 (six) hours as needed for nausea or vomiting. 30 tablet 1   rosuvastatin (CRESTOR) 20 MG tablet TAKE ONE TABLET BY MOUTH EVERY DAY 90 tablet 3    Semaglutide (RYBELSUS) 7 MG TABS Take 1 tablet (7 mg total) by mouth daily. 90 tablet 0   SYSTANE ULTRA PF 0.4-0.3 % SOLN Place 1 drop into both eyes See admin instructions. Instill 1 drop into both eyes one to four times a day  UNKNOWN TO PATIENT Place 1 spray into both nostrils See admin instructions. Unnamed nasal emollient- Instill 1 spray into each nostril one to two times a day as needed for dry nasal passages     No current facility-administered medications for this visit.    REVIEW OF SYSTEMS:    10 Point review of Systems was done is negative except as noted above.  PHYSICAL EXAMINATION:  ECOG PERFORMANCE STATUS: 2 - Symptomatic, <50% confined to bed  Vitals:   03/22/24 1202  BP: 133/66  Pulse: 94  Resp: 17  Temp: 98.1 F (36.7 C)  SpO2: 100%    Filed Weights   03/22/24 1202  Weight: 108 lb 9.6 oz (49.3 kg)    .Body mass index is 20.52 kg/m.  GENERAL:alert, in no acute distress and comfortable SKIN: no acute rashes, no significant lesions EYES: conjunctiva are pink and non-injected, sclera anicteric OROPHARYNX: MMM, no exudates, no oropharyngeal erythema or ulceration NECK: supple, no JVD LYMPH:  no palpable lymphadenopathy in the cervical, axillary or inguinal regions. LUNGS: clear to auscultation b/l with normal respiratory effort HEART: regular rate & rhythm ABDOMEN:  normoactive bowel sounds , non tender, not distended. Extremity: no pedal edema PSYCH: alert & oriented x 3 with fluent speech NEURO: no focal motor/sensory deficits  LABORATORY DATA:  I have reviewed the data as listed  .    Latest Ref Rng & Units 03/22/2024   11:07 AM 03/15/2024    8:26 AM 03/08/2024    8:21 AM  CBC  WBC 4.0 - 10.5 K/uL 5.4  6.5  7.5   Hemoglobin 12.0 - 15.0 g/dL 9.3  16.1  9.4   Hematocrit 36.0 - 46.0 % 27.6  29.6  28.5   Platelets 150 - 400 K/uL 265  171  170        Latest Ref Rng & Units 03/15/2024    8:26 AM 03/08/2024    8:21 AM 03/02/2024    6:03 AM  CMP   Glucose 70 - 99 mg/dL 096  045  92   BUN 8 - 23 mg/dL 409  88  60   Creatinine 0.44 - 1.00 mg/dL 8.11  9.14  7.82   Sodium 135 - 145 mmol/L 125  128  130   Potassium 3.5 - 5.1 mmol/L 4.1  3.7  3.5   Chloride 98 - 111 mmol/L 101  104  108   CO2 22 - 32 mmol/L 21  22  19    Calcium 8.9 - 10.3 mg/dL 8.9  8.7  8.1   Total Protein 6.5 - 8.1 g/dL 95.6  >21.3  08.6   Total Bilirubin 0.0 - 1.2 mg/dL 0.3  0.3  0.6   Alkaline Phos 38 - 126 U/L 42  37  26   AST 15 - 41 U/L 35  57  17   ALT 0 - 44 U/L 71  83  30    Bone marrow biopsy 02/01/2024:    Surgical pathology 01/29/2024:    RADIOGRAPHIC STUDIES: I have personally reviewed the radiological images as listed and agreed with the findings in the report. ECHOCARDIOGRAM COMPLETE Result Date: 03/01/2024    ECHOCARDIOGRAM REPORT   Patient Name:   TESSY PAWELSKI Date of Exam: 03/01/2024 Medical Rec #:  [id number]       Height:       61.0 in Accession #:    [id number]      Weight:       116.2 lb Date of Birth:  [birth year]      BSA:          1.500 m Patient Age:    77 years        BP:           165/95 mmHg Patient Gender: F               HR:           79 bpm. Exam Location:  Inpatient Procedure: 2D Echo, Cardiac Doppler and Color Doppler (Both Spectral and Color            Flow Doppler were utilized during procedure). Indications:    Syncope  History:        Patient has prior history of Echocardiogram examinations, most                 recent 11/24/2018. Risk Factors:Hypertension, Diabetes and                 Dyslipidemia. CKD.  Sonographer:    Vern Claude Referring Phys: [id number] HERSH GOEL IMPRESSIONS  1. Left ventricular ejection fraction, by estimation, is 60 to 65%. The left ventricle has normal function. The left ventricle has no regional wall motion abnormalities. Left ventricular diastolic parameters are consistent with Grade I diastolic dysfunction (impaired relaxation).  2. Right ventricular systolic function is normal. The right ventricular size is  normal. Tricuspid regurgitation signal is inadequate for assessing PA pressure.  3. The mitral valve is normal in structure. No evidence of mitral valve regurgitation. No evidence of mitral stenosis.  4. The aortic valve is tricuspid. Aortic valve regurgitation is trivial. No aortic stenosis is present.  5. Aortic dilatation noted. There is mild dilatation of the aortic root, measuring 39 mm.  6. The inferior vena cava is normal in size with greater than 50% respiratory variability, suggesting right atrial pressure of 3 mmHg. FINDINGS  Left Ventricle: Left ventricular ejection fraction, by estimation, is 60 to 65%. The left ventricle has normal function. The left ventricle has no regional wall motion abnormalities. The left ventricular internal cavity size was normal in size. There is  no left ventricular hypertrophy. Left ventricular diastolic parameters are consistent with Grade I diastolic dysfunction (impaired relaxation). Right Ventricle: The right ventricular size is normal. No increase in right ventricular wall thickness. Right ventricular systolic function is normal. Tricuspid regurgitation signal is inadequate for assessing PA pressure. Left Atrium: Left atrial size was normal in size. Right Atrium: Right atrial size was normal in size. Pericardium: There is no evidence of pericardial effusion. Mitral Valve: The mitral valve is normal in structure. No evidence of mitral valve regurgitation. No evidence of mitral valve stenosis. MV peak gradient, 10.6 mmHg. The mean mitral valve gradient is 5.0 mmHg. Tricuspid Valve: The tricuspid valve is normal in structure. Tricuspid valve regurgitation is not demonstrated. Aortic Valve: The aortic valve is tricuspid. Aortic valve regurgitation is trivial. No aortic stenosis is present. Aortic valve mean gradient measures 4.0 mmHg. Aortic valve peak gradient measures 6.7 mmHg. Aortic valve area, by VTI measures 1.95 cm. Pulmonic Valve: The pulmonic valve was normal in  structure. Pulmonic valve regurgitation is not visualized. Aorta: Aortic dilatation noted. There is mild dilatation of the aortic root, measuring 39 mm. Venous: The inferior vena cava is normal in size with greater than 50% respiratory variability, suggesting right atrial pressure of 3 mmHg. IAS/Shunts: No atrial level shunt detected by color flow Doppler.  LEFT VENTRICLE PLAX 2D LVIDd:  4.00 cm      Diastology LVIDs:         2.60 cm      LV e' medial:    6.31 cm/s LV PW:         0.70 cm      LV E/e' medial:  10.6 LV IVS:        0.90 cm      LV e' lateral:   8.70 cm/s LVOT diam:     1.70 cm      LV E/e' lateral: 7.7 LV SV:         47 LV SV Index:   32 LVOT Area:     2.27 cm  LV Volumes (MOD) LV vol d, MOD A2C: 110.0 ml LV vol d, MOD A4C: 117.0 ml LV vol s, MOD A2C: 34.6 ml LV vol s, MOD A4C: 36.4 ml LV SV MOD A2C:     75.4 ml LV SV MOD A4C:     117.0 ml LV SV MOD BP:      78.3 ml RIGHT VENTRICLE             IVC RV Basal diam:  2.60 cm     IVC diam: 1.20 cm RV Mid diam:    1.60 cm RV S prime:     10.20 cm/s TAPSE (M-mode): 1.8 cm LEFT ATRIUM             Index        RIGHT ATRIUM          Index LA diam:        2.90 cm 1.93 cm/m   RA Area:     5.86 cm LA Vol (A2C):   34.4 ml 22.94 ml/m  RA Volume:   6.04 ml  4.03 ml/m LA Vol (A4C):   27.4 ml 18.27 ml/m LA Biplane Vol: 32.6 ml 21.74 ml/m  AORTIC VALVE                    PULMONIC VALVE AV Area (Vmax):    1.60 cm     PV Vmax:       0.90 m/s AV Area (Vmean):   1.63 cm     PV Peak grad:  3.3 mmHg AV Area (VTI):     1.95 cm AV Vmax:           129.00 cm/s AV Vmean:          88.300 cm/s AV VTI:            0.243 m AV Peak Grad:      6.7 mmHg AV Mean Grad:      4.0 mmHg LVOT Vmax:         90.70 cm/s LVOT Vmean:        63.300 cm/s LVOT VTI:          0.209 m LVOT/AV VTI ratio: 0.86  AORTA Ao Root diam: 3.90 cm MITRAL VALVE MV Area (PHT): 3.42 cm     SHUNTS MV Area VTI:   1.41 cm     Systemic VTI:  0.21 m MV Peak grad:  10.6 mmHg    Systemic Diam: 1.70 cm MV Mean  grad:  5.0 mmHg MV Vmax:       1.63 m/s MV Vmean:      102.0 cm/s MV Decel Time: 222 msec MV E velocity: 66.70 cm/s MV A velocity: 112.00 cm/s MV E/A ratio:  0.60 Dalton McleanMD Electronically signed by Wilfred Lacy Signature  Date/Time: 03/01/2024/2:46:46 PM    Final    DG Chest Port 1 View Result Date: 02/29/2024 CLINICAL DATA:  Shortness of breath. EXAM: PORTABLE CHEST 1 VIEW COMPARISON:  None Available. FINDINGS: Low lung volume. Bilateral lung fields are clear. No dense consolidation or lung collapse. No frank pulmonary edema. Bilateral costophrenic angles are clear. There is elevated right hemidiaphragm. Normal cardio-mediastinal silhouette. No acute osseous abnormalities. The soft tissues are within normal limits. IMPRESSION: No active disease. Electronically Signed   By: Jules Schick M.D.   On: 02/29/2024 13:59   CT Head Wo Contrast Result Date: 02/29/2024 CLINICAL DATA:  Syncope/presyncope EXAM: CT HEAD WITHOUT CONTRAST TECHNIQUE: Contiguous axial images were obtained from the base of the skull through the vertex without intravenous contrast. RADIATION DOSE REDUCTION: This exam was performed according to the departmental dose-optimization program which includes automated exposure control, adjustment of the mA and/or kV according to patient size and/or use of iterative reconstruction technique. COMPARISON:  None Available. FINDINGS: Brain: No evidence of accelerated brain volume loss. No sign of old or acute focal infarction, mass lesion, hemorrhage, hydrocephalus or extra-axial collection. Vascular: Very minimal atherosclerotic calcification of the major vessels at the base of the brain. Skull: Negative Sinuses/Orbits: Clear/normal Other: None IMPRESSION: No acute or traumatic finding. Very minimal atherosclerotic calcification of the major vessels at the base of the brain. Electronically Signed   By: Paulina Fusi M.D.   On: 02/29/2024 11:34    ASSESSMENT & PLAN:  78 y.o. female with:  Newly  diagnosed Multiple myeloma - Lambda restricted plasma cell neoplasm comprising greater than 75% of  the cellular marrow 2. Acute on CKD 3. HTN 4. DM2 5. Hypothyroidism  Patient Active Problem List   Diagnosis Date Noted   Symptomatic anemia 01/29/2024   Multiple myeloma (HCC) 01/29/2024   Acute renal failure superimposed on stage 3b chronic kidney disease (HCC) 01/29/2024   Anemia 11/26/2023   Hypothyroidism 02/23/2023   Type 2 diabetes mellitus with hyperglycemia, without long-term current use of insulin (HCC) 07/26/2021   Impacted cerumen of right ear 07/06/2020   Type 2 diabetes mellitus with hyperglycemia (HCC) 04/11/2019   Hyperlipidemia LDL goal <70 03/23/2017   Abdominal pain, epigastric 09/03/2016   Strain of right biceps 03/17/2016   Right shoulder pain 03/17/2016   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   Plantar wart of right foot 03/15/2015   Uncontrolled type 2 diabetes mellitus with hyperglycemia (HCC) 09/16/2013   Elevated lipids 09/05/2013   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 Hyponatremia (from 02/23/2023) chronic. Recent thyroid function tests WNL. 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  PLAN: -Discussed lab results from today, 03/22/2024, in detail with the patient. CBC shows low Hgb of 9.3 g/dL with Hct of 16.1%. CMP reviewed. -Blood transfusion is not required as pt's hgb is 9.3.  -Discussed the option of Senna-S to help with constipation.  -Discussed with the patient that she can take Klonopin as needed to help her sleep and anxiety.  -Drop one tab of dexamethasone this week. Start taking 4 tablets I.e 16 mg Dexamethasone after this week and drop it by 4mg  every treatment -Patient has been tolerating her  first cycle of her treatment well.  -Patient can proceed with cycle 1 day 22 of her treatment today without any dose  modifications. -Answered all of patient's questions.   FOLLOW-UP: Per integrated scheduling  The total time spent in the appointment was 32 minutes* .  All of the patient's questions were answered with apparent satisfaction. The patient knows to call the clinic with any problems, questions or concerns.   Wyvonnia Lora MD MS AAHIVMS [facility] Hematology/Oncology Physician [facility]  .*Total Encounter Time as defined by the Centers for Medicare and Medicaid Services includes, in addition to the face-to-face time of a patient visit (documented in the note above) non-face-to-face time: obtaining and reviewing outside history, ordering and reviewing medications, tests or procedures, care coordination (communications with other health care professionals or caregivers) and documentation in the medical record.   I,Param Shah,acting as a Neurosurgeon for Wyvonnia Lora, MD.,have documented all relevant documentation on the behalf of Wyvonnia Lora, MD,as directed by  Wyvonnia Lora, MD while in the presence of Wyvonnia Lora, MD.  .I have reviewed the above documentation for accuracy and completeness, and I agree with the above. Johney Maine MD

## 2024-03-24 ENCOUNTER — Telehealth: Payer: Self-pay | Admitting: Hematology

## 2024-03-24 NOTE — Telephone Encounter (Signed)
 Left a voicemail with scheduled nutrition appointment details.

## 2024-03-28 ENCOUNTER — Encounter: Payer: Self-pay | Admitting: Hematology

## 2024-03-29 ENCOUNTER — Ambulatory Visit: Admitting: Dietician

## 2024-03-29 ENCOUNTER — Inpatient Hospital Stay

## 2024-03-29 VITALS — BP 136/61 | HR 73 | Temp 97.6°F | Resp 18 | Wt 107.4 lb

## 2024-03-29 DIAGNOSIS — Z5112 Encounter for antineoplastic immunotherapy: Secondary | ICD-10-CM | POA: Diagnosis not present

## 2024-03-29 DIAGNOSIS — C9000 Multiple myeloma not having achieved remission: Secondary | ICD-10-CM

## 2024-03-29 DIAGNOSIS — Z79899 Other long term (current) drug therapy: Secondary | ICD-10-CM | POA: Diagnosis not present

## 2024-03-29 DIAGNOSIS — Z79620 Long term (current) use of immunosuppressive biologic: Secondary | ICD-10-CM | POA: Diagnosis not present

## 2024-03-29 LAB — CMP (CANCER CENTER ONLY)
ALT: 44 U/L (ref 0–44)
AST: 23 U/L (ref 15–41)
Albumin: 3.3 g/dL — ABNORMAL LOW (ref 3.5–5.0)
Alkaline Phosphatase: 49 U/L (ref 38–126)
Anion gap: 5 (ref 5–15)
BUN: 86 mg/dL — ABNORMAL HIGH (ref 8–23)
CO2: 24 mmol/L (ref 22–32)
Calcium: 8.7 mg/dL — ABNORMAL LOW (ref 8.9–10.3)
Chloride: 103 mmol/L (ref 98–111)
Creatinine: 1.34 mg/dL — ABNORMAL HIGH (ref 0.44–1.00)
GFR, Estimated: 41 mL/min — ABNORMAL LOW (ref >60)
Glucose, Bld: 144 mg/dL — ABNORMAL HIGH (ref 70–99)
Potassium: 3.7 mmol/L (ref 3.5–5.1)
Sodium: 132 mmol/L — ABNORMAL LOW (ref 135–145)
Total Bilirubin: 0.3 mg/dL (ref 0.0–1.2)
Total Protein: 8.3 g/dL — ABNORMAL HIGH (ref 6.5–8.1)

## 2024-03-29 LAB — CBC WITH DIFFERENTIAL (CANCER CENTER ONLY)
Abs Immature Granulocytes: 0.02 10*3/uL (ref 0.00–0.07)
Basophils Absolute: 0 10*3/uL (ref 0.0–0.1)
Basophils Relative: 0 %
Eosinophils Absolute: 0.1 10*3/uL (ref 0.0–0.5)
Eosinophils Relative: 1 %
HCT: 28.6 % — ABNORMAL LOW (ref 36.0–46.0)
Hemoglobin: 9.5 g/dL — ABNORMAL LOW (ref 12.0–15.0)
Immature Granulocytes: 0 %
Lymphocytes Relative: 14 %
Lymphs Abs: 1.1 10*3/uL (ref 0.7–4.0)
MCH: 32.2 pg (ref 26.0–34.0)
MCHC: 33.2 g/dL (ref 30.0–36.0)
MCV: 96.9 fL (ref 80.0–100.0)
Monocytes Absolute: 0.7 10*3/uL (ref 0.1–1.0)
Monocytes Relative: 9 %
Neutro Abs: 6 10*3/uL (ref 1.7–7.7)
Neutrophils Relative %: 76 %
Platelet Count: 284 10*3/uL (ref 150–400)
RBC: 2.95 MIL/uL — ABNORMAL LOW (ref 3.87–5.11)
RDW: 17.2 % — ABNORMAL HIGH (ref 11.5–15.5)
WBC Count: 7.9 10*3/uL (ref 4.0–10.5)
nRBC: 0 % (ref 0.0–0.2)

## 2024-03-29 MED ORDER — DARATUMUMAB-HYALURONIDASE-FIHJ 1800-30000 MG-UT/15ML ~~LOC~~ SOLN
1800.0000 mg | Freq: Once | SUBCUTANEOUS | Status: AC
  Administered 2024-03-29: 1800 mg via SUBCUTANEOUS
  Filled 2024-03-29: qty 15

## 2024-03-29 MED ORDER — DIPHENHYDRAMINE HCL 25 MG PO CAPS
25.0000 mg | ORAL_CAPSULE | Freq: Once | ORAL | Status: AC
Start: 2024-03-29 — End: 2024-03-29
  Administered 2024-03-29: 25 mg via ORAL
  Filled 2024-03-29: qty 1

## 2024-03-29 MED ORDER — ACETAMINOPHEN 325 MG PO TABS
650.0000 mg | ORAL_TABLET | Freq: Once | ORAL | Status: AC
Start: 2024-03-29 — End: 2024-03-29
  Administered 2024-03-29: 650 mg via ORAL
  Filled 2024-03-29: qty 2

## 2024-03-29 MED ORDER — DEXAMETHASONE 4 MG PO TABS
20.0000 mg | ORAL_TABLET | Freq: Once | ORAL | Status: AC
Start: 2024-03-29 — End: 2024-03-29
  Administered 2024-03-29: 20 mg via ORAL
  Filled 2024-03-29: qty 5

## 2024-03-29 MED ORDER — BORTEZOMIB CHEMO SQ INJECTION 3.5 MG (2.5MG/ML)
1.5000 mg/m2 | Freq: Once | INTRAMUSCULAR | Status: AC
  Administered 2024-03-29: 2.25 mg via SUBCUTANEOUS
  Filled 2024-03-29: qty 0.9

## 2024-03-29 NOTE — Progress Notes (Signed)
 Nutrition Assessment   Reason for Assessment: Referral   ASSESSMENT: 77 year old female with myeloma. She is receiving DaraCyBorD q28d. Patient is under the care of Dr. Candise Che  Past medical history includes HTN, mitral valve prolapse, DM2, osteopenia, hypothyroid, CKD3, HLD, symptomatic anemia  Met with patient and husband in infusion. Daughter contacted via telephone to be present at visit. Patient reports good appetite. Says she eats all day long. Recalls a variety of foods for breakfast (bacon, eggs, grits, oatmeal). Had bagel with honey nut cream cheese this morning. Lunch is usually largest meal of day. Recalls grilled tuna salad with chips yesterday. Dinners are light (soup/sandwich, cereal with 2% milk). Patient is concerned about hyperglycemia secondary to steroids. Per daughter, pt avoiding foods due to this. Reports pt declining bowl of potato soup due to concerns of high blood sugar. She is on SSI. Patient is drinking 4 Ensure Max (150 kcal, 30g each) and 6 bottles of water. She denies nausea, vomiting, diarrhea. Constipation following steroids managed with stool softener.   Nutrition Focused Physical Exam: deferred    Medications: D3, acyclovir, klonopin, B12, decadron, apresoline, novolog, synthroid, MVI, zofran, protonix, compazine, crestor, semaglutide   Labs: Hgb 9.5, Na 132, glucose 144, BUN 86, Cr 1.34, albumin 3.3   Anthropometrics:   Height: 5'1" Weight: 108 lb 9.6 oz UBW: 111-116 lb (per chart 2024) BMI: 20.52   NUTRITION DIAGNOSIS: Food and nutrition related knowledge deficit related to cancer and associated treatment as evidenced by no prior need for associated nutrition information   INTERVENTION:  Educated on increased calorie/protein energy intake to minimize loss of LBM Discussed ways to add calories/protein to foods (cooking with butter, adding cheese/gravy/sauces, switching to fairlife milk) Educated on strategies for managing glucose - continue small  frequent meals, including protein foods at every meal Continue drinking Ensure Max (150 kcal, 30g) suggested CIB with fairlife milk for added calories Handouts with snack ideas, list of protein foods, contact information provided  Continue stool softener for constipation per MD   MONITORING, EVALUATION, GOAL: Pt will tolerate increased calories and protein to minimize further wt loss   Next Visit: Tuesday April 29 during infusion

## 2024-03-29 NOTE — Progress Notes (Signed)
 Patient noted increased blurred vision following previous treatment. This went on for a few days and then resolved. Dr. Candise Che made aware and noted that this is likely d/t fluctuating blood glucose levels. Dr. Candise Che still recommends following up with optometrist/ophthalmologist for further evaluation. Patient and spouse aware and verbalized an understanding of the information.  Patient is also to continue with home steroid dose weaning. Patient and spouse aware.  Okay to proceed with treatment today.

## 2024-03-29 NOTE — Patient Instructions (Signed)
 [facility]   Discharge Instructions: Thank you for choosing [facility] to provide your oncology and hematology care.   If you have a lab appointment with the Cancer Center, please go directly to the Cancer Center and check in at the registration area.   Wear comfortable clothing and clothing appropriate for easy access to any Portacath or PICC line.   We strive to give you quality time with your provider. You may need to reschedule your appointment if you arrive late (15 or more minutes).  Arriving late affects you and other patients whose appointments are after yours.  Also, if you miss three or more appointments without notifying the office, you may be dismissed from the clinic at the provider's discretion.      For prescription refill requests, have your pharmacy contact our office and allow 72 hours for refills to be completed.    Today you received the following chemotherapy and/or immunotherapy agents: Daratumumab hyaluronidase (Darzalex faspro) and Bortezomib (Velcade)      To help prevent nausea and vomiting after your treatment, we encourage you to take your nausea medication as directed.  BELOW ARE SYMPTOMS THAT SHOULD BE REPORTED IMMEDIATELY: *FEVER GREATER THAN 100.4 F (38 C) OR HIGHER *CHILLS OR SWEATING *NAUSEA AND VOMITING THAT IS NOT CONTROLLED WITH YOUR NAUSEA MEDICATION *UNUSUAL SHORTNESS OF BREATH *UNUSUAL BRUISING OR BLEEDING *URINARY PROBLEMS (pain or burning when urinating, or frequent urination) *BOWEL PROBLEMS (unusual diarrhea, constipation, pain near the anus) TENDERNESS IN MOUTH AND THROAT WITH OR WITHOUT PRESENCE OF ULCERS (sore throat, sores in mouth, or a toothache) UNUSUAL RASH, SWELLING OR PAIN  UNUSUAL VAGINAL DISCHARGE OR ITCHING   Items with * indicate a potential emergency and should be followed up as soon as possible or go to the Emergency Department if any problems should  occur.  Please show the CHEMOTHERAPY ALERT CARD or IMMUNOTHERAPY ALERT CARD at check-in to the Emergency Department and triage nurse.  Should you have questions after your visit or need to cancel or reschedule your appointment, please contact [facility] - [facility] DEPT [facility]  Dept: [phone]  and follow the prompts.  Office hours are 8:00 a.m. to 4:30 p.m. Monday - Friday. Please note that voicemails left after 4:00 p.m. may not be returned until the following business day.  We are closed weekends and major holidays. You have access to a nurse at all times for urgent questions. Please call the main number to the clinic Dept: [phone] and follow the prompts.   For any non-urgent questions, you may also contact your provider using MyChart. We now offer e-Visits for anyone 61 and older to request care online for non-urgent symptoms. For details visit [email or web address].   Also download the MyChart app! Go to the app store, search "MyChart", open the app, select [place], and log in with your MyChart username and password.

## 2024-03-31 ENCOUNTER — Other Ambulatory Visit: Payer: Self-pay | Admitting: Family Medicine

## 2024-03-31 DIAGNOSIS — E1165 Type 2 diabetes mellitus with hyperglycemia: Secondary | ICD-10-CM

## 2024-04-01 DIAGNOSIS — E871 Hypo-osmolality and hyponatremia: Secondary | ICD-10-CM | POA: Diagnosis not present

## 2024-04-01 DIAGNOSIS — N189 Chronic kidney disease, unspecified: Secondary | ICD-10-CM | POA: Diagnosis not present

## 2024-04-01 DIAGNOSIS — I129 Hypertensive chronic kidney disease with stage 1 through stage 4 chronic kidney disease, or unspecified chronic kidney disease: Secondary | ICD-10-CM | POA: Diagnosis not present

## 2024-04-01 DIAGNOSIS — D631 Anemia in chronic kidney disease: Secondary | ICD-10-CM | POA: Diagnosis not present

## 2024-04-01 DIAGNOSIS — C9000 Multiple myeloma not having achieved remission: Secondary | ICD-10-CM | POA: Diagnosis not present

## 2024-04-01 DIAGNOSIS — N179 Acute kidney failure, unspecified: Secondary | ICD-10-CM | POA: Diagnosis not present

## 2024-04-01 DIAGNOSIS — R809 Proteinuria, unspecified: Secondary | ICD-10-CM | POA: Diagnosis not present

## 2024-04-01 DIAGNOSIS — N390 Urinary tract infection, site not specified: Secondary | ICD-10-CM | POA: Diagnosis not present

## 2024-04-01 DIAGNOSIS — E1122 Type 2 diabetes mellitus with diabetic chronic kidney disease: Secondary | ICD-10-CM | POA: Diagnosis not present

## 2024-04-04 LAB — LAB REPORT - SCANNED: Creatinine, POC: 32.9 mg/dL

## 2024-04-06 ENCOUNTER — Inpatient Hospital Stay

## 2024-04-06 ENCOUNTER — Inpatient Hospital Stay: Admitting: Physician Assistant

## 2024-04-06 VITALS — BP 127/57 | HR 86 | Temp 97.7°F | Resp 18 | Ht 61.0 in | Wt 115.5 lb

## 2024-04-06 DIAGNOSIS — Z5111 Encounter for antineoplastic chemotherapy: Secondary | ICD-10-CM | POA: Diagnosis not present

## 2024-04-06 DIAGNOSIS — Z79899 Other long term (current) drug therapy: Secondary | ICD-10-CM | POA: Diagnosis not present

## 2024-04-06 DIAGNOSIS — Z79620 Long term (current) use of immunosuppressive biologic: Secondary | ICD-10-CM | POA: Diagnosis not present

## 2024-04-06 DIAGNOSIS — C9000 Multiple myeloma not having achieved remission: Secondary | ICD-10-CM

## 2024-04-06 DIAGNOSIS — Z5112 Encounter for antineoplastic immunotherapy: Secondary | ICD-10-CM | POA: Diagnosis not present

## 2024-04-06 LAB — CBC WITH DIFFERENTIAL (CANCER CENTER ONLY)
Abs Immature Granulocytes: 0.02 10*3/uL (ref 0.00–0.07)
Basophils Absolute: 0 10*3/uL (ref 0.0–0.1)
Basophils Relative: 0 %
Eosinophils Absolute: 0 10*3/uL (ref 0.0–0.5)
Eosinophils Relative: 1 %
HCT: 28.4 % — ABNORMAL LOW (ref 36.0–46.0)
Hemoglobin: 9.6 g/dL — ABNORMAL LOW (ref 12.0–15.0)
Immature Granulocytes: 0 %
Lymphocytes Relative: 12 %
Lymphs Abs: 0.8 10*3/uL (ref 0.7–4.0)
MCH: 32.8 pg (ref 26.0–34.0)
MCHC: 33.8 g/dL (ref 30.0–36.0)
MCV: 96.9 fL (ref 80.0–100.0)
Monocytes Absolute: 0.7 10*3/uL (ref 0.1–1.0)
Monocytes Relative: 10 %
Neutro Abs: 5.3 10*3/uL (ref 1.7–7.7)
Neutrophils Relative %: 77 %
Platelet Count: 210 10*3/uL (ref 150–400)
RBC: 2.93 MIL/uL — ABNORMAL LOW (ref 3.87–5.11)
RDW: 16.8 % — ABNORMAL HIGH (ref 11.5–15.5)
WBC Count: 6.9 10*3/uL (ref 4.0–10.5)
nRBC: 0 % (ref 0.0–0.2)

## 2024-04-06 LAB — CMP (CANCER CENTER ONLY)
ALT: 36 U/L (ref 0–44)
AST: 23 U/L (ref 15–41)
Albumin: 3.1 g/dL — ABNORMAL LOW (ref 3.5–5.0)
Alkaline Phosphatase: 56 U/L (ref 38–126)
Anion gap: 4 — ABNORMAL LOW (ref 5–15)
BUN: 69 mg/dL — ABNORMAL HIGH (ref 8–23)
CO2: 25 mmol/L (ref 22–32)
Calcium: 8.3 mg/dL — ABNORMAL LOW (ref 8.9–10.3)
Chloride: 106 mmol/L (ref 98–111)
Creatinine: 1.17 mg/dL — ABNORMAL HIGH (ref 0.44–1.00)
GFR, Estimated: 48 mL/min — ABNORMAL LOW (ref >60)
Glucose, Bld: 126 mg/dL — ABNORMAL HIGH (ref 70–99)
Potassium: 4 mmol/L (ref 3.5–5.1)
Sodium: 135 mmol/L (ref 135–145)
Total Bilirubin: 0.2 mg/dL (ref 0.0–1.2)
Total Protein: 6.7 g/dL (ref 6.5–8.1)

## 2024-04-06 MED ORDER — BORTEZOMIB CHEMO SQ INJECTION 3.5 MG (2.5MG/ML)
1.5000 mg/m2 | Freq: Once | INTRAMUSCULAR | Status: AC
  Administered 2024-04-06: 2.25 mg via SUBCUTANEOUS
  Filled 2024-04-06: qty 0.9

## 2024-04-06 MED ORDER — DARATUMUMAB-HYALURONIDASE-FIHJ 1800-30000 MG-UT/15ML ~~LOC~~ SOLN
1800.0000 mg | Freq: Once | SUBCUTANEOUS | Status: AC
  Administered 2024-04-06: 1800 mg via SUBCUTANEOUS
  Filled 2024-04-06: qty 15

## 2024-04-06 MED ORDER — DIPHENHYDRAMINE HCL 25 MG PO CAPS
25.0000 mg | ORAL_CAPSULE | Freq: Once | ORAL | Status: AC
  Administered 2024-04-06: 25 mg via ORAL
  Filled 2024-04-06: qty 1

## 2024-04-06 MED ORDER — ACETAMINOPHEN 325 MG PO TABS
650.0000 mg | ORAL_TABLET | Freq: Once | ORAL | Status: AC
  Administered 2024-04-06: 650 mg via ORAL
  Filled 2024-04-06: qty 2

## 2024-04-06 MED ORDER — DEXAMETHASONE 4 MG PO TABS
20.0000 mg | ORAL_TABLET | Freq: Once | ORAL | Status: AC
  Administered 2024-04-06: 20 mg via ORAL
  Filled 2024-04-06: qty 5

## 2024-04-06 NOTE — Progress Notes (Signed)
 HEMATOLOGY/ONCOLOGY CLINIC NOTE  Date of Service: 04/06/24   Patient Care Team: Zola Button, Grayling Congress, DO as PCP - General Wendall Stade, MD as PCP - Cardiology (Cardiology) Mckinley Jewel, MD as Consulting Physician (Ophthalmology) Hart Carwin, MD (Inactive) (Gastroenterology) Kathryne Hitch, MD as Consulting Physician (Orthopedic Surgery) Cherlyn Roberts, MD as Referring Physician (Dermatology) Genia Del, MD as Consulting Physician (Obstetrics and Gynecology) Johney Maine, MD as Consulting Physician (Hematology)  CHIEF COMPLAINTS/PURPOSE OF CONSULTATION:  Multiple myeloma   INTERVAL HISTORY:  Cynthia Burns is a 78 y.o. female here for evaluation and management of multiple myeloma. She is here for cycle 2 day 8 of her treatment.  She is accompanied by her husband for this visit and her daughter was on the phone.  Cynthia Burns reports her energy levels are fairly stable.  She still does have some agitation and insomnia after dose reduction of her steroids.  She is otherwise doing well.  She denies any nausea, vomiting or bowel habit changes.  She denies easy bruising or signs of active bleeding.  She denies any new bone pain or back pain.  She denies fevers, chills, sweats, shortness of breath, chest pain or cough.  She has no other complaints  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   HSV-1 infection    HYPERTENSION    Hypothyroid    MITRAL VALVE PROLAPSE    OSTEOPENIA    Overweight(278.02)    PHARYNGITIS, ACUTE     SURGICAL HISTORY: Past Surgical History:  Procedure Laterality Date   ABDOMINAL HYSTERECTOMY     CHOLECYSTECTOMY     prolped bladder      WISDOM TOOTH EXTRACTION      SOCIAL HISTORY: Social History   Socioeconomic History   Marital status: Married    Spouse name: Not on file   Number of children: Not on file   Years of education: Not on file   Highest education level: Associate degree: occupational, Scientist, product/process development, or vocational program  Occupational History   Occupation: hairdresser  Tobacco Use   Smoking status: Never   Smokeless tobacco: Never  Vaping Use   Vaping status: Never Used  Substance and Sexual Activity   Alcohol use: No    Alcohol/week: 0.0 standard drinks of alcohol   Drug use: No   Sexual activity: Not Currently    Partners: Male    Birth control/protection: Surgical, Abstinence    Comment: hysterectomy, 17, less than 5  Other Topics Concern   Not on file  Social History Narrative   Not on file   Social Drivers of Health   Financial Resource Strain: Low Risk  (12/07/2023)   Overall Financial Resource Strain (CARDIA)    Difficulty of Paying Living Expenses: Not hard at all  Food Insecurity: Patient Declined (02/29/2024)   Hunger Vital Sign    Worried About Running Out of Food in the Last Year: Patient declined    Ran Out of Food in the Last Year: Patient declined  Transportation Needs: No Transportation Needs (02/29/2024)   PRAPARE - Administrator, Civil Service (Medical): No  Lack of Transportation (Non-Medical): No  Physical Activity: Insufficiently Active (12/07/2023)   Exercise Vital Sign    Days of Exercise per Week: 1 day    Minutes of Exercise per Session: 10 min  Stress: No Stress Concern Present (12/07/2023)   [employer] of Occupational Health - Occupational Stress Questionnaire    Feeling of Stress : Only a little  Social Connections: Socially Integrated (02/29/2024)   Social Connection and Isolation Panel [NHANES]    Frequency of Communication with Friends and Family: More than three times a week    Frequency of Social Gatherings with Friends and Family: More  than three times a week    Attends Religious Services: More than 4 times per year    Active Member of [employer] or Organizations: Yes    Attends Engineer, structural: More than 4 times per year    Marital Status: Married  Catering manager Violence: Not At Risk (02/29/2024)   Humiliation, Afraid, Rape, and Kick questionnaire    Fear of Current or Ex-Partner: No    Emotionally Abused: No    Physically Abused: No    Sexually Abused: No    FAMILY HISTORY: Family History  Problem Relation Age of Onset   Hypertension Mother    Heart disease Mother        Had a stent   Emphysema Father    Diabetes Father    COPD Father    Stroke Sister    Breast cancer Neg Hx     ALLERGIES:  is allergic to mucinex [guaifenesin er], bystolic [nebivolol hcl], calcium-containing compounds, codeine, paxlovid [nirmatrelvir-ritonavir], advicor [niacin-lovastatin er], amlodipine, lisinopril-hydrochlorothiazide, other, ramipril, and sulfonamide derivatives.  MEDICATIONS:  Current Outpatient Medications  Medication Sig Dispense Refill   acyclovir (ZOVIRAX) 400 MG tablet Take 1 tablet (400 mg total) by mouth 2 (two) times daily. 60 tablet 5   amoxicillin (AMOXIL) 500 MG capsule Take 2,000 mg by mouth See admin instructions. Take 2,000 mg by mouth one hour prior to dental visits     Biotin 5000 MCG CAPS Take 5,000 mcg by mouth daily.     Blood Glucose Monitoring Suppl (ONE TOUCH ULTRA 2) w/Device KIT CHECK BLOOD SUGAR TWICE DAILY.  DX CODE  E11.9 1 kit 0   Cholecalciferol (VITAMIN D3) 1000 units CAPS Take 1,000 Units by mouth daily.     clonazePAM (KLONOPIN) 0.5 MG tablet Take 1 tablet (0.5 mg total) by mouth 2 (two) times daily as needed for anxiety. 30 tablet 0   Cyanocobalamin (VITAMIN B-12) 3000 MCG SUBL Place 3,000 mcg under the tongue daily in the afternoon.     dexamethasone (DECADRON) 4 MG tablet Take 5 tablets (20 mg) by mouth with breakfast the day after every daratumumab dose 30 tablet 1    fexofenadine-pseudoephedrine (ALLEGRA-D 24) 180-240 MG 24 hr tablet Take 1 tablet by mouth daily.     glucose blood (ONETOUCH ULTRA TEST) test strip Use as instructed 200 each 3   [Paused] hydrALAZINE (APRESOLINE) 25 MG tablet Take 25 mg by mouth See admin instructions. Take 25 mg by mouth at 8 AM, 3 PM, and 11 PM- in conjunction with one 50 mg tablet to equal a total dose of 75 mg     insulin aspart (NOVOLOG FLEXPEN) 100 UNIT/ML FlexPen Per sliding scale max 40 u daily (Patient taking differently: 2-8 Units See admin instructions. Inject 2-8 units into the skin three times a day with meals, PER SLIDING SCALE) 15 mL 11   Insulin Pen Needle (  B-D UF III MINI PEN NEEDLES) 31G X 5 MM MISC AS DIRECTED 100 each 2   Lancets (ONETOUCH DELICA PLUS LANCET33G) MISC USE 1 LANCET TO TEST AS DIRECTED (DISCARD LANCET IN SHARPS CONTAINER IMMEDIATELY AFTER USE) 200 each 1   levothyroxine (SYNTHROID) 25 MCG tablet TAKE ONE TABLET BY MOUTH EVERY DAY BEFORE BREAKFAST (Patient taking differently: Take 25 mcg by mouth daily before breakfast.) 90 tablet 1   Multiple Vitamins-Minerals (PRESERVISION AREDS 2) CAPS Take 1 capsule by mouth 2 (two) times daily with a meal.     ondansetron (ZOFRAN) 8 MG tablet Take 8 mg by mouth 30 to 60 min prior to Cyclophosphamide administration then take 8 mg every 8 hrs as needed for nausea and vomiting. 30 tablet 1   pantoprazole (PROTONIX) 40 MG tablet Pantoprazole 40 mg before breakfast and dinner for 90 days with 3 refills (Patient taking differently: Take 40 mg by mouth See admin instructions. Take 40 mg by mouth 30 minutes before lunch and supper) 180 tablet 3   prochlorperazine (COMPAZINE) 10 MG tablet Take 1 tablet (10 mg total) by mouth every 6 (six) hours as needed for nausea or vomiting. 30 tablet 1   rosuvastatin (CRESTOR) 20 MG tablet TAKE ONE TABLET BY MOUTH EVERY DAY 90 tablet 3   Semaglutide (RYBELSUS) 7 MG TABS Take 1 tablet (7 mg total) by mouth daily. 90 tablet 0   SYSTANE  ULTRA PF 0.4-0.3 % SOLN Place 1 drop into both eyes See admin instructions. Instill 1 drop into both eyes one to four times a day     UNKNOWN TO PATIENT Place 1 spray into both nostrils See admin instructions. Unnamed nasal emollient- Instill 1 spray into each nostril one to two times a day as needed for dry nasal passages     No current facility-administered medications for this visit.    REVIEW OF SYSTEMS:    10 Point review of Systems was done is negative except as noted above.  PHYSICAL EXAMINATION:  ECOG PERFORMANCE STATUS: 1 - Symptomatic but completely ambulatory  Vitals:   04/06/24 0839  BP: (!) 127/57  Pulse: 86  Resp: 18  Temp: 97.7 F (36.5 C)  SpO2: 100%    Filed Weights   04/06/24 0839  Weight: 115 lb 8 oz (52.4 kg)    .Body mass index is 21.82 kg/m.  GENERAL:alert, in no acute distress and comfortable SKIN: no acute rashes, no significant lesions EYES: conjunctiva are pink and non-injected, sclera anicteric LUNGS: clear to auscultation b/l with normal respiratory effort HEART: regular rate & rhythm Extremity: no pedal edema PSYCH: alert & oriented x 3 with fluent speech NEURO: no focal motor/sensory deficits  LABORATORY DATA:  I have reviewed the data as listed  .    Latest Ref Rng & Units 04/06/2024    8:19 AM 03/29/2024    8:14 AM 03/22/2024   11:07 AM  CBC  WBC 4.0 - 10.5 K/uL 6.9  7.9  5.4   Hemoglobin 12.0 - 15.0 g/dL 9.6  9.5  9.3   Hematocrit 36.0 - 46.0 % 28.4  28.6  27.6   Platelets 150 - 400 K/uL 210  284  265        Latest Ref Rng & Units 04/06/2024    8:19 AM 03/29/2024    8:14 AM 03/15/2024    8:26 AM  CMP  Glucose 70 - 99 mg/dL 161  096  045   BUN 8 - 23 mg/dL 69  86  409  Creatinine 0.44 - 1.00 mg/dL 1.61  0.96  0.45   Sodium 135 - 145 mmol/L 135  132  125   Potassium 3.5 - 5.1 mmol/L 4.0  3.7  4.1   Chloride 98 - 111 mmol/L 106  103  101   CO2 22 - 32 mmol/L 25  24  21    Calcium 8.9 - 10.3 mg/dL 8.3  8.7  8.9   Total Protein  6.5 - 8.1 g/dL 6.7  8.3  40.9   Total Bilirubin 0.0 - 1.2 mg/dL 0.2  0.3  0.3   Alkaline Phos 38 - 126 U/L 56  49  42   AST 15 - 41 U/L 23  23  35   ALT 0 - 44 U/L 36  44  71    Bone marrow biopsy 02/01/2024:    Surgical pathology 01/29/2024:    RADIOGRAPHIC STUDIES: I have personally reviewed the radiological images as listed and agreed with the findings in the report. No results found.   ASSESSMENT & PLAN:  78 y.o. female with:  #Multiple myeloma-Lambda restricted plasma cell neoplasm comprising greater than 75% of the cellular marrow: --Due for cycle 2-day 8 of Velcade, daratumumab plus dexamethasone --Labs from today were reviewed and adequate for treatment.  WBC 6.9, hemoglobin 9.6, platelets 210, creatinine has improved to 1.17.  LFTs normal. --Proceed with treatment without any dose modifications. --Patient has decreased her dexamethasone to 4 tablets (16 mg) this past week.  Advised her that if she continues to have agitation and insomnia, consider decreasing it to 3 tablets (12 mg). --Will obtain repeat myeloma labs next week to evaluate treatment response.  #Hyponatremia  --Recent thyroid function tests WNL. --Could be pseudohyponatremia from monoclonal paraproteinemia. 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  FOLLOW-UP: - Return to the clinic for a follow-up visit in 2 weeks for continued evaluation  All of the patient's questions were answered with apparent satisfaction. The patient knows to call the clinic with any problems, questions or concerns.   I have spent a total of 30 minutes minutes of face-to-face and non-face-to-face time, preparing to see the patient,  performing a medically appropriate examination, counseling and educating the patient,documenting clinical information in the electronic health record, independently interpreting results and communicating results to the  patient, and care coordination.   Wyline Hearing PA-C Dept of Hematology and Oncology [facility] Phone: [phone]

## 2024-04-06 NOTE — Patient Instructions (Signed)
 [facility]   Discharge Instructions: Thank you for choosing [facility] to provide your oncology and hematology care.   If you have a lab appointment with the Cancer Center, please go directly to the Cancer Center and check in at the registration area.   Wear comfortable clothing and clothing appropriate for easy access to any Portacath or PICC line.   We strive to give you quality time with your provider. You may need to reschedule your appointment if you arrive late (15 or more minutes).  Arriving late affects you and other patients whose appointments are after yours.  Also, if you miss three or more appointments without notifying the office, you may be dismissed from the clinic at the provider's discretion.      For prescription refill requests, have your pharmacy contact our office and allow 72 hours for refills to be completed.    Today you received the following chemotherapy and/or immunotherapy agents: Daratumumab hyaluronidase (Darzalex faspro) and Bortezomib (Velcade)      To help prevent nausea and vomiting after your treatment, we encourage you to take your nausea medication as directed.  BELOW ARE SYMPTOMS THAT SHOULD BE REPORTED IMMEDIATELY: *FEVER GREATER THAN 100.4 F (38 C) OR HIGHER *CHILLS OR SWEATING *NAUSEA AND VOMITING THAT IS NOT CONTROLLED WITH YOUR NAUSEA MEDICATION *UNUSUAL SHORTNESS OF BREATH *UNUSUAL BRUISING OR BLEEDING *URINARY PROBLEMS (pain or burning when urinating, or frequent urination) *BOWEL PROBLEMS (unusual diarrhea, constipation, pain near the anus) TENDERNESS IN MOUTH AND THROAT WITH OR WITHOUT PRESENCE OF ULCERS (sore throat, sores in mouth, or a toothache) UNUSUAL RASH, SWELLING OR PAIN  UNUSUAL VAGINAL DISCHARGE OR ITCHING   Items with * indicate a potential emergency and should be followed up as soon as possible or go to the Emergency Department if any problems should  occur.  Please show the CHEMOTHERAPY ALERT CARD or IMMUNOTHERAPY ALERT CARD at check-in to the Emergency Department and triage nurse.  Should you have questions after your visit or need to cancel or reschedule your appointment, please contact [facility] - [facility] DEPT [facility]  Dept: [phone]  and follow the prompts.  Office hours are 8:00 a.m. to 4:30 p.m. Monday - Friday. Please note that voicemails left after 4:00 p.m. may not be returned until the following business day.  We are closed weekends and major holidays. You have access to a nurse at all times for urgent questions. Please call the main number to the clinic Dept: [phone] and follow the prompts.   For any non-urgent questions, you may also contact your provider using MyChart. We now offer e-Visits for anyone 61 and older to request care online for non-urgent symptoms. For details visit [email or web address].   Also download the MyChart app! Go to the app store, search "MyChart", open the app, select [place], and log in with your MyChart username and password.

## 2024-04-07 ENCOUNTER — Encounter: Payer: Self-pay | Admitting: Nephrology

## 2024-04-08 ENCOUNTER — Other Ambulatory Visit: Payer: Self-pay

## 2024-04-12 ENCOUNTER — Inpatient Hospital Stay

## 2024-04-12 VITALS — BP 123/62 | HR 77 | Temp 97.8°F | Resp 18 | Wt 117.0 lb

## 2024-04-12 DIAGNOSIS — C9000 Multiple myeloma not having achieved remission: Secondary | ICD-10-CM | POA: Diagnosis not present

## 2024-04-12 DIAGNOSIS — Z79620 Long term (current) use of immunosuppressive biologic: Secondary | ICD-10-CM | POA: Diagnosis not present

## 2024-04-12 DIAGNOSIS — Z5112 Encounter for antineoplastic immunotherapy: Secondary | ICD-10-CM | POA: Diagnosis not present

## 2024-04-12 DIAGNOSIS — Z79899 Other long term (current) drug therapy: Secondary | ICD-10-CM | POA: Diagnosis not present

## 2024-04-12 LAB — CMP (CANCER CENTER ONLY)
ALT: 40 U/L (ref 0–44)
AST: 23 U/L (ref 15–41)
Albumin: 2.9 g/dL — ABNORMAL LOW (ref 3.5–5.0)
Alkaline Phosphatase: 55 U/L (ref 38–126)
Anion gap: 5 (ref 5–15)
BUN: 76 mg/dL — ABNORMAL HIGH (ref 8–23)
CO2: 24 mmol/L (ref 22–32)
Calcium: 8.1 mg/dL — ABNORMAL LOW (ref 8.9–10.3)
Chloride: 105 mmol/L (ref 98–111)
Creatinine: 1.17 mg/dL — ABNORMAL HIGH (ref 0.44–1.00)
GFR, Estimated: 48 mL/min — ABNORMAL LOW (ref >60)
Glucose, Bld: 210 mg/dL — ABNORMAL HIGH (ref 70–99)
Potassium: 3.9 mmol/L (ref 3.5–5.1)
Sodium: 134 mmol/L — ABNORMAL LOW (ref 135–145)
Total Bilirubin: 0.2 mg/dL (ref 0.0–1.2)
Total Protein: 6 g/dL — ABNORMAL LOW (ref 6.5–8.1)

## 2024-04-12 LAB — CBC WITH DIFFERENTIAL (CANCER CENTER ONLY)
Abs Immature Granulocytes: 0.03 10*3/uL (ref 0.00–0.07)
Basophils Absolute: 0 10*3/uL (ref 0.0–0.1)
Basophils Relative: 0 %
Eosinophils Absolute: 0.1 10*3/uL (ref 0.0–0.5)
Eosinophils Relative: 1 %
HCT: 28 % — ABNORMAL LOW (ref 36.0–46.0)
Hemoglobin: 9.6 g/dL — ABNORMAL LOW (ref 12.0–15.0)
Immature Granulocytes: 0 %
Lymphocytes Relative: 16 %
Lymphs Abs: 1.3 10*3/uL (ref 0.7–4.0)
MCH: 32.8 pg (ref 26.0–34.0)
MCHC: 34.3 g/dL (ref 30.0–36.0)
MCV: 95.6 fL (ref 80.0–100.0)
Monocytes Absolute: 0.5 10*3/uL (ref 0.1–1.0)
Monocytes Relative: 7 %
Neutro Abs: 6.3 10*3/uL (ref 1.7–7.7)
Neutrophils Relative %: 76 %
Platelet Count: 255 10*3/uL (ref 150–400)
RBC: 2.93 MIL/uL — ABNORMAL LOW (ref 3.87–5.11)
RDW: 16.4 % — ABNORMAL HIGH (ref 11.5–15.5)
WBC Count: 8.2 10*3/uL (ref 4.0–10.5)
nRBC: 0 % (ref 0.0–0.2)

## 2024-04-12 MED ORDER — DIPHENHYDRAMINE HCL 25 MG PO CAPS
25.0000 mg | ORAL_CAPSULE | Freq: Once | ORAL | Status: AC
Start: 2024-04-12 — End: 2024-04-12
  Administered 2024-04-12: 25 mg via ORAL
  Filled 2024-04-12: qty 1

## 2024-04-12 MED ORDER — BORTEZOMIB CHEMO SQ INJECTION 3.5 MG (2.5MG/ML)
1.5000 mg/m2 | Freq: Once | INTRAMUSCULAR | Status: AC
  Administered 2024-04-12: 2.25 mg via SUBCUTANEOUS
  Filled 2024-04-12: qty 0.9

## 2024-04-12 MED ORDER — DARATUMUMAB-HYALURONIDASE-FIHJ 1800-30000 MG-UT/15ML ~~LOC~~ SOLN
1800.0000 mg | Freq: Once | SUBCUTANEOUS | Status: AC
  Administered 2024-04-12: 1800 mg via SUBCUTANEOUS
  Filled 2024-04-12: qty 15

## 2024-04-12 MED ORDER — ACETAMINOPHEN 325 MG PO TABS
650.0000 mg | ORAL_TABLET | Freq: Once | ORAL | Status: AC
  Administered 2024-04-12: 650 mg via ORAL
  Filled 2024-04-12: qty 2

## 2024-04-12 MED ORDER — DEXAMETHASONE 4 MG PO TABS
20.0000 mg | ORAL_TABLET | Freq: Once | ORAL | Status: AC
  Administered 2024-04-12: 20 mg via ORAL
  Filled 2024-04-12: qty 5

## 2024-04-12 NOTE — Patient Instructions (Signed)
 [facility]  Discharge Instructions: Thank you for choosing [facility] to provide your oncology and hematology care.   If you have a lab appointment with the Cancer Center, please go directly to the Cancer Center and check in at the registration area.   Wear comfortable clothing and clothing appropriate for easy access to any Portacath or PICC line.   We strive to give you quality time with your provider. You may need to reschedule your appointment if you arrive late (15 or more minutes).  Arriving late affects you and other patients whose appointments are after yours.  Also, if you miss three or more appointments without notifying the office, you may be dismissed from the clinic at the provider's discretion.      For prescription refill requests, have your pharmacy contact our office and allow 72 hours for refills to be completed.    Today you received the following chemotherapy and/or immunotherapy agents: Velcade/Darzalex Faspro      To help prevent nausea and vomiting after your treatment, we encourage you to take your nausea medication as directed.  BELOW ARE SYMPTOMS THAT SHOULD BE REPORTED IMMEDIATELY: *FEVER GREATER THAN 100.4 F (38 C) OR HIGHER *CHILLS OR SWEATING *NAUSEA AND VOMITING THAT IS NOT CONTROLLED WITH YOUR NAUSEA MEDICATION *UNUSUAL SHORTNESS OF BREATH *UNUSUAL BRUISING OR BLEEDING *URINARY PROBLEMS (pain or burning when urinating, or frequent urination) *BOWEL PROBLEMS (unusual diarrhea, constipation, pain near the anus) TENDERNESS IN MOUTH AND THROAT WITH OR WITHOUT PRESENCE OF ULCERS (sore throat, sores in mouth, or a toothache) UNUSUAL RASH, SWELLING OR PAIN  UNUSUAL VAGINAL DISCHARGE OR ITCHING   Items with * indicate a potential emergency and should be followed up as soon as possible or go to the Emergency Department if any problems should occur.  Please show the CHEMOTHERAPY ALERT CARD or  IMMUNOTHERAPY ALERT CARD at check-in to the Emergency Department and triage nurse.  Should you have questions after your visit or need to cancel or reschedule your appointment, please contact [facility] - A DEPT OF [facility]  Dept: [phone]  and follow the prompts.  Office hours are 8:00 a.m. to 4:30 p.m. Monday - Friday. Please note that voicemails left after 4:00 p.m. may not be returned until the following business day.  We are closed weekends and major holidays. You have access to a nurse at all times for urgent questions. Please call the main number to the clinic Dept: [phone] and follow the prompts.   For any non-urgent questions, you may also contact your provider using MyChart. We now offer e-Visits for anyone 25 and older to request care online for non-urgent symptoms. For details visit [email or web address].   Also download the MyChart app! Go to the app store, search "MyChart", open the app, select [place], and log in with your MyChart username and password.

## 2024-04-13 ENCOUNTER — Other Ambulatory Visit: Payer: Self-pay | Admitting: Family Medicine

## 2024-04-13 DIAGNOSIS — E039 Hypothyroidism, unspecified: Secondary | ICD-10-CM

## 2024-04-13 DIAGNOSIS — E1165 Type 2 diabetes mellitus with hyperglycemia: Secondary | ICD-10-CM

## 2024-04-13 LAB — KAPPA/LAMBDA LIGHT CHAINS
Kappa free light chain: 3.6 mg/L (ref 3.3–19.4)
Kappa, lambda light chain ratio: 0.12 — ABNORMAL LOW (ref 0.26–1.65)
Lambda free light chains: 29.8 mg/L — ABNORMAL HIGH (ref 5.7–26.3)

## 2024-04-14 LAB — MULTIPLE MYELOMA PANEL, SERUM
Albumin SerPl Elph-Mcnc: 2.8 g/dL — ABNORMAL LOW (ref 2.9–4.4)
Albumin/Glob SerPl: 1 (ref 0.7–1.7)
Alpha 1: 0.3 g/dL (ref 0.0–0.4)
Alpha2 Glob SerPl Elph-Mcnc: 0.8 g/dL (ref 0.4–1.0)
B-Globulin SerPl Elph-Mcnc: 0.8 g/dL (ref 0.7–1.3)
Gamma Glob SerPl Elph-Mcnc: 1.3 g/dL (ref 0.4–1.8)
Globulin, Total: 3.1 g/dL (ref 2.2–3.9)
IgA: <5 mg/dL — ABNORMAL LOW (ref 64–422)
IgG (Immunoglobin G), Serum: 1653 mg/dL — ABNORMAL HIGH (ref 586–1602)
IgM (Immunoglobulin M), Srm: 10 mg/dL — ABNORMAL LOW (ref 26–217)
M Protein SerPl Elph-Mcnc: 1.1 g/dL — ABNORMAL HIGH
Total Protein ELP: 5.9 g/dL — ABNORMAL LOW (ref 6.0–8.5)

## 2024-04-15 ENCOUNTER — Encounter: Payer: Medicare Other | Admitting: Gastroenterology

## 2024-04-18 ENCOUNTER — Encounter: Payer: Self-pay | Admitting: Physician Assistant

## 2024-04-19 ENCOUNTER — Inpatient Hospital Stay

## 2024-04-19 ENCOUNTER — Other Ambulatory Visit: Payer: Self-pay

## 2024-04-19 ENCOUNTER — Inpatient Hospital Stay (HOSPITAL_BASED_OUTPATIENT_CLINIC_OR_DEPARTMENT_OTHER): Admitting: Hematology

## 2024-04-19 ENCOUNTER — Inpatient Hospital Stay: Admitting: Dietician

## 2024-04-19 VITALS — BP 131/59 | HR 73 | Temp 97.5°F | Resp 16 | Ht 61.0 in | Wt 116.9 lb

## 2024-04-19 DIAGNOSIS — C9000 Multiple myeloma not having achieved remission: Secondary | ICD-10-CM

## 2024-04-19 DIAGNOSIS — Z79620 Long term (current) use of immunosuppressive biologic: Secondary | ICD-10-CM | POA: Diagnosis not present

## 2024-04-19 DIAGNOSIS — Z5111 Encounter for antineoplastic chemotherapy: Secondary | ICD-10-CM

## 2024-04-19 DIAGNOSIS — Z5112 Encounter for antineoplastic immunotherapy: Secondary | ICD-10-CM | POA: Diagnosis not present

## 2024-04-19 DIAGNOSIS — Z79899 Other long term (current) drug therapy: Secondary | ICD-10-CM | POA: Diagnosis not present

## 2024-04-19 LAB — CMP (CANCER CENTER ONLY)
ALT: 44 U/L (ref 0–44)
AST: 29 U/L (ref 15–41)
Albumin: 3.2 g/dL — ABNORMAL LOW (ref 3.5–5.0)
Alkaline Phosphatase: 49 U/L (ref 38–126)
Anion gap: 5 (ref 5–15)
BUN: 68 mg/dL — ABNORMAL HIGH (ref 8–23)
CO2: 26 mmol/L (ref 22–32)
Calcium: 8.4 mg/dL — ABNORMAL LOW (ref 8.9–10.3)
Chloride: 106 mmol/L (ref 98–111)
Creatinine: 1.12 mg/dL — ABNORMAL HIGH (ref 0.44–1.00)
GFR, Estimated: 51 mL/min — ABNORMAL LOW (ref >60)
Glucose, Bld: 156 mg/dL — ABNORMAL HIGH (ref 70–99)
Potassium: 3.9 mmol/L (ref 3.5–5.1)
Sodium: 137 mmol/L (ref 135–145)
Total Bilirubin: 0.2 mg/dL (ref 0.0–1.2)
Total Protein: 6 g/dL — ABNORMAL LOW (ref 6.5–8.1)

## 2024-04-19 LAB — CBC WITH DIFFERENTIAL (CANCER CENTER ONLY)
Abs Immature Granulocytes: 0.02 10*3/uL (ref 0.00–0.07)
Basophils Absolute: 0 10*3/uL (ref 0.0–0.1)
Basophils Relative: 0 %
Eosinophils Absolute: 0.1 10*3/uL (ref 0.0–0.5)
Eosinophils Relative: 1 %
HCT: 29.5 % — ABNORMAL LOW (ref 36.0–46.0)
Hemoglobin: 10 g/dL — ABNORMAL LOW (ref 12.0–15.0)
Immature Granulocytes: 0 %
Lymphocytes Relative: 16 %
Lymphs Abs: 1.3 10*3/uL (ref 0.7–4.0)
MCH: 32.2 pg (ref 26.0–34.0)
MCHC: 33.9 g/dL (ref 30.0–36.0)
MCV: 94.9 fL (ref 80.0–100.0)
Monocytes Absolute: 0.5 10*3/uL (ref 0.1–1.0)
Monocytes Relative: 6 %
Neutro Abs: 6.4 10*3/uL (ref 1.7–7.7)
Neutrophils Relative %: 77 %
Platelet Count: 270 10*3/uL (ref 150–400)
RBC: 3.11 MIL/uL — ABNORMAL LOW (ref 3.87–5.11)
RDW: 16.3 % — ABNORMAL HIGH (ref 11.5–15.5)
WBC Count: 8.3 10*3/uL (ref 4.0–10.5)
nRBC: 0 % (ref 0.0–0.2)

## 2024-04-19 MED ORDER — DIPHENHYDRAMINE HCL 25 MG PO CAPS
25.0000 mg | ORAL_CAPSULE | Freq: Once | ORAL | Status: AC
  Administered 2024-04-19: 25 mg via ORAL
  Filled 2024-04-19: qty 1

## 2024-04-19 MED ORDER — ACETAMINOPHEN 325 MG PO TABS
650.0000 mg | ORAL_TABLET | Freq: Once | ORAL | Status: AC
  Administered 2024-04-19: 650 mg via ORAL
  Filled 2024-04-19: qty 2

## 2024-04-19 MED ORDER — DARATUMUMAB-HYALURONIDASE-FIHJ 1800-30000 MG-UT/15ML ~~LOC~~ SOLN
1800.0000 mg | Freq: Once | SUBCUTANEOUS | Status: AC
  Administered 2024-04-19: 1800 mg via SUBCUTANEOUS
  Filled 2024-04-19: qty 15

## 2024-04-19 MED ORDER — DEXAMETHASONE 4 MG PO TABS
20.0000 mg | ORAL_TABLET | Freq: Once | ORAL | Status: AC
  Administered 2024-04-19: 20 mg via ORAL
  Filled 2024-04-19: qty 5

## 2024-04-19 MED ORDER — CLONAZEPAM 0.5 MG PO TABS: 0.5000 mg | ORAL_TABLET | Freq: Two times a day (BID) | ORAL | 0 refills | Status: DC | PRN

## 2024-04-19 MED ORDER — BORTEZOMIB CHEMO SQ INJECTION 3.5 MG (2.5MG/ML)
1.5000 mg/m2 | Freq: Once | INTRAMUSCULAR | Status: AC
Start: 2024-04-19 — End: 2024-04-19
  Administered 2024-04-19: 2.25 mg via SUBCUTANEOUS
  Filled 2024-04-19: qty 0.9

## 2024-04-19 NOTE — Progress Notes (Signed)
 Nutrition Follow-up:  Pt with myeloma. She is receiving DaraCyBorD q28d. Patient is under the care of Dr. Salomon Cree   Scheduled to see patient during infusion. Per infusion RN Nellie Banas), pt arrived from MD appointment one hour early. Patient had completed treatment and left for the day. RN reports patient tolerating treatment well overall. She ate a payday candy bar and drank an Ensure. Weights are trending up. No noted NIS per chart review.    Medications: klonopin  (4/29)  Labs: glucose 156, BUN 68, Cr 1.12, albumin 3.2  Anthropometrics: Wt 116 lb 14.4 oz today - increased   4/8 - 108 lb 9.6 oz   NUTRITION DIAGNOSIS: Food and nutrition related knowledge deficit - appears improving   INTERVENTION:  Continue Ensure for added calories and protein     MONITORING, EVALUATION, GOAL: wt trends, intake   NEXT VISIT: Wednesday May 14 during infusion

## 2024-04-19 NOTE — Progress Notes (Signed)
 Patient seen by Dr. Addison Naegeli are within treatment parameters.  Labs reviewed: and are within treatment parameters.  Per physician team, patient is ready for treatment and there are NO modifications to the treatment plan.

## 2024-04-19 NOTE — Progress Notes (Signed)
 HEMATOLOGY/ONCOLOGY CLINIC NOTE  Date of Service: 04/19/24   Patient Care Team: Crecencio Dodge, Candida Chalk, DO as PCP - General Loyde Rule, MD as PCP - Cardiology (Cardiology) Oris Birmingham, MD as Consulting Physician (Ophthalmology) Pietro Bridegroom, MD (Inactive) (Gastroenterology) Arnie Lao, MD as Consulting Physician (Orthopedic Surgery) Eduardo Grade, MD as Referring Physician (Dermatology) Percy Bracken, MD as Consulting Physician (Obstetrics and Gynecology) Frankie Israel, MD as Consulting Physician (Hematology)  CHIEF COMPLAINTS/PURPOSE OF CONSULTATION:  Evaluation and management of recently diagnosed Multiple myeloma   HISTORY OF PRESENTING ILLNESS:   Cynthia Burns is a wonderful 78 y.o. female who was scheduled to see Dr. Enedina Harrow as a new patient and was referred to the ED for evaluation and management of newly diagnosed myeloma with rapidly worsening hgb and renal insuffiencey with proteinuria. High suspicious of Multiple myeloma.    Outside labs done at Dr Bunnie Carol Singh's office (available in referral under media) show M spike of 6.1g/dl and Kappa light chains in the 400's.   Patient is accompanied by her husband and her daughter at bed side during the visit. Patient notes she has been having significantly more fatigue. Has required PRBC transfusions for symptomatic anemia.   She complains of fatigue, right ankle pain, and unexpected weight loss of around 25-30 lbs due to appetite loss in the past year. Her daughter notes that the patient has been confused more often as well.    She denies any new infection issues, fever, chills, back pain, chest pain, abdominal pain, or leg swelling.    Patient notes she tested positive for COVID-19 infection in August 2022 and was prescribed Paxlovid . However, she did not tolerate the medication due to allergic reaction.   PmHx of hypertension. She has been taking Losartan  and hydralazine .    Surgery history: Hysterectomy and Cholecystectomy.    She was started on dexamethasone  20 mg daily x 4 doses yesterday. She has been tolerating the high-dose steroid well.   INTERVAL HISTORY:  Cynthia Burns is a 78 y.o. female here for evaluation and management of newly diagnosed myeloma with rapidly worsening hgb and renal insufficiency with proteinuria. She is here for cycle 2 day 22 of her treatment.   Patient was last seen by me on 03/22/2024 and she complained of constipation, insomnia, and mild irritability/anxiety.   Patient was recently seen by PA Thayil on 04/06/2024 and she complained of mild agitation and mild insomnia after steroid dose reduction.   Patient is accompanied by her husband and daughter during this visit. Patient notes she has been doing well overall since our last visit. She denies any new infection issues, fever, chills, night sweats, unexpected weight loss, back pain, chest pain, abdominal pain, or leg swelling.   Patient notes she has been tolerating her current treatment well without any new or severe toxicities. She denies diarrhea, nausea, skin rashes, or neuropathy symptoms.   Patient does report mild agitation due to steroids.    She has been staying well-hydrated and eats well. Patient notes she has gained around 2-3 lbs since our last visit.   MEDICAL HISTORY:  Past Medical History:  Diagnosis Date   Abnormal Pap smear of cervix    pt not 100 percent sure but believes she did   Allergy    Anemia    Blood transfusion without reported diagnosis    Chronic kidney disease    Diabetes mellitus without complication (HCC)    Heart aneurysm    echo  done every year   HSV-1 infection    HYPERTENSION    Hypothyroid    MITRAL VALVE PROLAPSE    OSTEOPENIA    Overweight(278.02)    PHARYNGITIS, ACUTE     SURGICAL HISTORY: Past Surgical History:  Procedure Laterality Date   ABDOMINAL HYSTERECTOMY     CHOLECYSTECTOMY     prolped bladder     WISDOM  TOOTH EXTRACTION      SOCIAL HISTORY: Social History   Socioeconomic History   Marital status: Married    Spouse name: Not on file   Number of children: Not on file   Years of education: Not on file   Highest education level: Associate degree: occupational, Scientist, product/process development, or vocational program  Occupational History   Occupation: hairdresser  Tobacco Use   Smoking status: Never   Smokeless tobacco: Never  Vaping Use   Vaping status: Never Used  Substance and Sexual Activity   Alcohol use: No    Alcohol/week: 0.0 standard drinks of alcohol   Drug use: No   Sexual activity: Not Currently    Partners: Male    Birth control/protection: Surgical, Abstinence    Comment: hysterectomy, 17, less than 5  Other Topics Concern   Not on file  Social History Narrative   Not on file   Social Drivers of Health   Financial Resource Strain: Low Risk  (12/07/2023)   Overall Financial Resource Strain (CARDIA)    Difficulty of Paying Living Expenses: Not hard at all  Food Insecurity: Patient Declined (02/29/2024)   Hunger Vital Sign    Worried About Running Out of Food in the Last Year: Patient declined    Ran Out of Food in the Last Year: Patient declined  Transportation Needs: No Transportation Needs (02/29/2024)   PRAPARE - Administrator, Civil Service (Medical): No    Lack of Transportation (Non-Medical): No  Physical Activity: Insufficiently Active (12/07/2023)   Exercise Vital Sign    Days of Exercise per Week: 1 day    Minutes of Exercise per Session: 10 min  Stress: No Stress Concern Present (12/07/2023)   [employer] of Occupational Health - Occupational Stress Questionnaire    Feeling of Stress : Only a little  Social Connections: Socially Integrated (02/29/2024)   Social Connection and Isolation Panel [NHANES]    Frequency of Communication with Friends and Family: More than three times a week    Frequency of Social Gatherings with Friends and Family: More than  three times a week    Attends Religious Services: More than 4 times per year    Active Member of [employer] or Organizations: Yes    Attends Engineer, structural: More than 4 times per year    Marital Status: Married  Catering manager Violence: Not At Risk (02/29/2024)   Humiliation, Afraid, Rape, and Kick questionnaire    Fear of Current or Ex-Partner: No    Emotionally Abused: No    Physically Abused: No    Sexually Abused: No    FAMILY HISTORY: Family History  Problem Relation Age of Onset   Hypertension Mother    Heart disease Mother        Had a stent   Emphysema Father    Diabetes Father    COPD Father    Stroke Sister    Breast cancer Neg Hx     ALLERGIES:  is allergic to mucinex [guaifenesin er], bystolic  [nebivolol  hcl], calcium -containing compounds, codeine, paxlovid  [nirmatrelvir -ritonavir ], advicor [niacin-lovastatin er], amlodipine, lisinopril-hydrochlorothiazide, other,  ramipril, and sulfonamide derivatives.  MEDICATIONS:  Current Outpatient Medications  Medication Sig Dispense Refill   acyclovir  (ZOVIRAX ) 400 MG tablet Take 1 tablet (400 mg total) by mouth 2 (two) times daily. 60 tablet 5   amoxicillin  (AMOXIL ) 500 MG capsule Take 2,000 mg by mouth See admin instructions. Take 2,000 mg by mouth one hour prior to dental visits     Biotin 5000 MCG CAPS Take 5,000 mcg by mouth daily.     Blood Glucose Monitoring Suppl (ONE TOUCH ULTRA 2) w/Device KIT CHECK BLOOD SUGAR TWICE DAILY.  DX CODE  E11.9 1 kit 0   Cholecalciferol (VITAMIN D3) 1000 units CAPS Take 1,000 Units by mouth daily.     clonazePAM  (KLONOPIN ) 0.5 MG tablet Take 1 tablet (0.5 mg total) by mouth 2 (two) times daily as needed for anxiety. 30 tablet 0   Cyanocobalamin  (VITAMIN B-12) 3000 MCG SUBL Place 3,000 mcg under the tongue daily in the afternoon.     dexamethasone  (DECADRON ) 4 MG tablet Take 5 tablets (20 mg) by mouth with breakfast the day after every daratumumab  dose 30 tablet 1   fexofenadine  (ALLEGRA) 180 MG tablet Take 180 mg by mouth daily.     fexofenadine-pseudoephedrine (ALLEGRA-D 24) 180-240 MG 24 hr tablet Take 1 tablet by mouth daily.     glucose blood (ONETOUCH ULTRA TEST) test strip Use as instructed 200 each 3   [Paused] hydrALAZINE  (APRESOLINE ) 25 MG tablet Take 25 mg by mouth See admin instructions. Take 25 mg by mouth at 8 AM, 3 PM, and 11 PM- in conjunction with one 50 mg tablet to equal a total dose of 75 mg     insulin  aspart (NOVOLOG  FLEXPEN) 100 UNIT/ML FlexPen Per sliding scale max 40 u daily (Patient taking differently: 2-8 Units See admin instructions. Inject 2-8 units into the skin three times a day with meals, PER SLIDING SCALE) 15 mL 11   Insulin  Pen Needle (B-D UF III MINI PEN NEEDLES) 31G X 5 MM MISC AS DIRECTED 100 each 2   Lancets (ONETOUCH DELICA PLUS LANCET33G) MISC USE 1 LANCET TO TEST AS DIRECTED (DISCARD LANCET IN SHARPS CONTAINER IMMEDIATELY AFTER USE) 200 each 1   levothyroxine  (SYNTHROID ) 25 MCG tablet TAKE ONE TABLET BY MOUTH EVERY DAY BEFORE BREAKFAST 90 tablet 1   Multiple Vitamins-Minerals (PRESERVISION AREDS 2) CAPS Take 1 capsule by mouth 2 (two) times daily with a meal.     ondansetron  (ZOFRAN ) 8 MG tablet Take 8 mg by mouth 30 to 60 min prior to Cyclophosphamide administration then take 8 mg every 8 hrs as needed for nausea and vomiting. 30 tablet 1   pantoprazole  (PROTONIX ) 40 MG tablet Pantoprazole  40 mg before breakfast and dinner for 90 days with 3 refills (Patient taking differently: Take 40 mg by mouth See admin instructions. Take 40 mg by mouth 30 minutes before lunch and supper) 180 tablet 3   prochlorperazine  (COMPAZINE ) 10 MG tablet Take 1 tablet (10 mg total) by mouth every 6 (six) hours as needed for nausea or vomiting. 30 tablet 1   rosuvastatin  (CRESTOR ) 20 MG tablet TAKE ONE TABLET BY MOUTH EVERY DAY 90 tablet 3   RYBELSUS  7 MG TABS TAKE ONE TABLET BY MOUTH EVERY DAY - (TAKE WITH NO MORE THAN 4 OUNCES OF PLAIN WATER AT LEAST 30  MINUTES BEFORE THE FIRST FOOD, BEVERAGE, OR OTHER MEDICATIONS OF THE DAY) 90 tablet 0   SYSTANE ULTRA PF 0.4-0.3 % SOLN Place 1 drop into both eyes See  admin instructions. Instill 1 drop into both eyes one to four times a day     No current facility-administered medications for this visit.    REVIEW OF SYSTEMS:    10 Point review of Systems was done is negative except as noted above.  PHYSICAL EXAMINATION:  ECOG PERFORMANCE STATUS: 2 - Symptomatic, <50% confined to bed  Vitals:   04/19/24 1022  BP: (!) 131/59  Pulse: 73  Resp: 16  Temp: (!) 97.5 F (36.4 C)  SpO2: 99%     Filed Weights   04/19/24 1022  Weight: 116 lb 14.4 oz (53 kg)     .Body mass index is 22.09 kg/m.  GENERAL:alert, in no acute distress and comfortable SKIN: no acute rashes, no significant lesions EYES: conjunctiva are pink and non-injected, sclera anicteric OROPHARYNX: MMM, no exudates, no oropharyngeal erythema or ulceration NECK: supple, no JVD LYMPH:  no palpable lymphadenopathy in the cervical, axillary or inguinal regions. LUNGS: clear to auscultation b/l with normal respiratory effort HEART: regular rate & rhythm ABDOMEN:  normoactive bowel sounds , non tender, not distended. Extremity: no pedal edema PSYCH: alert & oriented x 3 with fluent speech NEURO: no focal motor/sensory deficits  LABORATORY DATA:  I have reviewed the data as listed  .    Latest Ref Rng & Units 04/19/2024    9:49 AM 04/12/2024    7:48 AM 04/06/2024    8:19 AM  CBC  WBC 4.0 - 10.5 K/uL 8.3  8.2  6.9   Hemoglobin 12.0 - 15.0 g/dL 16.1  9.6  9.6   Hematocrit 36.0 - 46.0 % 29.5  28.0  28.4   Platelets 150 - 400 K/uL 270  255  210        Latest Ref Rng & Units 04/19/2024    9:54 AM 04/12/2024    7:48 AM 04/06/2024    8:19 AM  CMP  Glucose 70 - 99 mg/dL 096  045  409   BUN 8 - 23 mg/dL 68  76  69   Creatinine 0.44 - 1.00 mg/dL 8.11  9.14  7.82   Sodium 135 - 145 mmol/L 137  134  135   Potassium 3.5 - 5.1  mmol/L 3.9  3.9  4.0   Chloride 98 - 111 mmol/L 106  105  106   CO2 22 - 32 mmol/L 26  24  25    Calcium  8.9 - 10.3 mg/dL 8.4  8.1  8.3   Total Protein 6.5 - 8.1 g/dL 6.0  6.0  6.7   Total Bilirubin 0.0 - 1.2 mg/dL 0.2  0.2  0.2   Alkaline Phos 38 - 126 U/L 49  55  56   AST 15 - 41 U/L 29  23  23    ALT 0 - 44 U/L 44  40  36    Bone marrow biopsy 02/01/2024:    Surgical pathology 01/29/2024:    RADIOGRAPHIC STUDIES: I have personally reviewed the radiological images as listed and agreed with the findings in the report. No results found.   ASSESSMENT & PLAN:  78 y.o. female with:  Newly diagnosed Multiple myeloma - Lambda restricted plasma cell neoplasm comprising greater than 75% of  the cellular marrow 2. Acute on CKD 3. HTN 4. DM2 5. Hypothyroidism  Patient Active Problem List   Diagnosis Date Noted   Symptomatic anemia 01/29/2024   Multiple myeloma (HCC) 01/29/2024   Acute renal failure superimposed on stage 3b chronic kidney disease (HCC) 01/29/2024   Anemia  11/26/2023   Hypothyroidism 02/23/2023   Type 2 diabetes mellitus with hyperglycemia, without long-term current use of insulin  (HCC) 07/26/2021   Impacted cerumen of right ear 07/06/2020   Type 2 diabetes mellitus with hyperglycemia (HCC) 04/11/2019   Hyperlipidemia LDL goal <70 03/23/2017   Abdominal pain, epigastric 09/03/2016   Strain of right biceps 03/17/2016   Right shoulder pain 03/17/2016   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   Plantar wart of right foot 03/15/2015   Uncontrolled type 2 diabetes mellitus with hyperglycemia (HCC) 09/16/2013   Elevated lipids 09/05/2013   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 Hyponatremia (from 02/23/2023) chronic. Recent thyroid  function tests WNL. 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  PLAN:  -Discussed lab results from today, 04/19/2024, in detail with the patient. CBC shows low Hgb of 10.0 g/dL with Hct of 98.1%, hgb and hct improved. CMP stable creatinine 1.12  -Multiple myeloma panel results from 04/12/2024 showed elevated but improved M-protein level of 1.1, from 6.5.  -Kappa/Lambda light chains results from 04/12/2024 showed low Kappa/Lambda light chain ratio of 0.12.  -Patient has been tolerating her treatment well without any new or severe toxicities.  -Patient can proceed with cycle 2 day 22 of her treatment without any dose modification.  -Discussed with the patient that Daratumumab  infusion will be every 2 weeks starting cycle 3.  -Answered all of patient's questions.  -We will cut down dexamethasone  dose to 3 tablets. So, post-Dara steroid will be 12 mg, instead of 16 mg.    FOLLOW-UP: Per integrated scheduling MD visit in 4 weeks  The total time spent in the appointment was 32 minutes* .  All of the patient's questions were answered with apparent satisfaction. The patient knows to call the clinic with any problems, questions or concerns.   Jacquelyn Matt MD MS AAHIVMS [facility] Hematology/Oncology Physician [facility]  .*Total Encounter Time as defined by the Centers for Medicare and Medicaid Services includes, in addition to the face-to-face time of a patient visit (documented in the note above) non-face-to-face time: obtaining and reviewing outside history, ordering and reviewing medications, tests or procedures, care coordination (communications with other health care professionals or caregivers) and documentation in the medical record.   I,Param Shah,acting as a Neurosurgeon for Jacquelyn Matt, MD.,have documented all relevant documentation on the behalf of Jacquelyn Matt, MD,as directed by  Jacquelyn Matt, MD while in the presence of Jacquelyn Matt, MD.   .I have reviewed the  above documentation for accuracy and completeness, and I agree with the above. .Deane Wattenbarger Kishore Jhoanna Heyde MD

## 2024-04-19 NOTE — Patient Instructions (Signed)
 [facility]  Discharge Instructions: Thank you for choosing [facility] to provide your oncology and hematology care.   If you have a lab appointment with the Cancer Center, please go directly to the Cancer Center and check in at the registration area.   Wear comfortable clothing and clothing appropriate for easy access to any Portacath or PICC line.   We strive to give you quality time with your provider. You may need to reschedule your appointment if you arrive late (15 or more minutes).  Arriving late affects you and other patients whose appointments are after yours.  Also, if you miss three or more appointments without notifying the office, you may be dismissed from the clinic at the provider's discretion.      For prescription refill requests, have your pharmacy contact our office and allow 72 hours for refills to be completed.    Today you received the following chemotherapy and/or immunotherapy agents: Velcade/Darzalex Faspro      To help prevent nausea and vomiting after your treatment, we encourage you to take your nausea medication as directed.  BELOW ARE SYMPTOMS THAT SHOULD BE REPORTED IMMEDIATELY: *FEVER GREATER THAN 100.4 F (38 C) OR HIGHER *CHILLS OR SWEATING *NAUSEA AND VOMITING THAT IS NOT CONTROLLED WITH YOUR NAUSEA MEDICATION *UNUSUAL SHORTNESS OF BREATH *UNUSUAL BRUISING OR BLEEDING *URINARY PROBLEMS (pain or burning when urinating, or frequent urination) *BOWEL PROBLEMS (unusual diarrhea, constipation, pain near the anus) TENDERNESS IN MOUTH AND THROAT WITH OR WITHOUT PRESENCE OF ULCERS (sore throat, sores in mouth, or a toothache) UNUSUAL RASH, SWELLING OR PAIN  UNUSUAL VAGINAL DISCHARGE OR ITCHING   Items with * indicate a potential emergency and should be followed up as soon as possible or go to the Emergency Department if any problems should occur.  Please show the CHEMOTHERAPY ALERT CARD or  IMMUNOTHERAPY ALERT CARD at check-in to the Emergency Department and triage nurse.  Should you have questions after your visit or need to cancel or reschedule your appointment, please contact [facility] - A DEPT OF [facility]  Dept: [phone]  and follow the prompts.  Office hours are 8:00 a.m. to 4:30 p.m. Monday - Friday. Please note that voicemails left after 4:00 p.m. may not be returned until the following business day.  We are closed weekends and major holidays. You have access to a nurse at all times for urgent questions. Please call the main number to the clinic Dept: [phone] and follow the prompts.   For any non-urgent questions, you may also contact your provider using MyChart. We now offer e-Visits for anyone 25 and older to request care online for non-urgent symptoms. For details visit [email or web address].   Also download the MyChart app! Go to the app store, search "MyChart", open the app, select [place], and log in with your MyChart username and password.

## 2024-04-25 ENCOUNTER — Encounter: Payer: Self-pay | Admitting: Hematology

## 2024-04-26 ENCOUNTER — Inpatient Hospital Stay

## 2024-04-26 ENCOUNTER — Inpatient Hospital Stay: Attending: Hematology

## 2024-04-26 VITALS — BP 125/54 | HR 75 | Temp 98.1°F | Resp 16 | Wt 118.5 lb

## 2024-04-26 DIAGNOSIS — Z5112 Encounter for antineoplastic immunotherapy: Secondary | ICD-10-CM | POA: Insufficient documentation

## 2024-04-26 DIAGNOSIS — C9000 Multiple myeloma not having achieved remission: Secondary | ICD-10-CM

## 2024-04-26 DIAGNOSIS — Z79899 Other long term (current) drug therapy: Secondary | ICD-10-CM | POA: Diagnosis not present

## 2024-04-26 DIAGNOSIS — Z79620 Long term (current) use of immunosuppressive biologic: Secondary | ICD-10-CM | POA: Insufficient documentation

## 2024-04-26 LAB — CBC WITH DIFFERENTIAL (CANCER CENTER ONLY)
Abs Immature Granulocytes: 0.01 10*3/uL (ref 0.00–0.07)
Basophils Absolute: 0 10*3/uL (ref 0.0–0.1)
Basophils Relative: 1 %
Eosinophils Absolute: 0.1 10*3/uL (ref 0.0–0.5)
Eosinophils Relative: 2 %
HCT: 29.9 % — ABNORMAL LOW (ref 36.0–46.0)
Hemoglobin: 9.9 g/dL — ABNORMAL LOW (ref 12.0–15.0)
Immature Granulocytes: 0 %
Lymphocytes Relative: 19 %
Lymphs Abs: 1.2 10*3/uL (ref 0.7–4.0)
MCH: 31.8 pg (ref 26.0–34.0)
MCHC: 33.1 g/dL (ref 30.0–36.0)
MCV: 96.1 fL (ref 80.0–100.0)
Monocytes Absolute: 0.5 10*3/uL (ref 0.1–1.0)
Monocytes Relative: 8 %
Neutro Abs: 4.2 10*3/uL (ref 1.7–7.7)
Neutrophils Relative %: 70 %
Platelet Count: 216 10*3/uL (ref 150–400)
RBC: 3.11 MIL/uL — ABNORMAL LOW (ref 3.87–5.11)
RDW: 15.8 % — ABNORMAL HIGH (ref 11.5–15.5)
WBC Count: 6 10*3/uL (ref 4.0–10.5)
nRBC: 0 % (ref 0.0–0.2)

## 2024-04-26 LAB — CMP (CANCER CENTER ONLY)
ALT: 101 U/L — ABNORMAL HIGH (ref 0–44)
AST: 67 U/L — ABNORMAL HIGH (ref 15–41)
Albumin: 3.2 g/dL — ABNORMAL LOW (ref 3.5–5.0)
Alkaline Phosphatase: 50 U/L (ref 38–126)
Anion gap: 4 — ABNORMAL LOW (ref 5–15)
BUN: 40 mg/dL — ABNORMAL HIGH (ref 8–23)
CO2: 27 mmol/L (ref 22–32)
Calcium: 8.4 mg/dL — ABNORMAL LOW (ref 8.9–10.3)
Chloride: 105 mmol/L (ref 98–111)
Creatinine: 1.1 mg/dL — ABNORMAL HIGH (ref 0.44–1.00)
GFR, Estimated: 52 mL/min — ABNORMAL LOW (ref >60)
Glucose, Bld: 147 mg/dL — ABNORMAL HIGH (ref 70–99)
Potassium: 4.1 mmol/L (ref 3.5–5.1)
Sodium: 136 mmol/L (ref 135–145)
Total Bilirubin: 0.2 mg/dL (ref 0.0–1.2)
Total Protein: 5.6 g/dL — ABNORMAL LOW (ref 6.5–8.1)

## 2024-04-26 MED ORDER — ACETAMINOPHEN 325 MG PO TABS
650.0000 mg | ORAL_TABLET | Freq: Once | ORAL | Status: AC
Start: 2024-04-26 — End: 2024-04-26
  Administered 2024-04-26: 650 mg via ORAL
  Filled 2024-04-26: qty 2

## 2024-04-26 MED ORDER — DEXAMETHASONE 4 MG PO TABS
20.0000 mg | ORAL_TABLET | Freq: Once | ORAL | Status: AC
  Administered 2024-04-26: 20 mg via ORAL
  Filled 2024-04-26: qty 5

## 2024-04-26 MED ORDER — DARATUMUMAB-HYALURONIDASE-FIHJ 1800-30000 MG-UT/15ML ~~LOC~~ SOLN
1800.0000 mg | Freq: Once | SUBCUTANEOUS | Status: AC
  Administered 2024-04-26: 1800 mg via SUBCUTANEOUS
  Filled 2024-04-26: qty 15

## 2024-04-26 MED ORDER — DIPHENHYDRAMINE HCL 25 MG PO CAPS
25.0000 mg | ORAL_CAPSULE | Freq: Once | ORAL | Status: AC
  Administered 2024-04-26: 25 mg via ORAL
  Filled 2024-04-26: qty 1

## 2024-04-26 MED ORDER — BORTEZOMIB CHEMO SQ INJECTION 3.5 MG (2.5MG/ML)
1.5000 mg/m2 | Freq: Once | INTRAMUSCULAR | Status: AC
  Administered 2024-04-26: 2.25 mg via SUBCUTANEOUS
  Filled 2024-04-26: qty 0.9

## 2024-04-26 NOTE — Progress Notes (Signed)
 Elevated ALT today of 101 U/L, previous ALT 44 U/L on 04/19/24. Pt denied changes in medications/supplements, taking Tylenol  and alcohol consumption. This RN made Wyline Hearing PA-C and Dr. Salomon Cree aware. Per Dr. Salomon Cree OK to proceed with Tx today.

## 2024-04-26 NOTE — Patient Instructions (Signed)
 [facility]  Discharge Instructions: Thank you for choosing [facility] to provide your oncology and hematology care.   If you have a lab appointment with the Cancer Center, please go directly to the Cancer Center and check in at the registration area.   Wear comfortable clothing and clothing appropriate for easy access to any Portacath or PICC line.   We strive to give you quality time with your provider. You may need to reschedule your appointment if you arrive late (15 or more minutes).  Arriving late affects you and other patients whose appointments are after yours.  Also, if you miss three or more appointments without notifying the office, you may be dismissed from the clinic at the provider's discretion.      For prescription refill requests, have your pharmacy contact our office and allow 72 hours for refills to be completed.    Today you received the following chemotherapy and/or immunotherapy agents: Velcade/Darzalex Faspro      To help prevent nausea and vomiting after your treatment, we encourage you to take your nausea medication as directed.  BELOW ARE SYMPTOMS THAT SHOULD BE REPORTED IMMEDIATELY: *FEVER GREATER THAN 100.4 F (38 C) OR HIGHER *CHILLS OR SWEATING *NAUSEA AND VOMITING THAT IS NOT CONTROLLED WITH YOUR NAUSEA MEDICATION *UNUSUAL SHORTNESS OF BREATH *UNUSUAL BRUISING OR BLEEDING *URINARY PROBLEMS (pain or burning when urinating, or frequent urination) *BOWEL PROBLEMS (unusual diarrhea, constipation, pain near the anus) TENDERNESS IN MOUTH AND THROAT WITH OR WITHOUT PRESENCE OF ULCERS (sore throat, sores in mouth, or a toothache) UNUSUAL RASH, SWELLING OR PAIN  UNUSUAL VAGINAL DISCHARGE OR ITCHING   Items with * indicate a potential emergency and should be followed up as soon as possible or go to the Emergency Department if any problems should occur.  Please show the CHEMOTHERAPY ALERT CARD or  IMMUNOTHERAPY ALERT CARD at check-in to the Emergency Department and triage nurse.  Should you have questions after your visit or need to cancel or reschedule your appointment, please contact [facility] - A DEPT OF [facility]  Dept: [phone]  and follow the prompts.  Office hours are 8:00 a.m. to 4:30 p.m. Monday - Friday. Please note that voicemails left after 4:00 p.m. may not be returned until the following business day.  We are closed weekends and major holidays. You have access to a nurse at all times for urgent questions. Please call the main number to the clinic Dept: [phone] and follow the prompts.   For any non-urgent questions, you may also contact your provider using MyChart. We now offer e-Visits for anyone 25 and older to request care online for non-urgent symptoms. For details visit [email or web address].   Also download the MyChart app! Go to the app store, search "MyChart", open the app, select [place], and log in with your MyChart username and password.

## 2024-04-27 LAB — KAPPA/LAMBDA LIGHT CHAINS
Kappa free light chain: 2.6 mg/L — ABNORMAL LOW (ref 3.3–19.4)
Kappa, lambda light chain ratio: 0.18 — ABNORMAL LOW (ref 0.26–1.65)
Lambda free light chains: 14.7 mg/L (ref 5.7–26.3)

## 2024-04-29 LAB — MULTIPLE MYELOMA PANEL, SERUM
Albumin SerPl Elph-Mcnc: 2.7 g/dL — ABNORMAL LOW (ref 2.9–4.4)
Albumin/Glob SerPl: 1.1 (ref 0.7–1.7)
Alpha 1: 0.2 g/dL (ref 0.0–0.4)
Alpha2 Glob SerPl Elph-Mcnc: 0.7 g/dL (ref 0.4–1.0)
B-Globulin SerPl Elph-Mcnc: 0.7 g/dL (ref 0.7–1.3)
Gamma Glob SerPl Elph-Mcnc: 0.8 g/dL (ref 0.4–1.8)
Globulin, Total: 2.5 g/dL (ref 2.2–3.9)
IgA: <5 mg/dL — ABNORMAL LOW (ref 64–422)
IgG (Immunoglobin G), Serum: 996 mg/dL (ref 586–1602)
IgM (Immunoglobulin M), Srm: 11 mg/dL — ABNORMAL LOW (ref 26–217)
M Protein SerPl Elph-Mcnc: 0.7 g/dL — ABNORMAL HIGH
Total Protein ELP: 5.2 g/dL — ABNORMAL LOW (ref 6.0–8.5)

## 2024-05-04 ENCOUNTER — Other Ambulatory Visit

## 2024-05-04 ENCOUNTER — Inpatient Hospital Stay

## 2024-05-04 ENCOUNTER — Inpatient Hospital Stay: Admitting: Dietician

## 2024-05-04 ENCOUNTER — Other Ambulatory Visit: Payer: Self-pay | Admitting: Family Medicine

## 2024-05-04 ENCOUNTER — Other Ambulatory Visit: Payer: Self-pay | Admitting: *Deleted

## 2024-05-04 ENCOUNTER — Ambulatory Visit: Admitting: Hematology

## 2024-05-04 VITALS — BP 137/58 | HR 81 | Temp 98.2°F | Resp 16

## 2024-05-04 DIAGNOSIS — C9000 Multiple myeloma not having achieved remission: Secondary | ICD-10-CM

## 2024-05-04 DIAGNOSIS — Z79899 Other long term (current) drug therapy: Secondary | ICD-10-CM | POA: Diagnosis not present

## 2024-05-04 DIAGNOSIS — Z79620 Long term (current) use of immunosuppressive biologic: Secondary | ICD-10-CM | POA: Diagnosis not present

## 2024-05-04 DIAGNOSIS — E1165 Type 2 diabetes mellitus with hyperglycemia: Secondary | ICD-10-CM

## 2024-05-04 DIAGNOSIS — Z5112 Encounter for antineoplastic immunotherapy: Secondary | ICD-10-CM | POA: Diagnosis not present

## 2024-05-04 LAB — CMP (CANCER CENTER ONLY)
ALT: 74 U/L — ABNORMAL HIGH (ref 0–44)
AST: 45 U/L — ABNORMAL HIGH (ref 15–41)
Albumin: 3.3 g/dL — ABNORMAL LOW (ref 3.5–5.0)
Alkaline Phosphatase: 46 U/L (ref 38–126)
Anion gap: 4 — ABNORMAL LOW (ref 5–15)
BUN: 37 mg/dL — ABNORMAL HIGH (ref 8–23)
CO2: 28 mmol/L (ref 22–32)
Calcium: 8.5 mg/dL — ABNORMAL LOW (ref 8.9–10.3)
Chloride: 106 mmol/L (ref 98–111)
Creatinine: 1.1 mg/dL — ABNORMAL HIGH (ref 0.44–1.00)
GFR, Estimated: 52 mL/min — ABNORMAL LOW (ref >60)
Glucose, Bld: 166 mg/dL — ABNORMAL HIGH (ref 70–99)
Potassium: 4.1 mmol/L (ref 3.5–5.1)
Sodium: 138 mmol/L (ref 135–145)
Total Bilirubin: 0.2 mg/dL (ref 0.0–1.2)
Total Protein: 5.8 g/dL — ABNORMAL LOW (ref 6.5–8.1)

## 2024-05-04 LAB — CBC WITH DIFFERENTIAL (CANCER CENTER ONLY)
Abs Immature Granulocytes: 0.02 10*3/uL (ref 0.00–0.07)
Basophils Absolute: 0 10*3/uL (ref 0.0–0.1)
Basophils Relative: 0 %
Eosinophils Absolute: 0.1 10*3/uL (ref 0.0–0.5)
Eosinophils Relative: 1 %
HCT: 30.5 % — ABNORMAL LOW (ref 36.0–46.0)
Hemoglobin: 10.1 g/dL — ABNORMAL LOW (ref 12.0–15.0)
Immature Granulocytes: 0 %
Lymphocytes Relative: 20 %
Lymphs Abs: 1.3 10*3/uL (ref 0.7–4.0)
MCH: 32.1 pg (ref 26.0–34.0)
MCHC: 33.1 g/dL (ref 30.0–36.0)
MCV: 96.8 fL (ref 80.0–100.0)
Monocytes Absolute: 0.5 10*3/uL (ref 0.1–1.0)
Monocytes Relative: 8 %
Neutro Abs: 4.7 10*3/uL (ref 1.7–7.7)
Neutrophils Relative %: 71 %
Platelet Count: 197 10*3/uL (ref 150–400)
RBC: 3.15 MIL/uL — ABNORMAL LOW (ref 3.87–5.11)
RDW: 15.4 % (ref 11.5–15.5)
WBC Count: 6.6 10*3/uL (ref 4.0–10.5)
nRBC: 0 % (ref 0.0–0.2)

## 2024-05-04 MED ORDER — ONDANSETRON HCL 8 MG PO TABS: ORAL_TABLET | ORAL | 1 refills | Status: AC

## 2024-05-04 MED ORDER — DEXAMETHASONE 4 MG PO TABS
40.0000 mg | ORAL_TABLET | Freq: Once | ORAL | Status: AC
  Administered 2024-05-04: 40 mg via ORAL
  Filled 2024-05-04: qty 10

## 2024-05-04 MED ORDER — PROCHLORPERAZINE MALEATE 10 MG PO TABS: 10.0000 mg | ORAL_TABLET | Freq: Four times a day (QID) | ORAL | 1 refills | Status: AC | PRN

## 2024-05-04 MED ORDER — DEXAMETHASONE 4 MG PO TABS: ORAL_TABLET | ORAL | 1 refills | Status: DC

## 2024-05-04 MED ORDER — BORTEZOMIB CHEMO SQ INJECTION 3.5 MG (2.5MG/ML)
1.5000 mg/m2 | Freq: Once | INTRAMUSCULAR | Status: AC
  Administered 2024-05-04: 2.25 mg via SUBCUTANEOUS
  Filled 2024-05-04: qty 0.9

## 2024-05-04 MED ORDER — ACYCLOVIR 400 MG PO TABS: 400.0000 mg | ORAL_TABLET | Freq: Two times a day (BID) | ORAL | 5 refills | Status: DC

## 2024-05-04 NOTE — Telephone Encounter (Signed)
 Copied from CRM [id number]. Topic: Clinical - Medication Refill >> May 04, 2024  4:12 PM Caliyah H wrote: Medication: (NOVOLOG  FLEXPEN) 100 UNIT/ML FlexPen Insulin  Pen Needle (B-D UF III MINI PEN NEEDLES) 31G X 5 MM MISC Pantoprazole    Has the patient contacted their pharmacy? No Patient's husband called to report they are switching pharmacies due to medication cost. (Agent: If no, request that the patient contact the pharmacy for the refill. If patient does not wish to contact the pharmacy document the reason why and proceed with request.) (Agent: If yes, when and what did the pharmacy advise?)  This is the patient's preferred pharmacy:  [facility] - [place], [place] - [address] [facility] [address] [place] [address] [place] [address] Phone: [phone] Fax: [phone]  Is this the correct pharmacy for this prescription? Yes If no, delete pharmacy and type the correct one.   Has the prescription been filled recently? Yes -NOVOLOG  FLEXPEN 02/05/24 - Insulin  Pen Needle 03/31/24 - Protonix  01/29/24  Is the patient out of the medication? No 50 needles left. 1/2 insulin  left.  Has the patient been seen for an appointment in the last year OR does the patient have an upcoming appointment? Yes telemed visit 03/21/24  Can we respond through MyChart? No  Agent: Please be advised that Rx refills may take up to 3 business days. We ask that you follow-up with your pharmacy.

## 2024-05-04 NOTE — Telephone Encounter (Signed)
 Patient requested following meds be sent to [facility] mail order pharmacy for refill as they can be filled at no cost to her. Compazine , Zofran , Acyclovir  and Decadron   Med refills sent to [facility]

## 2024-05-04 NOTE — Progress Notes (Signed)
 Nutrition Follow-up:  Pt with myeloma. She is receiving DaraCyBorD q28d. Patient is under the care of Dr. Salomon Cree   Met with patient and husband in infusion. Patient reports good appetite. She is eating 3 meals + snacks. Patient is supplementing intake with 3-4 Ensure Max. Yesterday, patient had bacon/egg/mayo sandwich for breakfast, meatloaf, mashed potatoes, green beans, biscuit with honey for lunch. Patient snacked on poppycock before dinner which was a meatloaf sandwich. She had a bowl of raison brand before bed. Patient consumed 3 Ensure Max and 6 bottles of water. She denies nausea, vomiting, diarrhea, constipation.    Medications: reviewed   Labs: glucose 166, BUN 37, Cr 1.10, albumin 3.3  Anthropometrics: Wt 118 lb 8 oz on 5/6 - increased   4/29 - 116 lb 4.4 oz  4/01 - 108 lb 9.6 oz    NUTRITION DIAGNOSIS: Food and nutrition related knowledge deficit improved   INTERVENTION:  Continue strategies for increasing calories and protein with small frequent meals/snacks Continue Ensure Max 2-3/day     MONITORING, EVALUATION, GOAL: wt trends, intake   NEXT VISIT: To be scheduled as needed

## 2024-05-04 NOTE — Telephone Encounter (Unsigned)
 Copied from CRM [id number]. Topic: Clinical - Medication Refill >> May 04, 2024  4:12 PM Cynthia Burns wrote: Medication: (NOVOLOG  FLEXPEN) 100 UNIT/ML FlexPen Insulin  Pen Needle (B-D UF III MINI PEN NEEDLES) 31G X 5 MM MISC Pantoprazole    Has the patient contacted their pharmacy? No Patient's husband called to report they are switching pharmacies due to medication cost. (Agent: If no, request that the patient contact the pharmacy for the refill. If patient does not wish to contact the pharmacy document the reason why and proceed with request.) (Agent: If yes, when and what did the pharmacy advise?)  This is the patient's preferred pharmacy:  [facility] - [place], [place] - [address] [facility] [address] [place] [address] [place] [address] Phone: [phone] Fax: [phone]  Is this the correct pharmacy for this prescription? Yes If no, delete pharmacy and type the correct one.   Has the prescription been filled recently? Yes -NOVOLOG  FLEXPEN 02/05/24 - Insulin  Pen Needle 03/31/24 - Protonix  01/29/24  Is the patient out of the medication? No 50 needles left. 1/2 insulin  left.  Has the patient been seen for an appointment in the last year OR does the patient have an upcoming appointment? Yes telemed visit 03/21/24  Can we respond through MyChart? No  Agent: Please be advised that Rx refills may take up to 3 business days. We ask that you follow-up with your pharmacy.

## 2024-05-04 NOTE — Patient Instructions (Signed)
 [facility] - A DEPT OF [facility]  Discharge Instructions: Thank you for choosing [facility] to provide your oncology and hematology care.   If you have a lab appointment with the Cancer Center, please go directly to the Cancer Center and check in at the registration area.   Wear comfortable clothing and clothing appropriate for easy access to any Portacath or PICC line.   We strive to give you quality time with your provider. You may need to reschedule your appointment if you arrive late (15 or more minutes).  Arriving late affects you and other patients whose appointments are after yours.  Also, if you miss three or more appointments without notifying the office, you may be dismissed from the clinic at the provider's discretion.      For prescription refill requests, have your pharmacy contact our office and allow 72 hours for refills to be completed.    Today you received the following chemotherapy and/or immunotherapy agents: Velcade    To help prevent nausea and vomiting after your treatment, we encourage you to take your nausea medication as directed.  BELOW ARE SYMPTOMS THAT SHOULD BE REPORTED IMMEDIATELY: *FEVER GREATER THAN 100.4 F (38 C) OR HIGHER *CHILLS OR SWEATING *NAUSEA AND VOMITING THAT IS NOT CONTROLLED WITH YOUR NAUSEA MEDICATION *UNUSUAL SHORTNESS OF BREATH *UNUSUAL BRUISING OR BLEEDING *URINARY PROBLEMS (pain or burning when urinating, or frequent urination) *BOWEL PROBLEMS (unusual diarrhea, constipation, pain near the anus) TENDERNESS IN MOUTH AND THROAT WITH OR WITHOUT PRESENCE OF ULCERS (sore throat, sores in mouth, or a toothache) UNUSUAL RASH, SWELLING OR PAIN  UNUSUAL VAGINAL DISCHARGE OR ITCHING   Items with * indicate a potential emergency and should be followed up as soon as possible or go to the Emergency Department if any problems should occur.  Please show the CHEMOTHERAPY ALERT CARD or IMMUNOTHERAPY  ALERT CARD at check-in to the Emergency Department and triage nurse.  Should you have questions after your visit or need to cancel or reschedule your appointment, please contact [facility] - A DEPT OF [facility]  Dept: [phone]  and follow the prompts.  Office hours are 8:00 a.m. to 4:30 p.m. Monday - Friday. Please note that voicemails left after 4:00 p.m. may not be returned until the following business day.  We are closed weekends and major holidays. You have access to a nurse at all times for urgent questions. Please call the main number to the clinic Dept: [phone] and follow the prompts.   For any non-urgent questions, you may also contact your provider using MyChart. We now offer e-Visits for anyone 69 and older to request care online for non-urgent symptoms. For details visit [email or web address].   Also download the MyChart app! Go to the app store, search "MyChart", open the app, select , and log in with your MyChart username and password.

## 2024-05-05 MED ORDER — NOVOLOG FLEXPEN 100 UNIT/ML ~~LOC~~ SOPN: PEN_INJECTOR | SUBCUTANEOUS | 3 refills | Status: DC

## 2024-05-05 MED ORDER — BD PEN NEEDLE MINI U/F 31G X 5 MM MISC
2 refills | Status: DC
Start: 2024-05-05 — End: 2024-05-13

## 2024-05-10 ENCOUNTER — Other Ambulatory Visit

## 2024-05-10 ENCOUNTER — Other Ambulatory Visit: Payer: Self-pay | Admitting: Hematology

## 2024-05-10 ENCOUNTER — Ambulatory Visit: Admitting: Hematology

## 2024-05-10 ENCOUNTER — Inpatient Hospital Stay

## 2024-05-10 ENCOUNTER — Encounter: Payer: Self-pay | Admitting: Hematology

## 2024-05-10 VITALS — BP 140/62 | HR 66 | Temp 97.7°F | Resp 16 | Wt 118.5 lb

## 2024-05-10 DIAGNOSIS — Z79620 Long term (current) use of immunosuppressive biologic: Secondary | ICD-10-CM | POA: Diagnosis not present

## 2024-05-10 DIAGNOSIS — C9000 Multiple myeloma not having achieved remission: Secondary | ICD-10-CM

## 2024-05-10 DIAGNOSIS — Z79899 Other long term (current) drug therapy: Secondary | ICD-10-CM | POA: Diagnosis not present

## 2024-05-10 DIAGNOSIS — Z5112 Encounter for antineoplastic immunotherapy: Secondary | ICD-10-CM | POA: Diagnosis not present

## 2024-05-10 LAB — CMP (CANCER CENTER ONLY)
ALT: 107 U/L — ABNORMAL HIGH (ref 0–44)
AST: 40 U/L (ref 15–41)
Albumin: 3.5 g/dL (ref 3.5–5.0)
Alkaline Phosphatase: 49 U/L (ref 38–126)
Anion gap: 5 (ref 5–15)
BUN: 51 mg/dL — ABNORMAL HIGH (ref 8–23)
CO2: 28 mmol/L (ref 22–32)
Calcium: 8.7 mg/dL — ABNORMAL LOW (ref 8.9–10.3)
Chloride: 105 mmol/L (ref 98–111)
Creatinine: 1.16 mg/dL — ABNORMAL HIGH (ref 0.44–1.00)
GFR, Estimated: 49 mL/min — ABNORMAL LOW (ref >60)
Glucose, Bld: 167 mg/dL — ABNORMAL HIGH (ref 70–99)
Potassium: 4.1 mmol/L (ref 3.5–5.1)
Sodium: 138 mmol/L (ref 135–145)
Total Bilirubin: 0.3 mg/dL (ref 0.0–1.2)
Total Protein: 5.9 g/dL — ABNORMAL LOW (ref 6.5–8.1)

## 2024-05-10 LAB — CBC WITH DIFFERENTIAL (CANCER CENTER ONLY)
Abs Immature Granulocytes: 0.02 10*3/uL (ref 0.00–0.07)
Basophils Absolute: 0 10*3/uL (ref 0.0–0.1)
Basophils Relative: 0 %
Eosinophils Absolute: 0.1 10*3/uL (ref 0.0–0.5)
Eosinophils Relative: 1 %
HCT: 30.7 % — ABNORMAL LOW (ref 36.0–46.0)
Hemoglobin: 10.2 g/dL — ABNORMAL LOW (ref 12.0–15.0)
Immature Granulocytes: 0 %
Lymphocytes Relative: 22 %
Lymphs Abs: 1.6 10*3/uL (ref 0.7–4.0)
MCH: 31.7 pg (ref 26.0–34.0)
MCHC: 33.2 g/dL (ref 30.0–36.0)
MCV: 95.3 fL (ref 80.0–100.0)
Monocytes Absolute: 0.5 10*3/uL (ref 0.1–1.0)
Monocytes Relative: 7 %
Neutro Abs: 5 10*3/uL (ref 1.7–7.7)
Neutrophils Relative %: 70 %
Platelet Count: 238 10*3/uL (ref 150–400)
RBC: 3.22 MIL/uL — ABNORMAL LOW (ref 3.87–5.11)
RDW: 15.3 % (ref 11.5–15.5)
WBC Count: 7.2 10*3/uL (ref 4.0–10.5)
nRBC: 0 % (ref 0.0–0.2)

## 2024-05-10 MED ORDER — DEXAMETHASONE 4 MG PO TABS
20.0000 mg | ORAL_TABLET | Freq: Once | ORAL | Status: AC
Start: 2024-05-10 — End: 2024-05-10
  Administered 2024-05-10: 20 mg via ORAL
  Filled 2024-05-10: qty 5

## 2024-05-10 MED ORDER — FUROSEMIDE 20 MG PO TABS: 20.0000 mg | ORAL_TABLET | Freq: Every day | ORAL | 0 refills | Status: DC

## 2024-05-10 MED ORDER — DARATUMUMAB-HYALURONIDASE-FIHJ 1800-30000 MG-UT/15ML ~~LOC~~ SOLN
1800.0000 mg | Freq: Once | SUBCUTANEOUS | Status: AC
  Administered 2024-05-10: 1800 mg via SUBCUTANEOUS
  Filled 2024-05-10: qty 15

## 2024-05-10 MED ORDER — DIPHENHYDRAMINE HCL 25 MG PO CAPS
25.0000 mg | ORAL_CAPSULE | Freq: Once | ORAL | Status: AC
  Administered 2024-05-10: 25 mg via ORAL
  Filled 2024-05-10: qty 1

## 2024-05-10 MED ORDER — BORTEZOMIB CHEMO SQ INJECTION 3.5 MG (2.5MG/ML)
1.5000 mg/m2 | Freq: Once | INTRAMUSCULAR | Status: AC
  Administered 2024-05-10: 2.25 mg via SUBCUTANEOUS
  Filled 2024-05-10: qty 0.9

## 2024-05-10 MED ORDER — ACETAMINOPHEN 325 MG PO TABS
650.0000 mg | ORAL_TABLET | Freq: Once | ORAL | Status: AC
  Administered 2024-05-10: 650 mg via ORAL
  Filled 2024-05-10: qty 2

## 2024-05-10 NOTE — Patient Instructions (Signed)
 [facility]  Discharge Instructions: Thank you for choosing [facility] to provide your oncology and hematology care.   If you have a lab appointment with the Cancer Center, please go directly to the Cancer Center and check in at the registration area.   Wear comfortable clothing and clothing appropriate for easy access to any Portacath or PICC line.   We strive to give you quality time with your provider. You may need to reschedule your appointment if you arrive late (15 or more minutes).  Arriving late affects you and other patients whose appointments are after yours.  Also, if you miss three or more appointments without notifying the office, you may be dismissed from the clinic at the provider's discretion.      For prescription refill requests, have your pharmacy contact our office and allow 72 hours for refills to be completed.    Today you received the following chemotherapy and/or immunotherapy agents: Velcade/Darzalex Faspro      To help prevent nausea and vomiting after your treatment, we encourage you to take your nausea medication as directed.  BELOW ARE SYMPTOMS THAT SHOULD BE REPORTED IMMEDIATELY: *FEVER GREATER THAN 100.4 F (38 C) OR HIGHER *CHILLS OR SWEATING *NAUSEA AND VOMITING THAT IS NOT CONTROLLED WITH YOUR NAUSEA MEDICATION *UNUSUAL SHORTNESS OF BREATH *UNUSUAL BRUISING OR BLEEDING *URINARY PROBLEMS (pain or burning when urinating, or frequent urination) *BOWEL PROBLEMS (unusual diarrhea, constipation, pain near the anus) TENDERNESS IN MOUTH AND THROAT WITH OR WITHOUT PRESENCE OF ULCERS (sore throat, sores in mouth, or a toothache) UNUSUAL RASH, SWELLING OR PAIN  UNUSUAL VAGINAL DISCHARGE OR ITCHING   Items with * indicate a potential emergency and should be followed up as soon as possible or go to the Emergency Department if any problems should occur.  Please show the CHEMOTHERAPY ALERT CARD or  IMMUNOTHERAPY ALERT CARD at check-in to the Emergency Department and triage nurse.  Should you have questions after your visit or need to cancel or reschedule your appointment, please contact [facility] - A DEPT OF [facility]  Dept: [phone]  and follow the prompts.  Office hours are 8:00 a.m. to 4:30 p.m. Monday - Friday. Please note that voicemails left after 4:00 p.m. may not be returned until the following business day.  We are closed weekends and major holidays. You have access to a nurse at all times for urgent questions. Please call the main number to the clinic Dept: [phone] and follow the prompts.   For any non-urgent questions, you may also contact your provider using MyChart. We now offer e-Visits for anyone 25 and older to request care online for non-urgent symptoms. For details visit [email or web address].   Also download the MyChart app! Go to the app store, search "MyChart", open the app, select [place], and log in with your MyChart username and password.

## 2024-05-10 NOTE — Progress Notes (Signed)
 Pt okay for tx today per Dr Salomon Cree with ALT of 107

## 2024-05-13 ENCOUNTER — Telehealth: Payer: Self-pay

## 2024-05-13 DIAGNOSIS — E039 Hypothyroidism, unspecified: Secondary | ICD-10-CM

## 2024-05-13 DIAGNOSIS — E1165 Type 2 diabetes mellitus with hyperglycemia: Secondary | ICD-10-CM

## 2024-05-13 MED ORDER — NOVOLOG FLEXPEN 100 UNIT/ML ~~LOC~~ SOPN: PEN_INJECTOR | SUBCUTANEOUS | 3 refills | Status: DC

## 2024-05-13 MED ORDER — BD PEN NEEDLE MINI U/F 31G X 5 MM MISC: 2 refills | Status: DC

## 2024-05-13 NOTE — Telephone Encounter (Signed)
 Chart reviewed, only 2 prescriptions sent on 05/05/24- I have resent them to [facility] for them.

## 2024-05-13 NOTE — Telephone Encounter (Signed)
 Copied from CRM [id number]. Topic: Clinical - Medication Question >> May 09, 2024 10:19 AM [place] E wrote: Reason for CRM: Patient's husbands called in regarding a few medications that were requested for a refill on 5/14, relayed to husband that the receipt was confirmed by the pharmacy on 5/15, stated he will follow up with the pharmacy towards the end of the week to see if they have the medications in. >> May 13, 2024 11:35 AM Clydene Darner H wrote: Patient called stating that he followed up with the pharmacy regarding his prescription refills and was informed that the prescriptions have not been received. He mentioned to the pharmacy that the receipt was previously confirmed on 05/15. The patient is now requesting that the prescription refills be resent to the pharmacy.

## 2024-05-17 ENCOUNTER — Other Ambulatory Visit

## 2024-05-17 ENCOUNTER — Ambulatory Visit

## 2024-05-19 ENCOUNTER — Other Ambulatory Visit: Payer: Self-pay

## 2024-05-19 DIAGNOSIS — E1165 Type 2 diabetes mellitus with hyperglycemia: Secondary | ICD-10-CM

## 2024-05-19 MED ORDER — BD PEN NEEDLE MINI U/F 31G X 5 MM MISC: 2 refills | Status: DC

## 2024-05-19 MED ORDER — NOVOLOG FLEXPEN 100 UNIT/ML ~~LOC~~ SOPN
PEN_INJECTOR | SUBCUTANEOUS | 3 refills | Status: AC
Start: 2024-05-19 — End: ?

## 2024-05-24 NOTE — Progress Notes (Signed)
 HEMATOLOGY/ONCOLOGY CLINIC NOTE  Date of Service: 05/25/2024  Patient Care Team: Crecencio Dodge, Candida Chalk, DO as PCP - General Loyde Rule, MD as PCP - Cardiology (Cardiology) Oris Birmingham, MD as Consulting Physician (Ophthalmology) Pietro Bridegroom, MD (Inactive) (Gastroenterology) Arnie Lao, MD as Consulting Physician (Orthopedic Surgery) Eduardo Grade, MD as Referring Physician (Dermatology) Percy Bracken, MD as Consulting Physician (Obstetrics and Gynecology) Frankie Israel, MD as Consulting Physician (Hematology)  CHIEF COMPLAINTS/PURPOSE OF CONSULTATION:  Evaluation and management of recently diagnosed Multiple myeloma   HISTORY OF PRESENTING ILLNESS:   Cynthia Burns is a wonderful 78 y.o. female who was scheduled to see Dr. Enedina Harrow as a new patient and was referred to the ED for evaluation and management of newly diagnosed myeloma with rapidly worsening hgb and renal insuffiencey with proteinuria. High suspicious of Multiple myeloma.    Outside labs done at Dr Bunnie Carol Singh's office (available in referral under media) show M spike of 6.1g/dl and Kappa light chains in the 400's.   Patient is accompanied by her husband and her daughter at bed side during the visit. Patient notes she has been having significantly more fatigue. Has required PRBC transfusions for symptomatic anemia.   She complains of fatigue, right ankle pain, and unexpected weight loss of around 25-30 lbs due to appetite loss in the past year. Her daughter notes that the patient has been confused more often as well.    She denies any new infection issues, fever, chills, back pain, chest pain, abdominal pain, or leg swelling.    Patient notes she tested positive for COVID-19 infection in August 2022 and was prescribed Paxlovid . However, she did not tolerate the medication due to allergic reaction.   PmHx of hypertension. She has been taking Losartan  and hydralazine .    Surgery history: Hysterectomy and Cholecystectomy.    She was started on dexamethasone  20 mg daily x 4 doses yesterday. She has been tolerating the high-dose steroid well.   INTERVAL HISTORY:  Cynthia Burns is a 78 y.o. female here for evaluation and management of newly diagnosed myeloma. She is here for toxicity check prior to cycle 4 day 1 of her treatment.   Patient was last seen by me on 04/19/2024 and reported mild agitation due to steroids.    Patient is accompanied by her husband and her daughter is also on call during today's visit.   She reports that steroids caused ankle and knee swelling. Patient reports that she only needed to take 1 fluid pill and her leg swelling eventually resolved. She denies any major leg swelling at this time.   She has been eating and sleeping well.   Patient reports that when her blood sugar is high, she endorses blurry vision. She has needed to address her blurry vision with eye drops in the middle of night.   Patient noted to have recent lab work with PCP. Her blood glucose levels have generally ranged 130s-150s as well as 114 on one occasion. She reports that her blood glucose levels generally average in 100s after her steroids wear off.   She reports that she is still not on blood pressure medication at this time. Patient notes that her blood pressure is generally around 125 systolic. Her BP in clinic today is 122/49.   Patient reports that she has gained 10 pounds recently. Her weight in clinic is noted to be 119 pounds.   Patient reports normal bowel habits. She denies any back pain, abdominal pain,  dizziness or lightheadedness.   She denies any tingling or numbness in her hands/feet.   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   HSV-1 infection    HYPERTENSION    Hypothyroid    MITRAL VALVE PROLAPSE    OSTEOPENIA    Overweight(278.02)    PHARYNGITIS, ACUTE     SURGICAL HISTORY: Past Surgical History:  Procedure Laterality Date   ABDOMINAL HYSTERECTOMY     CHOLECYSTECTOMY     prolped bladder     WISDOM TOOTH EXTRACTION      SOCIAL HISTORY: Social History   Socioeconomic History   Marital status: Married    Spouse name: Not on file   Number of children: Not on file   Years of education: Not on file   Highest education level: Associate degree: occupational, Scientist, product/process development, or vocational program  Occupational History   Occupation: hairdresser  Tobacco Use   Smoking status: Never   Smokeless tobacco: Never  Vaping Use   Vaping status: Never Used  Substance and Sexual Activity   Alcohol use: No    Alcohol/week: 0.0 standard drinks of alcohol   Drug use: No   Sexual activity: Not Currently    Partners: Male    Birth control/protection: Surgical, Abstinence    Comment: hysterectomy, 17, less than 5  Other Topics Concern   Not on file  Social History Narrative   Not on file   Social Drivers of Health   Financial Resource Strain: Low Risk  (12/07/2023)   Overall Financial Resource Strain (CARDIA)    Difficulty of Paying Living Expenses: Not hard at all  Food Insecurity: Patient Declined (02/29/2024)   Hunger Vital Sign    Worried About Running Out of Food in the Last Year: Patient declined    Ran Out of Food in the Last Year: Patient declined  Transportation Needs: No Transportation Needs (02/29/2024)   PRAPARE - Administrator, Civil Service (Medical): No    Lack of Transportation (Non-Medical): No  Physical Activity: Insufficiently Active (12/07/2023)   Exercise Vital Sign    Days of Exercise per Week: 1 day    Minutes of Exercise per Session: 10 min  Stress: No Stress Concern Present (12/07/2023)   [employer] of Occupational Health - Occupational Stress Questionnaire    Feeling of Stress : Only a little  Social Connections: Socially Integrated (02/29/2024)   Social Connection and Isolation Panel [NHANES]    Frequency of Communication with Friends and Family: More than three times a week    Frequency of Social Gatherings with Friends and Family: More than three times a week    Attends Religious Services: More than 4 times per year    Active Member of [employer] or Organizations: Yes    Attends Banker Meetings: More than 4 times per year    Marital Status: Married  Catering manager Violence: Not At Risk (02/29/2024)   Humiliation, Afraid, Rape, and Kick questionnaire    Fear of Current or Ex-Partner: No    Emotionally Abused: No    Physically Abused: No    Sexually Abused: No  FAMILY HISTORY: Family History  Problem Relation Age of Onset   Hypertension Mother    Heart disease Mother        Had a stent   Emphysema Father    Diabetes Father    COPD Father    Stroke Sister    Breast cancer Neg Hx     ALLERGIES:  is allergic to mucinex [guaifenesin er], bystolic  [nebivolol  hcl], calcium -containing compounds, codeine, paxlovid  [nirmatrelvir -ritonavir ], advicor [niacin-lovastatin er], amlodipine, lisinopril-hydrochlorothiazide, other, ramipril, and sulfonamide derivatives.  MEDICATIONS:  Current Outpatient Medications  Medication Sig Dispense Refill   acyclovir  (ZOVIRAX ) 400 MG tablet Take 1 tablet (400 mg total) by mouth 2 (two) times daily. 60 tablet 5   amoxicillin  (AMOXIL ) 500 MG capsule Take 2,000 mg by mouth See admin instructions. Take 2,000 mg by mouth one hour prior to dental visits     Biotin 5000 MCG CAPS Take 5,000 mcg by mouth daily.     Blood Glucose Monitoring Suppl (ONE TOUCH ULTRA 2) w/Device KIT CHECK BLOOD SUGAR TWICE DAILY.  DX CODE  E11.9 1 kit 0   Cholecalciferol (VITAMIN D3) 1000 units CAPS Take 1,000 Units by mouth daily.     clonazePAM   (KLONOPIN ) 0.5 MG tablet Take 1 tablet (0.5 mg total) by mouth 2 (two) times daily as needed for anxiety. 30 tablet 0   Cyanocobalamin  (VITAMIN B-12) 3000 MCG SUBL Place 3,000 mcg under the tongue daily in the afternoon.     dexamethasone  (DECADRON ) 4 MG tablet Take 5 tablets (20 mg) by mouth with breakfast the day after every daratumumab  dose 30 tablet 1   fexofenadine (ALLEGRA) 180 MG tablet Take 180 mg by mouth daily.     fexofenadine-pseudoephedrine (ALLEGRA-D 24) 180-240 MG 24 hr tablet Take 1 tablet by mouth daily.     furosemide  (LASIX ) 20 MG tablet Take 1 tablet (20 mg total) by mouth daily. 30 tablet 0   glucose blood (ONETOUCH ULTRA TEST) test strip Use as instructed 200 each 3   [Paused] hydrALAZINE  (APRESOLINE ) 25 MG tablet Take 25 mg by mouth See admin instructions. Take 25 mg by mouth at 8 AM, 3 PM, and 11 PM- in conjunction with one 50 mg tablet to equal a total dose of 75 mg     insulin  aspart (NOVOLOG  FLEXPEN) 100 UNIT/ML FlexPen Per sliding scale max 40 u daily 15 mL 3   Insulin  Pen Needle (B-D UF III MINI PEN NEEDLES) 31G X 5 MM MISC To use w/ Novolog  100 each 2   Lancets (ONETOUCH DELICA PLUS LANCET33G) MISC USE 1 LANCET TO TEST AS DIRECTED (DISCARD LANCET IN SHARPS CONTAINER IMMEDIATELY AFTER USE) 200 each 1   levothyroxine  (SYNTHROID ) 25 MCG tablet TAKE ONE TABLET BY MOUTH EVERY DAY BEFORE BREAKFAST 90 tablet 1   Multiple Vitamins-Minerals (PRESERVISION AREDS 2) CAPS Take 1 capsule by mouth 2 (two) times daily with a meal.     ondansetron  (ZOFRAN ) 8 MG tablet Take 8 mg by mouth 30 to 60 min prior to Cyclophosphamide administration then take 8 mg every 8 hrs as needed for nausea and vomiting. 30 tablet 1   pantoprazole  (PROTONIX ) 40 MG tablet Pantoprazole  40 mg before breakfast and dinner for 90 days with 3 refills (Patient taking differently: Take 40 mg by mouth See admin instructions. Take 40 mg by mouth 30 minutes before lunch and supper) 180 tablet 3   prochlorperazine   (COMPAZINE ) 10 MG tablet Take 1 tablet (10 mg total) by mouth every 6 (six) hours as needed for  nausea or vomiting. 30 tablet 1   rosuvastatin  (CRESTOR ) 20 MG tablet TAKE ONE TABLET BY MOUTH EVERY DAY 90 tablet 3   RYBELSUS  7 MG TABS TAKE ONE TABLET BY MOUTH EVERY DAY - (TAKE WITH NO MORE THAN 4 OUNCES OF PLAIN WATER AT LEAST 30 MINUTES BEFORE THE FIRST FOOD, BEVERAGE, OR OTHER MEDICATIONS OF THE DAY) 90 tablet 0   SYSTANE ULTRA PF 0.4-0.3 % SOLN Place 1 drop into both eyes See admin instructions. Instill 1 drop into both eyes one to four times a day     No current facility-administered medications for this visit.    REVIEW OF SYSTEMS:    10 Point review of Systems was done is negative except as noted above.  PHYSICAL EXAMINATION:  ECOG PERFORMANCE STATUS: 2 - Symptomatic, <50% confined to bed  Vitals:   05/25/24 0942 05/25/24 0944  BP: (!) 146/55 (!) 122/49  Pulse: 69   Resp: 17   Temp: (!) 97.5 F (36.4 C)    Filed Weights   05/25/24 0942  Weight: 119 lb 12.8 oz (54.3 kg)  .Body mass index is 22.64 kg/m.   GENERAL:alert, in no acute distress and comfortable SKIN: no acute rashes, no significant lesions EYES: conjunctiva are pink and non-injected, sclera anicteric OROPHARYNX: MMM, no exudates, no oropharyngeal erythema or ulceration NECK: supple, no JVD LYMPH:  no palpable lymphadenopathy in the cervical, axillary or inguinal regions LUNGS: clear to auscultation b/l with normal respiratory effort HEART: regular rate & rhythm ABDOMEN:  normoactive bowel sounds , non tender, not distended. Extremity: no pedal edema PSYCH: alert & oriented x 3 with fluent speech NEURO: no focal motor/sensory deficits   LABORATORY DATA:  I have reviewed the data as listed  .    Latest Ref Rng & Units 05/25/2024    9:01 AM 05/10/2024    9:07 AM 05/04/2024    9:26 AM  CBC  WBC 4.0 - 10.5 K/uL 6.1  7.2  6.6   Hemoglobin 12.0 - 15.0 g/dL 16.1  09.6  04.5   Hematocrit 36.0 - 46.0 % 32.3   30.7  30.5   Platelets 150 - 400 K/uL 225  238  197        Latest Ref Rng & Units 05/25/2024    9:01 AM 05/10/2024    9:07 AM 05/04/2024    9:26 AM  CMP  Glucose 70 - 99 mg/dL 409  811  914   BUN 8 - 23 mg/dL 66  51  37   Creatinine 0.44 - 1.00 mg/dL 7.82  9.56  2.13   Sodium 135 - 145 mmol/L 138  138  138   Potassium 3.5 - 5.1 mmol/L 3.9  4.1  4.1   Chloride 98 - 111 mmol/L 107  105  106   CO2 22 - 32 mmol/L 25  28  28    Calcium  8.9 - 10.3 mg/dL 9.2  8.7  8.5   Total Protein 6.5 - 8.1 g/dL 6.5  5.9  5.8   Total Bilirubin 0.0 - 1.2 mg/dL 0.3  0.3  0.2   Alkaline Phos 38 - 126 U/L 50  49  46   AST 15 - 41 U/L 23  40  45   ALT 0 - 44 U/L 29  107  74    Bone marrow biopsy 02/01/2024:    Surgical pathology 01/29/2024:    RADIOGRAPHIC STUDIES: I have personally reviewed the radiological images as listed and agreed with the findings in the report.  No results found.   ASSESSMENT & PLAN:  78 y.o. female with:  Newly diagnosed Multiple myeloma - Lambda restricted plasma cell neoplasm comprising greater than 75% of  the cellular marrow 2. Acute on CKD 3. HTN 4. DM2 5. Hypothyroidism  Patient Active Problem List   Diagnosis Date Noted   Symptomatic anemia 01/29/2024   Multiple myeloma (HCC) 01/29/2024   Acute renal failure superimposed on stage 3b chronic kidney disease (HCC) 01/29/2024   Anemia 11/26/2023   Hypothyroidism 02/23/2023   Type 2 diabetes mellitus with hyperglycemia, without long-term current use of insulin  (HCC) 07/26/2021   Impacted cerumen of right ear 07/06/2020   Type 2 diabetes mellitus with hyperglycemia (HCC) 04/11/2019   Hyperlipidemia LDL goal <70 03/23/2017   Abdominal pain, epigastric 09/03/2016   Strain of right biceps 03/17/2016   Right shoulder pain 03/17/2016   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   Plantar wart of right foot 03/15/2015   Uncontrolled type 2 diabetes mellitus with hyperglycemia (HCC) 09/16/2013   Elevated lipids 09/05/2013   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 Hyponatremia (from 02/23/2023) chronic. Recent thyroid  function tests WNL. 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  PLAN:  -Discussed lab results on 05/25/2024 in detail with patient. CBC showed WBC of 6.1K, hemoglobin of 10.6, and platelets of 225K. -hgb continues to improve -WBC and platelets normal -CMP shows normal calcium  and kidney function with creatinine 1.09 -albumin improved to 3.9 g/dL -after cycle 2 of treatment, her M protein came down to 0.7 g/dL one month ago from 6.5 g/dL four months ago.  -discussed goal to try to achieve very good partial response in 4-6 cycles of treatment. Patient has had a nearly 90% reduction in her M protein.  -K/L light chains normalized -kappa free light chains 4.5 -K/L ratio 0.36 -did not feel any enlarged lymph nodes during physical examination -we will cut down her post-treatment Dexamethasone  to 2 tablets (8 MG) at this time, then eventually 1 tablet -we will continue her pre-treatment steroids -Patient has been tolerating her treatment well without any new or severe toxicities.  -patient is appropriate to proceed with cycle 4 of treatment at this time -discussed that there would be a role to decide whether she is inclined to have a referral placed to have a discussion in regards to whether she might be a candidate for autologous bone marrow transplant down the line.  -Discussed that bone marrow transplant may potentially result in a longer period of remission, though it would not be curative.  -discussed details of bone marrow transplant process, including involvement of Melphalan high-dose chemotherapy and recovery time after the procedure.   -discussed potential side effects such as infections, mouth sores, and diarrhea  -discussed that there is no strong recommendation for bone marrow transplant, but it would be an option if she chooses to consider it -patient shall let us  know if she is inclined to consider an autologous bone marrow transplant -discussed that after she has the deepest response to her current treatment, there would be a role for maintenance treatment, either after her current treatment, or after bone marrow transplant if she chooses to consider it -Discussed that maintenance treatment can be potentially less immunosuppressive than her current treatment -discussed that we would generally plan for PET scan and  repeat bone marrow biopsy before switching to maintenance treatment -discussed that the risk of infection in the context of myeloma is due to a lack of antibody response  -recommend taking reasonable infection precautions -recommend continuing to optimize nutrition, staying physically active, and limiting stress -recommend checking her blood pressure levels at home regularly first thing in the mornings -discussed that her dry eyes could be from treatment or fluctuating blood sugars -recommend using artificial tear drops during the day and artifical tear gel at night before bed -discussed that with treatment steroids, her A1C will not be representative of what needs to be done in regards to managing her DM medications -answered all of patient's and her family member's questions in detail -patient shall return to clinic in 1 month  FOLLOW-UP: Per integrated scheduling plz schedule next 2 cycles of treatment MD visit in 4 weeks  The total time spent in the appointment was 40 minutes* .  All of the patient's questions were answered with apparent satisfaction. The patient knows to call the clinic with any problems, questions or concerns.   Jacquelyn Matt MD MS AAHIVMS [facility] Hematology/Oncology Physician [facility]  .*Total Encounter Time as defined by the Centers for Medicare and Medicaid Services includes, in addition to the face-to-face time of a patient visit (documented in the note above) non-face-to-face time: obtaining and reviewing outside history, ordering and reviewing medications, tests or procedures, care coordination (communications with other health care professionals or caregivers) and documentation in the medical record.    I,Mitra Faeizi,acting as a Neurosurgeon for Jacquelyn Matt, MD.,have documented all relevant documentation on the behalf of Jacquelyn Matt, MD,as directed by  Jacquelyn Matt, MD while in the presence of Jacquelyn Matt, MD.  .I have reviewed the above documentation for accuracy and completeness, and I agree with the above. .Jovian Lembcke Kishore Fiorella Hanahan MD

## 2024-05-25 ENCOUNTER — Inpatient Hospital Stay: Attending: Hematology

## 2024-05-25 ENCOUNTER — Inpatient Hospital Stay (HOSPITAL_BASED_OUTPATIENT_CLINIC_OR_DEPARTMENT_OTHER): Admitting: Hematology

## 2024-05-25 ENCOUNTER — Inpatient Hospital Stay

## 2024-05-25 VITALS — BP 122/49 | HR 69 | Temp 97.5°F | Resp 17 | Ht 61.0 in | Wt 119.8 lb

## 2024-05-25 DIAGNOSIS — Z79899 Other long term (current) drug therapy: Secondary | ICD-10-CM | POA: Diagnosis not present

## 2024-05-25 DIAGNOSIS — Z79620 Long term (current) use of immunosuppressive biologic: Secondary | ICD-10-CM | POA: Insufficient documentation

## 2024-05-25 DIAGNOSIS — C9000 Multiple myeloma not having achieved remission: Secondary | ICD-10-CM

## 2024-05-25 DIAGNOSIS — Z5112 Encounter for antineoplastic immunotherapy: Secondary | ICD-10-CM | POA: Diagnosis not present

## 2024-05-25 DIAGNOSIS — Z5111 Encounter for antineoplastic chemotherapy: Secondary | ICD-10-CM

## 2024-05-25 LAB — CMP (CANCER CENTER ONLY)
ALT: 29 U/L (ref 0–44)
AST: 23 U/L (ref 15–41)
Albumin: 3.9 g/dL (ref 3.5–5.0)
Alkaline Phosphatase: 50 U/L (ref 38–126)
Anion gap: 6 (ref 5–15)
BUN: 66 mg/dL — ABNORMAL HIGH (ref 8–23)
CO2: 25 mmol/L (ref 22–32)
Calcium: 9.2 mg/dL (ref 8.9–10.3)
Chloride: 107 mmol/L (ref 98–111)
Creatinine: 1.09 mg/dL — ABNORMAL HIGH (ref 0.44–1.00)
GFR, Estimated: 53 mL/min — ABNORMAL LOW (ref >60)
Glucose, Bld: 129 mg/dL — ABNORMAL HIGH (ref 70–99)
Potassium: 3.9 mmol/L (ref 3.5–5.1)
Sodium: 138 mmol/L (ref 135–145)
Total Bilirubin: 0.3 mg/dL (ref 0.0–1.2)
Total Protein: 6.5 g/dL (ref 6.5–8.1)

## 2024-05-25 LAB — CBC WITH DIFFERENTIAL (CANCER CENTER ONLY)
Abs Immature Granulocytes: 0.01 10*3/uL (ref 0.00–0.07)
Basophils Absolute: 0 10*3/uL (ref 0.0–0.1)
Basophils Relative: 1 %
Eosinophils Absolute: 0.1 10*3/uL (ref 0.0–0.5)
Eosinophils Relative: 1 %
HCT: 32.3 % — ABNORMAL LOW (ref 36.0–46.0)
Hemoglobin: 10.6 g/dL — ABNORMAL LOW (ref 12.0–15.0)
Immature Granulocytes: 0 %
Lymphocytes Relative: 27 %
Lymphs Abs: 1.6 10*3/uL (ref 0.7–4.0)
MCH: 31.2 pg (ref 26.0–34.0)
MCHC: 32.8 g/dL (ref 30.0–36.0)
MCV: 95 fL (ref 80.0–100.0)
Monocytes Absolute: 0.4 10*3/uL (ref 0.1–1.0)
Monocytes Relative: 7 %
Neutro Abs: 3.9 10*3/uL (ref 1.7–7.7)
Neutrophils Relative %: 64 %
Platelet Count: 225 10*3/uL (ref 150–400)
RBC: 3.4 MIL/uL — ABNORMAL LOW (ref 3.87–5.11)
RDW: 14 % (ref 11.5–15.5)
WBC Count: 6.1 10*3/uL (ref 4.0–10.5)
nRBC: 0 % (ref 0.0–0.2)

## 2024-05-25 MED ORDER — DARATUMUMAB-HYALURONIDASE-FIHJ 1800-30000 MG-UT/15ML ~~LOC~~ SOLN
1800.0000 mg | Freq: Once | SUBCUTANEOUS | Status: AC
  Administered 2024-05-25: 1800 mg via SUBCUTANEOUS
  Filled 2024-05-25: qty 15

## 2024-05-25 MED ORDER — ACETAMINOPHEN 325 MG PO TABS
650.0000 mg | ORAL_TABLET | Freq: Once | ORAL | Status: AC
  Administered 2024-05-25: 650 mg via ORAL
  Filled 2024-05-25: qty 2

## 2024-05-25 MED ORDER — DIPHENHYDRAMINE HCL 25 MG PO CAPS
25.0000 mg | ORAL_CAPSULE | Freq: Once | ORAL | Status: AC
  Administered 2024-05-25: 25 mg via ORAL
  Filled 2024-05-25: qty 1

## 2024-05-25 MED ORDER — BORTEZOMIB CHEMO SQ INJECTION 3.5 MG (2.5MG/ML)
1.5000 mg/m2 | Freq: Once | INTRAMUSCULAR | Status: AC
  Administered 2024-05-25: 2.25 mg via SUBCUTANEOUS
  Filled 2024-05-25: qty 0.9

## 2024-05-25 MED ORDER — DEXAMETHASONE 4 MG PO TABS
20.0000 mg | ORAL_TABLET | Freq: Once | ORAL | Status: AC
  Administered 2024-05-25: 20 mg via ORAL
  Filled 2024-05-25: qty 5

## 2024-05-25 NOTE — Patient Instructions (Signed)
 [facility]  Discharge Instructions: Thank you for choosing [facility] to provide your oncology and hematology care.   If you have a lab appointment with the Cancer Center, please go directly to the Cancer Center and check in at the registration area.   Wear comfortable clothing and clothing appropriate for easy access to any Portacath or PICC line.   We strive to give you quality time with your provider. You may need to reschedule your appointment if you arrive late (15 or more minutes).  Arriving late affects you and other patients whose appointments are after yours.  Also, if you miss three or more appointments without notifying the office, you may be dismissed from the clinic at the provider's discretion.      For prescription refill requests, have your pharmacy contact our office and allow 72 hours for refills to be completed.    Today you received the following chemotherapy and/or immunotherapy agents: Velcade/Darzalex Faspro      To help prevent nausea and vomiting after your treatment, we encourage you to take your nausea medication as directed.  BELOW ARE SYMPTOMS THAT SHOULD BE REPORTED IMMEDIATELY: *FEVER GREATER THAN 100.4 F (38 C) OR HIGHER *CHILLS OR SWEATING *NAUSEA AND VOMITING THAT IS NOT CONTROLLED WITH YOUR NAUSEA MEDICATION *UNUSUAL SHORTNESS OF BREATH *UNUSUAL BRUISING OR BLEEDING *URINARY PROBLEMS (pain or burning when urinating, or frequent urination) *BOWEL PROBLEMS (unusual diarrhea, constipation, pain near the anus) TENDERNESS IN MOUTH AND THROAT WITH OR WITHOUT PRESENCE OF ULCERS (sore throat, sores in mouth, or a toothache) UNUSUAL RASH, SWELLING OR PAIN  UNUSUAL VAGINAL DISCHARGE OR ITCHING   Items with * indicate a potential emergency and should be followed up as soon as possible or go to the Emergency Department if any problems should occur.  Please show the CHEMOTHERAPY ALERT CARD or  IMMUNOTHERAPY ALERT CARD at check-in to the Emergency Department and triage nurse.  Should you have questions after your visit or need to cancel or reschedule your appointment, please contact [facility] - A DEPT OF [facility]  Dept: [phone]  and follow the prompts.  Office hours are 8:00 a.m. to 4:30 p.m. Monday - Friday. Please note that voicemails left after 4:00 p.m. may not be returned until the following business day.  We are closed weekends and major holidays. You have access to a nurse at all times for urgent questions. Please call the main number to the clinic Dept: [phone] and follow the prompts.   For any non-urgent questions, you may also contact your provider using MyChart. We now offer e-Visits for anyone 25 and older to request care online for non-urgent symptoms. For details visit [email or web address].   Also download the MyChart app! Go to the app store, search "MyChart", open the app, select [place], and log in with your MyChart username and password.

## 2024-05-25 NOTE — Progress Notes (Signed)
 Patient seen by Dr. Addison Naegeli are within treatment parameters.  Labs reviewed: and are within treatment parameters.  Per physician team, patient is ready for treatment and there are NO modifications to the treatment plan.

## 2024-05-26 ENCOUNTER — Other Ambulatory Visit: Payer: Self-pay

## 2024-05-26 LAB — KAPPA/LAMBDA LIGHT CHAINS
Kappa free light chain: 4.5 mg/L (ref 3.3–19.4)
Kappa, lambda light chain ratio: 0.36 (ref 0.26–1.65)
Lambda free light chains: 12.4 mg/L (ref 5.7–26.3)

## 2024-05-30 ENCOUNTER — Telehealth: Payer: Self-pay | Admitting: Family Medicine

## 2024-05-30 NOTE — Telephone Encounter (Signed)
 Copied from CRM [id number]. Topic: Medicare AWV >> May 30, 2024  1:46 PM Juliana Ocean wrote: Reason for CRM: LVM 05/30/2024 to schedule AWV. Please schedule Virtual or Telehealth visits ONLY.   Rosalee Collins; Care Guide Ambulatory Clinical Support [place] [facility] Direct Dial: [phone]

## 2024-05-31 ENCOUNTER — Encounter: Payer: Self-pay | Admitting: Hematology

## 2024-05-31 LAB — MULTIPLE MYELOMA PANEL, SERUM
Albumin SerPl Elph-Mcnc: 3.4 g/dL (ref 2.9–4.4)
Albumin/Glob SerPl: 1.3 (ref 0.7–1.7)
Alpha 1: 0.3 g/dL (ref 0.0–0.4)
Alpha2 Glob SerPl Elph-Mcnc: 0.7 g/dL (ref 0.4–1.0)
B-Globulin SerPl Elph-Mcnc: 1 g/dL (ref 0.7–1.3)
Gamma Glob SerPl Elph-Mcnc: 0.8 g/dL (ref 0.4–1.8)
Globulin, Total: 2.8 g/dL (ref 2.2–3.9)
IgA: <5 mg/dL — ABNORMAL LOW (ref 64–422)
IgG (Immunoglobin G), Serum: 889 mg/dL (ref 586–1602)
IgM (Immunoglobulin M), Srm: 17 mg/dL — ABNORMAL LOW (ref 26–217)
M Protein SerPl Elph-Mcnc: 0.6 g/dL — ABNORMAL HIGH
Total Protein ELP: 6.2 g/dL (ref 6.0–8.5)

## 2024-06-01 ENCOUNTER — Inpatient Hospital Stay

## 2024-06-01 ENCOUNTER — Other Ambulatory Visit: Payer: Self-pay | Admitting: Hematology

## 2024-06-01 VITALS — BP 155/73 | HR 64 | Temp 97.7°F | Resp 16 | Wt 119.8 lb

## 2024-06-01 DIAGNOSIS — Z79899 Other long term (current) drug therapy: Secondary | ICD-10-CM | POA: Diagnosis not present

## 2024-06-01 DIAGNOSIS — C9000 Multiple myeloma not having achieved remission: Secondary | ICD-10-CM

## 2024-06-01 DIAGNOSIS — Z5112 Encounter for antineoplastic immunotherapy: Secondary | ICD-10-CM | POA: Diagnosis not present

## 2024-06-01 DIAGNOSIS — Z79620 Long term (current) use of immunosuppressive biologic: Secondary | ICD-10-CM | POA: Diagnosis not present

## 2024-06-01 LAB — CBC WITH DIFFERENTIAL (CANCER CENTER ONLY)
Abs Immature Granulocytes: 0.02 10*3/uL (ref 0.00–0.07)
Basophils Absolute: 0 10*3/uL (ref 0.0–0.1)
Basophils Relative: 0 %
Eosinophils Absolute: 0.1 10*3/uL (ref 0.0–0.5)
Eosinophils Relative: 2 %
HCT: 34.1 % — ABNORMAL LOW (ref 36.0–46.0)
Hemoglobin: 11.4 g/dL — ABNORMAL LOW (ref 12.0–15.0)
Immature Granulocytes: 0 %
Lymphocytes Relative: 26 %
Lymphs Abs: 1.7 10*3/uL (ref 0.7–4.0)
MCH: 31.8 pg (ref 26.0–34.0)
MCHC: 33.4 g/dL (ref 30.0–36.0)
MCV: 95.3 fL (ref 80.0–100.0)
Monocytes Absolute: 0.6 10*3/uL (ref 0.1–1.0)
Monocytes Relative: 10 %
Neutro Abs: 4.1 10*3/uL (ref 1.7–7.7)
Neutrophils Relative %: 62 %
Platelet Count: 221 10*3/uL (ref 150–400)
RBC: 3.58 MIL/uL — ABNORMAL LOW (ref 3.87–5.11)
RDW: 13.9 % (ref 11.5–15.5)
WBC Count: 6.5 10*3/uL (ref 4.0–10.5)
nRBC: 0 % (ref 0.0–0.2)

## 2024-06-01 LAB — CMP (CANCER CENTER ONLY)
ALT: 50 U/L — ABNORMAL HIGH (ref 0–44)
AST: 24 U/L (ref 15–41)
Albumin: 4 g/dL (ref 3.5–5.0)
Alkaline Phosphatase: 53 U/L (ref 38–126)
Anion gap: 7 (ref 5–15)
BUN: 58 mg/dL — ABNORMAL HIGH (ref 8–23)
CO2: 27 mmol/L (ref 22–32)
Calcium: 9.2 mg/dL (ref 8.9–10.3)
Chloride: 105 mmol/L (ref 98–111)
Creatinine: 1.18 mg/dL — ABNORMAL HIGH (ref 0.44–1.00)
GFR, Estimated: 48 mL/min — ABNORMAL LOW (ref >60)
Glucose, Bld: 126 mg/dL — ABNORMAL HIGH (ref 70–99)
Potassium: 3.7 mmol/L (ref 3.5–5.1)
Sodium: 139 mmol/L (ref 135–145)
Total Bilirubin: 0.3 mg/dL (ref 0.0–1.2)
Total Protein: 6.3 g/dL — ABNORMAL LOW (ref 6.5–8.1)

## 2024-06-01 MED ORDER — BORTEZOMIB CHEMO SQ INJECTION 3.5 MG (2.5MG/ML)
1.5000 mg/m2 | Freq: Once | INTRAMUSCULAR | Status: AC
  Administered 2024-06-01: 2.25 mg via SUBCUTANEOUS
  Filled 2024-06-01: qty 0.9

## 2024-06-01 MED ORDER — DEXAMETHASONE 4 MG PO TABS
20.0000 mg | ORAL_TABLET | Freq: Once | ORAL | Status: AC
  Administered 2024-06-01: 20 mg via ORAL
  Filled 2024-06-01: qty 5

## 2024-06-01 NOTE — Patient Instructions (Signed)
 [facility] - A DEPT OF [facility]  Discharge Instructions: Thank you for choosing [facility] to provide your oncology and hematology care.   If you have a lab appointment with the Cancer Center, please go directly to the Cancer Center and check in at the registration area.   Wear comfortable clothing and clothing appropriate for easy access to any Portacath or PICC line.   We strive to give you quality time with your provider. You may need to reschedule your appointment if you arrive late (15 or more minutes).  Arriving late affects you and other patients whose appointments are after yours.  Also, if you miss three or more appointments without notifying the office, you may be dismissed from the clinic at the provider's discretion.      For prescription refill requests, have your pharmacy contact our office and allow 72 hours for refills to be completed.    Today you received the following chemotherapy and/or immunotherapy agents: Velcade    To help prevent nausea and vomiting after your treatment, we encourage you to take your nausea medication as directed.  BELOW ARE SYMPTOMS THAT SHOULD BE REPORTED IMMEDIATELY: *FEVER GREATER THAN 100.4 F (38 C) OR HIGHER *CHILLS OR SWEATING *NAUSEA AND VOMITING THAT IS NOT CONTROLLED WITH YOUR NAUSEA MEDICATION *UNUSUAL SHORTNESS OF BREATH *UNUSUAL BRUISING OR BLEEDING *URINARY PROBLEMS (pain or burning when urinating, or frequent urination) *BOWEL PROBLEMS (unusual diarrhea, constipation, pain near the anus) TENDERNESS IN MOUTH AND THROAT WITH OR WITHOUT PRESENCE OF ULCERS (sore throat, sores in mouth, or a toothache) UNUSUAL RASH, SWELLING OR PAIN  UNUSUAL VAGINAL DISCHARGE OR ITCHING   Items with * indicate a potential emergency and should be followed up as soon as possible or go to the Emergency Department if any problems should occur.  Please show the CHEMOTHERAPY ALERT CARD or IMMUNOTHERAPY  ALERT CARD at check-in to the Emergency Department and triage nurse.  Should you have questions after your visit or need to cancel or reschedule your appointment, please contact [facility] - A DEPT OF [facility]  Dept: [phone]  and follow the prompts.  Office hours are 8:00 a.m. to 4:30 p.m. Monday - Friday. Please note that voicemails left after 4:00 p.m. may not be returned until the following business day.  We are closed weekends and major holidays. You have access to a nurse at all times for urgent questions. Please call the main number to the clinic Dept: [phone] and follow the prompts.   For any non-urgent questions, you may also contact your provider using MyChart. We now offer e-Visits for anyone 69 and older to request care online for non-urgent symptoms. For details visit [email or web address].   Also download the MyChart app! Go to the app store, search "MyChart", open the app, select , and log in with your MyChart username and password.

## 2024-06-08 ENCOUNTER — Inpatient Hospital Stay

## 2024-06-08 ENCOUNTER — Ambulatory Visit: Admitting: Hematology

## 2024-06-08 ENCOUNTER — Other Ambulatory Visit

## 2024-06-08 VITALS — BP 134/76 | HR 74 | Temp 97.7°F | Wt 118.5 lb

## 2024-06-08 DIAGNOSIS — Z79620 Long term (current) use of immunosuppressive biologic: Secondary | ICD-10-CM | POA: Diagnosis not present

## 2024-06-08 DIAGNOSIS — C9000 Multiple myeloma not having achieved remission: Secondary | ICD-10-CM

## 2024-06-08 DIAGNOSIS — Z79899 Other long term (current) drug therapy: Secondary | ICD-10-CM | POA: Diagnosis not present

## 2024-06-08 DIAGNOSIS — Z5112 Encounter for antineoplastic immunotherapy: Secondary | ICD-10-CM | POA: Diagnosis not present

## 2024-06-08 LAB — CMP (CANCER CENTER ONLY)
ALT: 78 U/L — ABNORMAL HIGH (ref 0–44)
AST: 27 U/L (ref 15–41)
Albumin: 4.3 g/dL (ref 3.5–5.0)
Alkaline Phosphatase: 54 U/L (ref 38–126)
Anion gap: 6 (ref 5–15)
BUN: 54 mg/dL — ABNORMAL HIGH (ref 8–23)
CO2: 28 mmol/L (ref 22–32)
Calcium: 9.5 mg/dL (ref 8.9–10.3)
Chloride: 105 mmol/L (ref 98–111)
Creatinine: 1.27 mg/dL — ABNORMAL HIGH (ref 0.44–1.00)
GFR, Estimated: 44 mL/min — ABNORMAL LOW (ref >60)
Glucose, Bld: 145 mg/dL — ABNORMAL HIGH (ref 70–99)
Potassium: 4.1 mmol/L (ref 3.5–5.1)
Sodium: 139 mmol/L (ref 135–145)
Total Bilirubin: 0.4 mg/dL (ref 0.0–1.2)
Total Protein: 7 g/dL (ref 6.5–8.1)

## 2024-06-08 LAB — CBC WITH DIFFERENTIAL (CANCER CENTER ONLY)
Abs Immature Granulocytes: 0.03 10*3/uL (ref 0.00–0.07)
Basophils Absolute: 0 10*3/uL (ref 0.0–0.1)
Basophils Relative: 0 %
Eosinophils Absolute: 0.1 10*3/uL (ref 0.0–0.5)
Eosinophils Relative: 1 %
HCT: 36.9 % (ref 36.0–46.0)
Hemoglobin: 12.2 g/dL (ref 12.0–15.0)
Immature Granulocytes: 0 %
Lymphocytes Relative: 21 %
Lymphs Abs: 2 10*3/uL (ref 0.7–4.0)
MCH: 31.2 pg (ref 26.0–34.0)
MCHC: 33.1 g/dL (ref 30.0–36.0)
MCV: 94.4 fL (ref 80.0–100.0)
Monocytes Absolute: 0.5 10*3/uL (ref 0.1–1.0)
Monocytes Relative: 5 %
Neutro Abs: 6.9 10*3/uL (ref 1.7–7.7)
Neutrophils Relative %: 73 %
Platelet Count: 225 10*3/uL (ref 150–400)
RBC: 3.91 MIL/uL (ref 3.87–5.11)
RDW: 13.6 % (ref 11.5–15.5)
WBC Count: 9.5 10*3/uL (ref 4.0–10.5)
nRBC: 0 % (ref 0.0–0.2)

## 2024-06-08 MED ORDER — ACETAMINOPHEN 325 MG PO TABS
650.0000 mg | ORAL_TABLET | Freq: Once | ORAL | Status: AC
  Administered 2024-06-08: 650 mg via ORAL
  Filled 2024-06-08: qty 2

## 2024-06-08 MED ORDER — DARATUMUMAB-HYALURONIDASE-FIHJ 1800-30000 MG-UT/15ML ~~LOC~~ SOLN
1800.0000 mg | Freq: Once | SUBCUTANEOUS | Status: AC
  Administered 2024-06-08: 1800 mg via SUBCUTANEOUS
  Filled 2024-06-08: qty 15

## 2024-06-08 MED ORDER — DEXAMETHASONE 4 MG PO TABS
20.0000 mg | ORAL_TABLET | Freq: Once | ORAL | Status: AC
  Administered 2024-06-08: 20 mg via ORAL
  Filled 2024-06-08: qty 5

## 2024-06-08 MED ORDER — BORTEZOMIB CHEMO SQ INJECTION 3.5 MG (2.5MG/ML)
1.5000 mg/m2 | Freq: Once | INTRAMUSCULAR | Status: AC
  Administered 2024-06-08: 2.25 mg via SUBCUTANEOUS
  Filled 2024-06-08: qty 0.9

## 2024-06-08 MED ORDER — DIPHENHYDRAMINE HCL 25 MG PO CAPS
25.0000 mg | ORAL_CAPSULE | Freq: Once | ORAL | Status: AC
  Administered 2024-06-08: 25 mg via ORAL
  Filled 2024-06-08: qty 1

## 2024-06-08 NOTE — Patient Instructions (Signed)
 [facility]  Discharge Instructions: Thank you for choosing [facility] to provide your oncology and hematology care.   If you have a lab appointment with the Cancer Center, please go directly to the Cancer Center and check in at the registration area.   Wear comfortable clothing and clothing appropriate for easy access to any Portacath or PICC line.   We strive to give you quality time with your provider. You may need to reschedule your appointment if you arrive late (15 or more minutes).  Arriving late affects you and other patients whose appointments are after yours.  Also, if you miss three or more appointments without notifying the office, you may be dismissed from the clinic at the provider's discretion.      For prescription refill requests, have your pharmacy contact our office and allow 72 hours for refills to be completed.    Today you received the following chemotherapy and/or immunotherapy agents velcade, darzalex faspro      To help prevent nausea and vomiting after your treatment, we encourage you to take your nausea medication as directed.  BELOW ARE SYMPTOMS THAT SHOULD BE REPORTED IMMEDIATELY: *FEVER GREATER THAN 100.4 F (38 C) OR HIGHER *CHILLS OR SWEATING *NAUSEA AND VOMITING THAT IS NOT CONTROLLED WITH YOUR NAUSEA MEDICATION *UNUSUAL SHORTNESS OF BREATH *UNUSUAL BRUISING OR BLEEDING *URINARY PROBLEMS (pain or burning when urinating, or frequent urination) *BOWEL PROBLEMS (unusual diarrhea, constipation, pain near the anus) TENDERNESS IN MOUTH AND THROAT WITH OR WITHOUT PRESENCE OF ULCERS (sore throat, sores in mouth, or a toothache) UNUSUAL RASH, SWELLING OR PAIN  UNUSUAL VAGINAL DISCHARGE OR ITCHING   Items with * indicate a potential emergency and should be followed up as soon as possible or go to the Emergency Department if any problems should occur.  Please show the CHEMOTHERAPY ALERT CARD or  IMMUNOTHERAPY ALERT CARD at check-in to the Emergency Department and triage nurse.  Should you have questions after your visit or need to cancel or reschedule your appointment, please contact [facility] - A [facility]  Dept: [phone]  and follow the prompts.  Office hours are 8:00 a.m. to 4:30 p.m. Monday - Friday. Please note that voicemails left after 4:00 p.m. may not be returned until the following business day.  We are closed weekends and major holidays. You have access to a nurse at all times for urgent questions. Please call the main number to the clinic Dept: [phone] and follow the prompts.   For any non-urgent questions, you may also contact your provider using MyChart. We now offer e-Visits for anyone 56 and older to request care online for non-urgent symptoms. For details visit [email or web address].   Also download the MyChart app! Go to the app store, search "MyChart", open the app, select [place], and log in with your MyChart username and password.

## 2024-06-22 ENCOUNTER — Inpatient Hospital Stay

## 2024-06-22 ENCOUNTER — Inpatient Hospital Stay: Attending: Hematology

## 2024-06-22 ENCOUNTER — Inpatient Hospital Stay (HOSPITAL_BASED_OUTPATIENT_CLINIC_OR_DEPARTMENT_OTHER): Admitting: Hematology

## 2024-06-22 VITALS — BP 152/71 | HR 71 | Temp 97.8°F | Resp 18 | Wt 123.5 lb

## 2024-06-22 DIAGNOSIS — Z79899 Other long term (current) drug therapy: Secondary | ICD-10-CM | POA: Diagnosis not present

## 2024-06-22 DIAGNOSIS — Z5112 Encounter for antineoplastic immunotherapy: Secondary | ICD-10-CM | POA: Insufficient documentation

## 2024-06-22 DIAGNOSIS — C9000 Multiple myeloma not having achieved remission: Secondary | ICD-10-CM

## 2024-06-22 DIAGNOSIS — Z79620 Long term (current) use of immunosuppressive biologic: Secondary | ICD-10-CM | POA: Insufficient documentation

## 2024-06-22 DIAGNOSIS — Z5111 Encounter for antineoplastic chemotherapy: Secondary | ICD-10-CM | POA: Diagnosis not present

## 2024-06-22 LAB — CMP (CANCER CENTER ONLY)
ALT: 29 U/L (ref 0–44)
AST: 20 U/L (ref 15–41)
Albumin: 4 g/dL (ref 3.5–5.0)
Alkaline Phosphatase: 48 U/L (ref 38–126)
Anion gap: 7 (ref 5–15)
BUN: 51 mg/dL — ABNORMAL HIGH (ref 8–23)
CO2: 28 mmol/L (ref 22–32)
Calcium: 9.3 mg/dL (ref 8.9–10.3)
Chloride: 104 mmol/L (ref 98–111)
Creatinine: 1.1 mg/dL — ABNORMAL HIGH (ref 0.44–1.00)
GFR, Estimated: 52 mL/min — ABNORMAL LOW (ref >60)
Glucose, Bld: 128 mg/dL — ABNORMAL HIGH (ref 70–99)
Potassium: 3.9 mmol/L (ref 3.5–5.1)
Sodium: 139 mmol/L (ref 135–145)
Total Bilirubin: 0.4 mg/dL (ref 0.0–1.2)
Total Protein: 6.4 g/dL — ABNORMAL LOW (ref 6.5–8.1)

## 2024-06-22 LAB — CBC WITH DIFFERENTIAL (CANCER CENTER ONLY)
Abs Immature Granulocytes: 0.01 10*3/uL (ref 0.00–0.07)
Basophils Absolute: 0 10*3/uL (ref 0.0–0.1)
Basophils Relative: 0 %
Eosinophils Absolute: 0.1 10*3/uL (ref 0.0–0.5)
Eosinophils Relative: 1 %
HCT: 33.8 % — ABNORMAL LOW (ref 36.0–46.0)
Hemoglobin: 11.5 g/dL — ABNORMAL LOW (ref 12.0–15.0)
Immature Granulocytes: 0 %
Lymphocytes Relative: 18 %
Lymphs Abs: 1.4 10*3/uL (ref 0.7–4.0)
MCH: 31.5 pg (ref 26.0–34.0)
MCHC: 34 g/dL (ref 30.0–36.0)
MCV: 92.6 fL (ref 80.0–100.0)
Monocytes Absolute: 0.6 10*3/uL (ref 0.1–1.0)
Monocytes Relative: 8 %
Neutro Abs: 5.6 10*3/uL (ref 1.7–7.7)
Neutrophils Relative %: 73 %
Platelet Count: 200 10*3/uL (ref 150–400)
RBC: 3.65 MIL/uL — ABNORMAL LOW (ref 3.87–5.11)
RDW: 13.1 % (ref 11.5–15.5)
WBC Count: 7.7 10*3/uL (ref 4.0–10.5)
nRBC: 0 % (ref 0.0–0.2)

## 2024-06-22 MED ORDER — DARATUMUMAB-HYALURONIDASE-FIHJ 1800-30000 MG-UT/15ML ~~LOC~~ SOLN
1800.0000 mg | Freq: Once | SUBCUTANEOUS | Status: AC
  Administered 2024-06-22: 1800 mg via SUBCUTANEOUS
  Filled 2024-06-22: qty 15

## 2024-06-22 MED ORDER — DIPHENHYDRAMINE HCL 25 MG PO CAPS
25.0000 mg | ORAL_CAPSULE | Freq: Once | ORAL | Status: AC
  Administered 2024-06-22: 25 mg via ORAL
  Filled 2024-06-22: qty 1

## 2024-06-22 MED ORDER — BORTEZOMIB CHEMO SQ INJECTION 3.5 MG (2.5MG/ML)
1.5000 mg/m2 | Freq: Once | INTRAMUSCULAR | Status: AC
  Administered 2024-06-22: 2.25 mg via SUBCUTANEOUS
  Filled 2024-06-22: qty 0.9

## 2024-06-22 MED ORDER — ACETAMINOPHEN 325 MG PO TABS
650.0000 mg | ORAL_TABLET | Freq: Once | ORAL | Status: AC
  Administered 2024-06-22: 650 mg via ORAL
  Filled 2024-06-22: qty 2

## 2024-06-22 MED ORDER — DEXAMETHASONE 4 MG PO TABS
20.0000 mg | ORAL_TABLET | Freq: Once | ORAL | Status: AC
  Administered 2024-06-22: 20 mg via ORAL
  Filled 2024-06-22: qty 5

## 2024-06-22 NOTE — Progress Notes (Signed)
 HEMATOLOGY/ONCOLOGY CLINIC NOTE  Date of Service: 06/22/24  Patient Care Team: Antonio Meth, Jamee SAUNDERS, DO as PCP - General Delford Maude BROCKS, MD as PCP - Cardiology (Cardiology) Rosan Credit, MD as Consulting Physician (Ophthalmology) Obie Princella HERO, MD (Inactive) (Gastroenterology) Vernetta Lonni GRADE, MD as Consulting Physician (Orthopedic Surgery) Ivin Kocher, MD as Referring Physician (Dermatology) Georgia Blonder, MD as Consulting Physician (Obstetrics and Gynecology) Onesimo Emaline Brink, MD as Consulting Physician (Hematology)  CHIEF COMPLAINTS/PURPOSE OF CONSULTATION:  Evaluation and management of recently diagnosed Multiple myeloma   HISTORY OF PRESENTING ILLNESS:   Cynthia Burns is a wonderful 78 y.o. female who was scheduled to see Dr. Andriette as a new patient and was referred to the ED for evaluation and management of newly diagnosed myeloma with rapidly worsening hgb and renal insuffiencey with proteinuria. High suspicious of Multiple myeloma.    Outside labs done at Dr Ephriam Singh's office (available in referral under media) show M spike of 6.1g/dl and Kappa light chains in the 400's.   Patient is accompanied by her husband and her daughter at bed side during the visit. Patient notes she has been having significantly more fatigue. Has required PRBC transfusions for symptomatic anemia.   She complains of fatigue, right ankle pain, and unexpected weight loss of around 25-30 lbs due to appetite loss in the past year. Her daughter notes that the patient has been confused more often as well.    She denies any new infection issues, fever, chills, back pain, chest pain, abdominal pain, or leg swelling.    Patient notes she tested positive for COVID-19 infection in August 2022 and was prescribed Paxlovid . However, she did not tolerate the medication due to allergic reaction.   PmHx of hypertension. She has been taking Losartan  and hydralazine .    Surgery history: Hysterectomy and Cholecystectomy.    She was started on dexamethasone  20 mg daily x 4 doses yesterday. She has been tolerating the high-dose steroid well.   INTERVAL HISTORY:  Cynthia Burns is a 78 y.o. female here for evaluation and management of newly diagnosed myeloma. She is here for toxicity check prior to cycle 5 day 1 of her treatment.   Patient was last seen by me on 05/25/2024 and reported ankle/knee swelling from steroids which had resolved, blurry vision with elevated blood pressures, and 10-pound weight gain.   Pt is accompanied by her husband and her daughter is also on call. Patient reports no new concerns since her last clinical visit.   She denies new/different spine pain, abdominal pain, constipation, tingling/numbness in the hands/feet, or leg swelling.   Patient notes that she generally drinks 2-4 ensures daily and drinks at least 3L of water daily.   Patient notes weighing 120 pounds at home. She notes previous weight of 135 pounds.   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   HSV-1 infection    HYPERTENSION    Hypothyroid    MITRAL VALVE PROLAPSE    OSTEOPENIA    Overweight(278.02)    PHARYNGITIS, ACUTE     SURGICAL HISTORY: Past Surgical History:  Procedure Laterality Date   ABDOMINAL  HYSTERECTOMY     CHOLECYSTECTOMY     prolped bladder     WISDOM TOOTH EXTRACTION      SOCIAL HISTORY: Social History   Socioeconomic History   Marital status: Married    Spouse name: Not on file   Number of children: Not on file   Years of education: Not on file   Highest education level: Associate degree: occupational, Scientist, product/process development, or vocational program  Occupational  History   Occupation: hairdresser  Tobacco Use   Smoking status: Never   Smokeless tobacco: Never  Vaping Use   Vaping status: Never Used  Substance and Sexual Activity   Alcohol use: No    Alcohol/week: 0.0 standard drinks of alcohol   Drug use: No   Sexual activity: Not Currently    Partners: Male    Birth control/protection: Surgical, Abstinence    Comment: hysterectomy, 17, less than 5  Other Topics Concern   Not on file  Social History Narrative   Not on file   Social Drivers of Health   Financial Resource Strain: Low Risk  (12/07/2023)   Overall Financial Resource Strain (CARDIA)    Difficulty of Paying Living Expenses: Not hard at all  Food Insecurity: Patient Declined (02/29/2024)   Hunger Vital Sign    Worried About Running Out of Food in the Last Year: Patient declined    Ran Out of Food in the Last Year: Patient declined  Transportation Needs: No Transportation Needs (02/29/2024)   PRAPARE - Administrator, Civil Service (Medical): No    Lack of Transportation (Non-Medical): No  Physical Activity: Insufficiently Active (12/07/2023)   Exercise Vital Sign    Days of Exercise per Week: 1 day    Minutes of Exercise per Session: 10 min  Stress: No Stress Concern Present (12/07/2023)   [employer] of Occupational Health - Occupational Stress Questionnaire    Feeling of Stress : Only a little  Social Connections: Socially Integrated (02/29/2024)   Social Connection and Isolation Panel    Frequency of Communication with Friends and Family: More than three times a week    Frequency of Social Gatherings with Friends and Family: More than three times a week    Attends Religious Services: More than 4 times per year    Active Member of [employer] or Organizations: Yes    Attends Engineer, structural: More than 4 times per year    Marital Status: Married  Catering manager Violence: Not At Risk (02/29/2024)   Humiliation, Afraid, Rape, and Kick  questionnaire    Fear of Current or Ex-Partner: No    Emotionally Abused: No    Physically Abused: No    Sexually Abused: No    FAMILY HISTORY: Family History  Problem Relation Age of Onset   Hypertension Mother    Heart disease Mother        Had a stent   Emphysema Father    Diabetes Father    COPD Father    Stroke Sister    Breast cancer Neg Hx     ALLERGIES:  is allergic to mucinex [guaifenesin er], bystolic  [nebivolol  hcl], calcium -containing compounds, codeine, paxlovid  [nirmatrelvir -ritonavir ], advicor [niacin-lovastatin er], amlodipine, lisinopril-hydrochlorothiazide, other, ramipril, and sulfonamide derivatives.  MEDICATIONS:  Current Outpatient Medications  Medication Sig Dispense Refill   acyclovir  (ZOVIRAX ) 400 MG tablet Take 1 tablet (400 mg total) by mouth 2 (two) times daily. 60 tablet 5   amoxicillin  (AMOXIL ) 500 MG capsule Take 2,000 mg by mouth See admin  instructions. Take 2,000 mg by mouth one hour prior to dental visits     Biotin 5000 MCG CAPS Take 5,000 mcg by mouth daily.     Blood Glucose Monitoring Suppl (ONE TOUCH ULTRA 2) w/Device KIT CHECK BLOOD SUGAR TWICE DAILY.  DX CODE  E11.9 1 kit 0   Cholecalciferol (VITAMIN D3) 1000 units CAPS Take 1,000 Units by mouth daily.     clonazePAM  (KLONOPIN ) 0.5 MG tablet Take 1 tablet (0.5 mg total) by mouth 2 (two) times daily as needed for anxiety. 30 tablet 0   Cyanocobalamin  (VITAMIN B-12) 3000 MCG SUBL Place 3,000 mcg under the tongue daily in the afternoon.     dexamethasone  (DECADRON ) 4 MG tablet Take 5 tablets (20 mg) by mouth with breakfast the day after every daratumumab  dose 30 tablet 1   fexofenadine (ALLEGRA) 180 MG tablet Take 180 mg by mouth daily.     fexofenadine-pseudoephedrine (ALLEGRA-D 24) 180-240 MG 24 hr tablet Take 1 tablet by mouth daily.     furosemide  (LASIX ) 20 MG tablet TAKE 1 TABLET BY MOUTH EVERY DAY 90 tablet 1   glucose blood (ONETOUCH ULTRA TEST) test strip Use as instructed 200 each 3    [Paused] hydrALAZINE  (APRESOLINE ) 25 MG tablet Take 25 mg by mouth See admin instructions. Take 25 mg by mouth at 8 AM, 3 PM, and 11 PM- in conjunction with one 50 mg tablet to equal a total dose of 75 mg     insulin  aspart (NOVOLOG  FLEXPEN) 100 UNIT/ML FlexPen Per sliding scale max 40 u daily 15 mL 3   Insulin  Pen Needle (B-D UF III MINI PEN NEEDLES) 31G X 5 MM MISC To use w/ Novolog  100 each 2   Lancets (ONETOUCH DELICA PLUS LANCET33G) MISC USE 1 LANCET TO TEST AS DIRECTED (DISCARD LANCET IN SHARPS CONTAINER IMMEDIATELY AFTER USE) 200 each 1   levothyroxine  (SYNTHROID ) 25 MCG tablet TAKE ONE TABLET BY MOUTH EVERY DAY BEFORE BREAKFAST 90 tablet 1   Multiple Vitamins-Minerals (PRESERVISION AREDS 2) CAPS Take 1 capsule by mouth 2 (two) times daily with a meal.     ondansetron  (ZOFRAN ) 8 MG tablet Take 8 mg by mouth 30 to 60 min prior to Cyclophosphamide administration then take 8 mg every 8 hrs as needed for nausea and vomiting. 30 tablet 1   pantoprazole  (PROTONIX ) 40 MG tablet Pantoprazole  40 mg before breakfast and dinner for 90 days with 3 refills (Patient taking differently: Take 40 mg by mouth See admin instructions. Take 40 mg by mouth 30 minutes before lunch and supper) 180 tablet 3   prochlorperazine  (COMPAZINE ) 10 MG tablet Take 1 tablet (10 mg total) by mouth every 6 (six) hours as needed for nausea or vomiting. 30 tablet 1   rosuvastatin  (CRESTOR ) 20 MG tablet TAKE ONE TABLET BY MOUTH EVERY DAY 90 tablet 3   RYBELSUS  7 MG TABS TAKE ONE TABLET BY MOUTH EVERY DAY - (TAKE WITH NO MORE THAN 4 OUNCES OF PLAIN WATER AT LEAST 30 MINUTES BEFORE THE FIRST FOOD, BEVERAGE, OR OTHER MEDICATIONS OF THE DAY) 90 tablet 0   SYSTANE ULTRA PF 0.4-0.3 % SOLN Place 1 drop into both eyes See admin instructions. Instill 1 drop into both eyes one to four times a day     No current facility-administered medications for this visit.    REVIEW OF SYSTEMS:    10 Point review of Systems was done is negative  except as noted above.  PHYSICAL EXAMINATION:  ECOG PERFORMANCE STATUS: 2 -  Symptomatic, <50% confined to bed VSS  GENERAL:alert, in no acute distress and comfortable SKIN: no acute rashes, no significant lesions EYES: conjunctiva are pink and non-injected, sclera anicteric OROPHARYNX: MMM, no exudates, no oropharyngeal erythema or ulceration NECK: supple, no JVD LYMPH:  no palpable lymphadenopathy in the cervical, axillary or inguinal regions LUNGS: clear to auscultation b/l with normal respiratory effort HEART: regular rate & rhythm ABDOMEN:  normoactive bowel sounds , non tender, not distended. Extremity: no pedal edema PSYCH: alert & oriented x 3 with fluent speech NEURO: no focal motor/sensory deficits   LABORATORY DATA:  I have reviewed the data as listed  .    Latest Ref Rng & Units 06/22/2024    7:58 AM 06/08/2024   10:59 AM 06/01/2024    8:20 AM  CBC  WBC 4.0 - 10.5 K/uL 7.7  9.5  6.5   Hemoglobin 12.0 - 15.0 g/dL 88.4  87.7  88.5   Hematocrit 36.0 - 46.0 % 33.8  36.9  34.1   Platelets 150 - 400 K/uL 200  225  221        Latest Ref Rng & Units 06/08/2024   10:59 AM 06/01/2024    8:20 AM 05/25/2024    9:01 AM  CMP  Glucose 70 - 99 mg/dL 854  873  870   BUN 8 - 23 mg/dL 54  58  66   Creatinine 0.44 - 1.00 mg/dL 8.72  8.81  8.90   Sodium 135 - 145 mmol/L 139  139  138   Potassium 3.5 - 5.1 mmol/L 4.1  3.7  3.9   Chloride 98 - 111 mmol/L 105  105  107   CO2 22 - 32 mmol/L 28  27  25    Calcium  8.9 - 10.3 mg/dL 9.5  9.2  9.2   Total Protein 6.5 - 8.1 g/dL 7.0  6.3  6.5   Total Bilirubin 0.0 - 1.2 mg/dL 0.4  0.3  0.3   Alkaline Phos 38 - 126 U/L 54  53  50   AST 15 - 41 U/L 27  24  23    ALT 0 - 44 U/L 78  50  29    Bone marrow biopsy 02/01/2024:    Surgical pathology 01/29/2024:    RADIOGRAPHIC STUDIES: I have personally reviewed the radiological images as listed and agreed with the findings in the report. No results found.   ASSESSMENT & PLAN:  78 y.o.  female with:  Newly diagnosed Multiple myeloma - Lambda restricted plasma cell neoplasm comprising greater than 75% of  the cellular marrow 2. Acute on CKD 3. HTN 4. DM2 5. Hypothyroidism  Patient Active Problem List   Diagnosis Date Noted   Symptomatic anemia 01/29/2024   Multiple myeloma (HCC) 01/29/2024   Acute renal failure superimposed on stage 3b chronic kidney disease (HCC) 01/29/2024   Anemia 11/26/2023   Hypothyroidism 02/23/2023   Type 2 diabetes mellitus with hyperglycemia, without long-term current use of insulin  (HCC) 07/26/2021   Impacted cerumen of right ear 07/06/2020   Type 2 diabetes mellitus with hyperglycemia (HCC) 04/11/2019   Hyperlipidemia LDL goal <70 03/23/2017   Abdominal pain, epigastric 09/03/2016   Strain of right biceps 03/17/2016   Right shoulder pain 03/17/2016   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   Plantar wart of right foot 03/15/2015   Uncontrolled type 2 diabetes mellitus with hyperglycemia (HCC) 09/16/2013   Elevated lipids 09/05/2013   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   OVERWEIGHT 02/07/2009   PHARYNGITIS, ACUTE 06/03/2007  Essential hypertension 05/18/2007   MITRAL VALVE PROLAPSE 05/18/2007   OSTEOPENIA 05/18/2007   6. Hyponatremia (from 02/23/2023) chronic. Recent thyroid  function tests WNL. 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  PLAN:  -Discussed lab results on 06/22/24 in detail with patient. CBC stable, showed WBC of 7.7K, hemoglobin of 11.5, and platelets of 200K. -CMP stable -her creatinine is at baseline at 1.1 -discussed that her protein has come down by greater than 90% after 3 cycles, from 6.2 to 0.6 g/dL -myeloma lab from today shows M spike of 0.5g/dl -Patient has been tolerating her treatment well without any new or severe toxicities.  -patient is appropriate to proceed with cycle 5 of treatment at this time -will  cut back post-treatment steroids from 2 tablets to 1 tablet, then eventually stop with the following treatment -discussed that there is a role to monitor for neuropathy with Velcade  -discussed that we may consider Revlimid as an alterative to velcade   -continue vitamin B complex regularly -recommend continuing reasonable infection precautions, especially during the flu season -recommend staying UTD with age-appropriate vaccines -recommend generally minimizing ultra processed foods -continue to stay well-hydrated -continue to eat as well as she can -continue to stay physically active -discussed that there would be the option of bone marrow transplant given she has had 90% reduction in M protein with standard treatment with goal of achieving deeper response that may last longer, though bone marrow transplant would be an involved process and would be a personal decision -pt is not inclined to proceed with referral to discuss consideration of bone marrow transplant at this time.  -will plan for at least 5-6 cycles of treatment with goal to drop her M protein as much as possible -discussed that after 5-6 cycles, we may consider revlimid maintenance or combination maintenance treatment, and this would require a discussion down the line -answered all of patient's and her family member's questions in detail  FOLLOW-UP: Per integrated scheduling MD visit in 3-4 weeks  The total time spent in the appointment was 32 minutes* .  All of the patient's questions were answered with apparent satisfaction. The patient knows to call the clinic with any problems, questions or concerns.   Emaline Saran MD MS AAHIVMS [facility] Hematology/Oncology Physician [facility]  .*Total Encounter Time as defined by the Centers for Medicare and Medicaid Services includes, in addition to the face-to-face time of a patient visit (documented in the note above) non-face-to-face time: obtaining and reviewing  outside history, ordering and reviewing medications, tests or procedures, care coordination (communications with other health care professionals or caregivers) and documentation in the medical record.    I,Mitra Faeizi,acting as a Neurosurgeon for Emaline Saran, MD.,have documented all relevant documentation on the behalf of Emaline Saran, MD,as directed by  Emaline Saran, MD while in the presence of Emaline Saran, MD.  .I have reviewed the above documentation for accuracy and completeness, and I agree with the above. .Kendre Sires Kishore Noga Fogg MD

## 2024-06-22 NOTE — Patient Instructions (Signed)
 [facility]  Discharge Instructions: Thank you for choosing [facility] to provide your oncology and hematology care.   If you have a lab appointment with the Cancer Center, please go directly to the Cancer Center and check in at the registration area.   Wear comfortable clothing and clothing appropriate for easy access to any Portacath or PICC line.   We strive to give you quality time with your provider. You may need to reschedule your appointment if you arrive late (15 or more minutes).  Arriving late affects you and other patients whose appointments are after yours.  Also, if you miss three or more appointments without notifying the office, you may be dismissed from the clinic at the provider's discretion.      For prescription refill requests, have your pharmacy contact our office and allow 72 hours for refills to be completed.    Today you received the following chemotherapy and/or immunotherapy agents: Velcade/Darzalex Faspro      To help prevent nausea and vomiting after your treatment, we encourage you to take your nausea medication as directed.  BELOW ARE SYMPTOMS THAT SHOULD BE REPORTED IMMEDIATELY: *FEVER GREATER THAN 100.4 F (38 C) OR HIGHER *CHILLS OR SWEATING *NAUSEA AND VOMITING THAT IS NOT CONTROLLED WITH YOUR NAUSEA MEDICATION *UNUSUAL SHORTNESS OF BREATH *UNUSUAL BRUISING OR BLEEDING *URINARY PROBLEMS (pain or burning when urinating, or frequent urination) *BOWEL PROBLEMS (unusual diarrhea, constipation, pain near the anus) TENDERNESS IN MOUTH AND THROAT WITH OR WITHOUT PRESENCE OF ULCERS (sore throat, sores in mouth, or a toothache) UNUSUAL RASH, SWELLING OR PAIN  UNUSUAL VAGINAL DISCHARGE OR ITCHING   Items with * indicate a potential emergency and should be followed up as soon as possible or go to the Emergency Department if any problems should occur.  Please show the CHEMOTHERAPY ALERT CARD or  IMMUNOTHERAPY ALERT CARD at check-in to the Emergency Department and triage nurse.  Should you have questions after your visit or need to cancel or reschedule your appointment, please contact [facility] - A DEPT OF [facility]  Dept: [phone]  and follow the prompts.  Office hours are 8:00 a.m. to 4:30 p.m. Monday - Friday. Please note that voicemails left after 4:00 p.m. may not be returned until the following business day.  We are closed weekends and major holidays. You have access to a nurse at all times for urgent questions. Please call the main number to the clinic Dept: [phone] and follow the prompts.   For any non-urgent questions, you may also contact your provider using MyChart. We now offer e-Visits for anyone 25 and older to request care online for non-urgent symptoms. For details visit [email or web address].   Also download the MyChart app! Go to the app store, search "MyChart", open the app, select [place], and log in with your MyChart username and password.

## 2024-06-23 LAB — KAPPA/LAMBDA LIGHT CHAINS
Kappa free light chain: 3.8 mg/L (ref 3.3–19.4)
Kappa, lambda light chain ratio: 0.58 (ref 0.26–1.65)
Lambda free light chains: 6.5 mg/L (ref 5.7–26.3)

## 2024-06-26 LAB — MULTIPLE MYELOMA PANEL, SERUM
Albumin SerPl Elph-Mcnc: 3.9 g/dL (ref 2.9–4.4)
Albumin/Glob SerPl: 1.7 (ref 0.7–1.7)
Alpha 1: 0.2 g/dL (ref 0.0–0.4)
Alpha2 Glob SerPl Elph-Mcnc: 0.6 g/dL (ref 0.4–1.0)
B-Globulin SerPl Elph-Mcnc: 0.8 g/dL (ref 0.7–1.3)
Gamma Glob SerPl Elph-Mcnc: 0.7 g/dL (ref 0.4–1.8)
Globulin, Total: 2.3 g/dL (ref 2.2–3.9)
IgA: <5 mg/dL — ABNORMAL LOW (ref 64–422)
IgG (Immunoglobin G), Serum: 829 mg/dL (ref 586–1602)
IgM (Immunoglobulin M), Srm: 17 mg/dL — ABNORMAL LOW (ref 26–217)
M Protein SerPl Elph-Mcnc: 0.5 g/dL — ABNORMAL HIGH
Total Protein ELP: 6.2 g/dL (ref 6.0–8.5)

## 2024-06-27 ENCOUNTER — Encounter: Payer: Self-pay | Admitting: Physician Assistant

## 2024-06-28 ENCOUNTER — Other Ambulatory Visit: Payer: Self-pay

## 2024-06-29 ENCOUNTER — Other Ambulatory Visit

## 2024-06-29 ENCOUNTER — Inpatient Hospital Stay

## 2024-06-29 ENCOUNTER — Ambulatory Visit: Admitting: Physician Assistant

## 2024-06-29 VITALS — BP 165/73 | HR 70 | Temp 97.8°F | Resp 16 | Wt 122.0 lb

## 2024-06-29 DIAGNOSIS — Z79620 Long term (current) use of immunosuppressive biologic: Secondary | ICD-10-CM | POA: Diagnosis not present

## 2024-06-29 DIAGNOSIS — C9000 Multiple myeloma not having achieved remission: Secondary | ICD-10-CM | POA: Diagnosis not present

## 2024-06-29 DIAGNOSIS — Z79899 Other long term (current) drug therapy: Secondary | ICD-10-CM | POA: Diagnosis not present

## 2024-06-29 DIAGNOSIS — Z5112 Encounter for antineoplastic immunotherapy: Secondary | ICD-10-CM | POA: Diagnosis not present

## 2024-06-29 LAB — CMP (CANCER CENTER ONLY)
ALT: 24 U/L (ref 0–44)
AST: 19 U/L (ref 15–41)
Albumin: 4 g/dL (ref 3.5–5.0)
Alkaline Phosphatase: 51 U/L (ref 38–126)
Anion gap: 6 (ref 5–15)
BUN: 52 mg/dL — ABNORMAL HIGH (ref 8–23)
CO2: 27 mmol/L (ref 22–32)
Calcium: 9.3 mg/dL (ref 8.9–10.3)
Chloride: 104 mmol/L (ref 98–111)
Creatinine: 1.3 mg/dL — ABNORMAL HIGH (ref 0.44–1.00)
GFR, Estimated: 43 mL/min — ABNORMAL LOW (ref >60)
Glucose, Bld: 138 mg/dL — ABNORMAL HIGH (ref 70–99)
Potassium: 4.1 mmol/L (ref 3.5–5.1)
Sodium: 137 mmol/L (ref 135–145)
Total Bilirubin: 0.3 mg/dL (ref 0.0–1.2)
Total Protein: 6.3 g/dL — ABNORMAL LOW (ref 6.5–8.1)

## 2024-06-29 LAB — CBC WITH DIFFERENTIAL (CANCER CENTER ONLY)
Abs Immature Granulocytes: 0.01 K/uL (ref 0.00–0.07)
Basophils Absolute: 0 K/uL (ref 0.0–0.1)
Basophils Relative: 0 %
Eosinophils Absolute: 0.1 K/uL (ref 0.0–0.5)
Eosinophils Relative: 1 %
HCT: 35.4 % — ABNORMAL LOW (ref 36.0–46.0)
Hemoglobin: 11.9 g/dL — ABNORMAL LOW (ref 12.0–15.0)
Immature Granulocytes: 0 %
Lymphocytes Relative: 24 %
Lymphs Abs: 1.4 K/uL (ref 0.7–4.0)
MCH: 31.6 pg (ref 26.0–34.0)
MCHC: 33.6 g/dL (ref 30.0–36.0)
MCV: 94.1 fL (ref 80.0–100.0)
Monocytes Absolute: 0.6 K/uL (ref 0.1–1.0)
Monocytes Relative: 10 %
Neutro Abs: 3.7 K/uL (ref 1.7–7.7)
Neutrophils Relative %: 65 %
Platelet Count: 192 K/uL (ref 150–400)
RBC: 3.76 MIL/uL — ABNORMAL LOW (ref 3.87–5.11)
RDW: 13 % (ref 11.5–15.5)
WBC Count: 5.8 K/uL (ref 4.0–10.5)
nRBC: 0 % (ref 0.0–0.2)

## 2024-06-29 MED ORDER — BORTEZOMIB CHEMO SQ INJECTION 3.5 MG (2.5MG/ML)
1.5000 mg/m2 | Freq: Once | INTRAMUSCULAR | Status: AC
  Administered 2024-06-29: 2.25 mg via SUBCUTANEOUS
  Filled 2024-06-29: qty 0.9

## 2024-06-29 MED ORDER — DEXAMETHASONE 4 MG PO TABS
20.0000 mg | ORAL_TABLET | Freq: Once | ORAL | Status: AC
  Administered 2024-06-29: 20 mg via ORAL
  Filled 2024-06-29: qty 5

## 2024-06-29 NOTE — Patient Instructions (Signed)
 [facility] - A DEPT OF [facility]  Discharge Instructions: Thank you for choosing [facility] to provide your oncology and hematology care.   If you have a lab appointment with the Cancer Center, please go directly to the Cancer Center and check in at the registration area.   Wear comfortable clothing and clothing appropriate for easy access to any Portacath or PICC line.   We strive to give you quality time with your provider. You may need to reschedule your appointment if you arrive late (15 or more minutes).  Arriving late affects you and other patients whose appointments are after yours.  Also, if you miss three or more appointments without notifying the office, you may be dismissed from the clinic at the provider's discretion.      For prescription refill requests, have your pharmacy contact our office and allow 72 hours for refills to be completed.    Today you received the following chemotherapy and/or immunotherapy agents: bortezomib      To help prevent nausea and vomiting after your treatment, we encourage you to take your nausea medication as directed.  BELOW ARE SYMPTOMS THAT SHOULD BE REPORTED IMMEDIATELY: *FEVER GREATER THAN 100.4 F (38 C) OR HIGHER *CHILLS OR SWEATING *NAUSEA AND VOMITING THAT IS NOT CONTROLLED WITH YOUR NAUSEA MEDICATION *UNUSUAL SHORTNESS OF BREATH *UNUSUAL BRUISING OR BLEEDING *URINARY PROBLEMS (pain or burning when urinating, or frequent urination) *BOWEL PROBLEMS (unusual diarrhea, constipation, pain near the anus) TENDERNESS IN MOUTH AND THROAT WITH OR WITHOUT PRESENCE OF ULCERS (sore throat, sores in mouth, or a toothache) UNUSUAL RASH, SWELLING OR PAIN  UNUSUAL VAGINAL DISCHARGE OR ITCHING   Items with * indicate a potential emergency and should be followed up as soon as possible or go to the Emergency Department if any problems should occur.  Please show the CHEMOTHERAPY ALERT CARD or IMMUNOTHERAPY  ALERT CARD at check-in to the Emergency Department and triage nurse.  Should you have questions after your visit or need to cancel or reschedule your appointment, please contact [facility] - A DE[facility]  Dept: [phone]  and follow the prompts.  Office hours are 8:00 a.m. to 4:30 p.m. Monday - Friday. Please note that voicemails left after 4:00 p.m. may not be returned until the following business day.  We are closed weekends and major holidays. You have access to a nurse at all times for urgent questions. Please call the main number to the clinic Dept: [phone] and follow the prompts.   For any non-urgent questions, you may also contact your provider using MyChart. We now offer e-Visits for anyone 52 and older to request care online for non-urgent symptoms. For details visit [email or web address].   Also download the MyChart app! Go to the app store, search "MyChart", open the app, select , and log in with your MyChart username and password.

## 2024-07-03 ENCOUNTER — Encounter: Payer: Self-pay | Admitting: Hematology

## 2024-07-06 ENCOUNTER — Inpatient Hospital Stay

## 2024-07-06 VITALS — BP 149/70 | HR 72 | Temp 98.0°F | Resp 18 | Wt 124.5 lb

## 2024-07-06 DIAGNOSIS — C9000 Multiple myeloma not having achieved remission: Secondary | ICD-10-CM

## 2024-07-06 DIAGNOSIS — Z5112 Encounter for antineoplastic immunotherapy: Secondary | ICD-10-CM | POA: Diagnosis not present

## 2024-07-06 DIAGNOSIS — Z79620 Long term (current) use of immunosuppressive biologic: Secondary | ICD-10-CM | POA: Diagnosis not present

## 2024-07-06 DIAGNOSIS — Z79899 Other long term (current) drug therapy: Secondary | ICD-10-CM | POA: Diagnosis not present

## 2024-07-06 LAB — CBC WITH DIFFERENTIAL (CANCER CENTER ONLY)
Abs Immature Granulocytes: 0.02 K/uL (ref 0.00–0.07)
Basophils Absolute: 0 K/uL (ref 0.0–0.1)
Basophils Relative: 0 %
Eosinophils Absolute: 0.1 K/uL (ref 0.0–0.5)
Eosinophils Relative: 1 %
HCT: 35.7 % — ABNORMAL LOW (ref 36.0–46.0)
Hemoglobin: 12.2 g/dL (ref 12.0–15.0)
Immature Granulocytes: 0 %
Lymphocytes Relative: 17 %
Lymphs Abs: 1.6 K/uL (ref 0.7–4.0)
MCH: 31.7 pg (ref 26.0–34.0)
MCHC: 34.2 g/dL (ref 30.0–36.0)
MCV: 92.7 fL (ref 80.0–100.0)
Monocytes Absolute: 0.7 K/uL (ref 0.1–1.0)
Monocytes Relative: 8 %
Neutro Abs: 6.9 K/uL (ref 1.7–7.7)
Neutrophils Relative %: 74 %
Platelet Count: 211 K/uL (ref 150–400)
RBC: 3.85 MIL/uL — ABNORMAL LOW (ref 3.87–5.11)
RDW: 13 % (ref 11.5–15.5)
WBC Count: 9.4 K/uL (ref 4.0–10.5)
nRBC: 0 % (ref 0.0–0.2)

## 2024-07-06 LAB — CMP (CANCER CENTER ONLY)
ALT: 33 U/L (ref 0–44)
AST: 22 U/L (ref 15–41)
Albumin: 3.9 g/dL (ref 3.5–5.0)
Alkaline Phosphatase: 44 U/L (ref 38–126)
Anion gap: 6 (ref 5–15)
BUN: 49 mg/dL — ABNORMAL HIGH (ref 8–23)
CO2: 29 mmol/L (ref 22–32)
Calcium: 9.2 mg/dL (ref 8.9–10.3)
Chloride: 105 mmol/L (ref 98–111)
Creatinine: 1.16 mg/dL — ABNORMAL HIGH (ref 0.44–1.00)
GFR, Estimated: 49 mL/min — ABNORMAL LOW (ref >60)
Glucose, Bld: 101 mg/dL — ABNORMAL HIGH (ref 70–99)
Potassium: 3.9 mmol/L (ref 3.5–5.1)
Sodium: 140 mmol/L (ref 135–145)
Total Bilirubin: 0.3 mg/dL (ref 0.0–1.2)
Total Protein: 6.4 g/dL — ABNORMAL LOW (ref 6.5–8.1)

## 2024-07-06 MED ORDER — DEXAMETHASONE 4 MG PO TABS
20.0000 mg | ORAL_TABLET | Freq: Once | ORAL | Status: AC
  Administered 2024-07-06: 20 mg via ORAL
  Filled 2024-07-06: qty 5

## 2024-07-06 MED ORDER — DARATUMUMAB-HYALURONIDASE-FIHJ 1800-30000 MG-UT/15ML ~~LOC~~ SOLN
1800.0000 mg | Freq: Once | SUBCUTANEOUS | Status: AC
  Administered 2024-07-06: 1800 mg via SUBCUTANEOUS
  Filled 2024-07-06: qty 15

## 2024-07-06 MED ORDER — DIPHENHYDRAMINE HCL 25 MG PO CAPS
25.0000 mg | ORAL_CAPSULE | Freq: Once | ORAL | Status: AC
  Administered 2024-07-06: 25 mg via ORAL
  Filled 2024-07-06: qty 1

## 2024-07-06 MED ORDER — ACETAMINOPHEN 325 MG PO TABS
650.0000 mg | ORAL_TABLET | Freq: Once | ORAL | Status: AC
Start: 2024-07-06 — End: 2024-07-06
  Administered 2024-07-06: 650 mg via ORAL
  Filled 2024-07-06: qty 2

## 2024-07-06 MED ORDER — BORTEZOMIB CHEMO SQ INJECTION 3.5 MG (2.5MG/ML)
1.5000 mg/m2 | Freq: Once | INTRAMUSCULAR | Status: AC
  Administered 2024-07-06: 2.25 mg via SUBCUTANEOUS
  Filled 2024-07-06: qty 0.9

## 2024-07-15 ENCOUNTER — Other Ambulatory Visit: Payer: Self-pay | Admitting: Hematology

## 2024-07-15 ENCOUNTER — Other Ambulatory Visit: Payer: Self-pay | Admitting: Family Medicine

## 2024-07-15 DIAGNOSIS — E039 Hypothyroidism, unspecified: Secondary | ICD-10-CM

## 2024-07-15 DIAGNOSIS — E1165 Type 2 diabetes mellitus with hyperglycemia: Secondary | ICD-10-CM

## 2024-07-15 DIAGNOSIS — C9000 Multiple myeloma not having achieved remission: Secondary | ICD-10-CM

## 2024-07-15 NOTE — Telephone Encounter (Signed)
Fill too soon

## 2024-07-16 ENCOUNTER — Other Ambulatory Visit: Payer: Self-pay

## 2024-07-19 NOTE — Progress Notes (Signed)
 HEMATOLOGY/ONCOLOGY CLINIC NOTE  Date of Service: 07/20/2024  Patient Care Team: Cynthia Burns, Cynthia SAUNDERS, DO as PCP - General Cynthia Maude BROCKS, MD as PCP - Cardiology (Cardiology) Cynthia Credit, MD as Consulting Physician (Ophthalmology) Cynthia Princella HERO, MD (Inactive) (Gastroenterology) Cynthia Lonni GRADE, MD as Consulting Physician (Orthopedic Surgery) Cynthia Kocher, MD as Referring Physician (Dermatology) Cynthia Blonder, MD as Consulting Physician (Obstetrics and Gynecology) Cynthia Emaline Brink, MD as Consulting Physician (Hematology)  CHIEF COMPLAINTS/PURPOSE OF CONSULTATION:  Evaluation and management of recently diagnosed Multiple myeloma   HISTORY OF PRESENTING ILLNESS:   Cynthia Burns is a wonderful 78 y.o. female who was scheduled to see Dr. Andriette as a new patient and was referred to the ED for evaluation and management of newly diagnosed myeloma with rapidly worsening hgb and renal insuffiencey with proteinuria. High suspicious of Multiple myeloma.    Outside labs done at Dr Ephriam Singh's office (available in referral under media) show M spike of 6.1g/dl and Kappa light chains in the 400's.   Patient is accompanied by her husband and her daughter at bed side during the visit. Patient notes she has been having significantly more fatigue. Has required PRBC transfusions for symptomatic anemia.   She complains of fatigue, right ankle pain, and unexpected weight loss of around 25-30 lbs due to appetite loss in the past year. Her daughter notes that the patient has been confused more often as well.    She denies any new infection issues, fever, chills, back pain, chest pain, abdominal pain, or leg swelling.    Patient notes she tested positive for COVID-19 infection in August 2022 and was prescribed Paxlovid . However, she did not tolerate the medication due to allergic reaction.   PmHx of hypertension. She has been taking Losartan  and hydralazine .    Surgery history: Hysterectomy and Cholecystectomy.    She was started on dexamethasone  20 mg daily x 4 doses yesterday. She has been tolerating the high-dose steroid well.   INTERVAL HISTORY:  Rubylee Burns is a 78 y.o. female here for evaluation and management of myeloma. She is here for toxicity check prior to cycle 6 day 1 of her treatment.   Patient was last seen by me on 06/22/2024 and was doing well overall with no new medical complaints.   She is accompanied by her husband during today's visit. Patient's daughter is also on call  Patient reports that she went to dentist yesterday. She notes that she is does not need a crown on her tooth at this time, but there are plans for a filling. Patient denies any upcoming dental procedures that would require digging into the jawbone. She notes being told that she can have a crown placement procedure at a later time. Patient is noted to be taking amoxicillin  regularly prior to dental visits.    Patient reports that she has been eating well, sleeping well, and has been functioning well at home.   She reports that she weighed 121 pounds at home today, which she notes is an improved from 104 pounds previously. Patient notes that her baseline weight is 125-135 pounds.   She notes having some soreness over her left side around the hip area, and notes that she is unsure if she accidentally hit it against an object around the home. Patient notes that the area is sore to touch only in the skin. She denies any skin rashes in the area. Patient denies any new bone pain. I advised patient to continue to  monitor for any changes.   Patient notes that she is needing a refill for acyclovir .   Patient denies any other new concerns. She denies any infection issues, tingling/numbness in the hands/feet, or leg swelling.   She notes that she took 1 post-treatment steroid last time.   Patient notes that she has hx of aneurysm.   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   HSV-1 infection    HYPERTENSION    Hypothyroid    MITRAL VALVE PROLAPSE    OSTEOPENIA    Overweight(278.02)    PHARYNGITIS, ACUTE     SURGICAL HISTORY: Past Surgical History:  Procedure Laterality Date   ABDOMINAL HYSTERECTOMY     CHOLECYSTECTOMY     prolped bladder     WISDOM TOOTH EXTRACTION      SOCIAL HISTORY: Social History   Socioeconomic History   Marital status: Married    Spouse name: Not on file   Number of children: Not on file   Years of education: Not on file   Highest education level: Associate degree: occupational, Scientist, product/process development, or vocational program  Occupational History   Occupation: hairdresser  Tobacco Use   Smoking status: Never   Smokeless tobacco: Never  Vaping Use   Vaping status: Never Used  Substance and Sexual Activity   Alcohol use: No    Alcohol/week: 0.0 standard drinks of alcohol   Drug use: No   Sexual activity: Not Currently    Partners: Male    Birth control/protection: Surgical, Abstinence    Comment: hysterectomy, 17, less than 5  Other Topics Concern   Not on file  Social History Narrative   Not on file   Social Drivers of Health   Financial Resource Strain: Low Risk  (12/07/2023)   Overall Financial Resource Strain (CARDIA)    Difficulty of Paying Living Expenses: Not hard at all  Food Insecurity: Patient Declined (02/29/2024)   Hunger Vital Sign    Worried About Running Out of Food in the Last Year: Patient declined    Ran Out of Food in the Last Year: Patient declined  Transportation Needs: No Transportation Needs (02/29/2024)   PRAPARE - Administrator, Civil Service (Medical): No    Lack of Transportation  (Non-Medical): No  Physical Activity: Insufficiently Active (12/07/2023)   Exercise Vital Sign    Days of Exercise per Week: 1 day    Minutes of Exercise per Session: 10 min  Stress: No Stress Concern Present (12/07/2023)   [employer] of Occupational Health - Occupational Stress Questionnaire    Feeling of Stress : Only a little  Social Connections: Socially Integrated (02/29/2024)   Social Connection and Isolation Panel    Frequency of Communication with Friends and Family: More than three times a week    Frequency of Social Gatherings with Friends and Family: More than three times a week    Attends Religious Services: More than 4 times per year    Active Member of [employer] or Organizations: Yes    Attends Engineer, structural: More than 4 times per year    Marital Status: Married  Catering manager  Violence: Not At Risk (02/29/2024)   Humiliation, Afraid, Rape, and Kick questionnaire    Fear of Current or Ex-Partner: No    Emotionally Abused: No    Physically Abused: No    Sexually Abused: No    FAMILY HISTORY: Family History  Problem Relation Age of Onset   Hypertension Mother    Heart disease Mother        Had a stent   Emphysema Father    Diabetes Father    COPD Father    Stroke Sister    Breast cancer Neg Hx     ALLERGIES:  is allergic to mucinex [guaifenesin er], bystolic  [nebivolol  hcl], calcium -containing compounds, codeine, paxlovid  [nirmatrelvir -ritonavir ], advicor [niacin-lovastatin er], amlodipine, lisinopril-hydrochlorothiazide, other, ramipril, and sulfonamide derivatives.  MEDICATIONS:  Current Outpatient Medications  Medication Sig Dispense Refill   acyclovir  (ZOVIRAX ) 400 MG tablet Take 1 tablet (400 mg total) by mouth 2 (two) times daily. 60 tablet 5   amoxicillin  (AMOXIL ) 500 MG capsule Take 2,000 mg by mouth See admin instructions. Take 2,000 mg by mouth one hour prior to dental visits (Patient not taking: Reported on 07/20/2024)     Biotin  5000 MCG CAPS Take 5,000 mcg by mouth daily.     Blood Glucose Monitoring Suppl (ONE TOUCH ULTRA 2) w/Device KIT CHECK BLOOD SUGAR TWICE DAILY.  DX CODE  E11.9 1 kit 0   Cholecalciferol (VITAMIN D3) 1000 units CAPS Take 1,000 Units by mouth daily.     clonazePAM  (KLONOPIN ) 0.5 MG tablet Take 1 tablet (0.5 mg total) by mouth 2 (two) times daily as needed for anxiety. 30 tablet 0   Cyanocobalamin  (VITAMIN B-12) 3000 MCG SUBL Place 3,000 mcg under the tongue daily in the afternoon.     dexamethasone  (DECADRON ) 4 MG tablet TAKE FIVE TABLETS BY MOUTH EVERY DAY WITH BREAKFAST THE DAY AFTER EVERY DARATUMUMAB  DOSE 30 tablet 1   fexofenadine (ALLEGRA) 180 MG tablet Take 180 mg by mouth daily.     fexofenadine-pseudoephedrine (ALLEGRA-D 24) 180-240 MG 24 hr tablet Take 1 tablet by mouth daily.     furosemide  (LASIX ) 20 MG tablet TAKE 1 TABLET BY MOUTH EVERY DAY (Patient not taking: Reported on 07/20/2024) 90 tablet 1   glucose blood (ONETOUCH ULTRA TEST) test strip Use as instructed 200 each 3   [Paused] hydrALAZINE  (APRESOLINE ) 25 MG tablet Take 25 mg by mouth See admin instructions. Take 25 mg by mouth at 8 AM, 3 PM, and 11 PM- in conjunction with one 50 mg tablet to equal a total dose of 75 mg     insulin  aspart (NOVOLOG  FLEXPEN) 100 UNIT/ML FlexPen Per sliding scale max 40 u daily 15 mL 3   Insulin  Pen Needle (B-D UF III MINI PEN NEEDLES) 31G X 5 MM MISC To use w/ Novolog  100 each 2   Lancets (ONETOUCH DELICA PLUS LANCET33G) MISC USE 1 LANCET TO TEST AS DIRECTED (DISCARD LANCET IN SHARPS CONTAINER IMMEDIATELY AFTER USE) 200 each 1   levothyroxine  (SYNTHROID ) 25 MCG tablet TAKE ONE TABLET BY MOUTH EVERY DAY BEFORE BREAKFAST 90 tablet 1   Multiple Vitamins-Minerals (PRESERVISION AREDS 2) CAPS Take 1 capsule by mouth 2 (two) times daily with a meal.     ondansetron  (ZOFRAN ) 8 MG tablet Take 8 mg by mouth 30 to 60 min prior to Cyclophosphamide administration then take 8 mg every 8 hrs as needed for nausea and  vomiting. 30 tablet 1   pantoprazole  (PROTONIX ) 40 MG tablet Pantoprazole  40 mg before breakfast and dinner  for 90 days with 3 refills (Patient taking differently: Take 40 mg by mouth See admin instructions. Take 40 mg by mouth 30 minutes before lunch and supper) 180 tablet 3   prochlorperazine  (COMPAZINE ) 10 MG tablet Take 1 tablet (10 mg total) by mouth every 6 (six) hours as needed for nausea or vomiting. 30 tablet 1   rosuvastatin  (CRESTOR ) 20 MG tablet TAKE ONE TABLET BY MOUTH EVERY DAY 90 tablet 3   RYBELSUS  7 MG TABS TAKE ONE TABLET BY MOUTH EVERY DAY - (TAKE WITH NO MORE THAN 4 OUNCES OF PLAIN WATER AT LEAST 30 MINUTES BEFORE THE FIRST FOOD, BEVERAGE, OR OTHER MEDICATIONS OF THE DAY) 90 tablet 0   SYSTANE ULTRA PF 0.4-0.3 % SOLN Place 1 drop into both eyes See admin instructions. Instill 1 drop into both eyes one to four times a day     No current facility-administered medications for this visit.    REVIEW OF SYSTEMS:    10 Point review of Systems was done is negative except as noted above.  PHYSICAL EXAMINATION:  ECOG PERFORMANCE STATUS: 2 - Symptomatic, <50% confined to bed VSS  GENERAL:alert, in no acute distress and comfortable SKIN: no acute rashes, no significant lesions EYES: conjunctiva are pink and non-injected, sclera anicteric OROPHARYNX: MMM, no exudates, no oropharyngeal erythema or ulceration NECK: supple, no JVD LYMPH:  no palpable lymphadenopathy in the cervical, axillary or inguinal regions LUNGS: clear to auscultation b/l with normal respiratory effort HEART: regular rate & rhythm ABDOMEN:  normoactive bowel sounds , non tender, not distended. Extremity: no pedal edema PSYCH: alert & oriented x 3 with fluent speech NEURO: no focal motor/sensory deficits   LABORATORY DATA:  I have reviewed the data as listed  .    Latest Ref Rng & Units 07/20/2024    8:34 AM 07/06/2024    7:44 AM 06/29/2024    8:05 AM  CBC  WBC 4.0 - 10.5 K/uL 7.5  9.4  5.8   Hemoglobin  12.0 - 15.0 g/dL 87.8  87.7  88.0   Hematocrit 36.0 - 46.0 % 36.4  35.7  35.4   Platelets 150 - 400 K/uL 198  211  192        Latest Ref Rng & Units 07/20/2024    8:34 AM 07/06/2024    7:44 AM 06/29/2024    8:05 AM  CMP  Glucose 70 - 99 mg/dL 877  898  861   BUN 8 - 23 mg/dL 59  49  52   Creatinine 0.44 - 1.00 mg/dL 8.73  8.83  8.69   Sodium 135 - 145 mmol/L 140  140  137   Potassium 3.5 - 5.1 mmol/L 4.3  3.9  4.1   Chloride 98 - 111 mmol/L 106  105  104   CO2 22 - 32 mmol/L 29  29  27    Calcium  8.9 - 10.3 mg/dL 9.6  9.2  9.3   Total Protein 6.5 - 8.1 g/dL 6.7  6.4  6.3   Total Bilirubin 0.0 - 1.2 mg/dL 0.3  0.3  0.3   Alkaline Phos 38 - 126 U/L 48  44  51   AST 15 - 41 U/L 24  22  19    ALT 0 - 44 U/L 34  33  24    Bone marrow biopsy 02/01/2024:    Surgical pathology 01/29/2024:    RADIOGRAPHIC STUDIES: I have personally reviewed the radiological images as listed and agreed with the findings in the report.  No results found.   ASSESSMENT & PLAN:  78 y.o. female with:  Newly diagnosed Multiple myeloma - Lambda restricted plasma cell neoplasm comprising greater than 75% of  the cellular marrow 2. Acute on CKD 3. HTN 4. DM2 5. Hypothyroidism  Patient Active Problem List   Diagnosis Date Noted   Symptomatic anemia 01/29/2024   Multiple myeloma (HCC) 01/29/2024   Acute renal failure superimposed on stage 3b chronic kidney disease (HCC) 01/29/2024   Anemia 11/26/2023   Hypothyroidism 02/23/2023   Type 2 diabetes mellitus with hyperglycemia, without long-term current use of insulin  (HCC) 07/26/2021   Impacted cerumen of right ear 07/06/2020   Type 2 diabetes mellitus with hyperglycemia (HCC) 04/11/2019   Hyperlipidemia LDL goal <70 03/23/2017   Abdominal pain, epigastric 09/03/2016   Strain of right biceps 03/17/2016   Right shoulder pain 03/17/2016   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   Plantar wart of right foot 03/15/2015   Uncontrolled type 2 diabetes mellitus with  hyperglycemia (HCC) 09/16/2013   Elevated lipids 09/05/2013   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 Hyponatremia (from 02/23/2023) chronic. Recent thyroid  function tests WNL. 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  PLAN:  -Discussed lab results on 07/20/2024 in detail with patient. CBC normal, showed WBC of 7.5K, hemoglobin of 12.1, and platelets of 198K. -CMP stable -electrolytes normal, calcium  normal, and creatinine stable at 1.26 -discussed that there are several colonies within a population of plasma cells. Discussed that while there is a particular M protein clone that is dominant, there is also a population of other clones that are also making abnormal protein.  -her myeloma panel from today is pending at time of clinical visit -her last myeloma lab from 06/22/2024 showed M protein of 0.5 g/dL  -patient has already had a 90% reduction in M protein, previously 6.5 g/dL. Discussed goal to drop her M protein as much as possible, and her response will have a bearing on how many cycles of her current treatment are needed prior to switching to maintenance treatment.  -discussed that there will be 6-10 cycles of treatment -discussed that there is a role to monitor for neuropathy with Velcade .  -discussed that her week off of velcade  with help to reduce the risk of neuropathy -patient is appropriate to proceed with cycle 6 day 1 of treatment today -we will continue Daratumumab  -we will continue Velcade  at this time -discussed that if there is continued improvement of M protein, then we will continue her current treatment until her M protein plateaus. Discussed that based on at what point her M protein plateaus, we may switch  velcade  to revlimid at that point, or may switch to maintenance treatment at that point.  -discussed that generally, velcade  would not be used for long term maintenance and there would be a need to swtich to a different medication, which would require a discussion at that point -we will stop post-treatment steroids at this time -discussed that over time, we may drop her pre-treatment steroids as well -discussed that in the case of dental procedures, there is a role to try to avoid any procedures that damage the tissues and increase the risks of infections. -Okay to proceed with minor dental filling. Recommend holding off on local anesthetics if it is not needed during her  dental filling procedure and if she agrees. She should be covered with a pyophylactic dose of antibiotics to reduce the risk of infections. She is noted to be on Amoxicillin  which she typically takes prior to dental visits.  -discussed that if there is consideration of a dental procedure involving digging into the jaw bone, bone strengthening medication can delay bone healing. However, this is not a limiting factor in patient's case since she is not on any bone-directed therapy at this time. -However, we discussed that bone-directed therapy may be needed down the line from a maintenance standpoint after her dental procedure.  -we will hold off on any bone-directed therapy until after her dental procedure is done -she shall return to clinic in 1 month -answered all of patient's and her family member's questions in detail  FOLLOW-UP: Per integrative scheduling for DVD MD visit in 4 weeks  The total time spent in the appointment was 30 minutes* .  All of the patient's questions were answered with apparent satisfaction. The patient knows to call the clinic with any problems, questions or concerns.   Emaline Saran MD MS AAHIVMS [facility] Hematology/Oncology Physician [facility]  .*Total Encounter Time as defined by  the Centers for Medicare and Medicaid Services includes, in addition to the face-to-face time of a patient visit (documented in the note above) non-face-to-face time: obtaining and reviewing outside history, ordering and reviewing medications, tests or procedures, care coordination (communications with other health care professionals or caregivers) and documentation in the medical record.    I,Mitra Faeizi,acting as a Neurosurgeon for Emaline Saran, MD.,have documented all relevant documentation on the behalf of Emaline Saran, MD,as directed by  Emaline Saran, MD while in the presence of Emaline Saran, MD.  .I have reviewed the above documentation for accuracy and completeness, and I agree with the above. .Camani Sesay Kishore Moo Gravley MD

## 2024-07-20 ENCOUNTER — Inpatient Hospital Stay

## 2024-07-20 ENCOUNTER — Other Ambulatory Visit: Payer: Self-pay

## 2024-07-20 ENCOUNTER — Inpatient Hospital Stay (HOSPITAL_BASED_OUTPATIENT_CLINIC_OR_DEPARTMENT_OTHER): Admitting: Hematology

## 2024-07-20 VITALS — BP 156/71 | HR 70 | Temp 97.7°F | Resp 18 | Wt 125.2 lb

## 2024-07-20 DIAGNOSIS — Z5112 Encounter for antineoplastic immunotherapy: Secondary | ICD-10-CM | POA: Diagnosis not present

## 2024-07-20 DIAGNOSIS — Z5111 Encounter for antineoplastic chemotherapy: Secondary | ICD-10-CM | POA: Diagnosis not present

## 2024-07-20 DIAGNOSIS — C9000 Multiple myeloma not having achieved remission: Secondary | ICD-10-CM

## 2024-07-20 DIAGNOSIS — Z79620 Long term (current) use of immunosuppressive biologic: Secondary | ICD-10-CM | POA: Diagnosis not present

## 2024-07-20 DIAGNOSIS — Z79899 Other long term (current) drug therapy: Secondary | ICD-10-CM | POA: Diagnosis not present

## 2024-07-20 LAB — CMP (CANCER CENTER ONLY)
ALT: 34 U/L (ref 0–44)
AST: 24 U/L (ref 15–41)
Albumin: 4.1 g/dL (ref 3.5–5.0)
Alkaline Phosphatase: 48 U/L (ref 38–126)
Anion gap: 5 (ref 5–15)
BUN: 59 mg/dL — ABNORMAL HIGH (ref 8–23)
CO2: 29 mmol/L (ref 22–32)
Calcium: 9.6 mg/dL (ref 8.9–10.3)
Chloride: 106 mmol/L (ref 98–111)
Creatinine: 1.26 mg/dL — ABNORMAL HIGH (ref 0.44–1.00)
GFR, Estimated: 44 mL/min — ABNORMAL LOW (ref >60)
Glucose, Bld: 122 mg/dL — ABNORMAL HIGH (ref 70–99)
Potassium: 4.3 mmol/L (ref 3.5–5.1)
Sodium: 140 mmol/L (ref 135–145)
Total Bilirubin: 0.3 mg/dL (ref 0.0–1.2)
Total Protein: 6.7 g/dL (ref 6.5–8.1)

## 2024-07-20 LAB — CBC WITH DIFFERENTIAL (CANCER CENTER ONLY)
Abs Immature Granulocytes: 0.01 K/uL (ref 0.00–0.07)
Basophils Absolute: 0 K/uL (ref 0.0–0.1)
Basophils Relative: 0 %
Eosinophils Absolute: 0.1 K/uL (ref 0.0–0.5)
Eosinophils Relative: 1 %
HCT: 36.4 % (ref 36.0–46.0)
Hemoglobin: 12.1 g/dL (ref 12.0–15.0)
Immature Granulocytes: 0 %
Lymphocytes Relative: 21 %
Lymphs Abs: 1.6 K/uL (ref 0.7–4.0)
MCH: 30.9 pg (ref 26.0–34.0)
MCHC: 33.2 g/dL (ref 30.0–36.0)
MCV: 92.9 fL (ref 80.0–100.0)
Monocytes Absolute: 0.6 K/uL (ref 0.1–1.0)
Monocytes Relative: 8 %
Neutro Abs: 5.3 K/uL (ref 1.7–7.7)
Neutrophils Relative %: 70 %
Platelet Count: 198 K/uL (ref 150–400)
RBC: 3.92 MIL/uL (ref 3.87–5.11)
RDW: 13.1 % (ref 11.5–15.5)
WBC Count: 7.5 K/uL (ref 4.0–10.5)
nRBC: 0 % (ref 0.0–0.2)

## 2024-07-20 MED ORDER — BORTEZOMIB CHEMO SQ INJECTION 3.5 MG (2.5MG/ML)
1.5000 mg/m2 | Freq: Once | INTRAMUSCULAR | Status: AC
  Administered 2024-07-20: 2.25 mg via SUBCUTANEOUS
  Filled 2024-07-20: qty 0.9

## 2024-07-20 MED ORDER — DARATUMUMAB-HYALURONIDASE-FIHJ 1800-30000 MG-UT/15ML ~~LOC~~ SOLN
1800.0000 mg | Freq: Once | SUBCUTANEOUS | Status: AC
  Administered 2024-07-20: 1800 mg via SUBCUTANEOUS
  Filled 2024-07-20: qty 15

## 2024-07-20 MED ORDER — ACYCLOVIR 400 MG PO TABS: 400.0000 mg | ORAL_TABLET | Freq: Two times a day (BID) | ORAL | 5 refills | Status: DC

## 2024-07-20 MED ORDER — ACETAMINOPHEN 325 MG PO TABS
650.0000 mg | ORAL_TABLET | Freq: Once | ORAL | Status: AC
Start: 2024-07-20 — End: 2024-07-20
  Administered 2024-07-20: 650 mg via ORAL
  Filled 2024-07-20: qty 2

## 2024-07-20 MED ORDER — DEXAMETHASONE 4 MG PO TABS
20.0000 mg | ORAL_TABLET | Freq: Once | ORAL | Status: AC
  Administered 2024-07-20: 20 mg via ORAL
  Filled 2024-07-20: qty 5

## 2024-07-20 MED ORDER — DIPHENHYDRAMINE HCL 25 MG PO CAPS
25.0000 mg | ORAL_CAPSULE | Freq: Once | ORAL | Status: AC
Start: 2024-07-20 — End: 2024-07-20
  Administered 2024-07-20: 25 mg via ORAL
  Filled 2024-07-20: qty 1

## 2024-07-20 NOTE — Patient Instructions (Signed)
 [facility]  Discharge Instructions: Thank you for choosing [facility] to provide your oncology and hematology care.   If you have a lab appointment with the Cancer Center, please go directly to the Cancer Center and check in at the registration area.   Wear comfortable clothing and clothing appropriate for easy access to any Portacath or PICC line.   We strive to give you quality time with your provider. You may need to reschedule your appointment if you arrive late (15 or more minutes).  Arriving late affects you and other patients whose appointments are after yours.  Also, if you miss three or more appointments without notifying the office, you may be dismissed from the clinic at the provider's discretion.      For prescription refill requests, have your pharmacy contact our office and allow 72 hours for refills to be completed.    Today you received the following chemotherapy and/or immunotherapy agents velcade, darzalex faspro      To help prevent nausea and vomiting after your treatment, we encourage you to take your nausea medication as directed.  BELOW ARE SYMPTOMS THAT SHOULD BE REPORTED IMMEDIATELY: *FEVER GREATER THAN 100.4 F (38 C) OR HIGHER *CHILLS OR SWEATING *NAUSEA AND VOMITING THAT IS NOT CONTROLLED WITH YOUR NAUSEA MEDICATION *UNUSUAL SHORTNESS OF BREATH *UNUSUAL BRUISING OR BLEEDING *URINARY PROBLEMS (pain or burning when urinating, or frequent urination) *BOWEL PROBLEMS (unusual diarrhea, constipation, pain near the anus) TENDERNESS IN MOUTH AND THROAT WITH OR WITHOUT PRESENCE OF ULCERS (sore throat, sores in mouth, or a toothache) UNUSUAL RASH, SWELLING OR PAIN  UNUSUAL VAGINAL DISCHARGE OR ITCHING   Items with * indicate a potential emergency and should be followed up as soon as possible or go to the Emergency Department if any problems should occur.  Please show the CHEMOTHERAPY ALERT CARD or  IMMUNOTHERAPY ALERT CARD at check-in to the Emergency Department and triage nurse.  Should you have questions after your visit or need to cancel or reschedule your appointment, please contact [facility] - A [facility]  Dept: [phone]  and follow the prompts.  Office hours are 8:00 a.m. to 4:30 p.m. Monday - Friday. Please note that voicemails left after 4:00 p.m. may not be returned until the following business day.  We are closed weekends and major holidays. You have access to a nurse at all times for urgent questions. Please call the main number to the clinic Dept: [phone] and follow the prompts.   For any non-urgent questions, you may also contact your provider using MyChart. We now offer e-Visits for anyone 56 and older to request care online for non-urgent symptoms. For details visit [email or web address].   Also download the MyChart app! Go to the app store, search "MyChart", open the app, select [place], and log in with your MyChart username and password.

## 2024-07-21 LAB — KAPPA/LAMBDA LIGHT CHAINS
Kappa free light chain: 4.7 mg/L (ref 3.3–19.4)
Kappa, lambda light chain ratio: 0.89 (ref 0.26–1.65)
Lambda free light chains: 5.3 mg/L — ABNORMAL LOW (ref 5.7–26.3)

## 2024-07-22 LAB — MULTIPLE MYELOMA PANEL, SERUM
Albumin SerPl Elph-Mcnc: 3.5 g/dL (ref 2.9–4.4)
Albumin/Glob SerPl: 1.4 (ref 0.7–1.7)
Alpha 1: 0.2 g/dL (ref 0.0–0.4)
Alpha2 Glob SerPl Elph-Mcnc: 0.8 g/dL (ref 0.4–1.0)
B-Globulin SerPl Elph-Mcnc: 0.9 g/dL (ref 0.7–1.3)
Gamma Glob SerPl Elph-Mcnc: 0.7 g/dL (ref 0.4–1.8)
Globulin, Total: 2.6 g/dL (ref 2.2–3.9)
IgA: <5 mg/dL — ABNORMAL LOW (ref 64–422)
IgG (Immunoglobin G), Serum: 779 mg/dL (ref 586–1602)
IgM (Immunoglobulin M), Srm: 18 mg/dL — ABNORMAL LOW (ref 26–217)
M Protein SerPl Elph-Mcnc: 0.5 g/dL — ABNORMAL HIGH
Total Protein ELP: 6.1 g/dL (ref 6.0–8.5)

## 2024-07-26 ENCOUNTER — Encounter: Payer: Self-pay | Admitting: Hematology

## 2024-07-27 ENCOUNTER — Inpatient Hospital Stay

## 2024-07-27 ENCOUNTER — Inpatient Hospital Stay: Attending: Hematology

## 2024-07-27 VITALS — BP 141/63 | HR 74 | Temp 98.2°F | Resp 16 | Wt 124.5 lb

## 2024-07-27 DIAGNOSIS — Z5112 Encounter for antineoplastic immunotherapy: Secondary | ICD-10-CM | POA: Insufficient documentation

## 2024-07-27 DIAGNOSIS — C9000 Multiple myeloma not having achieved remission: Secondary | ICD-10-CM

## 2024-07-27 DIAGNOSIS — Z79620 Long term (current) use of immunosuppressive biologic: Secondary | ICD-10-CM | POA: Insufficient documentation

## 2024-07-27 DIAGNOSIS — Z79899 Other long term (current) drug therapy: Secondary | ICD-10-CM | POA: Insufficient documentation

## 2024-07-27 LAB — CBC WITH DIFFERENTIAL (CANCER CENTER ONLY)
Abs Immature Granulocytes: 0.01 K/uL (ref 0.00–0.07)
Basophils Absolute: 0 K/uL (ref 0.0–0.1)
Basophils Relative: 0 %
Eosinophils Absolute: 0.1 K/uL (ref 0.0–0.5)
Eosinophils Relative: 1 %
HCT: 36.1 % (ref 36.0–46.0)
Hemoglobin: 12.1 g/dL (ref 12.0–15.0)
Immature Granulocytes: 0 %
Lymphocytes Relative: 20 %
Lymphs Abs: 1.5 K/uL (ref 0.7–4.0)
MCH: 30.8 pg (ref 26.0–34.0)
MCHC: 33.5 g/dL (ref 30.0–36.0)
MCV: 91.9 fL (ref 80.0–100.0)
Monocytes Absolute: 0.6 K/uL (ref 0.1–1.0)
Monocytes Relative: 8 %
Neutro Abs: 5.2 K/uL (ref 1.7–7.7)
Neutrophils Relative %: 71 %
Platelet Count: 195 K/uL (ref 150–400)
RBC: 3.93 MIL/uL (ref 3.87–5.11)
RDW: 13.1 % (ref 11.5–15.5)
WBC Count: 7.5 K/uL (ref 4.0–10.5)
nRBC: 0 % (ref 0.0–0.2)

## 2024-07-27 LAB — CMP (CANCER CENTER ONLY)
ALT: 41 U/L (ref 0–44)
AST: 34 U/L (ref 15–41)
Albumin: 4.1 g/dL (ref 3.5–5.0)
Alkaline Phosphatase: 52 U/L (ref 38–126)
Anion gap: 6 (ref 5–15)
BUN: 58 mg/dL — ABNORMAL HIGH (ref 8–23)
CO2: 26 mmol/L (ref 22–32)
Calcium: 9.3 mg/dL (ref 8.9–10.3)
Chloride: 107 mmol/L (ref 98–111)
Creatinine: 1.19 mg/dL — ABNORMAL HIGH (ref 0.44–1.00)
GFR, Estimated: 47 mL/min — ABNORMAL LOW (ref >60)
Glucose, Bld: 100 mg/dL — ABNORMAL HIGH (ref 70–99)
Potassium: 3.8 mmol/L (ref 3.5–5.1)
Sodium: 139 mmol/L (ref 135–145)
Total Bilirubin: 0.3 mg/dL (ref 0.0–1.2)
Total Protein: 6.3 g/dL — ABNORMAL LOW (ref 6.5–8.1)

## 2024-07-27 MED ORDER — DEXAMETHASONE 4 MG PO TABS
20.0000 mg | ORAL_TABLET | Freq: Once | ORAL | Status: AC
  Administered 2024-07-27: 20 mg via ORAL
  Filled 2024-07-27: qty 5

## 2024-07-27 MED ORDER — BORTEZOMIB CHEMO SQ INJECTION 3.5 MG (2.5MG/ML)
1.5000 mg/m2 | Freq: Once | INTRAMUSCULAR | Status: AC
  Administered 2024-07-27: 2.25 mg via SUBCUTANEOUS
  Filled 2024-07-27: qty 0.9

## 2024-08-01 NOTE — Progress Notes (Signed)
 07/20/24 follow up with Dr Onesimo and cycle 6 of 10 chemotherapy treatments. Will continue treatment and follow monthly.

## 2024-08-03 ENCOUNTER — Inpatient Hospital Stay

## 2024-08-03 VITALS — BP 160/64 | HR 73 | Temp 98.1°F | Resp 14 | Wt 124.5 lb

## 2024-08-03 DIAGNOSIS — Z5112 Encounter for antineoplastic immunotherapy: Secondary | ICD-10-CM | POA: Diagnosis not present

## 2024-08-03 DIAGNOSIS — C9000 Multiple myeloma not having achieved remission: Secondary | ICD-10-CM

## 2024-08-03 DIAGNOSIS — Z79620 Long term (current) use of immunosuppressive biologic: Secondary | ICD-10-CM | POA: Diagnosis not present

## 2024-08-03 DIAGNOSIS — Z79899 Other long term (current) drug therapy: Secondary | ICD-10-CM | POA: Diagnosis not present

## 2024-08-03 LAB — CMP (CANCER CENTER ONLY)
ALT: 41 U/L (ref 0–44)
AST: 26 U/L (ref 15–41)
Albumin: 3.9 g/dL (ref 3.5–5.0)
Alkaline Phosphatase: 49 U/L (ref 38–126)
Anion gap: 6 (ref 5–15)
BUN: 44 mg/dL — ABNORMAL HIGH (ref 8–23)
CO2: 29 mmol/L (ref 22–32)
Calcium: 9 mg/dL (ref 8.9–10.3)
Chloride: 105 mmol/L (ref 98–111)
Creatinine: 1.31 mg/dL — ABNORMAL HIGH (ref 0.44–1.00)
GFR, Estimated: 42 mL/min — ABNORMAL LOW (ref >60)
Glucose, Bld: 132 mg/dL — ABNORMAL HIGH (ref 70–99)
Potassium: 4 mmol/L (ref 3.5–5.1)
Sodium: 140 mmol/L (ref 135–145)
Total Bilirubin: 0.3 mg/dL (ref 0.0–1.2)
Total Protein: 6.1 g/dL — ABNORMAL LOW (ref 6.5–8.1)

## 2024-08-03 LAB — CBC WITH DIFFERENTIAL (CANCER CENTER ONLY)
Abs Immature Granulocytes: 0.02 K/uL (ref 0.00–0.07)
Basophils Absolute: 0 K/uL (ref 0.0–0.1)
Basophils Relative: 0 %
Eosinophils Absolute: 0.1 K/uL (ref 0.0–0.5)
Eosinophils Relative: 1 %
HCT: 35.4 % — ABNORMAL LOW (ref 36.0–46.0)
Hemoglobin: 12 g/dL (ref 12.0–15.0)
Immature Granulocytes: 0 %
Lymphocytes Relative: 21 %
Lymphs Abs: 1.4 K/uL (ref 0.7–4.0)
MCH: 30.9 pg (ref 26.0–34.0)
MCHC: 33.9 g/dL (ref 30.0–36.0)
MCV: 91.2 fL (ref 80.0–100.0)
Monocytes Absolute: 0.6 K/uL (ref 0.1–1.0)
Monocytes Relative: 9 %
Neutro Abs: 4.7 K/uL (ref 1.7–7.7)
Neutrophils Relative %: 69 %
Platelet Count: 190 K/uL (ref 150–400)
RBC: 3.88 MIL/uL (ref 3.87–5.11)
RDW: 13 % (ref 11.5–15.5)
WBC Count: 6.9 K/uL (ref 4.0–10.5)
nRBC: 0 % (ref 0.0–0.2)

## 2024-08-03 MED ORDER — DARATUMUMAB-HYALURONIDASE-FIHJ 1800-30000 MG-UT/15ML ~~LOC~~ SOLN
1800.0000 mg | Freq: Once | SUBCUTANEOUS | Status: AC
  Administered 2024-08-03 (×2): 1800 mg via SUBCUTANEOUS
  Filled 2024-08-03: qty 15

## 2024-08-03 MED ORDER — DEXAMETHASONE 4 MG PO TABS
20.0000 mg | ORAL_TABLET | Freq: Once | ORAL | Status: AC
  Administered 2024-08-03 (×2): 20 mg via ORAL
  Filled 2024-08-03: qty 5

## 2024-08-03 MED ORDER — BORTEZOMIB CHEMO SQ INJECTION 3.5 MG (2.5MG/ML)
1.5000 mg/m2 | Freq: Once | INTRAMUSCULAR | Status: AC
  Administered 2024-08-03 (×2): 2.25 mg via SUBCUTANEOUS
  Filled 2024-08-03: qty 0.9

## 2024-08-03 MED ORDER — DIPHENHYDRAMINE HCL 25 MG PO CAPS
25.0000 mg | ORAL_CAPSULE | Freq: Once | ORAL | Status: AC
  Administered 2024-08-03 (×2): 25 mg via ORAL
  Filled 2024-08-03: qty 1

## 2024-08-03 MED ORDER — ACETAMINOPHEN 325 MG PO TABS
650.0000 mg | ORAL_TABLET | Freq: Once | ORAL | Status: AC
  Administered 2024-08-03 (×2): 650 mg via ORAL
  Filled 2024-08-03: qty 2

## 2024-08-03 NOTE — Patient Instructions (Signed)
 [facility]  Discharge Instructions: Thank you for choosing [facility] to provide your oncology and hematology care.   If you have a lab appointment with the Cancer Center, please go directly to the Cancer Center and check in at the registration area.   Wear comfortable clothing and clothing appropriate for easy access to any Portacath or PICC line.   We strive to give you quality time with your provider. You may need to reschedule your appointment if you arrive late (15 or more minutes).  Arriving late affects you and other patients whose appointments are after yours.  Also, if you miss three or more appointments without notifying the office, you may be dismissed from the clinic at the provider's discretion.      For prescription refill requests, have your pharmacy contact our office and allow 72 hours for refills to be completed.    Today you received the following chemotherapy and/or immunotherapy agents velcade, darzalex faspro      To help prevent nausea and vomiting after your treatment, we encourage you to take your nausea medication as directed.  BELOW ARE SYMPTOMS THAT SHOULD BE REPORTED IMMEDIATELY: *FEVER GREATER THAN 100.4 F (38 C) OR HIGHER *CHILLS OR SWEATING *NAUSEA AND VOMITING THAT IS NOT CONTROLLED WITH YOUR NAUSEA MEDICATION *UNUSUAL SHORTNESS OF BREATH *UNUSUAL BRUISING OR BLEEDING *URINARY PROBLEMS (pain or burning when urinating, or frequent urination) *BOWEL PROBLEMS (unusual diarrhea, constipation, pain near the anus) TENDERNESS IN MOUTH AND THROAT WITH OR WITHOUT PRESENCE OF ULCERS (sore throat, sores in mouth, or a toothache) UNUSUAL RASH, SWELLING OR PAIN  UNUSUAL VAGINAL DISCHARGE OR ITCHING   Items with * indicate a potential emergency and should be followed up as soon as possible or go to the Emergency Department if any problems should occur.  Please show the CHEMOTHERAPY ALERT CARD or  IMMUNOTHERAPY ALERT CARD at check-in to the Emergency Department and triage nurse.  Should you have questions after your visit or need to cancel or reschedule your appointment, please contact [facility] - A [facility]  Dept: [phone]  and follow the prompts.  Office hours are 8:00 a.m. to 4:30 p.m. Monday - Friday. Please note that voicemails left after 4:00 p.m. may not be returned until the following business day.  We are closed weekends and major holidays. You have access to a nurse at all times for urgent questions. Please call the main number to the clinic Dept: [phone] and follow the prompts.   For any non-urgent questions, you may also contact your provider using MyChart. We now offer e-Visits for anyone 56 and older to request care online for non-urgent symptoms. For details visit [email or web address].   Also download the MyChart app! Go to the app store, search "MyChart", open the app, select [place], and log in with your MyChart username and password.

## 2024-08-09 DIAGNOSIS — E1122 Type 2 diabetes mellitus with diabetic chronic kidney disease: Secondary | ICD-10-CM | POA: Diagnosis not present

## 2024-08-09 DIAGNOSIS — N179 Acute kidney failure, unspecified: Secondary | ICD-10-CM | POA: Diagnosis not present

## 2024-08-09 DIAGNOSIS — N189 Chronic kidney disease, unspecified: Secondary | ICD-10-CM | POA: Diagnosis not present

## 2024-08-12 DIAGNOSIS — E1122 Type 2 diabetes mellitus with diabetic chronic kidney disease: Secondary | ICD-10-CM | POA: Diagnosis not present

## 2024-08-12 DIAGNOSIS — I129 Hypertensive chronic kidney disease with stage 1 through stage 4 chronic kidney disease, or unspecified chronic kidney disease: Secondary | ICD-10-CM | POA: Diagnosis not present

## 2024-08-12 DIAGNOSIS — R7989 Other specified abnormal findings of blood chemistry: Secondary | ICD-10-CM | POA: Diagnosis not present

## 2024-08-12 DIAGNOSIS — N1831 Chronic kidney disease, stage 3a: Secondary | ICD-10-CM | POA: Diagnosis not present

## 2024-08-12 DIAGNOSIS — C9000 Multiple myeloma not having achieved remission: Secondary | ICD-10-CM | POA: Diagnosis not present

## 2024-08-12 DIAGNOSIS — D631 Anemia in chronic kidney disease: Secondary | ICD-10-CM | POA: Diagnosis not present

## 2024-08-12 DIAGNOSIS — N2581 Secondary hyperparathyroidism of renal origin: Secondary | ICD-10-CM | POA: Diagnosis not present

## 2024-08-17 ENCOUNTER — Inpatient Hospital Stay

## 2024-08-17 ENCOUNTER — Encounter: Payer: Self-pay | Admitting: Hematology

## 2024-08-17 ENCOUNTER — Telehealth: Payer: Self-pay

## 2024-08-17 ENCOUNTER — Other Ambulatory Visit (HOSPITAL_COMMUNITY): Payer: Self-pay

## 2024-08-17 ENCOUNTER — Inpatient Hospital Stay (HOSPITAL_BASED_OUTPATIENT_CLINIC_OR_DEPARTMENT_OTHER): Admitting: Hematology

## 2024-08-17 ENCOUNTER — Other Ambulatory Visit: Payer: Self-pay

## 2024-08-17 VITALS — BP 143/75 | HR 76 | Temp 97.3°F | Resp 18 | Wt 125.3 lb

## 2024-08-17 DIAGNOSIS — C9000 Multiple myeloma not having achieved remission: Secondary | ICD-10-CM

## 2024-08-17 DIAGNOSIS — Z79620 Long term (current) use of immunosuppressive biologic: Secondary | ICD-10-CM | POA: Diagnosis not present

## 2024-08-17 DIAGNOSIS — Z5111 Encounter for antineoplastic chemotherapy: Secondary | ICD-10-CM | POA: Diagnosis not present

## 2024-08-17 DIAGNOSIS — Z5112 Encounter for antineoplastic immunotherapy: Secondary | ICD-10-CM | POA: Diagnosis not present

## 2024-08-17 DIAGNOSIS — Z79899 Other long term (current) drug therapy: Secondary | ICD-10-CM | POA: Diagnosis not present

## 2024-08-17 LAB — CBC WITH DIFFERENTIAL (CANCER CENTER ONLY)
Abs Immature Granulocytes: 0.02 K/uL (ref 0.00–0.07)
Basophils Absolute: 0 K/uL (ref 0.0–0.1)
Basophils Relative: 1 %
Eosinophils Absolute: 0.1 K/uL (ref 0.0–0.5)
Eosinophils Relative: 1 %
HCT: 35.7 % — ABNORMAL LOW (ref 36.0–46.0)
Hemoglobin: 12 g/dL (ref 12.0–15.0)
Immature Granulocytes: 0 %
Lymphocytes Relative: 21 %
Lymphs Abs: 1.4 K/uL (ref 0.7–4.0)
MCH: 30.7 pg (ref 26.0–34.0)
MCHC: 33.6 g/dL (ref 30.0–36.0)
MCV: 91.3 fL (ref 80.0–100.0)
Monocytes Absolute: 0.5 K/uL (ref 0.1–1.0)
Monocytes Relative: 8 %
Neutro Abs: 4.6 K/uL (ref 1.7–7.7)
Neutrophils Relative %: 69 %
Platelet Count: 202 K/uL (ref 150–400)
RBC: 3.91 MIL/uL (ref 3.87–5.11)
RDW: 13 % (ref 11.5–15.5)
WBC Count: 6.5 K/uL (ref 4.0–10.5)
nRBC: 0 % (ref 0.0–0.2)

## 2024-08-17 LAB — CMP (CANCER CENTER ONLY)
ALT: 51 U/L — ABNORMAL HIGH (ref 0–44)
AST: 29 U/L (ref 15–41)
Albumin: 4 g/dL (ref 3.5–5.0)
Alkaline Phosphatase: 49 U/L (ref 38–126)
Anion gap: 6 (ref 5–15)
BUN: 52 mg/dL — ABNORMAL HIGH (ref 8–23)
CO2: 25 mmol/L (ref 22–32)
Calcium: 8.9 mg/dL (ref 8.9–10.3)
Chloride: 108 mmol/L (ref 98–111)
Creatinine: 1.3 mg/dL — ABNORMAL HIGH (ref 0.44–1.00)
GFR, Estimated: 43 mL/min — ABNORMAL LOW (ref >60)
Glucose, Bld: 144 mg/dL — ABNORMAL HIGH (ref 70–99)
Potassium: 3.8 mmol/L (ref 3.5–5.1)
Sodium: 139 mmol/L (ref 135–145)
Total Bilirubin: 0.4 mg/dL (ref 0.0–1.2)
Total Protein: 6.4 g/dL — ABNORMAL LOW (ref 6.5–8.1)

## 2024-08-17 MED ORDER — LENALIDOMIDE 15 MG PO CAPS: 15.0000 mg | ORAL_CAPSULE | Freq: Every day | ORAL | 0 refills | Status: DC

## 2024-08-17 MED ORDER — DIPHENHYDRAMINE HCL 25 MG PO CAPS
25.0000 mg | ORAL_CAPSULE | Freq: Once | ORAL | Status: AC
  Administered 2024-08-17: 25 mg via ORAL
  Filled 2024-08-17: qty 1

## 2024-08-17 MED ORDER — BORTEZOMIB CHEMO SQ INJECTION 3.5 MG (2.5MG/ML)
1.5000 mg/m2 | Freq: Once | INTRAMUSCULAR | Status: AC
  Administered 2024-08-17: 2.25 mg via SUBCUTANEOUS
  Filled 2024-08-17: qty 0.9

## 2024-08-17 MED ORDER — DARATUMUMAB-HYALURONIDASE-FIHJ 1800-30000 MG-UT/15ML ~~LOC~~ SOLN
1800.0000 mg | Freq: Once | SUBCUTANEOUS | Status: AC
  Administered 2024-08-17: 1800 mg via SUBCUTANEOUS
  Filled 2024-08-17: qty 15

## 2024-08-17 MED ORDER — LENALIDOMIDE 15 MG PO CAPS
15.0000 mg | ORAL_CAPSULE | Freq: Every day | ORAL | 1 refills | Status: DC
Start: 2024-08-17 — End: 2024-08-17

## 2024-08-17 MED ORDER — ACETAMINOPHEN 325 MG PO TABS
650.0000 mg | ORAL_TABLET | Freq: Once | ORAL | Status: AC
  Administered 2024-08-17: 650 mg via ORAL
  Filled 2024-08-17: qty 2

## 2024-08-17 MED ORDER — DEXAMETHASONE 4 MG PO TABS
20.0000 mg | ORAL_TABLET | Freq: Once | ORAL | Status: AC
  Administered 2024-08-17: 20 mg via ORAL
  Filled 2024-08-17: qty 5

## 2024-08-17 NOTE — Telephone Encounter (Signed)
 Oral Oncology Patient Advocate Encounter  After completing a benefits investigation, prior authorization for Revlimid  is not required at this time through [employer].  Patient's copay is $2,740.78.  She has $3,000 max out of pocket to reach. Left message for patient to call me back to see if she can afford that for this month, then she will only have $270 left to pay, then it will be fully covered at no cost.   Lucie Lamer, CPhT [facility]  [facility] Pharmacy Technician Patient Advocate Specialist [facility] [place] Phone: [phone]  Fax: [phone] [email or web address]

## 2024-08-17 NOTE — Telephone Encounter (Signed)
 Oral Oncology Pharmacist Encounter  Received new prescription for Revlimid  (lenalidomide ) for the treatment of multiple myeloma in conjunction with daratumumab , velcade  and dexamethasone , planned duration until disease progression or unacceptable toxicity.  Labs from 08/17/2024 (CBC and CMP) assessed, patients CrCl is 33.01 based on her creatinine of 1.30 today. I have sent message to MD regarding dosage to see if he would like to reduce from 15mg  to 10mg  for two cycles for tolerability. Discussed with MD and he would like to proceed with the 15mg  as she may appear to have a resistant residual clone and will be planning to dial down to maintenance sooner dependence on response/tolerance. Prescription dose and frequency assessed for appropriateness. Patient will be monitored closely.   Current medication list in Epic reviewed, no significant/ relevant DDIs with Revlimid  identified.   Evaluated chart and no patient barriers to medication adherence noted.   Prescription has been e-scribed to the [facility] for benefits analysis and approval.  Oral Oncology Clinic will continue to follow for insurance authorization, copayment issues, initial counseling and start date.  Travis Mastel, PharmD Hematology/Oncology Clinical Pharmacist [facility] Oral Chemotherapy Navigation Clinic [phone] 08/17/2024 10:24 AM

## 2024-08-18 ENCOUNTER — Encounter: Payer: Self-pay | Admitting: Hematology

## 2024-08-18 ENCOUNTER — Other Ambulatory Visit: Payer: Self-pay

## 2024-08-18 ENCOUNTER — Other Ambulatory Visit: Payer: Self-pay | Admitting: Family Medicine

## 2024-08-18 DIAGNOSIS — E1165 Type 2 diabetes mellitus with hyperglycemia: Secondary | ICD-10-CM

## 2024-08-18 DIAGNOSIS — C9000 Multiple myeloma not having achieved remission: Secondary | ICD-10-CM

## 2024-08-18 DIAGNOSIS — E039 Hypothyroidism, unspecified: Secondary | ICD-10-CM

## 2024-08-18 LAB — KAPPA/LAMBDA LIGHT CHAINS
Kappa free light chain: 4.3 mg/L (ref 3.3–19.4)
Kappa, lambda light chain ratio: 0.9 (ref 0.26–1.65)
Lambda free light chains: 4.8 mg/L — ABNORMAL LOW (ref 5.7–26.3)

## 2024-08-18 MED ORDER — LENALIDOMIDE 15 MG PO CAPS: 15.0000 mg | ORAL_CAPSULE | Freq: Every day | ORAL | 0 refills | Status: DC

## 2024-08-18 NOTE — Telephone Encounter (Signed)
 Oral Oncology Patient Advocate Encounter  I spoke with both her & husband Rolan and they are okay paying the cost of this medication this month. I let them know it will be sent to [facility] and to be on the lookout from a phone call from them.   Lucie Lamer, CPhT [place]  [facility] Pharmacy Technician Patient Advocate Specialist II [place] Phone: [phone]  Fax: [phone] [email or web address]

## 2024-08-22 LAB — MULTIPLE MYELOMA PANEL, SERUM
Albumin SerPl Elph-Mcnc: 3.4 g/dL (ref 2.9–4.4)
Albumin/Glob SerPl: 1.4 (ref 0.7–1.7)
Alpha 1: 0.2 g/dL (ref 0.0–0.4)
Alpha2 Glob SerPl Elph-Mcnc: 0.8 g/dL (ref 0.4–1.0)
B-Globulin SerPl Elph-Mcnc: 0.8 g/dL (ref 0.7–1.3)
Gamma Glob SerPl Elph-Mcnc: 0.6 g/dL (ref 0.4–1.8)
Globulin, Total: 2.5 g/dL (ref 2.2–3.9)
IgA: <5 mg/dL — ABNORMAL LOW (ref 64–422)
IgG (Immunoglobin G), Serum: 763 mg/dL (ref 586–1602)
IgM (Immunoglobulin M), Srm: 21 mg/dL — ABNORMAL LOW (ref 26–217)
M Protein SerPl Elph-Mcnc: 0.4 g/dL — ABNORMAL HIGH
Total Protein ELP: 5.9 g/dL — ABNORMAL LOW (ref 6.0–8.5)

## 2024-08-23 ENCOUNTER — Encounter: Payer: Self-pay | Admitting: Hematology

## 2024-08-23 NOTE — Progress Notes (Signed)
 HEMATOLOGY/ONCOLOGY CLINIC NOTE  Date of Service: .08/17/2024  Patient Care Team: Antonio Meth, Jamee SAUNDERS, DO as PCP - General Delford Maude BROCKS, MD as PCP - Cardiology (Cardiology) Rosan Credit, MD as Consulting Physician (Ophthalmology) Obie Princella HERO, MD (Inactive) (Gastroenterology) Vernetta Lonni GRADE, MD as Consulting Physician (Orthopedic Surgery) Ivin Kocher, MD as Referring Physician (Dermatology) Georgia Blonder, MD as Consulting Physician (Obstetrics and Gynecology) Onesimo Emaline Brink, MD as Consulting Physician (Hematology)  CHIEF COMPLAINTS/PURPOSE OF CONSULTATION:  Follow-up for continued evaluation and management of multiple myeloma  HISTORY OF PRESENTING ILLNESS:   Cynthia Burns is a wonderful 78 y.o. female who was scheduled to see Dr. Andriette as a new patient and was referred to the ED for evaluation and management of newly diagnosed myeloma with rapidly worsening hgb and renal insuffiencey with proteinuria. High suspicious of Multiple myeloma.    Outside labs done at Dr Ephriam Singh's office (available in referral under media) show M spike of 6.1g/dl and Kappa light chains in the 400's.   Patient is accompanied by her husband and her daughter at bed side during the visit. Patient notes she has been having significantly more fatigue. Has required PRBC transfusions for symptomatic anemia.   She complains of fatigue, right ankle pain, and unexpected weight loss of around 25-30 lbs due to appetite loss in the past year. Her daughter notes that the patient has been confused more often as well.    She denies any new infection issues, fever, chills, back pain, chest pain, abdominal pain, or leg swelling.    Patient notes she tested positive for COVID-19 infection in August 2022 and was prescribed Paxlovid . However, she did not tolerate the medication due to allergic reaction.   PmHx of hypertension. She has been taking Losartan  and hydralazine .    Surgery history: Hysterectomy and Cholecystectomy.    She was started on dexamethasone  20 mg daily x 4 doses yesterday. She has been tolerating the high-dose steroid well.   INTERVAL HISTORY:  Cynthia Burns is a 78 y.o. female who is here for continued evaluation and management of her multiple myeloma. She is accompanied by her husband with her daughter on the phone. She notes no new fatigue, bleeding issues or infections. Eating and drinking well. No new toxicities from her current treatments. No new tingling or numbness in her hands or feet.   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   HSV-1 infection    HYPERTENSION    Hypothyroid    MITRAL VALVE PROLAPSE    OSTEOPENIA    Overweight(278.02)    PHARYNGITIS, ACUTE     SURGICAL HISTORY: Past Surgical History:  Procedure Laterality Date   ABDOMINAL HYSTERECTOMY     CHOLECYSTECTOMY     prolped bladder     WISDOM TOOTH EXTRACTION      SOCIAL HISTORY: Social History   Socioeconomic History   Marital status: Married    Spouse name: Not on file   Number of children: Not on file   Years of education: Not on file   Highest education level: Associate degree: occupational, Scientist, product/process development, or vocational program  Occupational History  Occupation: hairdresser  Tobacco Use   Smoking status: Never   Smokeless tobacco: Never  Vaping Use   Vaping status: Never Used  Substance and Sexual Activity   Alcohol use: No    Alcohol/week: 0.0 standard drinks of alcohol   Drug use: No   Sexual activity: Not Currently    Partners: Male    Birth control/protection: Surgical, Abstinence    Comment: hysterectomy, 17, less than 5  Other Topics Concern   Not  on file  Social History Narrative   Not on file   Social Drivers of Health   Financial Resource Strain: Low Risk  (12/07/2023)   Overall Financial Resource Strain (CARDIA)    Difficulty of Paying Living Expenses: Not hard at all  Food Insecurity: Patient Declined (02/29/2024)   Hunger Vital Sign    Worried About Running Out of Food in the Last Year: Patient declined    Ran Out of Food in the Last Year: Patient declined  Transportation Needs: No Transportation Needs (02/29/2024)   PRAPARE - Administrator, Civil Service (Medical): No    Lack of Transportation (Non-Medical): No  Physical Activity: Insufficiently Active (12/07/2023)   Exercise Vital Sign    Days of Exercise per Week: 1 day    Minutes of Exercise per Session: 10 min  Stress: No Stress Concern Present (12/07/2023)   [employer] of Occupational Health - Occupational Stress Questionnaire    Feeling of Stress : Only a little  Social Connections: Socially Integrated (02/29/2024)   Social Connection and Isolation Panel    Frequency of Communication with Friends and Family: More than three times a week    Frequency of Social Gatherings with Friends and Family: More than three times a week    Attends Religious Services: More than 4 times per year    Active Member of [employer] or Organizations: Yes    Attends Engineer, structural: More than 4 times per year    Marital Status: Married  Catering manager Violence: Not At Risk (02/29/2024)   Humiliation, Afraid, Rape, and Kick questionnaire    Fear of Current or Ex-Partner: No    Emotionally Abused: No    Physically Abused: No    Sexually Abused: No    FAMILY HISTORY: Family History  Problem Relation Age of Onset   Hypertension Mother    Heart disease Mother        Had a stent   Emphysema Father    Diabetes Father    COPD Father    Stroke Sister    Breast cancer Neg Hx     ALLERGIES:  is allergic to mucinex [guaifenesin er], bystolic  [nebivolol   hcl], calcium -containing compounds, codeine, paxlovid  [nirmatrelvir -ritonavir ], advicor [niacin-lovastatin er], amlodipine, lisinopril-hydrochlorothiazide, other, ramipril, and sulfonamide derivatives.  MEDICATIONS:  Current Outpatient Medications  Medication Sig Dispense Refill   pantoprazole  (PROTONIX ) 40 MG tablet Pantoprazole  40 mg before breakfast and dinner for 90 days with 3 refills (Patient taking differently: Take 40 mg by mouth See admin instructions. Take 40 mg by mouth 30 minutes before lunch and supper) 180 tablet 3   acyclovir  (ZOVIRAX ) 400 MG tablet Take 1 tablet (400 mg total) by mouth 2 (two) times daily. 60 tablet 5   amoxicillin  (AMOXIL ) 500 MG capsule Take 2,000 mg by mouth See admin instructions. Take 2,000 mg by mouth one hour prior to dental visits (Patient not taking: Reported on 07/20/2024)     Biotin 5000 MCG CAPS Take 5,000 mcg by mouth daily.  Blood Glucose Monitoring Suppl (ONE TOUCH ULTRA 2) w/Device KIT CHECK BLOOD SUGAR TWICE DAILY.  DX CODE  E11.9 1 kit 0   Cholecalciferol (VITAMIN D3) 1000 units CAPS Take 1,000 Units by mouth daily.     clonazePAM  (KLONOPIN ) 0.5 MG tablet Take 1 tablet (0.5 mg total) by mouth 2 (two) times daily as needed for anxiety. 30 tablet 0   Cyanocobalamin  (VITAMIN B-12) 3000 MCG SUBL Place 3,000 mcg under the tongue daily in the afternoon.     dexamethasone  (DECADRON ) 4 MG tablet TAKE FIVE TABLETS BY MOUTH EVERY DAY WITH BREAKFAST THE DAY AFTER EVERY DARATUMUMAB  DOSE 30 tablet 1   fexofenadine (ALLEGRA) 180 MG tablet Take 180 mg by mouth daily.     fexofenadine-pseudoephedrine (ALLEGRA-D 24) 180-240 MG 24 hr tablet Take 1 tablet by mouth daily.     furosemide  (LASIX ) 20 MG tablet TAKE 1 TABLET BY MOUTH EVERY DAY (Patient not taking: Reported on 07/20/2024) 90 tablet 1   glucose blood (ONETOUCH ULTRA TEST) test strip Use as instructed 200 each 3   [Paused] hydrALAZINE  (APRESOLINE ) 25 MG tablet Take 25 mg by mouth See admin instructions.  Take 25 mg by mouth at 8 AM, 3 PM, and 11 PM- in conjunction with one 50 mg tablet to equal a total dose of 75 mg     insulin  aspart (NOVOLOG  FLEXPEN) 100 UNIT/ML FlexPen Per sliding scale max 40 u daily 15 mL 3   Insulin  Pen Needle (B-D UF III MINI PEN NEEDLES) 31G X 5 MM MISC To use w/ Novolog  100 each 2   Lancets (ONETOUCH DELICA PLUS LANCET33G) MISC USE 1 LANCET TO TEST AS DIRECTED (DISCARD LANCET IN SHARPS CONTAINER IMMEDIATELY AFTER USE) 200 each 1   lenalidomide  (REVLIMID ) 15 MG capsule Take 1 capsule (15 mg total) by mouth daily. Take 1 capsule (15 mg total) by mouth daily for 21 days and then take 7 days off. 21 capsule 0   levothyroxine  (SYNTHROID ) 25 MCG tablet Take 1 tablet (25 mcg total) by mouth daily before breakfast. 90 tablet 0   Multiple Vitamins-Minerals (PRESERVISION AREDS 2) CAPS Take 1 capsule by mouth 2 (two) times daily with a meal.     ondansetron  (ZOFRAN ) 8 MG tablet Take 8 mg by mouth 30 to 60 min prior to Cyclophosphamide administration then take 8 mg every 8 hrs as needed for nausea and vomiting. 30 tablet 1   prochlorperazine  (COMPAZINE ) 10 MG tablet Take 1 tablet (10 mg total) by mouth every 6 (six) hours as needed for nausea or vomiting. 30 tablet 1   rosuvastatin  (CRESTOR ) 20 MG tablet TAKE ONE TABLET BY MOUTH EVERY DAY 90 tablet 3   Semaglutide  (RYBELSUS ) 7 MG TABS Take 1 tablet (7 mg total) by mouth daily. 90 tablet 0   SYSTANE ULTRA PF 0.4-0.3 % SOLN Place 1 drop into both eyes See admin instructions. Instill 1 drop into both eyes one to four times a day     No current facility-administered medications for this visit.    REVIEW OF SYSTEMS:   .10 Point review of Systems was done is negative except as noted above.  PHYSICAL EXAMINATION:  ECOG PERFORMANCE STATUS: 2 - Symptomatic, <50% confined to bed .BP (!) 143/75   Pulse 76   Temp (!) 97.3 F (36.3 C)   Resp 18   Wt 125 lb 4.8 oz (56.8 kg)   SpO2 99%   BMI 23.68 kg/m  . GENERAL:alert, in no acute  distress and comfortable SKIN: no  acute rashes, no significant lesions EYES: conjunctiva are pink and non-injected, sclera anicteric OROPHARYNX: MMM, no exudates, no oropharyngeal erythema or ulceration NECK: supple, no JVD LYMPH:  no palpable lymphadenopathy in the cervical, axillary or inguinal regions LUNGS: clear to auscultation b/l with normal respiratory effort HEART: regular rate & rhythm ABDOMEN:  normoactive bowel sounds , non tender, not distended. Extremity: no pedal edema PSYCH: alert & oriented x 3 with fluent speech NEURO: no focal motor/sensory deficits   LABORATORY DATA:  I have reviewed the data as listed  .    Latest Ref Rng & Units 08/17/2024    8:38 AM 08/03/2024    8:51 AM 07/27/2024    8:24 AM  CBC  WBC 4.0 - 10.5 K/uL 6.5  6.9  7.5   Hemoglobin 12.0 - 15.0 g/dL 87.9  87.9  87.8   Hematocrit 36.0 - 46.0 % 35.7  35.4  36.1   Platelets 150 - 400 K/uL 202  190  195        Latest Ref Rng & Units 08/17/2024    8:38 AM 08/03/2024    8:51 AM 07/27/2024    8:24 AM  CMP  Glucose 70 - 99 mg/dL 855  867  899   BUN 8 - 23 mg/dL 52  44  58   Creatinine 0.44 - 1.00 mg/dL 8.69  8.68  8.80   Sodium 135 - 145 mmol/L 139  140  139   Potassium 3.5 - 5.1 mmol/L 3.8  4.0  3.8   Chloride 98 - 111 mmol/L 108  105  107   CO2 22 - 32 mmol/L 25  29  26    Calcium  8.9 - 10.3 mg/dL 8.9  9.0  9.3   Total Protein 6.5 - 8.1 g/dL 6.4  6.1  6.3   Total Bilirubin 0.0 - 1.2 mg/dL 0.4  0.3  0.3   Alkaline Phos 38 - 126 U/L 49  49  52   AST 15 - 41 U/L 29  26  34   ALT 0 - 44 U/L 51  41  41    Bone marrow biopsy 02/01/2024:    Surgical pathology 01/29/2024:    RADIOGRAPHIC STUDIES: I have personally reviewed the radiological images as listed and agreed with the findings in the report. No results found.   ASSESSMENT & PLAN:  78 y.o. female with:  Multiple myeloma - Lambda restricted plasma cell neoplasm comprising greater than 75% of  the cellular marrow  Molecular Cyto  t(11;14) PET/CT with no bone lesions 2. Acute on CKD - resolved to baseline 3. HTN 4. DM2 5. Hypothyroidism  Patient Active Problem List   Diagnosis Date Noted   Symptomatic anemia 01/29/2024   Multiple myeloma (HCC) 01/29/2024   Acute renal failure superimposed on stage 3b chronic kidney disease (HCC) 01/29/2024   Anemia 11/26/2023   Hypothyroidism 02/23/2023   Type 2 diabetes mellitus with hyperglycemia, without long-term current use of insulin  (HCC) 07/26/2021   Impacted cerumen of right ear 07/06/2020   Type 2 diabetes mellitus with hyperglycemia (HCC) 04/11/2019   Hyperlipidemia LDL goal <70 03/23/2017   Abdominal pain, epigastric 09/03/2016   Strain of right biceps 03/17/2016   Right shoulder pain 03/17/2016   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   Plantar wart of right foot 03/15/2015   Uncontrolled type 2 diabetes mellitus with hyperglycemia (HCC) 09/16/2013   Elevated lipids 09/05/2013   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   OVERWEIGHT 02/07/2009   PHARYNGITIS, ACUTE 06/03/2007   Essential hypertension  05/18/2007   MITRAL VALVE PROLAPSE 05/18/2007   OSTEOPENIA 05/18/2007   6. Hyponatremia (from 02/23/2023) chronic. Recent thyroid  function tests WNL. Resolved.  PLAN: -Discussed lab results from 08/17/2024 in detail with the patient CBC Is stable with normal hemoglobin of 12 and normal WBC count and platelets CMP stable with resolved hyponatremia, creatinine of 1.3 ALT of 51. Myeloma panel shows somewhat plateaued M spike of 0.4 g/dL Serum kappa lambda free light chains are within normal limits with normal ratio No notable toxicities from her Dara Velcade  dexamethasone  regimen. We discussed that she has had VGPR already .  We discussed option for consolidative HDT/Auto HSCT and after detailed discussion the patient declines this option. - We will switch to Dara Revlimid  dexamethasone  to try to get a deeper response and avoid development of neuropathy with ongoing Velcade  in  the context of her diabetes. Revlimid  will be started at 15 mg 3 weeks on 1 week off based on renal function. Will then plan to pursue dual drug maintenance with Dara Revlimid  if she tolerates the Revlimid  well. Recommended patient continue to drink at least 2 L of water a day Follow-up with PCP for optimization of hypertension and diabetes management and hypothyroidism treatment.  FOLLOW-UP: Please schedule next 2 cycles of treatment per integrated scheduling.  Will plan to drop the Velcade  when the Revlimid  is available. MD visit in 1 months  The total time spent in the appointment was 32 minutes*.  All of the patient's questions were answered with apparent satisfaction. The patient knows to call the clinic with any problems, questions or concerns.   Emaline Saran MD MS AAHIVMS [facility] Hematology/Oncology Physician [facility]  .*Total Encounter Time as defined by the Centers for Medicare and Medicaid Services includes, in addition to the face-to-face time of a patient visit (documented in the note above) non-face-to-face time: obtaining and reviewing outside history, ordering and reviewing medications, tests or procedures, care coordination (communications with other health care professionals or caregivers) and documentation in the medical record.

## 2024-08-24 ENCOUNTER — Inpatient Hospital Stay

## 2024-08-24 ENCOUNTER — Encounter: Payer: Self-pay | Admitting: Hematology

## 2024-08-24 ENCOUNTER — Inpatient Hospital Stay: Attending: Hematology

## 2024-08-24 VITALS — BP 146/69 | HR 73 | Temp 98.0°F | Resp 17

## 2024-08-24 DIAGNOSIS — C9000 Multiple myeloma not having achieved remission: Secondary | ICD-10-CM | POA: Diagnosis not present

## 2024-08-24 DIAGNOSIS — Z79620 Long term (current) use of immunosuppressive biologic: Secondary | ICD-10-CM | POA: Diagnosis not present

## 2024-08-24 DIAGNOSIS — Z5112 Encounter for antineoplastic immunotherapy: Secondary | ICD-10-CM | POA: Insufficient documentation

## 2024-08-24 DIAGNOSIS — Z79899 Other long term (current) drug therapy: Secondary | ICD-10-CM | POA: Diagnosis not present

## 2024-08-24 LAB — CBC WITH DIFFERENTIAL (CANCER CENTER ONLY)
Abs Immature Granulocytes: 0.04 K/uL (ref 0.00–0.07)
Basophils Absolute: 0 K/uL (ref 0.0–0.1)
Basophils Relative: 0 %
Eosinophils Absolute: 0.1 K/uL (ref 0.0–0.5)
Eosinophils Relative: 1 %
HCT: 36.3 % (ref 36.0–46.0)
Hemoglobin: 12.2 g/dL (ref 12.0–15.0)
Immature Granulocytes: 1 %
Lymphocytes Relative: 17 %
Lymphs Abs: 1.5 K/uL (ref 0.7–4.0)
MCH: 30.4 pg (ref 26.0–34.0)
MCHC: 33.6 g/dL (ref 30.0–36.0)
MCV: 90.5 fL (ref 80.0–100.0)
Monocytes Absolute: 0.7 K/uL (ref 0.1–1.0)
Monocytes Relative: 8 %
Neutro Abs: 6.5 K/uL (ref 1.7–7.7)
Neutrophils Relative %: 73 %
Platelet Count: 200 K/uL (ref 150–400)
RBC: 4.01 MIL/uL (ref 3.87–5.11)
RDW: 12.8 % (ref 11.5–15.5)
WBC Count: 8.9 K/uL (ref 4.0–10.5)
nRBC: 0 % (ref 0.0–0.2)

## 2024-08-24 LAB — CMP (CANCER CENTER ONLY)
ALT: 56 U/L — ABNORMAL HIGH (ref 0–44)
AST: 31 U/L (ref 15–41)
Albumin: 3.8 g/dL (ref 3.5–5.0)
Alkaline Phosphatase: 49 U/L (ref 38–126)
Anion gap: 6 (ref 5–15)
BUN: 54 mg/dL — ABNORMAL HIGH (ref 8–23)
CO2: 27 mmol/L (ref 22–32)
Calcium: 9.1 mg/dL (ref 8.9–10.3)
Chloride: 105 mmol/L (ref 98–111)
Creatinine: 1.14 mg/dL — ABNORMAL HIGH (ref 0.44–1.00)
GFR, Estimated: 50 mL/min — ABNORMAL LOW (ref >60)
Glucose, Bld: 117 mg/dL — ABNORMAL HIGH (ref 70–99)
Potassium: 4 mmol/L (ref 3.5–5.1)
Sodium: 138 mmol/L (ref 135–145)
Total Bilirubin: 0.4 mg/dL (ref 0.0–1.2)
Total Protein: 6.3 g/dL — ABNORMAL LOW (ref 6.5–8.1)

## 2024-08-24 MED ORDER — BORTEZOMIB CHEMO SQ INJECTION 3.5 MG (2.5MG/ML)
1.5000 mg/m2 | Freq: Once | INTRAMUSCULAR | Status: AC
  Administered 2024-08-24: 2.25 mg via SUBCUTANEOUS
  Filled 2024-08-24: qty 0.9

## 2024-08-24 MED ORDER — DEXAMETHASONE 4 MG PO TABS
20.0000 mg | ORAL_TABLET | Freq: Once | ORAL | Status: AC
  Administered 2024-08-24: 20 mg via ORAL
  Filled 2024-08-24: qty 5

## 2024-08-24 NOTE — Patient Instructions (Signed)
 [facility] - A DEPT OF [facility]   Discharge Instructions: Thank you for choosing [facility] to provide your oncology and hematology care.   If you have a lab appointment with the Cancer Center, please go directly to the Cancer Center and check in at the registration area.   Wear comfortable clothing and clothing appropriate for easy access to any Portacath or PICC line.   We strive to give you quality time with your provider. You may need to reschedule your appointment if you arrive late (15 or more minutes).  Arriving late affects you and other patients whose appointments are after yours.  Also, if you miss three or more appointments without notifying the office, you may be dismissed from the clinic at the provider's discretion.      For prescription refill requests, have your pharmacy contact our office and allow 72 hours for refills to be completed.    Today you received the following chemotherapy and/or immunotherapy agents: Bortezomib (Velcade)      To help prevent nausea and vomiting after your treatment, we encourage you to take your nausea medication as directed.  BELOW ARE SYMPTOMS THAT SHOULD BE REPORTED IMMEDIATELY: *FEVER GREATER THAN 100.4 F (38 C) OR HIGHER *CHILLS OR SWEATING *NAUSEA AND VOMITING THAT IS NOT CONTROLLED WITH YOUR NAUSEA MEDICATION *UNUSUAL SHORTNESS OF BREATH *UNUSUAL BRUISING OR BLEEDING *URINARY PROBLEMS (pain or burning when urinating, or frequent urination) *BOWEL PROBLEMS (unusual diarrhea, constipation, pain near the anus) TENDERNESS IN MOUTH AND THROAT WITH OR WITHOUT PRESENCE OF ULCERS (sore throat, sores in mouth, or a toothache) UNUSUAL RASH, SWELLING OR PAIN  UNUSUAL VAGINAL DISCHARGE OR ITCHING   Items with * indicate a potential emergency and should be followed up as soon as possible or go to the Emergency Department if any problems should occur.  Please show the CHEMOTHERAPY ALERT CARD or  IMMUNOTHERAPY ALERT CARD at check-in to the Emergency Department and triage nurse.  Should you have questions after your visit or need to cancel or reschedule your appointment, please contact [facility] - A DEPT OF [facility]  Dept: [phone]  and follow the prompts.  Office hours are 8:00 a.m. to 4:30 p.m. Monday - Friday. Please note that voicemails left after 4:00 p.m. may not be returned until the following business day.  We are closed weekends and major holidays. You have access to a nurse at all times for urgent questions. Please call the main number to the clinic Dept: [phone] and follow the prompts.   For any non-urgent questions, you may also contact your provider using MyChart. We now offer e-Visits for anyone 75 and older to request care online for non-urgent symptoms. For details visit [email or web address].   Also download the MyChart app! Go to the app store, search "MyChart", open the app, select , and log in with your MyChart username and password.

## 2024-08-25 NOTE — Telephone Encounter (Signed)
 PGY2 Oncology Pharmacy Resident Encounter - Oral Chemo Initial Patient Education  I spoke with patient and patient's husband by phone for overview of new oral chemotherapy medication: Revlimid  (lenalidomide ) for the treatment of multiple myeloma in conjunction with daratumumab , velcade  and dexamethasone , planned duration until disease progression or unacceptable toxicity.   Planned Start Date: 08/26/2024  Treatment regimen: Take 1 capsule (15 mg) by mouth daily for 21 days, the 7 days off. Repeat every 28 days.  Provider ok with no renal dose adjustment due to residual disease.  Treatment intent: Control  Patient Education Counseled patient and husband on administration, dosing, side effects, monitoring, drug-food interactions, safe handling, storage, and disposal. Reviewed with patient importance of keeping a medication schedule and plan for any missed doses. Supportive care and prophylaxis needs were discussed (if applicable).  Relevant drug-drug interactions were discussed (if applicable).  Advise effects include but are not limited to:  Blood Counts Decreased white cell counts (neutropenia): Infection risk. Call the clinic at [phone] for fever > 100.4 F.  Decreased platelets (thrombocytopenia): bleeding risk. Could notice increased bruising and increased clotting time. Call the clinical at [phone] before any planned medical or dental procedures. Seek emergency care for any uncontrollable bleeding. Decreased red blood cells (anemia): may cause fatigue, reduced energy levels, shortness of breath, dizziness. Report to the provider if fatigue is so severe it hinders participation in normal physical activities. Labs will be routinely monitored during clinic visits   Diarrhea Common and may be persistent  May use OTC loperamide (Imodium) per package instructions Stay hydrated and eat small, bland meals as able  Call the clinic at [phone] if experiencing over 4 loose stools  per day above normal baseline   Rash May present as redness, itching or bumps  Use gentle skin care such as fragrance-free moisturizer and avoid sun or heat exposure as much as possible  Call the clinic at [phone]  if the rash continues to worsens as this may required treatment interruption or the use of a steroid cream  Nausea/Vomiting Anti-emetic medication already prescribed and patient has it in hand: Yes Let your provider know if you experience any uncontrollable nausea or vomiting   VTE Ppx Monitor for throbbing pain or swelling in legs or arms, and for sudden onset of feeling breathless of coughing  Confirmed the patient is taking aspirin  81 mg or will start to take: Yes  Patient Understanding and Adherence After discussion with patient no patient barriers to medication adherence identified.  Patient voiced understanding and appreciation.  All questions answered. Medication handout mailed.  Communication and Learning Assessment Primary learner: Cy CLEMENTEEN [place] and husband Barriers to learning: No barriers Preferred language: English Learning preferences: Listening Reading  Evaluation of Patient Distress Distress thermometer not completed during telephone call as patient has been on previous lines of IV chemotherapy.   Follow-up and Contact Patient and husband knows when and how to call the office with questions or concerns about non-urgent side effects.  Provided patient with Oral Chemotherapy Navigation Clinic phone number.  Oral Chemotherapy Navigation Clinic will continue to follow.  Thank you for allowing pharmacy to participate in this patient's care.  Alfonso MARLA Buys, PharmD Pharmacy Resident  08/25/2024 12:35 PM

## 2024-08-26 ENCOUNTER — Encounter: Payer: Self-pay | Admitting: Hematology

## 2024-08-26 ENCOUNTER — Other Ambulatory Visit: Payer: Self-pay

## 2024-08-26 ENCOUNTER — Telehealth: Payer: Self-pay

## 2024-08-26 DIAGNOSIS — Z23 Encounter for immunization: Secondary | ICD-10-CM | POA: Diagnosis not present

## 2024-08-26 NOTE — Telephone Encounter (Signed)
 Nutrition Follow-up:  Husband called requesting diet information for wife's kidney disease and cancer.    Patient with multiple myeloma.  Receiving DaracyBorD and started revlimid  today.    Spoke with wife and husband by phone.  Patient appetite is good and does not currently have any nutrition impact symptoms.  Wife currently drinking ensure max protein 2-4 times a day.  Eating breakfast usually bacon daily, cereal or bagel or egg, or cheese toast.  Lunch is bigger meal of grilled meats, vegetables.  Dinner is lighter meal with sandwich or cheese crackers.  Wife reports that kidney MD wants her to drink 6, 16 oz bottles of water daily.  If she drinks 1 ensure shakes can count that as 1 of bottles of water.      Medications: reviewed  Labs: K normal, Na normal, BUN 54, creatinine 1.44.  Phosphorus not known  Anthropometrics:   Weight 125 lb 4.8 oz on 8/27  118 lb on 5/6 last seen by RD   Estimated Energy Needs  Kcals: 1400-1680 Protein: 45-57 g/d (0.8-1.0 g moderate protein with kidney concern) Fluid: per MD  NUTRITION DIAGNOSIS: Food and nutrition related knowledge deficit related to kidney concern and cancer as evidenced by request for information from RD.   INTERVENTION:  Recommend limiting sodium intake to protect kidney.  Will mail Chronic Kidney Disease stage 3-5 Nutrition Therapy handout and Sodium Free Flavoring tips.   Could consider lower protein shake of glucerna shake (180 calories, 10 g protein, 4 g sugar) or boost glucose control (190 calories, 16 g protein, 4 g sugar) Contact information provided and patient will reach back out to nutrition team if needed     NEXT VISIT: no follow-up RD available as needed  Azayla Polo B. Dasie SOLON, CSO, LDN Registered Dietitian [phone]

## 2024-08-29 ENCOUNTER — Other Ambulatory Visit: Payer: Self-pay

## 2024-08-29 ENCOUNTER — Encounter: Payer: Self-pay | Admitting: Hematology

## 2024-08-30 ENCOUNTER — Other Ambulatory Visit: Payer: Self-pay

## 2024-08-30 DIAGNOSIS — C9000 Multiple myeloma not having achieved remission: Secondary | ICD-10-CM

## 2024-08-31 ENCOUNTER — Inpatient Hospital Stay

## 2024-08-31 ENCOUNTER — Other Ambulatory Visit: Payer: Self-pay | Admitting: Hematology

## 2024-08-31 ENCOUNTER — Encounter: Payer: Self-pay | Admitting: Hematology

## 2024-08-31 VITALS — BP 154/69 | HR 74 | Temp 98.0°F | Resp 16 | Wt 126.0 lb

## 2024-08-31 DIAGNOSIS — C9000 Multiple myeloma not having achieved remission: Secondary | ICD-10-CM

## 2024-08-31 DIAGNOSIS — Z79620 Long term (current) use of immunosuppressive biologic: Secondary | ICD-10-CM | POA: Diagnosis not present

## 2024-08-31 DIAGNOSIS — Z79899 Other long term (current) drug therapy: Secondary | ICD-10-CM | POA: Diagnosis not present

## 2024-08-31 DIAGNOSIS — Z5112 Encounter for antineoplastic immunotherapy: Secondary | ICD-10-CM | POA: Diagnosis not present

## 2024-08-31 LAB — CBC WITH DIFFERENTIAL (CANCER CENTER ONLY)
Abs Immature Granulocytes: 0.05 K/uL (ref 0.00–0.07)
Basophils Absolute: 0 K/uL (ref 0.0–0.1)
Basophils Relative: 0 %
Eosinophils Absolute: 0.2 K/uL (ref 0.0–0.5)
Eosinophils Relative: 2 %
HCT: 36.2 % (ref 36.0–46.0)
Hemoglobin: 12.2 g/dL (ref 12.0–15.0)
Immature Granulocytes: 1 %
Lymphocytes Relative: 13 %
Lymphs Abs: 1.2 K/uL (ref 0.7–4.0)
MCH: 30.8 pg (ref 26.0–34.0)
MCHC: 33.7 g/dL (ref 30.0–36.0)
MCV: 91.4 fL (ref 80.0–100.0)
Monocytes Absolute: 0.5 K/uL (ref 0.1–1.0)
Monocytes Relative: 5 %
Neutro Abs: 7.5 K/uL (ref 1.7–7.7)
Neutrophils Relative %: 79 %
Platelet Count: 166 K/uL (ref 150–400)
RBC: 3.96 MIL/uL (ref 3.87–5.11)
RDW: 12.8 % (ref 11.5–15.5)
WBC Count: 9.5 K/uL (ref 4.0–10.5)
nRBC: 0 % (ref 0.0–0.2)

## 2024-08-31 LAB — CMP (CANCER CENTER ONLY)
ALT: 22 U/L (ref 0–44)
AST: 16 U/L (ref 15–41)
Albumin: 4 g/dL (ref 3.5–5.0)
Alkaline Phosphatase: 56 U/L (ref 38–126)
Anion gap: 7 (ref 5–15)
BUN: 40 mg/dL — ABNORMAL HIGH (ref 8–23)
CO2: 27 mmol/L (ref 22–32)
Calcium: 9 mg/dL (ref 8.9–10.3)
Chloride: 102 mmol/L (ref 98–111)
Creatinine: 1.28 mg/dL — ABNORMAL HIGH (ref 0.44–1.00)
GFR, Estimated: 43 mL/min — ABNORMAL LOW (ref >60)
Glucose, Bld: 175 mg/dL — ABNORMAL HIGH (ref 70–99)
Potassium: 4 mmol/L (ref 3.5–5.1)
Sodium: 136 mmol/L (ref 135–145)
Total Bilirubin: 0.4 mg/dL (ref 0.0–1.2)
Total Protein: 6.2 g/dL — ABNORMAL LOW (ref 6.5–8.1)

## 2024-08-31 MED ORDER — DEXAMETHASONE 4 MG PO TABS
12.0000 mg | ORAL_TABLET | Freq: Once | ORAL | Status: AC
  Administered 2024-08-31: 12 mg via ORAL
  Filled 2024-08-31: qty 3

## 2024-08-31 MED ORDER — DARATUMUMAB-HYALURONIDASE-FIHJ 1800-30000 MG-UT/15ML ~~LOC~~ SOLN
1800.0000 mg | Freq: Once | SUBCUTANEOUS | Status: AC
  Administered 2024-08-31: 1800 mg via SUBCUTANEOUS
  Filled 2024-08-31: qty 15

## 2024-08-31 MED ORDER — ACETAMINOPHEN 325 MG PO TABS
650.0000 mg | ORAL_TABLET | Freq: Once | ORAL | Status: AC
  Administered 2024-08-31: 650 mg via ORAL
  Filled 2024-08-31: qty 2

## 2024-08-31 MED ORDER — DIPHENHYDRAMINE HCL 25 MG PO CAPS
25.0000 mg | ORAL_CAPSULE | Freq: Once | ORAL | Status: AC
  Administered 2024-08-31: 25 mg via ORAL
  Filled 2024-08-31: qty 1

## 2024-08-31 NOTE — Progress Notes (Signed)
 Pt reported rash on her lower abdomen, upper thighs, and back of neck. MD aware. MD advised for pt to pause Revlimid , begin taking 10mg  cetirizine, once daily and pepcid 20mg , twice daily. If rash resolves in 24 hours, ok to restart Revlimid . Notified pt, pt voiced understanding. Pt knows to call with any questions.

## 2024-08-31 NOTE — Patient Instructions (Signed)
 [facility] - A DEPT OF [facility]  Discharge Instructions: Thank you for choosing [facility] to provide your oncology and hematology care.   If you have a lab appointment with the Cancer Center, please go directly to the Cancer Center and check in at the registration area.   Wear comfortable clothing and clothing appropriate for easy access to any Portacath or PICC line.   We strive to give you quality time with your provider. You may need to reschedule your appointment if you arrive late (15 or more minutes).  Arriving late affects you and other patients whose appointments are after yours.  Also, if you miss three or more appointments without notifying the office, you may be dismissed from the clinic at the provider's discretion.      For prescription refill requests, have your pharmacy contact our office and allow 72 hours for refills to be completed.    Today you received the following chemotherapy and/or immunotherapy agents: daratumumab-hyaluronidase-fihj (DARZALEX FASPRO       To help prevent nausea and vomiting after your treatment, we encourage you to take your nausea medication as directed.  BELOW ARE SYMPTOMS THAT SHOULD BE REPORTED IMMEDIATELY: *FEVER GREATER THAN 100.4 F (38 C) OR HIGHER *CHILLS OR SWEATING *NAUSEA AND VOMITING THAT IS NOT CONTROLLED WITH YOUR NAUSEA MEDICATION *UNUSUAL SHORTNESS OF BREATH *UNUSUAL BRUISING OR BLEEDING *URINARY PROBLEMS (pain or burning when urinating, or frequent urination) *BOWEL PROBLEMS (unusual diarrhea, constipation, pain near the anus) TENDERNESS IN MOUTH AND THROAT WITH OR WITHOUT PRESENCE OF ULCERS (sore throat, sores in mouth, or a toothache) UNUSUAL RASH, SWELLING OR PAIN  UNUSUAL VAGINAL DISCHARGE OR ITCHING   Items with * indicate a potential emergency and should be followed up as soon as possible or go to the Emergency Department if any problems should occur.  Please show the  CHEMOTHERAPY ALERT CARD or IMMUNOTHERAPY ALERT CARD at check-in to the Emergency Department and triage nurse.  Should you have questions after your visit or need to cancel or reschedule your appointment, please contact [facility] - A DEPT OF [facility]  Dept: [phone]  and follow the prompts.  Office hours are 8:00 a.m. to 4:30 p.m. Monday - Friday. Please note that voicemails left after 4:00 p.m. may not be returned until the following business day.  We are closed weekends and major holidays. You have access to a nurse at all times for urgent questions. Please call the main number to the clinic Dept: [phone] and follow the prompts.   For any non-urgent questions, you may also contact your provider using MyChart. We now offer e-Visits for anyone 63 and older to request care online for non-urgent symptoms. For details visit [email or web address].   Also download the MyChart app! Go to the app store, search "MyChart", open the app, select [facility], and log in with your MyChart username and password.

## 2024-09-01 ENCOUNTER — Other Ambulatory Visit: Payer: Self-pay

## 2024-09-15 ENCOUNTER — Inpatient Hospital Stay

## 2024-09-15 ENCOUNTER — Inpatient Hospital Stay (HOSPITAL_BASED_OUTPATIENT_CLINIC_OR_DEPARTMENT_OTHER): Admitting: Hematology

## 2024-09-15 VITALS — BP 131/59 | HR 65 | Temp 98.1°F | Resp 18 | Wt 126.0 lb

## 2024-09-15 DIAGNOSIS — Z79899 Other long term (current) drug therapy: Secondary | ICD-10-CM | POA: Diagnosis not present

## 2024-09-15 DIAGNOSIS — R21 Rash and other nonspecific skin eruption: Secondary | ICD-10-CM | POA: Diagnosis not present

## 2024-09-15 DIAGNOSIS — C9000 Multiple myeloma not having achieved remission: Secondary | ICD-10-CM

## 2024-09-15 DIAGNOSIS — Z5111 Encounter for antineoplastic chemotherapy: Secondary | ICD-10-CM

## 2024-09-15 DIAGNOSIS — Z5112 Encounter for antineoplastic immunotherapy: Secondary | ICD-10-CM | POA: Diagnosis not present

## 2024-09-15 DIAGNOSIS — Z79620 Long term (current) use of immunosuppressive biologic: Secondary | ICD-10-CM | POA: Diagnosis not present

## 2024-09-15 LAB — COMPREHENSIVE METABOLIC PANEL WITH GFR
ALT: 29 U/L (ref 0–44)
AST: 23 U/L (ref 15–41)
Albumin: 3.9 g/dL (ref 3.5–5.0)
Alkaline Phosphatase: 57 U/L (ref 38–126)
Anion gap: 5 (ref 5–15)
BUN: 47 mg/dL — ABNORMAL HIGH (ref 8–23)
CO2: 28 mmol/L (ref 22–32)
Calcium: 9 mg/dL (ref 8.9–10.3)
Chloride: 108 mmol/L (ref 98–111)
Creatinine, Ser: 1.38 mg/dL — ABNORMAL HIGH (ref 0.44–1.00)
GFR, Estimated: 40 mL/min — ABNORMAL LOW (ref >60)
Glucose, Bld: 149 mg/dL — ABNORMAL HIGH (ref 70–99)
Potassium: 3.9 mmol/L (ref 3.5–5.1)
Sodium: 141 mmol/L (ref 135–145)
Total Bilirubin: 0.4 mg/dL (ref 0.0–1.2)
Total Protein: 6.1 g/dL — ABNORMAL LOW (ref 6.5–8.1)

## 2024-09-15 LAB — CBC WITH DIFFERENTIAL (CANCER CENTER ONLY)
Abs Immature Granulocytes: 0.01 K/uL (ref 0.00–0.07)
Basophils Absolute: 0.1 K/uL (ref 0.0–0.1)
Basophils Relative: 1 %
Eosinophils Absolute: 0.2 K/uL (ref 0.0–0.5)
Eosinophils Relative: 3 %
HCT: 34.3 % — ABNORMAL LOW (ref 36.0–46.0)
Hemoglobin: 11.6 g/dL — ABNORMAL LOW (ref 12.0–15.0)
Immature Granulocytes: 0 %
Lymphocytes Relative: 23 %
Lymphs Abs: 1.3 K/uL (ref 0.7–4.0)
MCH: 31.1 pg (ref 26.0–34.0)
MCHC: 33.8 g/dL (ref 30.0–36.0)
MCV: 92 fL (ref 80.0–100.0)
Monocytes Absolute: 0.7 K/uL (ref 0.1–1.0)
Monocytes Relative: 11 %
Neutro Abs: 3.7 K/uL (ref 1.7–7.7)
Neutrophils Relative %: 62 %
Platelet Count: 193 K/uL (ref 150–400)
RBC: 3.73 MIL/uL — ABNORMAL LOW (ref 3.87–5.11)
RDW: 12.8 % (ref 11.5–15.5)
WBC Count: 6 K/uL (ref 4.0–10.5)
nRBC: 0 % (ref 0.0–0.2)

## 2024-09-15 MED ORDER — PREDNISONE 5 MG PO TABS: 5.0000 mg | ORAL_TABLET | Freq: Every day | ORAL | 0 refills | Status: DC

## 2024-09-15 MED ORDER — DARATUMUMAB-HYALURONIDASE-FIHJ 1800-30000 MG-UT/15ML ~~LOC~~ SOLN
1800.0000 mg | Freq: Once | SUBCUTANEOUS | Status: AC
  Administered 2024-09-15: 1800 mg via SUBCUTANEOUS
  Filled 2024-09-15: qty 15

## 2024-09-15 MED ORDER — ACETAMINOPHEN 325 MG PO TABS
650.0000 mg | ORAL_TABLET | Freq: Once | ORAL | Status: AC
  Administered 2024-09-15: 650 mg via ORAL
  Filled 2024-09-15: qty 2

## 2024-09-15 MED ORDER — DIPHENHYDRAMINE HCL 25 MG PO CAPS
25.0000 mg | ORAL_CAPSULE | Freq: Once | ORAL | Status: AC
  Administered 2024-09-15: 25 mg via ORAL
  Filled 2024-09-15: qty 1

## 2024-09-15 MED ORDER — DEXAMETHASONE 6 MG PO TABS
12.0000 mg | ORAL_TABLET | Freq: Once | ORAL | Status: AC
  Administered 2024-09-15: 12 mg via ORAL
  Filled 2024-09-15: qty 2

## 2024-09-15 NOTE — Patient Instructions (Signed)

## 2024-09-16 LAB — KAPPA/LAMBDA LIGHT CHAINS
Kappa free light chain: 5.8 mg/L (ref 3.3–19.4)
Kappa, lambda light chain ratio: 0.89 (ref 0.26–1.65)
Lambda free light chains: 6.5 mg/L (ref 5.7–26.3)

## 2024-09-19 LAB — MULTIPLE MYELOMA PANEL, SERUM
Albumin SerPl Elph-Mcnc: 3.4 g/dL (ref 2.9–4.4)
Albumin/Glob SerPl: 1.5 (ref 0.7–1.7)
Alpha 1: 0.2 g/dL (ref 0.0–0.4)
Alpha2 Glob SerPl Elph-Mcnc: 0.7 g/dL (ref 0.4–1.0)
B-Globulin SerPl Elph-Mcnc: 0.8 g/dL (ref 0.7–1.3)
Gamma Glob SerPl Elph-Mcnc: 0.6 g/dL (ref 0.4–1.8)
Globulin, Total: 2.3 g/dL (ref 2.2–3.9)
IgA: 6 mg/dL — ABNORMAL LOW (ref 64–422)
IgG (Immunoglobin G), Serum: 678 mg/dL (ref 586–1602)
IgM (Immunoglobulin M), Srm: 19 mg/dL — ABNORMAL LOW (ref 26–217)
M Protein SerPl Elph-Mcnc: 0.4 g/dL — ABNORMAL HIGH
Total Protein ELP: 5.7 g/dL — ABNORMAL LOW (ref 6.0–8.5)

## 2024-09-21 ENCOUNTER — Other Ambulatory Visit: Payer: Self-pay | Admitting: Family Medicine

## 2024-09-21 DIAGNOSIS — Z1231 Encounter for screening mammogram for malignant neoplasm of breast: Secondary | ICD-10-CM

## 2024-09-23 ENCOUNTER — Other Ambulatory Visit: Payer: Self-pay

## 2024-09-23 DIAGNOSIS — C9000 Multiple myeloma not having achieved remission: Secondary | ICD-10-CM

## 2024-09-23 MED ORDER — LENALIDOMIDE 15 MG PO CAPS
15.0000 mg | ORAL_CAPSULE | Freq: Every day | ORAL | 0 refills | Status: DC
Start: 2024-09-23 — End: 2024-10-17

## 2024-09-27 ENCOUNTER — Other Ambulatory Visit: Payer: Self-pay

## 2024-09-27 ENCOUNTER — Other Ambulatory Visit: Payer: Self-pay | Admitting: Family Medicine

## 2024-09-27 ENCOUNTER — Encounter: Payer: Self-pay | Admitting: Hematology

## 2024-09-27 DIAGNOSIS — E1165 Type 2 diabetes mellitus with hyperglycemia: Secondary | ICD-10-CM

## 2024-09-27 DIAGNOSIS — E039 Hypothyroidism, unspecified: Secondary | ICD-10-CM

## 2024-09-27 NOTE — Progress Notes (Signed)
 HEMATOLOGY/ONCOLOGY CLINIC NOTE  Date of Service: .09/15/2024  Patient Care Team: Antonio Meth, Jamee SAUNDERS, DO as PCP - General Delford Maude BROCKS, MD as PCP - Cardiology (Cardiology) Rosan Credit, MD as Consulting Physician (Ophthalmology) Obie Princella HERO, MD (Inactive) (Gastroenterology) Vernetta Lonni GRADE, MD as Consulting Physician (Orthopedic Surgery) Ivin Kocher, MD as Referring Physician (Dermatology) Georgia Blonder, MD as Consulting Physician (Obstetrics and Gynecology) Onesimo Emaline Brink, MD as Consulting Physician (Hematology)  CHIEF COMPLAINTS/PURPOSE OF CONSULTATION:  Follow-up for continued evaluation and management of multiple myeloma  HISTORY OF PRESENTING ILLNESS:   Cynthia Burns is a wonderful 78 y.o. female who was scheduled to see Dr. Andriette as a new patient and was referred to the ED for evaluation and management of newly diagnosed myeloma with rapidly worsening hgb and renal insuffiencey with proteinuria. High suspicious of Multiple myeloma.    Outside labs done at Dr Ephriam Singh's office (available in referral under media) show M spike of 6.1g/dl and Kappa light chains in the 400's.   Patient is accompanied by her husband and her daughter at bed side during the visit. Patient notes she has been having significantly more fatigue. Has required PRBC transfusions for symptomatic anemia.   She complains of fatigue, right ankle pain, and unexpected weight loss of around 25-30 lbs due to appetite loss in the past year. Her daughter notes that the patient has been confused more often as well.    She denies any new infection issues, fever, chills, back pain, chest pain, abdominal pain, or leg swelling.    Patient notes she tested positive for COVID-19 infection in August 2022 and was prescribed Paxlovid . However, she did not tolerate the medication due to allergic reaction.   PmHx of hypertension. She has been taking Losartan  and hydralazine .    Surgery history: Hysterectomy and Cholecystectomy.    She was started on dexamethasone  20 mg daily x 4 doses yesterday. She has been tolerating the high-dose steroid well.   INTERVAL HISTORY:  Cynthia Burns is a 78 y.o. female who is here for continued evaluation and management of multiple myeloma. She is accompanied by her husband in person and daughter on the phone. She notes no acute new symptoms. No focal new bone pains.  No fevers no chills no night sweats. No new tingling or numbness in her hands or feet. She noted a mild rash with her Revlimid  with her previous cycle and we discussed that we will start her with low-dose prednisone  H1 and H2 blockers to help prevent this upon restarting her Revlimid . All the available labs and treatment plan were discussed in details.  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   HSV-1 infection    HYPERTENSION    Hypothyroid    MITRAL VALVE PROLAPSE    OSTEOPENIA    Overweight(278.02)    PHARYNGITIS, ACUTE     SURGICAL HISTORY: Past Surgical History:  Procedure Laterality Date   ABDOMINAL HYSTERECTOMY     CHOLECYSTECTOMY     prolped bladder     WISDOM TOOTH EXTRACTION      SOCIAL HISTORY: Social History   Socioeconomic History  Marital status: Married    Spouse name: Not on file   Number of children: Not on file   Years of education: Not on file   Highest education level: Associate degree: occupational, Scientist, product/process development, or vocational program  Occupational History   Occupation: hairdresser  Tobacco Use   Smoking status: Never   Smokeless tobacco: Never  Vaping Use   Vaping status: Never Used  Substance and Sexual Activity   Alcohol use: No     Alcohol/week: 0.0 standard drinks of alcohol   Drug use: No   Sexual activity: Not Currently    Partners: Male    Birth control/protection: Surgical, Abstinence    Comment: hysterectomy, 17, less than 5  Other Topics Concern   Not on file  Social History Narrative   Not on file   Social Drivers of Health   Financial Resource Strain: Low Risk  (12/07/2023)   Overall Financial Resource Strain (CARDIA)    Difficulty of Paying Living Expenses: Not hard at all  Food Insecurity: Patient Declined (02/29/2024)   Hunger Vital Sign    Worried About Running Out of Food in the Last Year: Patient declined    Ran Out of Food in the Last Year: Patient declined  Transportation Needs: No Transportation Needs (02/29/2024)   PRAPARE - Administrator, Civil Service (Medical): No    Lack of Transportation (Non-Medical): No  Physical Activity: Insufficiently Active (12/07/2023)   Exercise Vital Sign    Days of Exercise per Week: 1 day    Minutes of Exercise per Session: 10 min  Stress: No Stress Concern Present (12/07/2023)   [employer] of Occupational Health - Occupational Stress Questionnaire    Feeling of Stress : Only a little  Social Connections: Socially Integrated (02/29/2024)   Social Connection and Isolation Panel    Frequency of Communication with Friends and Family: More than three times a week    Frequency of Social Gatherings with Friends and Family: More than three times a week    Attends Religious Services: More than 4 times per year    Active Member of [employer] or Organizations: Yes    Attends Engineer, structural: More than 4 times per year    Marital Status: Married  Catering manager Violence: Not At Risk (02/29/2024)   Humiliation, Afraid, Rape, and Kick questionnaire    Fear of Current or Ex-Partner: No    Emotionally Abused: No    Physically Abused: No    Sexually Abused: No    FAMILY HISTORY: Family History  Problem Relation Age of Onset    Hypertension Mother    Heart disease Mother        Had a stent   Emphysema Father    Diabetes Father    COPD Father    Stroke Sister    Breast cancer Neg Hx     ALLERGIES:  is allergic to mucinex [guaifenesin er], bystolic  [nebivolol  hcl], calcium -containing compounds, codeine, paxlovid  [nirmatrelvir -ritonavir ], advicor [niacin-lovastatin er], amlodipine, lisinopril-hydrochlorothiazide, other, ramipril, and sulfonamide derivatives.  MEDICATIONS:  Current Outpatient Medications  Medication Sig Dispense Refill   predniSONE  (DELTASONE ) 5 MG tablet Take 1 tablet (5 mg total) by mouth daily with breakfast. 30 tablet 0   acyclovir  (ZOVIRAX ) 400 MG tablet Take 1 tablet (400 mg total) by mouth 2 (two) times daily. 60 tablet 5   amoxicillin  (AMOXIL ) 500 MG capsule Take 2,000 mg by mouth See admin instructions. Take 2,000 mg by mouth one hour prior to dental visits (Patient  not taking: Reported on 07/20/2024)     aspirin  EC 81 MG tablet Take 81 mg by mouth daily. Swallow whole.     Biotin 5000 MCG CAPS Take 5,000 mcg by mouth daily.     Blood Glucose Monitoring Suppl (ONE TOUCH ULTRA 2) w/Device KIT CHECK BLOOD SUGAR TWICE DAILY.  DX CODE  E11.9 1 kit 0   Cholecalciferol (VITAMIN D3) 1000 units CAPS Take 1,000 Units by mouth daily.     clonazePAM  (KLONOPIN ) 0.5 MG tablet Take 1 tablet (0.5 mg total) by mouth 2 (two) times daily as needed for anxiety. 30 tablet 0   Cyanocobalamin  (VITAMIN B-12) 3000 MCG SUBL Place 3,000 mcg under the tongue daily in the afternoon.     dexamethasone  (DECADRON ) 4 MG tablet TAKE FIVE TABLETS BY MOUTH EVERY DAY WITH BREAKFAST THE DAY AFTER EVERY DARATUMUMAB  DOSE 30 tablet 1   fexofenadine (ALLEGRA) 180 MG tablet Take 180 mg by mouth daily.     fexofenadine-pseudoephedrine (ALLEGRA-D 24) 180-240 MG 24 hr tablet Take 1 tablet by mouth daily.     furosemide  (LASIX ) 20 MG tablet TAKE 1 TABLET BY MOUTH EVERY DAY (Patient not taking: Reported on 07/20/2024) 90 tablet 1    glucose blood (ONETOUCH ULTRA TEST) test strip USE ONE STRIP TO TEST AS DIRECTED - (KEEP UNUSED STRIPS IN ORIGINAL SEALED CONTAINER BETWEEN USES) 200 each 3   [Paused] hydrALAZINE  (APRESOLINE ) 25 MG tablet Take 25 mg by mouth See admin instructions. Take 25 mg by mouth at 8 AM, 3 PM, and 11 PM- in conjunction with one 50 mg tablet to equal a total dose of 75 mg     insulin  aspart (NOVOLOG  FLEXPEN) 100 UNIT/ML FlexPen Per sliding scale max 40 u daily 15 mL 3   Insulin  Pen Needle (B-D UF III MINI PEN NEEDLES) 31G X 5 MM MISC To use w/ Novolog  100 each 2   Lancets (ONETOUCH DELICA PLUS LANCET33G) MISC USE 1 LANCET TO TEST AS DIRECTED (DISCARD LANCET IN SHARPS CONTAINER IMMEDIATELY AFTER USE) 200 each 3   lenalidomide  (REVLIMID ) 15 MG capsule Take 1 capsule (15 mg total) by mouth daily. Take 1 capsule (15 mg total) by mouth daily for 21 days and then take 7 days off. 21 capsule 0   levothyroxine  (SYNTHROID ) 25 MCG tablet TAKE ONE TABLET BY MOUTH EVERY DAY BEFORE BREAKFAST 90 tablet 0   Multiple Vitamins-Minerals (PRESERVISION AREDS 2) CAPS Take 1 capsule by mouth 2 (two) times daily with a meal.     ondansetron  (ZOFRAN ) 8 MG tablet Take 8 mg by mouth 30 to 60 min prior to Cyclophosphamide administration then take 8 mg every 8 hrs as needed for nausea and vomiting. 30 tablet 1   pantoprazole  (PROTONIX ) 40 MG tablet Pantoprazole  40 mg before breakfast and dinner for 90 days with 3 refills (Patient taking differently: Take 40 mg by mouth See admin instructions. Take 40 mg by mouth 30 minutes before lunch and supper) 180 tablet 3   prochlorperazine  (COMPAZINE ) 10 MG tablet Take 1 tablet (10 mg total) by mouth every 6 (six) hours as needed for nausea or vomiting. 30 tablet 1   rosuvastatin  (CRESTOR ) 20 MG tablet TAKE ONE TABLET BY MOUTH EVERY DAY 90 tablet 3   RYBELSUS  7 MG TABS TAKE ONE TABLET BY MOUTH EVERY DAY - (TAKE WITH NO MORE THAN 4 OUNCES OF PLAIN WATER AT LEAST 30 MINUTES BEFORE THE FIRST FOOD,  BEVERAGE, OR OTHER MEDICATIONS OF THE DAY) 90 tablet 0   SYSTANE  ULTRA PF 0.4-0.3 % SOLN Place 1 drop into both eyes See admin instructions. Instill 1 drop into both eyes one to four times a day     No current facility-administered medications for this visit.    REVIEW OF SYSTEMS:   10 Point review of Systems was done is negative except as noted above.  PHYSICAL EXAMINATION:  ECOG PERFORMANCE STATUS: 2 - Symptomatic, <50% confined to bed VSS GENERAL:alert, in no acute distress and comfortable SKIN: no acute rashes, no significant lesions EYES: conjunctiva are pink and non-injected, sclera anicteric OROPHARYNX: MMM, no exudates, no oropharyngeal erythema or ulceration NECK: supple, no JVD LYMPH:  no palpable lymphadenopathy in the cervical, axillary or inguinal regions LUNGS: clear to auscultation b/l with normal respiratory effort HEART: regular rate & rhythm ABDOMEN:  normoactive bowel sounds , non tender, not distended. Extremity: no pedal edema PSYCH: alert & oriented x 3 with fluent speech NEURO: no focal motor/sensory deficits    LABORATORY DATA:  I have reviewed the data as listed  .    Latest Ref Rng & Units 09/15/2024    9:53 AM 08/31/2024    7:46 AM 08/24/2024    7:55 AM  CBC  WBC 4.0 - 10.5 K/uL 6.0  9.5  8.9   Hemoglobin 12.0 - 15.0 g/dL 88.3  87.7  87.7   Hematocrit 36.0 - 46.0 % 34.3  36.2  36.3   Platelets 150 - 400 K/uL 193  166  200        Latest Ref Rng & Units 09/15/2024    9:53 AM 08/31/2024    7:46 AM 08/24/2024    7:55 AM  CMP  Glucose 70 - 99 mg/dL 850  824  882   BUN 8 - 23 mg/dL 47  40  54   Creatinine 0.44 - 1.00 mg/dL 8.61  8.71  8.85   Sodium 135 - 145 mmol/L 141  136  138   Potassium 3.5 - 5.1 mmol/L 3.9  4.0  4.0   Chloride 98 - 111 mmol/L 108  102  105   CO2 22 - 32 mmol/L 28  27  27    Calcium  8.9 - 10.3 mg/dL 9.0  9.0  9.1   Total Protein 6.5 - 8.1 g/dL 6.1  6.2  6.3   Total Bilirubin 0.0 - 1.2 mg/dL 0.4  0.4  0.4   Alkaline Phos 38 -  126 U/L 57  56  49   AST 15 - 41 U/L 23  16  31    ALT 0 - 44 U/L 29  22  56    Bone marrow biopsy 02/01/2024:    Surgical pathology 01/29/2024:    RADIOGRAPHIC STUDIES: I have personally reviewed the radiological images as listed and agreed with the findings in the report. No results found.   ASSESSMENT & PLAN:  78 y.o. female with:  Multiple myeloma - Lambda restricted plasma cell neoplasm comprising greater than 75% of  the cellular marrow  Molecular Cyto t(11;14) PET/CT with no bone lesions 2. Acute on CKD - resolved to baseline 3. HTN 4. DM2 5. Hypothyroidism  Patient Active Problem List   Diagnosis Date Noted   Symptomatic anemia 01/29/2024   Multiple myeloma (HCC) 01/29/2024   Acute renal failure superimposed on stage 3b chronic kidney disease (HCC) 01/29/2024   Anemia 11/26/2023   Hypothyroidism 02/23/2023   Type 2 diabetes mellitus with hyperglycemia, without long-term current use of insulin  (HCC) 07/26/2021   Impacted cerumen of right ear 07/06/2020  Type 2 diabetes mellitus with hyperglycemia (HCC) 04/11/2019   Hyperlipidemia LDL goal <70 03/23/2017   Abdominal pain, epigastric 09/03/2016   Strain of right biceps 03/17/2016   Right shoulder pain 03/17/2016   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   Plantar wart of right foot 03/15/2015   Uncontrolled type 2 diabetes mellitus with hyperglycemia (HCC) 09/16/2013   Elevated lipids 09/05/2013   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 Hyponatremia (from 02/23/2023) chronic. Recent thyroid  function tests WNL. Resolved. 7.  Grade 1 rash with Revlimid  PLAN: - Labs done today 09/15/2024 were discussed in detail with the patient. CBC shows stable hemoglobin of 11.6, WBC count of 6k platelets of 193k CMP shows stable chronic kidney disease with a creatinine of 1.38 calcium   is normal at 9 Myeloma panel shows an M spike of 0.4 g/dL of IgG lambda monoclonal protein Kappa lambda free light chains are within normal limits with normal ratio  Patient has a mild grade 1 macular rash from prior cycle of Revlimid .  The rash is now resolved and we are rechallenging her with Revlimid  under the use of H1 H2 blocker and low-dose prednisone  Proceed with daratumumab  Revlimid  and dexamethasone  we will plan to do a few more cycles to try to get the maximum response prior to switching to Dara Revlimid  maintenance. Recommended patient continue to drink at least 2 L of water a day Follow-up with PCP for optimization of hypertension and diabetes management and hypothyroidism treatment.  FOLLOW-UP: Continue daratumumab  as per integrated scheduling MD visit in 1 month  The total time spent in the appointment was 30 minutes*.  All of the patient's questions were answered with apparent satisfaction. The patient knows to call the clinic with any problems, questions or concerns.   Emaline Saran MD MS AAHIVMS [facility] Hematology/Oncology Physician [facility]  .*Total Encounter Time as defined by the Centers for Medicare and Medicaid Services includes, in addition to the face-to-face time of a patient visit (documented in the note above) non-face-to-face time: obtaining and reviewing outside history, ordering and reviewing medications, tests or procedures, care coordination (communications with other health care professionals or caregivers) and documentation in the medical record.

## 2024-09-28 ENCOUNTER — Inpatient Hospital Stay: Attending: Hematology

## 2024-09-28 ENCOUNTER — Inpatient Hospital Stay

## 2024-09-28 VITALS — BP 144/69 | HR 72 | Temp 97.8°F | Wt 126.8 lb

## 2024-09-28 DIAGNOSIS — Z5112 Encounter for antineoplastic immunotherapy: Secondary | ICD-10-CM | POA: Insufficient documentation

## 2024-09-28 DIAGNOSIS — C9000 Multiple myeloma not having achieved remission: Secondary | ICD-10-CM

## 2024-09-28 DIAGNOSIS — Z79620 Long term (current) use of immunosuppressive biologic: Secondary | ICD-10-CM | POA: Diagnosis not present

## 2024-09-28 LAB — CBC WITH DIFFERENTIAL (CANCER CENTER ONLY)
Abs Immature Granulocytes: 0.05 K/uL (ref 0.00–0.07)
Basophils Absolute: 0.1 K/uL (ref 0.0–0.1)
Basophils Relative: 0 %
Eosinophils Absolute: 0.3 K/uL (ref 0.0–0.5)
Eosinophils Relative: 2 %
HCT: 35.8 % — ABNORMAL LOW (ref 36.0–46.0)
Hemoglobin: 12.1 g/dL (ref 12.0–15.0)
Immature Granulocytes: 0 %
Lymphocytes Relative: 10 %
Lymphs Abs: 1.4 K/uL (ref 0.7–4.0)
MCH: 31.3 pg (ref 26.0–34.0)
MCHC: 33.8 g/dL (ref 30.0–36.0)
MCV: 92.7 fL (ref 80.0–100.0)
Monocytes Absolute: 0.9 K/uL (ref 0.1–1.0)
Monocytes Relative: 7 %
Neutro Abs: 11.1 K/uL — ABNORMAL HIGH (ref 1.7–7.7)
Neutrophils Relative %: 81 %
Platelet Count: 225 K/uL (ref 150–400)
RBC: 3.86 MIL/uL — ABNORMAL LOW (ref 3.87–5.11)
RDW: 13.2 % (ref 11.5–15.5)
WBC Count: 13.8 K/uL — ABNORMAL HIGH (ref 4.0–10.5)
nRBC: 0 % (ref 0.0–0.2)

## 2024-09-28 MED ORDER — DIPHENHYDRAMINE HCL 25 MG PO CAPS
25.0000 mg | ORAL_CAPSULE | Freq: Once | ORAL | Status: AC
  Administered 2024-09-28: 25 mg via ORAL
  Filled 2024-09-28: qty 1

## 2024-09-28 MED ORDER — DEXAMETHASONE 6 MG PO TABS
12.0000 mg | ORAL_TABLET | Freq: Once | ORAL | Status: AC
  Administered 2024-09-28: 12 mg via ORAL
  Filled 2024-09-28: qty 2

## 2024-09-28 MED ORDER — DARATUMUMAB-HYALURONIDASE-FIHJ 1800-30000 MG-UT/15ML ~~LOC~~ SOLN
1800.0000 mg | Freq: Once | SUBCUTANEOUS | Status: AC
  Administered 2024-09-28: 1800 mg via SUBCUTANEOUS
  Filled 2024-09-28: qty 15

## 2024-09-28 MED ORDER — ACETAMINOPHEN 325 MG PO TABS
650.0000 mg | ORAL_TABLET | Freq: Once | ORAL | Status: AC
  Administered 2024-09-28: 650 mg via ORAL
  Filled 2024-09-28: qty 2

## 2024-09-28 NOTE — Patient Instructions (Signed)

## 2024-10-12 ENCOUNTER — Encounter: Payer: Self-pay | Admitting: Hematology

## 2024-10-12 ENCOUNTER — Inpatient Hospital Stay

## 2024-10-12 ENCOUNTER — Inpatient Hospital Stay: Admitting: Hematology

## 2024-10-12 ENCOUNTER — Other Ambulatory Visit: Payer: Self-pay | Admitting: Hematology

## 2024-10-12 VITALS — BP 139/57 | HR 69 | Temp 97.3°F | Resp 18 | Wt 128.1 lb

## 2024-10-12 DIAGNOSIS — Z5112 Encounter for antineoplastic immunotherapy: Secondary | ICD-10-CM | POA: Diagnosis not present

## 2024-10-12 DIAGNOSIS — Z79620 Long term (current) use of immunosuppressive biologic: Secondary | ICD-10-CM | POA: Diagnosis not present

## 2024-10-12 DIAGNOSIS — C9000 Multiple myeloma not having achieved remission: Secondary | ICD-10-CM

## 2024-10-12 LAB — CBC WITH DIFFERENTIAL (CANCER CENTER ONLY)
Abs Immature Granulocytes: 0.03 K/uL (ref 0.00–0.07)
Basophils Absolute: 0.1 K/uL (ref 0.0–0.1)
Basophils Relative: 1 %
Eosinophils Absolute: 0.2 K/uL (ref 0.0–0.5)
Eosinophils Relative: 3 %
HCT: 31.9 % — ABNORMAL LOW (ref 36.0–46.0)
Hemoglobin: 10.9 g/dL — ABNORMAL LOW (ref 12.0–15.0)
Immature Granulocytes: 0 %
Lymphocytes Relative: 11 %
Lymphs Abs: 0.9 K/uL (ref 0.7–4.0)
MCH: 31.6 pg (ref 26.0–34.0)
MCHC: 34.2 g/dL (ref 30.0–36.0)
MCV: 92.5 fL (ref 80.0–100.0)
Monocytes Absolute: 0.8 K/uL (ref 0.1–1.0)
Monocytes Relative: 11 %
Neutro Abs: 5.8 K/uL (ref 1.7–7.7)
Neutrophils Relative %: 74 %
Platelet Count: 178 K/uL (ref 150–400)
RBC: 3.45 MIL/uL — ABNORMAL LOW (ref 3.87–5.11)
RDW: 12.4 % (ref 11.5–15.5)
WBC Count: 7.8 K/uL (ref 4.0–10.5)
nRBC: 0 % (ref 0.0–0.2)

## 2024-10-12 LAB — COMPREHENSIVE METABOLIC PANEL WITH GFR
ALT: 17 U/L (ref 0–44)
AST: 17 U/L (ref 15–41)
Albumin: 3.7 g/dL (ref 3.5–5.0)
Alkaline Phosphatase: 59 U/L (ref 38–126)
Anion gap: 6 (ref 5–15)
BUN: 46 mg/dL — ABNORMAL HIGH (ref 8–23)
CO2: 26 mmol/L (ref 22–32)
Calcium: 8.8 mg/dL — ABNORMAL LOW (ref 8.9–10.3)
Chloride: 107 mmol/L (ref 98–111)
Creatinine, Ser: 1.38 mg/dL — ABNORMAL HIGH (ref 0.44–1.00)
GFR, Estimated: 40 mL/min — ABNORMAL LOW (ref >60)
Glucose, Bld: 174 mg/dL — ABNORMAL HIGH (ref 70–99)
Potassium: 3.8 mmol/L (ref 3.5–5.1)
Sodium: 139 mmol/L (ref 135–145)
Total Bilirubin: 0.4 mg/dL (ref 0.0–1.2)
Total Protein: 5.8 g/dL — ABNORMAL LOW (ref 6.5–8.1)

## 2024-10-12 MED ORDER — ACETAMINOPHEN 325 MG PO TABS
650.0000 mg | ORAL_TABLET | Freq: Once | ORAL | Status: AC
  Administered 2024-10-12: 650 mg via ORAL
  Filled 2024-10-12: qty 2

## 2024-10-12 MED ORDER — DIPHENHYDRAMINE HCL 25 MG PO CAPS
25.0000 mg | ORAL_CAPSULE | Freq: Once | ORAL | Status: AC
  Administered 2024-10-12: 25 mg via ORAL
  Filled 2024-10-12: qty 1

## 2024-10-12 MED ORDER — DEXAMETHASONE 6 MG PO TABS
12.0000 mg | ORAL_TABLET | Freq: Once | ORAL | Status: AC
  Administered 2024-10-12: 12 mg via ORAL
  Filled 2024-10-12: qty 2

## 2024-10-12 MED ORDER — DARATUMUMAB-HYALURONIDASE-FIHJ 1800-30000 MG-UT/15ML ~~LOC~~ SOLN
1800.0000 mg | Freq: Once | SUBCUTANEOUS | Status: AC
  Administered 2024-10-12: 1800 mg via SUBCUTANEOUS
  Filled 2024-10-12: qty 15

## 2024-10-12 NOTE — Patient Instructions (Signed)

## 2024-10-12 NOTE — Progress Notes (Signed)
 HEMATOLOGY ONCOLOGY PROGRESS NOTE  Date of service: 10/12/2024  Patient Care Team: Antonio Meth, Jamee SAUNDERS, DO as PCP - General Delford Maude BROCKS, MD as PCP - Cardiology (Cardiology) Rosan Credit, MD as Consulting Physician (Ophthalmology) Obie Princella HERO, MD (Inactive) (Gastroenterology) Vernetta Lonni GRADE, MD as Consulting Physician (Orthopedic Surgery) Ivin Kocher, MD as Referring Physician (Dermatology) Georgia Blonder, MD as Consulting Physician (Obstetrics and Gynecology) Onesimo Emaline Brink, MD as Consulting Physician (Hematology)  CHIEF COMPLAINT/PURPOSE OF CONSULTATION: Follow-up for continued evaluation and management of multiple myeloma  HISTORY OF PRESENTING ILLNESS: (2/13/20215) Cynthia Burns is a wonderful 78 y.o. female who was scheduled to see Dr. Andriette as a new patient and was referred to the ED for evaluation and management of newly diagnosed myeloma with rapidly worsening hgb and renal insuffiencey with proteinuria. High suspicious of Multiple myeloma.    Outside labs done at Dr Ephriam Singh's office (available in referral under media) show M spike of 6.1g/dl and Kappa light chains in the 400's.   Patient is accompanied by her husband and her daughter at bed side during the visit. Patient notes she has been having significantly more fatigue. Has required PRBC transfusions for symptomatic anemia.   She complains of fatigue, right ankle pain, and unexpected weight loss of around 25-30 lbs due to appetite loss in the past year. Her daughter notes that the patient has been confused more often as well.    She denies any new infection issues, fever, chills, back pain, chest pain, abdominal pain, or leg swelling.    Patient notes she tested positive for COVID-19 infection in August 2022 and was prescribed Paxlovid . However, she did not tolerate the medication due to allergic reaction.   PmHx of hypertension. She has been taking Losartan  and  hydralazine .   Surgery history: Hysterectomy and Cholecystectomy.    She was started on dexamethasone  20 mg daily x 4 doses yesterday. She has been tolerating the high-dose steroid well.    Cynthia Burns is a 78 y.o. female here for evaluation and management of newly diagnosed myeloma with rapidly worsening hgb and renal insuffiencey with proteinuria.    Patient was seen by me in the ED on 01/30/2024 and complained of significantly worsened fatigue as well as right ankle pain, increased confusion, unexpected weight loss of about 25-30 pounds in 1 year due to appetite loss.   Today, she is accompanied by her husband and daughter. Her husband reports that patient has been feeling extremely weak since being discharged from the hospital. Patient reports no other new symptoms since her inpatient visit.    Patient's husband reports that patient was started on 0.5 MG Clonazepam  by Dr. Austria in the hospital for anxiety management. Her husband reports that Clonazepam  caused sedation, so it has been cut in half.    Patient's husband reports that her blood pressure generally fluctuates at home. Her blood pressure in clinic today is 161/74. She notes that she takes her blood pressure medication at lunch time. She reports one incident in which her blood pressure was over 200, though it has not been persistent at that level.   Patient reports that her baseline weight one year ago was 136 pounds. She did lose about 32 pounds recently, but she has gained back 12 pounds. Her current weight is 116 pounds.    She has not received the latest COVID-19 vaccine. Patient is otherwise UTD with her age-appropriate vaccines, including flu, RSV, shingles, and pneumonia.    She notes that  she was infected with COVID-19 3x previously.    Patient does endorse some lightheadedness. She denies any back pain or abdominal pain.    Pt reports a hx of prolapsed bladder, which is not sympomatically bothersome.    Patient  reports allergies to several medications.    She does stay regularly hydrated by consuming nearly 3L of water daily.    Patient will have a kidney biopsy on 02/18/2024.  She will see her kidney doctor next on 02/24/2024.    Patient reports that her PCP has started her on Rybelsus  for DM management. She reports that her next visit with her PCP is 02/25/2024.    SUMMARY OF ONCOLOGIC HISTORY: Oncology History  Multiple myeloma (HCC)  01/29/2024 Initial Diagnosis   Multiple myeloma (HCC)   02/23/2024 -  Chemotherapy   Patient is on Treatment Plan : MYELOMA DaraCyBorD (Daratumumab  SQ + Cyclophosphamide PO + Bortezomib  SQ + Dexamethasone  PO) q28d x 8 cycles / Daratumumab  SQ q28d     03/14/2024 Cancer Staging   Staging form: Plasma Cell Myeloma and Plasma Cell Disorders, AJCC 8th Edition - Clinical: High-risk cytogenetics: Absent - Signed by Onesimo Emaline Brink, MD on 03/14/2024 Stage prefix: Initial diagnosis Cytogenetics: t(11;14) translocation    Cycle 9 / Day 1   of Daratumumab  SQ + Cyclophosphamide PO + Bortezomib  SQ q28d x 8 cycles / Daratumumab  SQ q28d   INTERVAL HISTORY:  Cynthia Burns is a 78 y.o. female who is here today for continued evaluation and management of multiple myeloma. accompanied by husband and daughter.  she was last seen by me on 09/15/2024; at the time she mentioned experiencing a mild rash with the previous cycle of Revlimid , but otherwise was doing well without any concerns.  Today, she says that she has had no recurrence in rash since restarting Revlimid , but has had some diarrhea. She is now off the steroids and is still taking antihistamines.  Denies any new infection issues, fevers/chills, drenching night sweats, leg swelling/pain.  REVIEW OF SYSTEMS:    10 Point review of systems of done and is negative except as noted above.  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   HSV-1 infection    HYPERTENSION    Hypothyroid    MITRAL VALVE PROLAPSE    OSTEOPENIA    Overweight(278.02)    PHARYNGITIS, ACUTE     SURGICAL HISTORY Past Surgical History:  Procedure Laterality Date   ABDOMINAL HYSTERECTOMY     CHOLECYSTECTOMY     prolped bladder     WISDOM TOOTH EXTRACTION      SOCIAL HISTORY Social History   Tobacco Use   Smoking status: Never   Smokeless tobacco: Never  Vaping Use   Vaping status: Never Used  Substance Use Topics   Alcohol use: No    Alcohol/week: 0.0 standard drinks of alcohol   Drug use: No    Social History   Social History Narrative   Not on file    SOCIAL DRIVERS OF HEALTH SDOH Screenings   Food Insecurity: Patient Declined (02/29/2024)  Housing: Patient Declined (02/29/2024)  Transportation Needs: No Transportation Needs (02/29/2024)  Utilities: Not  At Risk (02/29/2024)  Alcohol Screen: Low Risk  (07/24/2023)  Depression (PHQ2-9): Low Risk  (09/15/2024)  Financial Resource Strain: Low Risk  (12/07/2023)  Physical Activity: Insufficiently Active (12/07/2023)  Social Connections: Socially Integrated (02/29/2024)  Stress: No Stress Concern Present (12/07/2023)  Tobacco Use: Low Risk  (03/21/2024)  Health Literacy: Adequate Health Literacy (07/31/2023)     FAMILY HISTORY Family History  Problem Relation Age of Onset   Hypertension Mother    Heart disease Mother        Had a stent   Emphysema Father    Diabetes Father    COPD Father    Stroke Sister    Breast cancer Neg Hx      ALLERGIES: is allergic to mucinex [guaifenesin er], bystolic  [nebivolol  hcl], calcium -containing compounds, codeine, paxlovid  [nirmatrelvir -ritonavir ], advicor [niacin-lovastatin er], amlodipine, lisinopril-hydrochlorothiazide, other, ramipril, and  sulfonamide derivatives.  MEDICATIONS  Current Outpatient Medications  Medication Sig Dispense Refill   acyclovir  (ZOVIRAX ) 400 MG tablet Take 1 tablet (400 mg total) by mouth 2 (two) times daily. 60 tablet 5   amoxicillin  (AMOXIL ) 500 MG capsule Take 2,000 mg by mouth See admin instructions. Take 2,000 mg by mouth one hour prior to dental visits (Patient not taking: Reported on 07/20/2024)     aspirin  EC 81 MG tablet Take 81 mg by mouth daily. Swallow whole.     Biotin 5000 MCG CAPS Take 5,000 mcg by mouth daily.     Blood Glucose Monitoring Suppl (ONE TOUCH ULTRA 2) w/Device KIT CHECK BLOOD SUGAR TWICE DAILY.  DX CODE  E11.9 1 kit 0   Cholecalciferol (VITAMIN D3) 1000 units CAPS Take 1,000 Units by mouth daily.     clonazePAM  (KLONOPIN ) 0.5 MG tablet Take 1 tablet (0.5 mg total) by mouth 2 (two) times daily as needed for anxiety. 30 tablet 0   Cyanocobalamin  (VITAMIN B-12) 3000 MCG SUBL Place 3,000 mcg under the tongue daily in the afternoon.     dexamethasone  (DECADRON ) 4 MG tablet TAKE FIVE TABLETS BY MOUTH EVERY DAY WITH BREAKFAST THE DAY AFTER EVERY DARATUMUMAB  DOSE 30 tablet 1   fexofenadine (ALLEGRA) 180 MG tablet Take 180 mg by mouth daily.     fexofenadine-pseudoephedrine (ALLEGRA-D 24) 180-240 MG 24 hr tablet Take 1 tablet by mouth daily.     furosemide  (LASIX ) 20 MG tablet TAKE 1 TABLET BY MOUTH EVERY DAY (Patient not taking: Reported on 07/20/2024) 90 tablet 1   glucose blood (ONETOUCH ULTRA TEST) test strip USE ONE STRIP TO TEST AS DIRECTED - (KEEP UNUSED STRIPS IN ORIGINAL SEALED CONTAINER BETWEEN USES) 200 each 3   [Paused] hydrALAZINE  (APRESOLINE ) 25 MG tablet Take 25 mg by mouth See admin instructions. Take 25 mg by mouth at 8 AM, 3 PM, and 11 PM- in conjunction with one 50 mg tablet to equal a total dose of 75 mg     insulin  aspart (NOVOLOG  FLEXPEN) 100 UNIT/ML FlexPen Per sliding scale max 40 u daily 15 mL 3   Insulin  Pen Needle (B-D UF III MINI PEN NEEDLES) 31G X 5 MM MISC To  use w/ Novolog  100 each 2   Lancets (ONETOUCH DELICA PLUS LANCET33G) MISC USE 1 LANCET TO TEST AS DIRECTED (DISCARD LANCET IN SHARPS CONTAINER IMMEDIATELY AFTER USE) 200 each 3   lenalidomide  (REVLIMID ) 15 MG capsule Take 1 capsule (15 mg total) by mouth daily. Take 1 capsule (15 mg total) by mouth daily for 21 days and then take 7 days off. 21 capsule 0   levothyroxine  (SYNTHROID )  25 MCG tablet TAKE ONE TABLET BY MOUTH EVERY DAY BEFORE BREAKFAST 90 tablet 0   Multiple Vitamins-Minerals (PRESERVISION AREDS 2) CAPS Take 1 capsule by mouth 2 (two) times daily with a meal.     ondansetron  (ZOFRAN ) 8 MG tablet Take 8 mg by mouth 30 to 60 min prior to Cyclophosphamide administration then take 8 mg every 8 hrs as needed for nausea and vomiting. 30 tablet 1   pantoprazole  (PROTONIX ) 40 MG tablet Pantoprazole  40 mg before breakfast and dinner for 90 days with 3 refills (Patient taking differently: Take 40 mg by mouth See admin instructions. Take 40 mg by mouth 30 minutes before lunch and supper) 180 tablet 3   predniSONE  (DELTASONE ) 5 MG tablet Take 1 tablet (5 mg total) by mouth daily with breakfast. 30 tablet 0   prochlorperazine  (COMPAZINE ) 10 MG tablet Take 1 tablet (10 mg total) by mouth every 6 (six) hours as needed for nausea or vomiting. 30 tablet 1   rosuvastatin  (CRESTOR ) 20 MG tablet TAKE ONE TABLET BY MOUTH EVERY DAY 90 tablet 3   RYBELSUS  7 MG TABS TAKE ONE TABLET BY MOUTH EVERY DAY - (TAKE WITH NO MORE THAN 4 OUNCES OF PLAIN WATER AT LEAST 30 MINUTES BEFORE THE FIRST FOOD, BEVERAGE, OR OTHER MEDICATIONS OF THE DAY) 90 tablet 0   SYSTANE ULTRA PF 0.4-0.3 % SOLN Place 1 drop into both eyes See admin instructions. Instill 1 drop into both eyes one to four times a day     No current facility-administered medications for this visit.    PHYSICAL EXAMINATION: ECOG PERFORMANCE STATUS: 1 - Symptomatic but completely ambulatory VITALS: Vitals:   10/12/24 0928  BP: (!) 139/57  Pulse: 69  Resp: 18   Temp: (!) 97.3 F (36.3 C)  SpO2: 99%   Filed Weights   10/12/24 0928  Weight: 128 lb 1.6 oz (58.1 kg)   Body mass index is 24.2 kg/m.  GENERAL: alert, in no acute distress and comfortable SKIN: no acute rashes, no significant lesions EYES: conjunctiva are pink and non-injected, sclera anicteric OROPHARYNX: MMM, no exudates, no oropharyngeal erythema or ulceration NECK: supple, no JVD LYMPH:  no palpable lymphadenopathy in the cervical, axillary or inguinal regions LUNGS: clear to auscultation b/l with normal respiratory effort HEART: regular rate & rhythm ABDOMEN:  normoactive bowel sounds , non tender, not distended, no hepatosplenomegaly Extremity: no pedal edema PSYCH: alert & oriented x 3 with fluent speech NEURO: no focal motor/sensory deficits  LABORATORY DATA:   I have reviewed the data as listed     Latest Ref Rng & Units 10/12/2024    8:37 AM 09/28/2024   11:51 AM 09/15/2024    9:53 AM  CBC EXTENDED  WBC 4.0 - 10.5 K/uL 7.8  13.8  6.0   RBC 3.87 - 5.11 MIL/uL 3.45  3.86  3.73   Hemoglobin 12.0 - 15.0 g/dL 89.0  87.8  88.3   HCT 36.0 - 46.0 % 31.9  35.8  34.3   Platelets 150 - 400 K/uL 178  225  193   NEUT# 1.7 - 7.7 K/uL 5.8  11.1  3.7   Lymph# 0.7 - 4.0 K/uL 0.9  1.4  1.3       Latest Ref Rng & Units 10/12/2024    8:37 AM 09/15/2024    9:53 AM 08/31/2024    7:46 AM  CMP  Glucose 70 - 99 mg/dL 825  850  824   BUN 8 - 23 mg/dL 46  47  40   Creatinine 0.44 - 1.00 mg/dL 8.61  8.61  8.71   Sodium 135 - 145 mmol/L 139  141  136   Potassium 3.5 - 5.1 mmol/L 3.8  3.9  4.0   Chloride 98 - 111 mmol/L 107  108  102   CO2 22 - 32 mmol/L 26  28  27    Calcium  8.9 - 10.3 mg/dL 8.8  9.0  9.0   Total Protein 6.5 - 8.1 g/dL 5.8  6.1  6.2   Total Bilirubin 0.0 - 1.2 mg/dL 0.4  0.4  0.4   Alkaline Phos 38 - 126 U/L 59  57  56   AST 15 - 41 U/L 17  23  16    ALT 0 - 44 U/L 17  29  22     MULTIPLE MYELOMA & KAPPA/LAMBDA LIGHT CHAINS 04/2024 - 09/2024    RADIOGRAPHIC  STUDIES: I have personally reviewed the radiological images as listed and agreed with the findings in the report. No results found.   ASSESSMENT & PLAN:   78 y.o. female with  Multiple myeloma - Lambda restricted plasma cell neoplasm comprising greater than 75% of  the cellular marrow  Molecular Cyto t(11;14) PET/CT with no bone lesions 2. Acute on CKD - resolved to baseline 3. HTN 4. DM2 5. Hypothyroidism PLAN: - Discussed lab results on 10/12/2024 in detail with patient: CBC showed WBC of 7.8K, Hemoglobin of 10.9 decreased from 12.1, RBC 3.45 decreased from 3.86, and PLTs of 178K. Mild anemia likely contributed by Revlimid  CMP with Creatinine 1.38 has not changed and Calcium  8.8 decreased from 9. M protein 0.2 which is consistent with VGPR Kappa/Lambda Lights Chains WNL with normal ratio If Revlimid  continues to affect M protein to undetectable we can eventually lower Revlimid  to maintenance   - Continue Zyrtec and Pepcid   - Answered questions regarding scheduling, Revlimid  frequency, and cross-toxicity.   - Discussed that she does not have any high risk mutations and will likely only need single-agent maintenance.   - Continue Dara q2 weeks - Continue current dose of Revlimid   - Vaccine counseling provided: she is UTD on her recommended vaccinations, except for Covid-19 as she is scheduled for a mammogram   FOLLOW-UP Plz schedule next 2 cycles of treatment per integrated scheduling  The total time spent in the appointment was 30 minutes* .  All of the patient's questions were answered and the patient knows to call the clinic with any problems, questions, or concerns.  Emaline Saran MD MS AAHIVMS [facility] Hematology/Oncology Physician [facility]  *Total Encounter Time as defined by the Centers for Medicare and Medicaid Services includes, in addition to the face-to-face time of a patient visit (documented in the note above) non-face-to-face time: obtaining  and reviewing outside history, ordering and reviewing medications, tests or procedures, care coordination (communications with other health care professionals or caregivers) and documentation in the medical record.  I,Emily Lagle,acting as a neurosurgeon for Emaline Saran, MD.,have documented all relevant documentation on the behalf of Emaline Saran, MD,as directed by  Emaline Saran, MD while in the presence of Emaline Saran, MD.  I have reviewed the above documentation for accuracy and completeness, and I agree with the above.  Gurshan Settlemire, MD

## 2024-10-13 LAB — KAPPA/LAMBDA LIGHT CHAINS
Kappa free light chain: 6.2 mg/L (ref 3.3–19.4)
Kappa, lambda light chain ratio: 1.09 (ref 0.26–1.65)
Lambda free light chains: 5.7 mg/L (ref 5.7–26.3)

## 2024-10-17 ENCOUNTER — Other Ambulatory Visit: Payer: Self-pay

## 2024-10-17 DIAGNOSIS — C9000 Multiple myeloma not having achieved remission: Secondary | ICD-10-CM

## 2024-10-17 LAB — MULTIPLE MYELOMA PANEL, SERUM
Albumin SerPl Elph-Mcnc: 3 g/dL (ref 2.9–4.4)
Albumin/Glob SerPl: 1.4 (ref 0.7–1.7)
Alpha 1: 0.3 g/dL (ref 0.0–0.4)
Alpha2 Glob SerPl Elph-Mcnc: 0.8 g/dL (ref 0.4–1.0)
B-Globulin SerPl Elph-Mcnc: 0.8 g/dL (ref 0.7–1.3)
Gamma Glob SerPl Elph-Mcnc: 0.4 g/dL (ref 0.4–1.8)
Globulin, Total: 2.3 g/dL (ref 2.2–3.9)
IgA: 8 mg/dL — ABNORMAL LOW (ref 64–422)
IgG (Immunoglobin G), Serum: 462 mg/dL — ABNORMAL LOW (ref 586–1602)
IgM (Immunoglobulin M), Srm: 17 mg/dL — ABNORMAL LOW (ref 26–217)
M Protein SerPl Elph-Mcnc: 0.2 g/dL — ABNORMAL HIGH
Total Protein ELP: 5.3 g/dL — ABNORMAL LOW (ref 6.0–8.5)

## 2024-10-17 MED ORDER — LENALIDOMIDE 15 MG PO CAPS: 15.0000 mg | ORAL_CAPSULE | Freq: Every day | ORAL | 0 refills | Status: DC

## 2024-10-18 ENCOUNTER — Other Ambulatory Visit: Payer: Self-pay

## 2024-10-18 ENCOUNTER — Encounter: Payer: Self-pay | Admitting: Hematology

## 2024-10-26 ENCOUNTER — Inpatient Hospital Stay

## 2024-10-26 ENCOUNTER — Inpatient Hospital Stay: Attending: Hematology

## 2024-10-26 VITALS — BP 159/69 | HR 71 | Temp 98.0°F | Resp 18 | Ht 61.0 in | Wt 130.5 lb

## 2024-10-26 DIAGNOSIS — C9000 Multiple myeloma not having achieved remission: Secondary | ICD-10-CM | POA: Diagnosis not present

## 2024-10-26 DIAGNOSIS — Z5112 Encounter for antineoplastic immunotherapy: Secondary | ICD-10-CM | POA: Diagnosis not present

## 2024-10-26 DIAGNOSIS — Z79620 Long term (current) use of immunosuppressive biologic: Secondary | ICD-10-CM | POA: Insufficient documentation

## 2024-10-26 LAB — CBC WITH DIFFERENTIAL (CANCER CENTER ONLY)
Abs Immature Granulocytes: 0.17 K/uL — ABNORMAL HIGH (ref 0.00–0.07)
Basophils Absolute: 0.1 K/uL (ref 0.0–0.1)
Basophils Relative: 1 %
Eosinophils Absolute: 0.3 K/uL (ref 0.0–0.5)
Eosinophils Relative: 3 %
HCT: 32.3 % — ABNORMAL LOW (ref 36.0–46.0)
Hemoglobin: 11 g/dL — ABNORMAL LOW (ref 12.0–15.0)
Immature Granulocytes: 2 %
Lymphocytes Relative: 10 %
Lymphs Abs: 1 K/uL (ref 0.7–4.0)
MCH: 31.8 pg (ref 26.0–34.0)
MCHC: 34.1 g/dL (ref 30.0–36.0)
MCV: 93.4 fL (ref 80.0–100.0)
Monocytes Absolute: 0.5 K/uL (ref 0.1–1.0)
Monocytes Relative: 5 %
Neutro Abs: 8.2 K/uL — ABNORMAL HIGH (ref 1.7–7.7)
Neutrophils Relative %: 79 %
Platelet Count: 186 K/uL (ref 150–400)
RBC: 3.46 MIL/uL — ABNORMAL LOW (ref 3.87–5.11)
RDW: 12.6 % (ref 11.5–15.5)
WBC Count: 10.3 K/uL (ref 4.0–10.5)
nRBC: 0 % (ref 0.0–0.2)

## 2024-10-26 MED ORDER — DARATUMUMAB-HYALURONIDASE-FIHJ 1800-30000 MG-UT/15ML ~~LOC~~ SOLN
1800.0000 mg | Freq: Once | SUBCUTANEOUS | Status: AC
  Administered 2024-10-26: 1800 mg via SUBCUTANEOUS
  Filled 2024-10-26: qty 15

## 2024-10-26 MED ORDER — DEXAMETHASONE 6 MG PO TABS
12.0000 mg | ORAL_TABLET | Freq: Once | ORAL | Status: AC
  Administered 2024-10-26: 12 mg via ORAL
  Filled 2024-10-26: qty 2

## 2024-10-26 MED ORDER — DIPHENHYDRAMINE HCL 25 MG PO CAPS
25.0000 mg | ORAL_CAPSULE | Freq: Once | ORAL | Status: AC
  Administered 2024-10-26: 25 mg via ORAL
  Filled 2024-10-26: qty 1

## 2024-10-26 MED ORDER — ACETAMINOPHEN 325 MG PO TABS
650.0000 mg | ORAL_TABLET | Freq: Once | ORAL | Status: AC
  Administered 2024-10-26: 650 mg via ORAL
  Filled 2024-10-26: qty 2

## 2024-10-26 NOTE — Patient Instructions (Signed)

## 2024-10-28 ENCOUNTER — Ambulatory Visit
Admission: RE | Admit: 2024-10-28 | Discharge: 2024-10-28 | Disposition: A | Discharge status: Home / Self Care | Source: Ambulatory Visit | Attending: Family Medicine | Admitting: Family Medicine

## 2024-10-28 ENCOUNTER — Other Ambulatory Visit: Payer: Self-pay

## 2024-10-28 DIAGNOSIS — Z1231 Encounter for screening mammogram for malignant neoplasm of breast: Secondary | ICD-10-CM | POA: Diagnosis not present

## 2024-11-06 ENCOUNTER — Other Ambulatory Visit: Payer: Self-pay

## 2024-11-09 ENCOUNTER — Inpatient Hospital Stay

## 2024-11-09 ENCOUNTER — Inpatient Hospital Stay (HOSPITAL_BASED_OUTPATIENT_CLINIC_OR_DEPARTMENT_OTHER): Admitting: Hematology

## 2024-11-09 VITALS — BP 138/55 | HR 71 | Temp 97.3°F | Resp 18 | Wt 127.9 lb

## 2024-11-09 DIAGNOSIS — Z5111 Encounter for antineoplastic chemotherapy: Secondary | ICD-10-CM

## 2024-11-09 DIAGNOSIS — C9000 Multiple myeloma not having achieved remission: Secondary | ICD-10-CM

## 2024-11-09 DIAGNOSIS — Z79620 Long term (current) use of immunosuppressive biologic: Secondary | ICD-10-CM | POA: Diagnosis not present

## 2024-11-09 DIAGNOSIS — Z5112 Encounter for antineoplastic immunotherapy: Secondary | ICD-10-CM | POA: Diagnosis not present

## 2024-11-09 LAB — COMPREHENSIVE METABOLIC PANEL WITH GFR
ALT: 18 U/L (ref 0–44)
AST: 20 U/L (ref 15–41)
Albumin: 3.9 g/dL (ref 3.5–5.0)
Alkaline Phosphatase: 65 U/L (ref 38–126)
Anion gap: 11 (ref 5–15)
BUN: 44 mg/dL — ABNORMAL HIGH (ref 8–23)
CO2: 24 mmol/L (ref 22–32)
Calcium: 8.9 mg/dL (ref 8.9–10.3)
Chloride: 103 mmol/L (ref 98–111)
Creatinine, Ser: 1.32 mg/dL — ABNORMAL HIGH (ref 0.44–1.00)
GFR, Estimated: 42 mL/min — ABNORMAL LOW (ref >60)
Glucose, Bld: 154 mg/dL — ABNORMAL HIGH (ref 70–99)
Potassium: 3.6 mmol/L (ref 3.5–5.1)
Sodium: 138 mmol/L (ref 135–145)
Total Bilirubin: 0.3 mg/dL (ref 0.0–1.2)
Total Protein: 5.9 g/dL — ABNORMAL LOW (ref 6.5–8.1)

## 2024-11-09 LAB — CBC WITH DIFFERENTIAL (CANCER CENTER ONLY)
Abs Immature Granulocytes: 0.03 K/uL (ref 0.00–0.07)
Basophils Absolute: 0.1 K/uL (ref 0.0–0.1)
Basophils Relative: 1 %
Eosinophils Absolute: 0.4 K/uL (ref 0.0–0.5)
Eosinophils Relative: 7 %
HCT: 33.1 % — ABNORMAL LOW (ref 36.0–46.0)
Hemoglobin: 11.2 g/dL — ABNORMAL LOW (ref 12.0–15.0)
Immature Granulocytes: 1 %
Lymphocytes Relative: 13 %
Lymphs Abs: 0.8 K/uL (ref 0.7–4.0)
MCH: 31.5 pg (ref 26.0–34.0)
MCHC: 33.8 g/dL (ref 30.0–36.0)
MCV: 93.2 fL (ref 80.0–100.0)
Monocytes Absolute: 0.6 K/uL (ref 0.1–1.0)
Monocytes Relative: 9 %
Neutro Abs: 4.4 K/uL (ref 1.7–7.7)
Neutrophils Relative %: 69 %
Platelet Count: 179 K/uL (ref 150–400)
RBC: 3.55 MIL/uL — ABNORMAL LOW (ref 3.87–5.11)
RDW: 13 % (ref 11.5–15.5)
WBC Count: 6.3 K/uL (ref 4.0–10.5)
nRBC: 0 % (ref 0.0–0.2)

## 2024-11-09 MED ORDER — ACETAMINOPHEN 325 MG PO TABS
650.0000 mg | ORAL_TABLET | Freq: Once | ORAL | Status: AC
  Administered 2024-11-09: 650 mg via ORAL
  Filled 2024-11-09: qty 2

## 2024-11-09 MED ORDER — DIPHENHYDRAMINE HCL 25 MG PO CAPS
25.0000 mg | ORAL_CAPSULE | Freq: Once | ORAL | Status: AC
  Administered 2024-11-09: 25 mg via ORAL
  Filled 2024-11-09: qty 1

## 2024-11-09 MED ORDER — DARATUMUMAB-HYALURONIDASE-FIHJ 1800-30000 MG-UT/15ML ~~LOC~~ SOLN
1800.0000 mg | Freq: Once | SUBCUTANEOUS | Status: AC
  Administered 2024-11-09: 1800 mg via SUBCUTANEOUS
  Filled 2024-11-09: qty 15

## 2024-11-09 MED ORDER — DEXAMETHASONE 6 MG PO TABS
12.0000 mg | ORAL_TABLET | Freq: Once | ORAL | Status: AC
  Administered 2024-11-09: 12 mg via ORAL
  Filled 2024-11-09: qty 2

## 2024-11-09 NOTE — Progress Notes (Signed)
 HEMATOLOGY ONCOLOGY PROGRESS NOTE  Date of service: 11/09/2024  Patient Care Team: Antonio Meth, Jamee SAUNDERS, DO as PCP - General Delford Maude BROCKS, MD as PCP - Cardiology (Cardiology) Rosan Credit, MD as Consulting Physician (Ophthalmology) Obie Princella HERO, MD (Inactive) (Gastroenterology) Vernetta Lonni GRADE, MD as Consulting Physician (Orthopedic Surgery) Ivin Kocher, MD as Referring Physician (Dermatology) Onesimo Emaline Brink, MD as Consulting Physician (Hematology)  CHIEF COMPLAINT/PURPOSE OF CONSULTATION: Follow-up for continued evaluation and management of Multiple Myeloma  HISTORY OF PRESENTING ILLNESS: (2/13/20215) Cynthia Burns is a wonderful 78 y.o. female who was scheduled to see Dr. Andriette as a new patient and was referred to the ED for evaluation and management of newly diagnosed myeloma with rapidly worsening hgb and renal insuffiencey with proteinuria. High suspicious of Multiple myeloma.    Outside labs done at Dr Ephriam Singh's office (available in referral under media) show M spike of 6.1g/dl and Kappa light chains in the 400's.   Patient is accompanied by her husband and her daughter at bed side during the visit. Patient notes she has been having significantly more fatigue. Has required PRBC transfusions for symptomatic anemia.   She complains of fatigue, right ankle pain, and unexpected weight loss of around 25-30 lbs due to appetite loss in the past year. Her daughter notes that the patient has been confused more often as well.    She denies any new infection issues, fever, chills, back pain, chest pain, abdominal pain, or leg swelling.    Patient notes she tested positive for COVID-19 infection in August 2022 and was prescribed Paxlovid . However, she did not tolerate the medication due to allergic reaction.   PmHx of hypertension. She has been taking Losartan  and hydralazine .   Surgery history: Hysterectomy and Cholecystectomy.    She was  started on dexamethasone  20 mg daily x 4 doses yesterday. She has been tolerating the high-dose steroid well.    Cynthia Burns is a 78 y.o. female here for evaluation and management of newly diagnosed myeloma with rapidly worsening hgb and renal insuffiencey with proteinuria.    Patient was seen by me in the ED on 01/30/2024 and complained of significantly worsened fatigue as well as right ankle pain, increased confusion, unexpected weight loss of about 25-30 pounds in 1 year due to appetite loss.   Today, she is accompanied by her husband and daughter. Her husband reports that patient has been feeling extremely weak since being discharged from the hospital. Patient reports no other new symptoms since her inpatient visit.    Patient's husband reports that patient was started on 0.5 MG Clonazepam  by Dr. Austria in the hospital for anxiety management. Her husband reports that Clonazepam  caused sedation, so it has been cut in half.    Patient's husband reports that her blood pressure generally fluctuates at home. Her blood pressure in clinic today is 161/74. She notes that she takes her blood pressure medication at lunch time. She reports one incident in which her blood pressure was over 200, though it has not been persistent at that level.   Patient reports that her baseline weight one year ago was 136 pounds. She did lose about 32 pounds recently, but she has gained back 12 pounds. Her current weight is 116 pounds.    She has not received the latest COVID-19 vaccine. Patient is otherwise UTD with her age-appropriate vaccines, including flu, RSV, shingles, and pneumonia.    She notes that she was infected with COVID-19 3x previously.  Patient does endorse some lightheadedness. She denies any back pain or abdominal pain.    Pt reports a hx of prolapsed bladder, which is not sympomatically bothersome.    Patient reports allergies to several medications.    She does stay regularly hydrated by  consuming nearly 3L of water daily.    Patient will have a kidney biopsy on 02/18/2024.  She will see her kidney doctor next on 02/24/2024.    Patient reports that her PCP has started her on Rybelsus  for DM management. She reports that her next visit with her PCP is 02/25/2024.    SUMMARY OF ONCOLOGIC HISTORY: Oncology History  Multiple myeloma (HCC)  01/29/2024 Initial Diagnosis   Multiple myeloma (HCC)   02/23/2024 -  Chemotherapy   Patient is on Treatment Plan : MYELOMA DaraCyBorD (Daratumumab  SQ + Cyclophosphamide PO + Bortezomib  SQ + Dexamethasone  PO) q28d x 8 cycles / Daratumumab  SQ q28d     03/14/2024 Cancer Staging   Staging form: Plasma Cell Myeloma and Plasma Cell Disorders, AJCC 8th Edition - Clinical: High-risk cytogenetics: Absent - Signed by Onesimo Emaline Brink, MD on 03/14/2024 Stage prefix: Initial diagnosis Cytogenetics: t(11;14) translocation    Cycle 10 / Day 1   of Daratumumab  SQ + Cyclophosphamide PO + Bortezomib  SQ q28d x 8 cycles / Daratumumab  SQ q28d    INTERVAL HISTORY:  Cynthia Burns is a 78 y.o. female who is here today for continued evaluation and management of Multiple Myeloma. accompanied by husband and daughter on phone.   she was last seen by me on 10/12/2024; at the time she mentioned experiencing some diarrhea, but was otherwise doing well.  Today, she says that she has been doing well. She reports that she will be seeing her Nephrologist, Dr. Dennise in December.  She notes that she is up to date on all of her vaccines, except for Covid-19 which she plans on updating, but was waiting until after her mammogram.  She says that she has been tolerating her Revlimid  without any new rashes.  Denies any new fevers/chills, enlarged lymph nodes, abdominal pain, leg swelling, or new back pain. She does wear compression socks for edema.  REVIEW OF SYSTEMS:   10 Point review of systems of done and is negative except as noted above.  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   HSV-1 infection    HYPERTENSION    Hypothyroid    MITRAL VALVE PROLAPSE    OSTEOPENIA    Overweight(278.02)    PHARYNGITIS, ACUTE     SURGICAL HISTORY Past Surgical History:  Procedure Laterality Date   ABDOMINAL HYSTERECTOMY     CHOLECYSTECTOMY     prolped bladder     WISDOM TOOTH EXTRACTION      SOCIAL HISTORY Social History   Tobacco Use   Smoking status: Never   Smokeless tobacco: Never  Vaping Use   Vaping status: Never Used  Substance Use Topics   Alcohol use: No    Alcohol/week: 0.0 standard drinks of alcohol   Drug use: No    Social History   Social History Narrative   Not on file  SOCIAL DRIVERS OF HEALTH SDOH Screenings   Food Insecurity: Patient Declined (02/29/2024)  Housing: Patient Declined (02/29/2024)  Transportation Needs: No Transportation Needs (02/29/2024)  Utilities: Not At Risk (02/29/2024)  Alcohol Screen: Low Risk  (07/24/2023)  Depression (PHQ2-9): Low Risk  (10/12/2024)  Financial Resource Strain: Low Risk  (12/07/2023)  Physical Activity: Insufficiently Active (12/07/2023)  Social Connections: Socially Integrated (02/29/2024)  Stress: No Stress Concern Present (12/07/2023)  Tobacco Use: Low Risk  (03/21/2024)  Health Literacy: Adequate Health Literacy (07/31/2023)     FAMILY HISTORY Family History  Problem Relation Age of Onset   Hypertension Mother    Heart disease Mother        Had a stent   Emphysema Father    Diabetes Father    COPD Father    Stroke Sister    Breast cancer Neg Hx      ALLERGIES: is allergic to mucinex [guaifenesin er], bystolic  [nebivolol  hcl], calcium -containing compounds, codeine, paxlovid   [nirmatrelvir -ritonavir ], advicor [niacin-lovastatin er], amlodipine, lisinopril-hydrochlorothiazide, other, ramipril, and sulfonamide derivatives.  MEDICATIONS  Current Outpatient Medications  Medication Sig Dispense Refill   acyclovir  (ZOVIRAX ) 400 MG tablet TAKE 1 TABLET BY MOUTH TWICE A DAY 180 tablet 1   amoxicillin  (AMOXIL ) 500 MG capsule Take 2,000 mg by mouth See admin instructions. Take 2,000 mg by mouth one hour prior to dental visits (Patient not taking: Reported on 07/20/2024)     aspirin  EC 81 MG tablet Take 81 mg by mouth daily. Swallow whole.     Biotin 5000 MCG CAPS Take 5,000 mcg by mouth daily.     Blood Glucose Monitoring Suppl (ONE TOUCH ULTRA 2) w/Device KIT CHECK BLOOD SUGAR TWICE DAILY.  DX CODE  E11.9 1 kit 0   Cholecalciferol (VITAMIN D3) 1000 units CAPS Take 1,000 Units by mouth daily.     clonazePAM  (KLONOPIN ) 0.5 MG tablet Take 1 tablet (0.5 mg total) by mouth 2 (two) times daily as needed for anxiety. 30 tablet 0   Cyanocobalamin  (VITAMIN B-12) 3000 MCG SUBL Place 3,000 mcg under the tongue daily in the afternoon.     dexamethasone  (DECADRON ) 4 MG tablet TAKE FIVE TABLETS BY MOUTH EVERY DAY WITH BREAKFAST THE DAY AFTER EVERY DARATUMUMAB  DOSE 30 tablet 1   fexofenadine (ALLEGRA) 180 MG tablet Take 180 mg by mouth daily.     fexofenadine-pseudoephedrine (ALLEGRA-D 24) 180-240 MG 24 hr tablet Take 1 tablet by mouth daily.     furosemide  (LASIX ) 20 MG tablet TAKE 1 TABLET BY MOUTH EVERY DAY (Patient not taking: Reported on 07/20/2024) 90 tablet 1   glucose blood (ONETOUCH ULTRA TEST) test strip USE ONE STRIP TO TEST AS DIRECTED - (KEEP UNUSED STRIPS IN ORIGINAL SEALED CONTAINER BETWEEN USES) 200 each 3   [Paused] hydrALAZINE  (APRESOLINE ) 25 MG tablet Take 25 mg by mouth See admin instructions. Take 25 mg by mouth at 8 AM, 3 PM, and 11 PM- in conjunction with one 50 mg tablet to equal a total dose of 75 mg     insulin  aspart (NOVOLOG  FLEXPEN) 100 UNIT/ML FlexPen Per sliding  scale max 40 u daily 15 mL 3   Insulin  Pen Needle (B-D UF III MINI PEN NEEDLES) 31G X 5 MM MISC To use w/ Novolog  100 each 2   Lancets (ONETOUCH DELICA PLUS LANCET33G) MISC USE 1 LANCET TO TEST AS DIRECTED (DISCARD LANCET IN SHARPS CONTAINER IMMEDIATELY AFTER USE) 200 each 3   lenalidomide  (REVLIMID ) 15 MG capsule Take 1 capsule (15 mg total) by mouth daily. Take  1 capsule (15 mg total) by mouth daily for 21 days and then take 7 days off. 21 capsule 0   levothyroxine  (SYNTHROID ) 25 MCG tablet TAKE ONE TABLET BY MOUTH EVERY DAY BEFORE BREAKFAST 90 tablet 0   Multiple Vitamins-Minerals (PRESERVISION AREDS 2) CAPS Take 1 capsule by mouth 2 (two) times daily with a meal.     ondansetron  (ZOFRAN ) 8 MG tablet Take 8 mg by mouth 30 to 60 min prior to Cyclophosphamide administration then take 8 mg every 8 hrs as needed for nausea and vomiting. 30 tablet 1   pantoprazole  (PROTONIX ) 40 MG tablet Pantoprazole  40 mg before breakfast and dinner for 90 days with 3 refills (Patient taking differently: Take 40 mg by mouth See admin instructions. Take 40 mg by mouth 30 minutes before lunch and supper) 180 tablet 3   predniSONE  (DELTASONE ) 5 MG tablet Take 1 tablet (5 mg total) by mouth daily with breakfast. 30 tablet 0   prochlorperazine  (COMPAZINE ) 10 MG tablet Take 1 tablet (10 mg total) by mouth every 6 (six) hours as needed for nausea or vomiting. 30 tablet 1   rosuvastatin  (CRESTOR ) 20 MG tablet TAKE ONE TABLET BY MOUTH EVERY DAY 90 tablet 3   RYBELSUS  7 MG TABS TAKE ONE TABLET BY MOUTH EVERY DAY - (TAKE WITH NO MORE THAN 4 OUNCES OF PLAIN WATER AT LEAST 30 MINUTES BEFORE THE FIRST FOOD, BEVERAGE, OR OTHER MEDICATIONS OF THE DAY) 90 tablet 0   SYSTANE ULTRA PF 0.4-0.3 % SOLN Place 1 drop into both eyes See admin instructions. Instill 1 drop into both eyes one to four times a day     No current facility-administered medications for this visit.    PHYSICAL EXAMINATION: ECOG PERFORMANCE STATUS: 1 - Symptomatic  but completely ambulatory VITALS: Vitals:   11/09/24 1048  BP: (!) 138/55  Pulse: 71  Resp: 18  Temp: (!) 97.3 F (36.3 C)  SpO2: 99%   Filed Weights   11/09/24 1048  Weight: 127 lb 14.4 oz (58 kg)   Body mass index is 24.17 kg/m.  GENERAL: alert, in no acute distress and comfortable SKIN: no acute rashes, no significant lesions EYES: conjunctiva are pink and non-injected, sclera anicteric OROPHARYNX: MMM, no exudates, no oropharyngeal erythema or ulceration NECK: supple, no JVD LYMPH:  no palpable lymphadenopathy in the cervical, axillary or inguinal regions LUNGS: clear to auscultation b/l with normal respiratory effort HEART: regular rate & rhythm ABDOMEN:  normoactive bowel sounds , non tender, not distended, no hepatosplenomegaly Extremity: no pedal edema PSYCH: alert & oriented x 3 with fluent speech NEURO: no focal motor/sensory deficits  LABORATORY DATA:   I have reviewed the data as listed     Latest Ref Rng & Units 11/09/2024   10:07 AM 10/26/2024    7:53 AM 10/12/2024    8:37 AM  CBC EXTENDED  WBC 4.0 - 10.5 K/uL 6.3  10.3  7.8   RBC 3.87 - 5.11 MIL/uL 3.55  3.46  3.45   Hemoglobin 12.0 - 15.0 g/dL 88.7  88.9  89.0   HCT 36.0 - 46.0 % 33.1  32.3  31.9   Platelets 150 - 400 K/uL 179  186  178   NEUT# 1.7 - 7.7 K/uL 4.4  8.2  5.8   Lymph# 0.7 - 4.0 K/uL 0.8  1.0  0.9       Latest Ref Rng & Units 11/09/2024   10:07 AM 10/12/2024    8:37 AM 09/15/2024    9:53 AM  CMP  Glucose 70 - 99 mg/dL 845  825  850   BUN 8 - 23 mg/dL 44  46  47   Creatinine 0.44 - 1.00 mg/dL 8.67  8.61  8.61   Sodium 135 - 145 mmol/L 138  139  141   Potassium 3.5 - 5.1 mmol/L 3.6  3.8  3.9   Chloride 98 - 111 mmol/L 103  107  108   CO2 22 - 32 mmol/L 24  26  28    Calcium  8.9 - 10.3 mg/dL 8.9  8.8  9.0   Total Protein 6.5 - 8.1 g/dL 5.9  5.8  6.1   Total Bilirubin 0.0 - 1.2 mg/dL 0.3  0.4  0.4   Alkaline Phos 38 - 126 U/L 65  59  57   AST 15 - 41 U/L 20  17  23    ALT 0 - 44  U/L 18  17  29     MULTIPLE MYELOMA & KAPPA/LAMBDA LIGHT CHAINS 04/2024 - 09/2024    RADIOGRAPHIC STUDIES: I have personally reviewed the radiological images as listed and agreed with the findings in the report. MM 3D SCREENING MAMMOGRAM BILATERAL BREAST Result Date: 11/01/2024 CLINICAL DATA:  Screening. EXAM: DIGITAL SCREENING BILATERAL MAMMOGRAM WITH TOMOSYNTHESIS AND CAD TECHNIQUE: Bilateral screening digital craniocaudal and mediolateral oblique mammograms were obtained. Bilateral screening digital breast tomosynthesis was performed. The images were evaluated with computer-aided detection. COMPARISON:  Previous exam(s). ACR Breast Density Category b: There are scattered areas of fibroglandular density. FINDINGS: There are no findings suspicious for malignancy. IMPRESSION: No mammographic evidence of malignancy. A result letter of this screening mammogram will be mailed directly to the patient. RECOMMENDATION: Screening mammogram in one year. (Code:SM-B-01Y) BI-RADS CATEGORY  1: Negative. Electronically Signed   By: Craig Farr M.D.   On: 11/01/2024 08:06     ASSESSMENT & PLAN:  78 y.o. female with  Multiple myeloma - Lambda restricted plasma cell neoplasm comprising greater than 75% of  the cellular marrow  Molecular Cyto t(11;14) PET/CT with no bone lesions  2. Acute on CKD - resolved to baseline  3. HTN  4. DM2  5. Hypothyroidism  PLAN: - Discussed lab results on 11/09/2024 in detail with patient: CBC showed WBC of 6.3K, Hemoglobin of 11.2 increased from 11.0, and PLTs of 179K. CMP with Creatinine 1.32 decreased from 1.38. CKD stable.  - 10/13/2024 M protein continues to decrease, now at 0.2 from initial 6.5. Kappa/Lambda Lights Chains continue to normalize She meets criteria to move onto maintenance treatment, which we discussed, though I will continue to monitor. Discussed repeating bone marrow biopsy to evaluate response - scheduled for January 2026.  FOLLOW-UP  CT  guided bone marrow aspiration and biopsy in 1st week of Jan 2026 Continue Daratumumab  every 2 weeks per integrated scheduling MD visit in 4 weeks  The total time spent in the appointment was 32 minutes* .  All of the patient's questions were answered and the patient knows to call the clinic with any problems, questions, or concerns.  Emaline Saran MD MS AAHIVMS [facility] Hematology/Oncology Physician [facility]  *Total Encounter Time as defined by the Centers for Medicare and Medicaid Services includes, in addition to the face-to-face time of a patient visit (documented in the note above) non-face-to-face time: obtaining and reviewing outside history, ordering and reviewing medications, tests or procedures, care coordination (communications with other health care professionals or caregivers) and documentation in the medical record.  I,Emily Lagle,acting as a neurosurgeon for [employer]  Onesimo, MD.,have documented all relevant documentation on the behalf of Emaline Onesimo, MD,as directed by  Emaline Onesimo, MD while in the presence of Emaline Onesimo, MD.  I have reviewed the above documentation for accuracy and completeness, and I agree with the above.  Alyene Predmore, MD

## 2024-11-09 NOTE — Patient Instructions (Signed)
 [facility] - A DEPT OF [facility]  Discharge Instructions: Thank you for choosing [facility] to provide your oncology and hematology care.   If you have a lab appointment with the Cancer Center, please go directly to the Cancer Center and check in at the registration area.   Wear comfortable clothing and clothing appropriate for easy access to any Portacath or PICC line.   We strive to give you quality time with your provider. You may need to reschedule your appointment if you arrive late (15 or more minutes).  Arriving late affects you and other patients whose appointments are after yours.  Also, if you miss three or more appointments without notifying the office, you may be dismissed from the clinic at the provider's discretion.      For prescription refill requests, have your pharmacy contact our office and allow 72 hours for refills to be completed.    Today you received the following chemotherapy and/or immunotherapy agents: Daratumumab -hyaluronidase -fihj (Darzalex  faspro)    To help prevent nausea and vomiting after your treatment, we encourage you to take your nausea medication as directed.  BELOW ARE SYMPTOMS THAT SHOULD BE REPORTED IMMEDIATELY: *FEVER GREATER THAN 100.4 F (38 C) OR HIGHER *CHILLS OR SWEATING *NAUSEA AND VOMITING THAT IS NOT CONTROLLED WITH YOUR NAUSEA MEDICATION *UNUSUAL SHORTNESS OF BREATH *UNUSUAL BRUISING OR BLEEDING *URINARY PROBLEMS (pain or burning when urinating, or frequent urination) *BOWEL PROBLEMS (unusual diarrhea, constipation, pain near the anus) TENDERNESS IN MOUTH AND THROAT WITH OR WITHOUT PRESENCE OF ULCERS (sore throat, sores in mouth, or a toothache) UNUSUAL RASH, SWELLING OR PAIN  UNUSUAL VAGINAL DISCHARGE OR ITCHING   Items with * indicate a potential emergency and should be followed up as soon as possible or go to the Emergency Department if any problems should occur.  Please show the  CHEMOTHERAPY ALERT CARD or IMMUNOTHERAPY ALERT CARD at check-in to the Emergency Department and triage nurse.  Should you have questions after your visit or need to cancel or reschedule your appointment, please contact [facility] - A DEPT OF [facility]  Dept: [phone]  and follow the prompts.  Office hours are 8:00 a.m. to 4:30 p.m. Monday - Friday. Please note that voicemails left after 4:00 p.m. may not be returned until the following business day.  We are closed weekends and major holidays. You have access to a nurse at all times for urgent questions. Please call the main number to the clinic Dept: [phone] and follow the prompts.   For any non-urgent questions, you may also contact your provider using MyChart. We now offer e-Visits for anyone 46 and older to request care online for non-urgent symptoms. For details visit [email or web address].   Also download the MyChart app! Go to the app store, search MyChart, open the app, select [facility], and log in with your MyChart username and password.

## 2024-11-10 LAB — KAPPA/LAMBDA LIGHT CHAINS
Kappa free light chain: 5 mg/L (ref 3.3–19.4)
Kappa, lambda light chain ratio: 1.32 (ref 0.26–1.65)
Lambda free light chains: 3.8 mg/L — ABNORMAL LOW (ref 5.7–26.3)

## 2024-11-11 LAB — MULTIPLE MYELOMA PANEL, SERUM
Albumin SerPl Elph-Mcnc: 3.5 g/dL (ref 2.9–4.4)
Albumin/Glob SerPl: 1.7 (ref 0.7–1.7)
Alpha 1: 0.2 g/dL (ref 0.0–0.4)
Alpha2 Glob SerPl Elph-Mcnc: 0.8 g/dL (ref 0.4–1.0)
B-Globulin SerPl Elph-Mcnc: 0.8 g/dL (ref 0.7–1.3)
Gamma Glob SerPl Elph-Mcnc: 0.3 g/dL — ABNORMAL LOW (ref 0.4–1.8)
Globulin, Total: 2.1 g/dL — ABNORMAL LOW (ref 2.2–3.9)
IgA: 10 mg/dL — ABNORMAL LOW (ref 64–422)
IgG (Immunoglobin G), Serum: 401 mg/dL — ABNORMAL LOW (ref 586–1602)
IgM (Immunoglobulin M), Srm: 20 mg/dL — ABNORMAL LOW (ref 26–217)
M Protein SerPl Elph-Mcnc: 0.1 g/dL — ABNORMAL HIGH
Total Protein ELP: 5.6 g/dL — ABNORMAL LOW (ref 6.0–8.5)

## 2024-11-14 DIAGNOSIS — N1831 Chronic kidney disease, stage 3a: Secondary | ICD-10-CM | POA: Diagnosis not present

## 2024-11-16 ENCOUNTER — Encounter: Payer: Self-pay | Admitting: Hematology

## 2024-11-21 ENCOUNTER — Other Ambulatory Visit: Payer: Self-pay

## 2024-11-21 DIAGNOSIS — C9000 Multiple myeloma not having achieved remission: Secondary | ICD-10-CM

## 2024-11-21 MED ORDER — LENALIDOMIDE 15 MG PO CAPS: 15.0000 mg | ORAL_CAPSULE | Freq: Every day | ORAL | 0 refills | Status: DC

## 2024-11-23 ENCOUNTER — Inpatient Hospital Stay: Attending: Hematology

## 2024-11-23 VITALS — BP 144/62 | HR 66 | Temp 98.2°F | Resp 14 | Wt 129.5 lb

## 2024-11-23 DIAGNOSIS — C9000 Multiple myeloma not having achieved remission: Secondary | ICD-10-CM | POA: Diagnosis present

## 2024-11-23 DIAGNOSIS — Z5112 Encounter for antineoplastic immunotherapy: Secondary | ICD-10-CM | POA: Diagnosis present

## 2024-11-23 DIAGNOSIS — Z79620 Long term (current) use of immunosuppressive biologic: Secondary | ICD-10-CM | POA: Insufficient documentation

## 2024-11-23 LAB — CBC WITH DIFFERENTIAL (CANCER CENTER ONLY)
Abs Immature Granulocytes: 0.01 K/uL (ref 0.00–0.07)
Basophils Absolute: 0.1 K/uL (ref 0.0–0.1)
Basophils Relative: 1 %
Eosinophils Absolute: 0.2 K/uL (ref 0.0–0.5)
Eosinophils Relative: 2 %
HCT: 33.8 % — ABNORMAL LOW (ref 36.0–46.0)
Hemoglobin: 11.4 g/dL — ABNORMAL LOW (ref 12.0–15.0)
Immature Granulocytes: 0 %
Lymphocytes Relative: 11 %
Lymphs Abs: 0.8 K/uL (ref 0.7–4.0)
MCH: 30.9 pg (ref 26.0–34.0)
MCHC: 33.7 g/dL (ref 30.0–36.0)
MCV: 91.6 fL (ref 80.0–100.0)
Monocytes Absolute: 0.8 K/uL (ref 0.1–1.0)
Monocytes Relative: 11 %
Neutro Abs: 5.3 K/uL (ref 1.7–7.7)
Neutrophils Relative %: 75 %
Platelet Count: 132 K/uL — ABNORMAL LOW (ref 150–400)
RBC: 3.69 MIL/uL — ABNORMAL LOW (ref 3.87–5.11)
RDW: 13 % (ref 11.5–15.5)
WBC Count: 7.1 K/uL (ref 4.0–10.5)
nRBC: 0 % (ref 0.0–0.2)

## 2024-11-23 MED ORDER — ACETAMINOPHEN 325 MG PO TABS
650.0000 mg | ORAL_TABLET | Freq: Once | ORAL | Status: AC
  Administered 2024-11-23: 650 mg via ORAL
  Filled 2024-11-23: qty 2

## 2024-11-23 MED ORDER — DIPHENHYDRAMINE HCL 25 MG PO CAPS
25.0000 mg | ORAL_CAPSULE | Freq: Once | ORAL | Status: AC
  Administered 2024-11-23: 25 mg via ORAL
  Filled 2024-11-23: qty 1

## 2024-11-23 MED ORDER — DARATUMUMAB-HYALURONIDASE-FIHJ 1800-30000 MG-UT/15ML ~~LOC~~ SOLN
1800.0000 mg | Freq: Once | SUBCUTANEOUS | Status: AC
  Administered 2024-11-23: 1800 mg via SUBCUTANEOUS
  Filled 2024-11-23: qty 15

## 2024-11-23 MED ORDER — DEXAMETHASONE 6 MG PO TABS
12.0000 mg | ORAL_TABLET | Freq: Once | ORAL | Status: AC
  Administered 2024-11-23: 12 mg via ORAL
  Filled 2024-11-23: qty 2

## 2024-11-23 NOTE — Patient Instructions (Signed)

## 2024-11-24 ENCOUNTER — Other Ambulatory Visit: Payer: Self-pay

## 2024-11-25 DIAGNOSIS — E1122 Type 2 diabetes mellitus with diabetic chronic kidney disease: Secondary | ICD-10-CM | POA: Diagnosis not present

## 2024-11-25 DIAGNOSIS — D631 Anemia in chronic kidney disease: Secondary | ICD-10-CM | POA: Diagnosis not present

## 2024-11-25 DIAGNOSIS — N1832 Chronic kidney disease, stage 3b: Secondary | ICD-10-CM | POA: Diagnosis not present

## 2024-11-25 DIAGNOSIS — I129 Hypertensive chronic kidney disease with stage 1 through stage 4 chronic kidney disease, or unspecified chronic kidney disease: Secondary | ICD-10-CM | POA: Diagnosis not present

## 2024-11-25 DIAGNOSIS — C9000 Multiple myeloma not having achieved remission: Secondary | ICD-10-CM | POA: Diagnosis not present

## 2024-11-25 DIAGNOSIS — N2581 Secondary hyperparathyroidism of renal origin: Secondary | ICD-10-CM | POA: Diagnosis not present

## 2024-11-25 DIAGNOSIS — R7989 Other specified abnormal findings of blood chemistry: Secondary | ICD-10-CM | POA: Diagnosis not present

## 2024-11-28 ENCOUNTER — Other Ambulatory Visit: Payer: Self-pay | Admitting: Cardiovascular Disease

## 2024-11-28 ENCOUNTER — Other Ambulatory Visit: Payer: Self-pay | Admitting: Family Medicine

## 2024-11-28 DIAGNOSIS — Z23 Encounter for immunization: Secondary | ICD-10-CM | POA: Diagnosis not present

## 2024-11-28 DIAGNOSIS — E1165 Type 2 diabetes mellitus with hyperglycemia: Secondary | ICD-10-CM

## 2024-11-30 ENCOUNTER — Telehealth: Payer: Self-pay

## 2024-11-30 NOTE — Telephone Encounter (Signed)
 RN attempted to call patient to schedule colonoscopy for which she is overdue; left vm informing patient of being overdue per MD recommendation as previously communicated with patient; requested a call back to schedule.

## 2024-12-02 ENCOUNTER — Encounter: Payer: Self-pay | Admitting: Cardiovascular Disease

## 2024-12-07 ENCOUNTER — Inpatient Hospital Stay: Admitting: Hematology

## 2024-12-07 ENCOUNTER — Inpatient Hospital Stay

## 2024-12-07 VITALS — BP 134/57 | HR 66 | Temp 97.7°F | Resp 16 | Wt 129.4 lb

## 2024-12-07 DIAGNOSIS — C9000 Multiple myeloma not having achieved remission: Secondary | ICD-10-CM

## 2024-12-07 DIAGNOSIS — Z5111 Encounter for antineoplastic chemotherapy: Secondary | ICD-10-CM | POA: Diagnosis not present

## 2024-12-07 DIAGNOSIS — Z5112 Encounter for antineoplastic immunotherapy: Secondary | ICD-10-CM | POA: Diagnosis not present

## 2024-12-07 LAB — CBC WITH DIFFERENTIAL (CANCER CENTER ONLY)
Abs Immature Granulocytes: 0.02 K/uL (ref 0.00–0.07)
Basophils Absolute: 0.1 K/uL (ref 0.0–0.1)
Basophils Relative: 2 %
Eosinophils Absolute: 0.3 K/uL (ref 0.0–0.5)
Eosinophils Relative: 6 %
HCT: 34.9 % — ABNORMAL LOW (ref 36.0–46.0)
Hemoglobin: 11.7 g/dL — ABNORMAL LOW (ref 12.0–15.0)
Immature Granulocytes: 0 %
Lymphocytes Relative: 20 %
Lymphs Abs: 0.9 K/uL (ref 0.7–4.0)
MCH: 30.2 pg (ref 26.0–34.0)
MCHC: 33.5 g/dL (ref 30.0–36.0)
MCV: 90.2 fL (ref 80.0–100.0)
Monocytes Absolute: 0.7 K/uL (ref 0.1–1.0)
Monocytes Relative: 14 %
Neutro Abs: 2.7 K/uL (ref 1.7–7.7)
Neutrophils Relative %: 58 %
Platelet Count: 215 K/uL (ref 150–400)
RBC: 3.87 MIL/uL (ref 3.87–5.11)
RDW: 13.2 % (ref 11.5–15.5)
WBC Count: 4.7 K/uL (ref 4.0–10.5)
nRBC: 0 % (ref 0.0–0.2)

## 2024-12-07 LAB — COMPREHENSIVE METABOLIC PANEL WITH GFR
ALT: 18 U/L (ref 0–44)
AST: 20 U/L (ref 15–41)
Albumin: 4.1 g/dL (ref 3.5–5.0)
Alkaline Phosphatase: 72 U/L (ref 38–126)
Anion gap: 10 (ref 5–15)
BUN: 38 mg/dL — ABNORMAL HIGH (ref 8–23)
CO2: 23 mmol/L (ref 22–32)
Calcium: 9.3 mg/dL (ref 8.9–10.3)
Chloride: 105 mmol/L (ref 98–111)
Creatinine, Ser: 1.3 mg/dL — ABNORMAL HIGH (ref 0.44–1.00)
GFR, Estimated: 42 mL/min — ABNORMAL LOW (ref >60)
Glucose, Bld: 107 mg/dL — ABNORMAL HIGH (ref 70–99)
Potassium: 3.8 mmol/L (ref 3.5–5.1)
Sodium: 139 mmol/L (ref 135–145)
Total Bilirubin: 0.3 mg/dL (ref 0.0–1.2)
Total Protein: 6.3 g/dL — ABNORMAL LOW (ref 6.5–8.1)

## 2024-12-07 MED ORDER — DEXAMETHASONE 6 MG PO TABS
12.0000 mg | ORAL_TABLET | Freq: Once | ORAL | Status: AC
  Administered 2024-12-07: 12:00:00 12 mg via ORAL
  Filled 2024-12-07: qty 2

## 2024-12-07 MED ORDER — DIPHENHYDRAMINE HCL 25 MG PO CAPS
25.0000 mg | ORAL_CAPSULE | Freq: Once | ORAL | Status: AC
  Administered 2024-12-07: 12:00:00 25 mg via ORAL
  Filled 2024-12-07: qty 1

## 2024-12-07 MED ORDER — DARATUMUMAB-HYALURONIDASE-FIHJ 1800-30000 MG-UT/15ML ~~LOC~~ SOLN
1800.0000 mg | Freq: Once | SUBCUTANEOUS | Status: AC
  Administered 2024-12-07: 13:00:00 1800 mg via SUBCUTANEOUS
  Filled 2024-12-07: qty 15

## 2024-12-07 MED ORDER — ACETAMINOPHEN 325 MG PO TABS
650.0000 mg | ORAL_TABLET | Freq: Once | ORAL | Status: AC
  Administered 2024-12-07: 12:00:00 650 mg via ORAL
  Filled 2024-12-07: qty 2

## 2024-12-07 NOTE — Patient Instructions (Signed)
 [facility] - A DEPT OF [facility]  Discharge Instructions: Thank you for choosing [facility] to provide your oncology and hematology care.   If you have a lab appointment with the Cancer Center, please go directly to the Cancer Center and check in at the registration area.   Wear comfortable clothing and clothing appropriate for easy access to any Portacath or PICC line.   We strive to give you quality time with your provider. You may need to reschedule your appointment if you arrive late (15 or more minutes).  Arriving late affects you and other patients whose appointments are after yours.  Also, if you miss three or more appointments without notifying the office, you may be dismissed from the clinic at the provider's discretion.      For prescription refill requests, have your pharmacy contact our office and allow 72 hours for refills to be completed.    Today you received the following chemotherapy and/or immunotherapy agents: daratumumab-hyaluronidase-fihj (DARZALEX FASPRO       To help prevent nausea and vomiting after your treatment, we encourage you to take your nausea medication as directed.  BELOW ARE SYMPTOMS THAT SHOULD BE REPORTED IMMEDIATELY: *FEVER GREATER THAN 100.4 F (38 C) OR HIGHER *CHILLS OR SWEATING *NAUSEA AND VOMITING THAT IS NOT CONTROLLED WITH YOUR NAUSEA MEDICATION *UNUSUAL SHORTNESS OF BREATH *UNUSUAL BRUISING OR BLEEDING *URINARY PROBLEMS (pain or burning when urinating, or frequent urination) *BOWEL PROBLEMS (unusual diarrhea, constipation, pain near the anus) TENDERNESS IN MOUTH AND THROAT WITH OR WITHOUT PRESENCE OF ULCERS (sore throat, sores in mouth, or a toothache) UNUSUAL RASH, SWELLING OR PAIN  UNUSUAL VAGINAL DISCHARGE OR ITCHING   Items with * indicate a potential emergency and should be followed up as soon as possible or go to the Emergency Department if any problems should occur.  Please show the  CHEMOTHERAPY ALERT CARD or IMMUNOTHERAPY ALERT CARD at check-in to the Emergency Department and triage nurse.  Should you have questions after your visit or need to cancel or reschedule your appointment, please contact [facility] - A DEPT OF [facility]  Dept: [phone]  and follow the prompts.  Office hours are 8:00 a.m. to 4:30 p.m. Monday - Friday. Please note that voicemails left after 4:00 p.m. may not be returned until the following business day.  We are closed weekends and major holidays. You have access to a nurse at all times for urgent questions. Please call the main number to the clinic Dept: [phone] and follow the prompts.   For any non-urgent questions, you may also contact your provider using MyChart. We now offer e-Visits for anyone 63 and older to request care online for non-urgent symptoms. For details visit [email or web address].   Also download the MyChart app! Go to the app store, search "MyChart", open the app, select [facility], and log in with your MyChart username and password.

## 2024-12-07 NOTE — Progress Notes (Signed)
 " HEMATOLOGY ONCOLOGY PROGRESS NOTE  Date of service: 12/07/2024  Patient Care Team: Antonio Meth, Jamee SAUNDERS, DO as PCP - General Delford Maude BROCKS, MD as PCP - Cardiology (Cardiology) Rosan Credit, MD as Consulting Physician (Ophthalmology) Obie Princella HERO, MD (Inactive) (Gastroenterology) Vernetta Lonni GRADE, MD as Consulting Physician (Orthopedic Surgery) Ivin Kocher, MD as Referring Physician (Dermatology) Onesimo Emaline Brink, MD as Consulting Physician (Hematology)  CHIEF COMPLAINT/PURPOSE OF CONSULTATION: Follow-up for continued evaluation and management of Multiple Myeloma  HISTORY OF PRESENTING ILLNESS: (2/13/20215) Cynthia Burns is a wonderful 78 y.o. female who was scheduled to see Dr. Andriette as a new patient and was referred to the ED for evaluation and management of newly diagnosed myeloma with rapidly worsening hgb and renal insuffiencey with proteinuria. High suspicious of Multiple myeloma.    Outside labs done at Dr Ephriam Singh's office (available in referral under media) show M spike of 6.1g/dl and Kappa light chains in the 400's.   Patient is accompanied by her husband and her daughter at bed side during the visit. Patient notes she has been having significantly more fatigue. Has required PRBC transfusions for symptomatic anemia.   She complains of fatigue, right ankle pain, and unexpected weight loss of around 25-30 lbs due to appetite loss in the past year. Her daughter notes that the patient has been confused more often as well.    She denies any new infection issues, fever, chills, back pain, chest pain, abdominal pain, or leg swelling.    Patient notes she tested positive for COVID-19 infection in August 2022 and was prescribed Paxlovid . However, she did not tolerate the medication due to allergic reaction.   PmHx of hypertension. She has been taking Losartan  and hydralazine .   Surgery history: Hysterectomy and Cholecystectomy.    She was  started on dexamethasone  20 mg daily x 4 doses yesterday. She has been tolerating the high-dose steroid well.    Cynthia Burns is a 77 y.o. female here for evaluation and management of newly diagnosed myeloma with rapidly worsening hgb and renal insuffiencey with proteinuria.    Patient was seen by me in the ED on 01/30/2024 and complained of significantly worsened fatigue as well as right ankle pain, increased confusion, unexpected weight loss of about 25-30 pounds in 1 year due to appetite loss.   Today, she is accompanied by her husband and daughter. Her husband reports that patient has been feeling extremely weak since being discharged from the hospital. Patient reports no other new symptoms since her inpatient visit.    Patient's husband reports that patient was started on 0.5 MG Clonazepam  by Dr. Austria in the hospital for anxiety management. Her husband reports that Clonazepam  caused sedation, so it has been cut in half.    Patient's husband reports that her blood pressure generally fluctuates at home. Her blood pressure in clinic today is 161/74. She notes that she takes her blood pressure medication at lunch time. She reports one incident in which her blood pressure was over 200, though it has not been persistent at that level.   Patient reports that her baseline weight one year ago was 136 pounds. She did lose about 32 pounds recently, but she has gained back 12 pounds. Her current weight is 116 pounds.    She has not received the latest COVID-19 vaccine. Patient is otherwise UTD with her age-appropriate vaccines, including flu, RSV, shingles, and pneumonia.    She notes that she was infected with COVID-19 3x previously.  Patient does endorse some lightheadedness. She denies any back pain or abdominal pain.    Pt reports a hx of prolapsed bladder, which is not sympomatically bothersome.    Patient reports allergies to several medications.    She does stay regularly hydrated by  consuming nearly 3L of water daily.    Patient will have a kidney biopsy on 02/18/2024.  She will see her kidney doctor next on 02/24/2024.    Patient reports that her PCP has started her on Rybelsus  for DM management. She reports that her next visit with her PCP is 02/25/2024.        SUMMARY OF ONCOLOGIC HISTORY: Oncology History  Multiple myeloma (HCC)  01/29/2024 Initial Diagnosis   Multiple myeloma (HCC)   02/23/2024 -  Chemotherapy   Patient is on Treatment Plan : MYELOMA DaraCyBorD (Daratumumab  SQ + Cyclophosphamide PO + Bortezomib  SQ + Dexamethasone  PO) q28d x 8 cycles / Daratumumab  SQ q28d     03/14/2024 Cancer Staging   Staging form: Plasma Cell Myeloma and Plasma Cell Disorders, AJCC 8th Edition - Clinical: High-risk cytogenetics: Absent - Signed by Onesimo Emaline Brink, MD on 03/14/2024 Stage prefix: Initial diagnosis Cytogenetics: t(11;14) translocation     INTERVAL HISTORY:  Cynthia Burns is a 78 y.o. female who is here today for continued evaluation and management of multiple myeloma. She is accompanied by her husband and her daughter was on the phone.  she was last seen by me on 11/09/2024; at the time she did not have any concerns and was doing well.   Today, she feeling well overall.  She notes that she has recently had her COVID vaccine and states that she has not had any side effects.  She states that the steroid medication is making her jittery.  No fevers/chills/night sweats/weight loss.  REVIEW OF SYSTEMS:   10 Point review of systems of done and is negative except as noted above.  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   HSV-1 infection    HYPERTENSION    Hypothyroid     MITRAL VALVE PROLAPSE    OSTEOPENIA    Overweight(278.02)    PHARYNGITIS, ACUTE     SURGICAL HISTORY Past Surgical History:  Procedure Laterality Date   ABDOMINAL HYSTERECTOMY     CHOLECYSTECTOMY     prolped bladder     WISDOM TOOTH EXTRACTION      SOCIAL HISTORY Social History[1]  Social History   Social History Narrative   Not on file    SOCIAL DRIVERS OF HEALTH SDOH Screenings   Food Insecurity: Patient Declined (02/29/2024)  Housing: Patient Declined (02/29/2024)  Transportation Needs: No Transportation Needs (02/29/2024)  Utilities: Not At Risk (02/29/2024)  Alcohol Screen: Low Risk (07/24/2023)  Depression (PHQ2-9): Low Risk (11/23/2024)  Financial Resource Strain: Low Risk (12/07/2023)  Physical Activity: Insufficiently Active (12/07/2023)  Social Connections: Socially Integrated (02/29/2024)  Stress: No Stress Concern Present (12/07/2023)  Tobacco Use: Low Risk (03/21/2024)  Health Literacy: Adequate Health Literacy (07/31/2023)     FAMILY HISTORY Family History  Problem Relation Age of Onset   Hypertension Mother    Heart disease Mother  Had a stent   Emphysema Father    Diabetes Father    COPD Father    Stroke Sister    Breast cancer Neg Hx      ALLERGIES: is allergic to mucinex [guaifenesin er], bystolic  [nebivolol  hcl], calcium -containing compounds, codeine, paxlovid  [nirmatrelvir -ritonavir ], advicor [niacin-lovastatin er], amlodipine, lisinopril-hydrochlorothiazide, other, ramipril, and sulfonamide derivatives.  MEDICATIONS  Current Outpatient Medications  Medication Sig Dispense Refill   acyclovir  (ZOVIRAX ) 400 MG tablet TAKE 1 TABLET BY MOUTH TWICE A DAY 180 tablet 1   amoxicillin  (AMOXIL ) 500 MG capsule Take 2,000 mg by mouth See admin instructions. Take 2,000 mg by mouth one hour prior to dental visits     aspirin  EC 81 MG tablet Take 81 mg by mouth daily. Swallow whole.     Biotin 5000 MCG CAPS Take 5,000 mcg by mouth daily.      Cholecalciferol (VITAMIN D3) 1000 units CAPS Take 1,000 Units by mouth daily.     CRESTOR  20 MG tablet TAKE ONE TABLET BY MOUTH EVERY DAY 90 tablet 0   Cyanocobalamin  (VITAMIN B-12) 3000 MCG SUBL Place 3,000 mcg under the tongue daily in the afternoon.     fexofenadine (ALLEGRA) 180 MG tablet Take 180 mg by mouth daily.     insulin  aspart (NOVOLOG  FLEXPEN) 100 UNIT/ML FlexPen Per sliding scale max 40 u daily 15 mL 3   lenalidomide  (REVLIMID ) 15 MG capsule Take 1 capsule (15 mg total) by mouth daily. Take 1 capsule (15 mg total) by mouth daily for 21 days and then take 7 days off. 21 capsule 0   levothyroxine  (SYNTHROID ) 25 MCG tablet TAKE ONE TABLET BY MOUTH EVERY DAY BEFORE BREAKFAST 90 tablet 0   Multiple Vitamins-Minerals (PRESERVISION AREDS 2) CAPS Take 1 capsule by mouth 2 (two) times daily with a meal.     ondansetron  (ZOFRAN ) 8 MG tablet Take 8 mg by mouth 30 to 60 min prior to Cyclophosphamide administration then take 8 mg every 8 hrs as needed for nausea and vomiting. 30 tablet 1   pantoprazole  (PROTONIX ) 40 MG tablet Pantoprazole  40 mg before breakfast and dinner for 90 days with 3 refills (Patient taking differently: Take 40 mg by mouth See admin instructions. Take 40 mg by mouth 30 minutes before lunch and supper) 180 tablet 3   prochlorperazine  (COMPAZINE ) 10 MG tablet Take 1 tablet (10 mg total) by mouth every 6 (six) hours as needed for nausea or vomiting. 30 tablet 1   RYBELSUS  7 MG TABS TAKE ONE TABLET BY MOUTH EVERY DAY - (TAKE WITH NO MORE THAN 4 OUNCES OF PLAIN WATER AT LEAST 30 MINUTES BEFORE THE FIRST FOOD, BEVERAGE, OR OTHER MEDICATIONS OF THE DAY) 90 tablet 0   SYSTANE ULTRA PF 0.4-0.3 % SOLN Place 1 drop into both eyes See admin instructions. Instill 1 drop into both eyes one to four times a day     Blood Glucose Monitoring Suppl (ONE TOUCH ULTRA 2) w/Device KIT CHECK BLOOD SUGAR TWICE DAILY.  DX CODE  E11.9 1 kit 0   dexamethasone  (DECADRON ) 4 MG tablet TAKE FIVE TABLETS BY  MOUTH EVERY DAY WITH BREAKFAST THE DAY AFTER EVERY DARATUMUMAB  DOSE 30 tablet 1   DROPLET PEN NEEDLES 31G X 5 MM MISC USE ONE NEEDLE SUBCUTANEOUSLY AS DIRECTED USE WITH NOVOLOG  (REMOVE AND DISCARD NEEDLE IN SHARPS CONTAINER IMMEDIATELY AFTER USE) 100 each 2   glucose blood (ONETOUCH ULTRA TEST) test strip USE ONE STRIP TO TEST AS DIRECTED - (KEEP UNUSED STRIPS IN ORIGINAL SEALED  CONTAINER BETWEEN USES) 200 each 3   [Paused] hydrALAZINE  (APRESOLINE ) 25 MG tablet Take 25 mg by mouth See admin instructions. Take 25 mg by mouth at 8 AM, 3 PM, and 11 PM- in conjunction with one 50 mg tablet to equal a total dose of 75 mg (Patient not taking: Reported on 12/07/2024)     Lancets (ONETOUCH DELICA PLUS LANCET33G) MISC USE 1 LANCET TO TEST AS DIRECTED (DISCARD LANCET IN SHARPS CONTAINER IMMEDIATELY AFTER USE) 200 each 3   No current facility-administered medications for this visit.    PHYSICAL EXAMINATION: ECOG PERFORMANCE STATUS: 2 - Symptomatic, <50% confined to bed VITALS: Vitals:   12/07/24 1105 12/07/24 1106  BP: (!) 149/60 (!) 134/57  Pulse: 66   Resp: 16   Temp: 97.7 F (36.5 C)   SpO2: 99%    Filed Weights   12/07/24 1105  Weight: 129 lb 6.4 oz (58.7 kg)   Body mass index is 24.45 kg/m.  GENERAL: alert, in no acute distress and comfortable SKIN: no acute rashes, no significant lesions EYES: conjunctiva are pink and non-injected, sclera anicteric OROPHARYNX: MMM, no exudates, no oropharyngeal erythema or ulceration NECK: supple, no JVD LYMPH:  no palpable lymphadenopathy in the cervical, axillary or inguinal regions LUNGS: clear to auscultation b/l with normal respiratory effort HEART: regular rate & rhythm ABDOMEN:  normoactive bowel sounds , non tender, not distended, no hepatosplenomegaly Extremity: no pedal edema PSYCH: alert & oriented x 3 with fluent speech NEURO: no focal motor/sensory deficits  LABORATORY DATA:   I have reviewed the data as listed     Latest Ref Rng  & Units 12/07/2024   10:00 AM 11/23/2024   11:18 AM 11/09/2024   10:07 AM  CBC EXTENDED  WBC 4.0 - 10.5 K/uL 4.7  7.1  6.3   RBC 3.87 - 5.11 MIL/uL 3.87  3.69  3.55   Hemoglobin 12.0 - 15.0 g/dL 88.2  88.5  88.7   HCT 36.0 - 46.0 % 34.9  33.8  33.1   Platelets 150 - 400 K/uL 215  132  179   NEUT# 1.7 - 7.7 K/uL 2.7  5.3  4.4   Lymph# 0.7 - 4.0 K/uL 0.9  0.8  0.8         Latest Ref Rng & Units 12/07/2024   10:00 AM 11/09/2024   10:07 AM 10/12/2024    8:37 AM  CMP  Glucose 70 - 99 mg/dL 892  845  825   BUN 8 - 23 mg/dL 38  44  46   Creatinine 0.44 - 1.00 mg/dL 8.69  8.67  8.61   Sodium 135 - 145 mmol/L 139  138  139   Potassium 3.5 - 5.1 mmol/L 3.8  3.6  3.8   Chloride 98 - 111 mmol/L 105  103  107   CO2 22 - 32 mmol/L 23  24  26    Calcium  8.9 - 10.3 mg/dL 9.3  8.9  8.8   Total Protein 6.5 - 8.1 g/dL 6.3  5.9  5.8   Total Bilirubin 0.0 - 1.2 mg/dL 0.3  0.3  0.4   Alkaline Phos 38 - 126 U/L 72  65  59   AST 15 - 41 U/L 20  20  17    ALT 0 - 44 U/L 18  18  17     Multiple Myeloma Panel: Component     Latest Ref Rng 11/09/2024  IgG (Immunoglobin G), Serum     586 - 1,602 mg/dL 598 (L)  IgA     64 - 422 mg/dL 10 (L)   IgM (Immunoglobulin M), Srm     26 - 217 mg/dL 20 (L)   Total Protein ELP     6.0 - 8.5 g/dL 5.6 (L) (C)  Albumin SerPl Elph-Mcnc     2.9 - 4.4 g/dL 3.5 (C)  Alpha 1     0.0 - 0.4 g/dL 0.2 (C)  Alpha2 Glob SerPl Elph-Mcnc     0.4 - 1.0 g/dL 0.8 (C)  B-Globulin SerPl Elph-Mcnc     0.7 - 1.3 g/dL 0.8 (C)  Gamma Glob SerPl Elph-Mcnc     0.4 - 1.8 g/dL 0.3 (L) (C)  M Protein SerPl Elph-Mcnc     Not Observed g/dL 0.1 (H) (C)  Globulin, Total     2.2 - 3.9 g/dL 2.1 (L) (C)  Albumin/Glob SerPl     0.7 - 1.7  1.7 (C)  IFE 1 Comment ! (C)  Please Note (HCV): Comment (C)    Legend: (L) Low (H) High ! Abnormal (C) Corrected  Kappa/Lambda Light Chain Component     Latest Ref Rng 11/09/2024  Kappa free light chain     3.3 - 19.4 mg/L 5.0   Lambda  free light chains     5.7 - 26.3 mg/L 3.8 (L)   Kappa, lambda light chain ratio     0.26 - 1.65  1.32     Legend: (L) Low   MULTIPLE MYELOMA & KAPPA/LAMBDA LIGHT CHAINS 04/2024 - 09/2024     RADIOGRAPHIC STUDIES: I have personally reviewed the radiological images as listed and agreed with the findings in the report. MM 3D SCREENING MAMMOGRAM BILATERAL BREAST Result Date: 11/01/2024 CLINICAL DATA:  Screening. EXAM: DIGITAL SCREENING BILATERAL MAMMOGRAM WITH TOMOSYNTHESIS AND CAD TECHNIQUE: Bilateral screening digital craniocaudal and mediolateral oblique mammograms were obtained. Bilateral screening digital breast tomosynthesis was performed. The images were evaluated with computer-aided detection. COMPARISON:  Previous exam(s). ACR Breast Density Category b: There are scattered areas of fibroglandular density. FINDINGS: There are no findings suspicious for malignancy. IMPRESSION: No mammographic evidence of malignancy. A result letter of this screening mammogram will be mailed directly to the patient. RECOMMENDATION: Screening mammogram in one year. (Code:SM-B-01Y) BI-RADS CATEGORY  1: Negative. Electronically Signed   By: Craig Farr M.D.   On: 11/01/2024 08:06    ASSESSMENT & PLAN:  78 y.o. female with  Multiple myeloma - Lambda restricted plasma cell neoplasm comprising greater than 75% of  the cellular marrow  Molecular Cyto t(11;14) PET/CT with no bone lesions   2. Acute on CKD - resolved to baseline   3. HTN   4. DM2   5. Hypothyroidism  PLAN: - Discussed lab results on 12/07/2024 in detail with patient: - CMP   - Creatinine : 1.30 - Calcium : 9.3 - CBC  - Hemoglobin: 11.7  - WBC: 4.7  - Platelets: 215  - Multiple Myeloma  (11/09/24)   -M protein: 0.1 - Kappa/Lambda Light Chains (11/09/2024)  - Lambda free light chains: 3.8  -patient notes no new toxicities from her DRd regimen at this time . -appropriate to proceed current treatment plan with same  supportive  medications. -continue Acyclovir  and ASA 81mg  -BM Bx as schedule to evaluate for post induction treatment response  -will plan to change to maintenance Revlimid  if good treatment response consistent with labs   FOLLOW-UP F/u as per currently scheduled appointments for BM Bx, dara and MD visit The total time spent in the appointment was  30 minutes* .  All of the patient's questions were answered and the patient knows to call the clinic with any problems, questions, or concerns.  Emaline Saran MD MS AAHIVMS [facility] Hematology/Oncology Physician [facility]  *Total Encounter Time as defined by the Centers for Medicare and Medicaid Services includes, in addition to the face-to-face time of a patient visit (documented in the note above) non-face-to-face time: obtaining and reviewing outside history, ordering and reviewing medications, tests or procedures, care coordination (communications with other health care professionals or caregivers) and documentation in the medical record.  I, Marijo Sharps, acting as a neurosurgeon for Emaline Saran, MD.,have documented all relevant documentation on the behalf of Emaline Saran, MD,as directed by  Emaline Saran, MD while in the presence of Emaline Saran, MD.  I have reviewed the above documentation for accuracy and completeness, and I agree with the above.  Emaline Saran, MD      [1]  Social History Tobacco Use   Smoking status: Never   Smokeless tobacco: Never  Vaping Use   Vaping status: Never Used  Substance Use Topics   Alcohol use: No    Alcohol/week: 0.0 standard drinks of alcohol   Drug use: No   "

## 2024-12-08 LAB — KAPPA/LAMBDA LIGHT CHAINS
Kappa free light chain: 5.1 mg/L (ref 3.3–19.4)
Kappa, lambda light chain ratio: 0.85 (ref 0.26–1.65)
Lambda free light chains: 6 mg/L (ref 5.7–26.3)

## 2024-12-10 LAB — MULTIPLE MYELOMA PANEL, SERUM
Albumin SerPl Elph-Mcnc: 3.7 g/dL (ref 2.9–4.4)
Albumin/Glob SerPl: 1.8 — ABNORMAL HIGH (ref 0.7–1.7)
Alpha 1: 0.2 g/dL (ref 0.0–0.4)
Alpha2 Glob SerPl Elph-Mcnc: 0.8 g/dL (ref 0.4–1.0)
B-Globulin SerPl Elph-Mcnc: 0.8 g/dL (ref 0.7–1.3)
Gamma Glob SerPl Elph-Mcnc: 0.3 g/dL — ABNORMAL LOW (ref 0.4–1.8)
Globulin, Total: 2.1 g/dL — ABNORMAL LOW (ref 2.2–3.9)
IgA: 9 mg/dL — ABNORMAL LOW (ref 64–422)
IgG (Immunoglobin G), Serum: 415 mg/dL — ABNORMAL LOW (ref 586–1602)
IgM (Immunoglobulin M), Srm: 17 mg/dL — ABNORMAL LOW (ref 26–217)
M Protein SerPl Elph-Mcnc: 0.1 g/dL — ABNORMAL HIGH
Total Protein ELP: 5.8 g/dL — ABNORMAL LOW (ref 6.0–8.5)

## 2024-12-17 ENCOUNTER — Encounter: Payer: Self-pay | Admitting: Hematology

## 2024-12-20 ENCOUNTER — Other Ambulatory Visit: Payer: Self-pay

## 2024-12-20 DIAGNOSIS — C9000 Multiple myeloma not having achieved remission: Secondary | ICD-10-CM

## 2024-12-20 MED ORDER — LENALIDOMIDE 15 MG PO CAPS: 15.0000 mg | ORAL_CAPSULE | Freq: Every day | ORAL | 0 refills | Status: DC

## 2024-12-21 ENCOUNTER — Inpatient Hospital Stay

## 2024-12-21 VITALS — BP 148/65 | HR 71 | Temp 97.9°F | Resp 16 | Ht 61.0 in | Wt 126.0 lb

## 2024-12-21 DIAGNOSIS — Z5112 Encounter for antineoplastic immunotherapy: Secondary | ICD-10-CM | POA: Diagnosis not present

## 2024-12-21 DIAGNOSIS — C9000 Multiple myeloma not having achieved remission: Secondary | ICD-10-CM

## 2024-12-21 LAB — CBC WITH DIFFERENTIAL (CANCER CENTER ONLY)
Abs Immature Granulocytes: 0.03 K/uL (ref 0.00–0.07)
Basophils Absolute: 0.1 K/uL (ref 0.0–0.1)
Basophils Relative: 1 %
Eosinophils Absolute: 0.4 K/uL (ref 0.0–0.5)
Eosinophils Relative: 8 %
HCT: 32.2 % — ABNORMAL LOW (ref 36.0–46.0)
Hemoglobin: 11 g/dL — ABNORMAL LOW (ref 12.0–15.0)
Immature Granulocytes: 1 %
Lymphocytes Relative: 14 %
Lymphs Abs: 0.6 K/uL — ABNORMAL LOW (ref 0.7–4.0)
MCH: 30.5 pg (ref 26.0–34.0)
MCHC: 34.2 g/dL (ref 30.0–36.0)
MCV: 89.2 fL (ref 80.0–100.0)
Monocytes Absolute: 0.7 K/uL (ref 0.1–1.0)
Monocytes Relative: 15 %
Neutro Abs: 2.8 K/uL (ref 1.7–7.7)
Neutrophils Relative %: 61 %
Platelet Count: 122 K/uL — ABNORMAL LOW (ref 150–400)
RBC: 3.61 MIL/uL — ABNORMAL LOW (ref 3.87–5.11)
RDW: 13.6 % (ref 11.5–15.5)
WBC Count: 4.6 K/uL (ref 4.0–10.5)
nRBC: 0 % (ref 0.0–0.2)

## 2024-12-21 MED ORDER — DEXAMETHASONE 6 MG PO TABS
12.0000 mg | ORAL_TABLET | Freq: Once | ORAL | Status: AC
  Administered 2024-12-21: 12 mg via ORAL
  Filled 2024-12-21: qty 2

## 2024-12-21 MED ORDER — DARATUMUMAB-HYALURONIDASE-FIHJ 1800-30000 MG-UT/15ML ~~LOC~~ SOLN
1800.0000 mg | Freq: Once | SUBCUTANEOUS | Status: AC
  Administered 2024-12-21: 1800 mg via SUBCUTANEOUS
  Filled 2024-12-21: qty 15

## 2024-12-21 MED ORDER — ACETAMINOPHEN 325 MG PO TABS
650.0000 mg | ORAL_TABLET | Freq: Once | ORAL | Status: AC
  Administered 2024-12-21: 650 mg via ORAL
  Filled 2024-12-21: qty 2

## 2024-12-21 MED ORDER — DIPHENHYDRAMINE HCL 25 MG PO CAPS
25.0000 mg | ORAL_CAPSULE | Freq: Once | ORAL | Status: AC
  Administered 2024-12-21: 25 mg via ORAL
  Filled 2024-12-21: qty 1

## 2024-12-21 NOTE — Patient Instructions (Signed)
 [facility] - A DEPT OF [facility]  Discharge Instructions: Thank you for choosing [facility] to provide your oncology and hematology care.   If you have a lab appointment with the Cancer Center, please go directly to the Cancer Center and check in at the registration area.   Wear comfortable clothing and clothing appropriate for easy access to any Portacath or PICC line.   We strive to give you quality time with your provider. You may need to reschedule your appointment if you arrive late (15 or more minutes).  Arriving late affects you and other patients whose appointments are after yours.  Also, if you miss three or more appointments without notifying the office, you may be dismissed from the clinic at the provider's discretion.      For prescription refill requests, have your pharmacy contact our office and allow 72 hours for refills to be completed.    Today you received the following chemotherapy and/or immunotherapy agents: Daratumumab -hyaluronidase -fihj (Darzalex  faspro)    To help prevent nausea and vomiting after your treatment, we encourage you to take your nausea medication as directed.  BELOW ARE SYMPTOMS THAT SHOULD BE REPORTED IMMEDIATELY: *FEVER GREATER THAN 100.4 F (38 C) OR HIGHER *CHILLS OR SWEATING *NAUSEA AND VOMITING THAT IS NOT CONTROLLED WITH YOUR NAUSEA MEDICATION *UNUSUAL SHORTNESS OF BREATH *UNUSUAL BRUISING OR BLEEDING *URINARY PROBLEMS (pain or burning when urinating, or frequent urination) *BOWEL PROBLEMS (unusual diarrhea, constipation, pain near the anus) TENDERNESS IN MOUTH AND THROAT WITH OR WITHOUT PRESENCE OF ULCERS (sore throat, sores in mouth, or a toothache) UNUSUAL RASH, SWELLING OR PAIN  UNUSUAL VAGINAL DISCHARGE OR ITCHING   Items with * indicate a potential emergency and should be followed up as soon as possible or go to the Emergency Department if any problems should occur.  Please show the  CHEMOTHERAPY ALERT CARD or IMMUNOTHERAPY ALERT CARD at check-in to the Emergency Department and triage nurse.  Should you have questions after your visit or need to cancel or reschedule your appointment, please contact [facility] - A DEPT OF [facility]  Dept: [phone]  and follow the prompts.  Office hours are 8:00 a.m. to 4:30 p.m. Monday - Friday. Please note that voicemails left after 4:00 p.m. may not be returned until the following business day.  We are closed weekends and major holidays. You have access to a nurse at all times for urgent questions. Please call the main number to the clinic Dept: [phone] and follow the prompts.   For any non-urgent questions, you may also contact your provider using MyChart. We now offer e-Visits for anyone 46 and older to request care online for non-urgent symptoms. For details visit [email or web address].   Also download the MyChart app! Go to the app store, search MyChart, open the app, select [facility], and log in with your MyChart username and password.

## 2024-12-23 ENCOUNTER — Other Ambulatory Visit: Payer: Self-pay | Admitting: Radiology

## 2024-12-23 DIAGNOSIS — C9000 Multiple myeloma not having achieved remission: Secondary | ICD-10-CM

## 2024-12-26 NOTE — H&P (Signed)
 "  Chief Complaint: Multiple myeloma; referred for image guided bone marrow biopsy to assess response to treatment  Referring Provider(s): Kale,G  Supervising Physician: Hughes Simmonds  Patient Status: [facility] - Out-pt  History of Present Illness: Cynthia Burns is a 79 y.o. female with PMH sig for anemia, CKD, DM, heart aneurysm, HTN, HSV, hypothyroidism, MVP and multiple myeloma. She is scheduled today for image guided bone marrow biopsy to assess response to treatment.    Patient is Full Code  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   HSV-1 infection    HYPERTENSION    Hypothyroid    MITRAL VALVE PROLAPSE    OSTEOPENIA    Overweight(278.02)    PHARYNGITIS, ACUTE     Past Surgical History:  Procedure Laterality Date   ABDOMINAL HYSTERECTOMY     CHOLECYSTECTOMY     prolped bladder     WISDOM TOOTH EXTRACTION      Allergies: Mucinex [guaifenesin er], Bystolic  [nebivolol  hcl], Calcium -containing compounds, Codeine, Paxlovid  [nirmatrelvir -ritonavir ], Advicor [niacin-lovastatin er], Amlodipine, Lisinopril-hydrochlorothiazide, Other, Ramipril, and Sulfonamide derivatives  Medications: Prior to Admission medications  Medication Sig Start Date End Date Taking? Authorizing Provider  acyclovir  (ZOVIRAX ) 400 MG tablet TAKE 1 TABLET BY MOUTH TWICE A DAY 10/12/24   Onesimo Emaline Brink, MD  amoxicillin  (AMOXIL ) 500 MG capsule Take 2,000 mg by mouth See admin instructions. Take 2,000 mg by mouth one hour prior to dental visits    [provider]  aspirin  EC 81 MG tablet Take 81 mg by mouth daily. Swallow whole.    [provider]  Biotin 5000 MCG CAPS Take 5,000 mcg by mouth daily.    [provider]  Blood Glucose Monitoring Suppl (ONE TOUCH ULTRA 2) w/Device KIT CHECK BLOOD SUGAR TWICE DAILY.  DX CODE  E11.9 08/23/19   Lowne Chase, Yvonne R, DO  Cholecalciferol (VITAMIN D3) 1000 units CAPS Take 1,000 Units by mouth daily.    [provider]  CRESTOR  20 MG tablet TAKE ONE TABLET BY MOUTH EVERY DAY 11/30/24   Nishan, Peter C, MD  Cyanocobalamin  (VITAMIN B-12) 3000 MCG SUBL Place 3,000 mcg under the tongue daily in the afternoon.    [provider]  dexamethasone  (DECADRON ) 4 MG tablet TAKE FIVE TABLETS BY MOUTH EVERY DAY WITH BREAKFAST THE DAY AFTER EVERY DARATUMUMAB  DOSE 07/15/24   Kale, Gautam Kishore, MD  DROPLET PEN NEEDLES 31G X 5 MM MISC USE ONE NEEDLE SUBCUTANEOUSLY AS DIRECTED USE WITH NOVOLOG  (REMOVE AND DISCARD NEEDLE IN SHARPS CONTAINER IMMEDIATELY AFTER USE) 11/28/24   Webb, Padonda B, FNP  fexofenadine (ALLEGRA) 180 MG tablet Take 180 mg by mouth daily.    [provider]  glucose blood (ONETOUCH ULTRA TEST) test strip USE ONE STRIP TO TEST AS DIRECTED - (KEEP UNUSED STRIPS IN ORIGINAL SEALED CONTAINER BETWEEN USES) 09/27/24   Antonio Meth, Jamee SAUNDERS, DO  [Paused] hydrALAZINE  (APRESOLINE ) 25 MG tablet Take 25 mg by mouth See admin instructions. Take 25 mg by mouth at 8 AM, 3 PM, and 11 PM- in conjunction with one 50 mg tablet to equal a total dose of 75 mg Patient not taking: Reported on  12/07/2024 Wait to take this until your doctor or other care provider tells you to start again.    [provider]  insulin  aspart (NOVOLOG  FLEXPEN) 100 UNIT/ML FlexPen Per sliding scale max 40 u daily 05/19/24   Antonio Meth, Yvonne R, DO  Lancets (ONETOUCH DELICA PLUS [place]) MISC USE 1 LANCET TO TEST AS DIRECTED (DISCARD LANCET IN SHARPS CONTAINER IMMEDIATELY AFTER USE) 09/27/24   Antonio Meth Jamee JONELLE, DO  lenalidomide  (REVLIMID ) 15 MG capsule Take 1 capsule (15 mg total) by mouth daily. Take 1 capsule (15 mg total) by mouth daily for 21 days and then take 7 days  off. 12/20/24   Onesimo Emaline Brink, MD  levothyroxine  (SYNTHROID ) 25 MCG tablet TAKE ONE TABLET BY MOUTH EVERY DAY BEFORE BREAKFAST 09/27/24   Lowne Chase, Yvonne R, DO  Multiple Vitamins-Minerals (PRESERVISION AREDS 2) CAPS Take 1 capsule by mouth 2 (two) times daily with a meal.    [provider]  ondansetron  (ZOFRAN ) 8 MG tablet Take 8 mg by mouth 30 to 60 min prior to Cyclophosphamide administration then take 8 mg every 8 hrs as needed for nausea and vomiting. 05/04/24   Onesimo Emaline Brink, MD  pantoprazole  (PROTONIX ) 40 MG tablet Pantoprazole  40 mg before breakfast and dinner for 90 days with 3 refills Patient taking differently: Take 40 mg by mouth See admin instructions. Take 40 mg by mouth 30 minutes before lunch and supper 01/29/24   Nandigam, Kavitha V, MD  prochlorperazine  (COMPAZINE ) 10 MG tablet Take 1 tablet (10 mg total) by mouth every 6 (six) hours as needed for nausea or vomiting. 05/04/24   Onesimo Emaline Brink, MD  RYBELSUS  7 MG TABS TAKE ONE TABLET BY MOUTH EVERY DAY - (TAKE WITH NO MORE THAN 4 OUNCES OF PLAIN WATER AT LEAST 30 MINUTES BEFORE THE FIRST FOOD, BEVERAGE, OR OTHER MEDICATIONS OF THE DAY) 11/28/24   Douglass Caul B, FNP  SYSTANE ULTRA PF 0.4-0.3 % SOLN Place 1 drop into both eyes See admin instructions. Instill 1 drop into both eyes one to four times a day    [provider]     Family History  Problem Relation Age of Onset   Hypertension Mother    Heart disease Mother        Had a stent   Emphysema Father    Diabetes Father    COPD Father    Stroke Sister    Breast cancer Neg Hx     Social History   Socioeconomic History   Marital status: Married    Spouse name: Not on file   Number of children: Not on file   Years of education: Not on file   Highest education level: Associate degree: occupational, scientist, product/process development, or vocational program  Occupational History   Occupation: hairdresser  Tobacco Use   Smoking status: Never   Smokeless  tobacco: Never  Vaping Use   Vaping status: Never Used  Substance and Sexual Activity   Alcohol use: No    Alcohol/week: 0.0 standard drinks of alcohol   Drug use: No   Sexual activity: Not Currently    Partners: Male    Birth control/protection: Surgical, Abstinence    Comment: hysterectomy, 17, less than 5  Other Topics Concern   Not on file  Social History Narrative   Not on file   Social Drivers of Health   Tobacco Use: Low Risk (03/21/2024)   Patient History    Smoking Tobacco Use: Never    Smokeless Tobacco Use:  Never    Passive Exposure: Not on file  Financial Resource Strain: Low Risk (12/07/2023)   Overall Financial Resource Strain (CARDIA)    Difficulty of Paying Living Expenses: Not hard at all  Food Insecurity: Patient Declined (02/29/2024)   Hunger Vital Sign    Worried About Running Out of Food in the Last Year: Patient declined    Ran Out of Food in the Last Year: Patient declined  Transportation Needs: No Transportation Needs (02/29/2024)   PRAPARE - Administrator, Civil Service (Medical): No    Lack of Transportation (Non-Medical): No  Physical Activity: Insufficiently Active (12/07/2023)   Exercise Vital Sign    Days of Exercise per Week: 1 day    Minutes of Exercise per Session: 10 min  Stress: No Stress Concern Present (12/07/2023)   [employer] of Occupational Health - Occupational Stress Questionnaire    Feeling of Stress : Only a little  Social Connections: Socially Integrated (02/29/2024)   Social Connection and Isolation Panel    Frequency of Communication with Friends and Family: More than three times a week    Frequency of Social Gatherings with Friends and Family: More than three times a week    Attends Religious Services: More than 4 times per year    Active Member of Clubs or Organizations: Yes    Attends Banker Meetings: More than 4 times per year    Marital Status: Married  Depression (PHQ2-9): Low Risk  (11/23/2024)   Depression (PHQ2-9)    PHQ-2 Score: 0  Alcohol Screen: Low Risk (07/24/2023)   Alcohol Screen    Last Alcohol Screening Score (AUDIT): 0  Housing: Patient Declined (02/29/2024)   Housing Stability Vital Sign    Unable to Pay for Housing in the Last Year: Patient declined    Number of Times Moved in the Last Year: 0    Homeless in the Last Year: Patient declined  Utilities: Not At Risk (02/29/2024)   AHC Utilities    Threatened with loss of utilities: No  Health Literacy: Adequate Health Literacy (07/31/2023)   B1300 Health Literacy    Frequency of need for help with medical instructions: Never       Review of Systems denies fever,HA,CP,dyspnea, cough, abd/back pain,N/V or bleeding  Vital Signs:pending    Advance Care Plan: no documents on file  Physical Exam: awake/alert; chest- CTA bilat; heart- RRR; abd-soft,+BS,NT; no LE edema  Imaging: No results found.  Labs:  CBC: Recent Labs    11/09/24 1007 11/23/24 1118 12/07/24 1000 12/21/24 0826  WBC 6.3 7.1 4.7 4.6  HGB 11.2* 11.4* 11.7* 11.0*  HCT 33.1* 33.8* 34.9* 32.2*  PLT 179 132* 215 122*    COAGS: Recent Labs    03/01/24 0531  INR 1.3*  APTT 27    BMP: Recent Labs    09/15/24 0953 10/12/24 0837 11/09/24 1007 12/07/24 1000  NA 141 139 138 139  K 3.9 3.8 3.6 3.8  CL 108 107 103 105  CO2 28 26 24 23   GLUCOSE 149* 174* 154* 107*  BUN 47* 46* 44* 38*  CALCIUM  9.0 8.8* 8.9 9.3  CREATININE 1.38* 1.38* 1.32* 1.30*  GFRNONAA 40* 40* 42* 42*    LIVER FUNCTION TESTS: Recent Labs    09/15/24 0953 10/12/24 0837 11/09/24 1007 12/07/24 1000  BILITOT 0.4 0.4 0.3 0.3  AST 23 17 20 20   ALT 29 17 18 18   ALKPHOS 57 59 65 72  PROT 6.1* 5.8* 5.9* 6.3*  ALBUMIN 3.9 3.7 3.9 4.1    TUMOR MARKERS: No results for input(s): AFPTM, CEA, CA199, CHROMGRNA in the last 8760 hours.  Assessment and Plan: 79 y.o. female with PMH sig for anemia, CKD, DM, heart aneurysm, HTN, HSV,  hypothyroidism, MVP and multiple myeloma. She is scheduled today for image guided bone marrow biopsy to assess response to treatment.Risks and benefits of procedure was discussed with the patient  including, but not limited to bleeding, infection, damage to adjacent structures or low yield requiring additional tests.  All of the questions were answered and there is agreement to proceed.  Consent signed and in chart.  Known to IR team from prior BM bx on 02/01/24.    Thank you for allowing our service to participate in Cynthia Burns 's care.  Electronically Signed: D. Franky Rakers, PA-C   12/26/2024, 9:19 AM      I spent a total of  20 minutes   in face to face in clinical consultation, greater than 50% of which was counseling/coordinating care for image guided bone marrow biopsy   "

## 2024-12-27 ENCOUNTER — Ambulatory Visit (HOSPITAL_COMMUNITY)
Admission: RE | Admit: 2024-12-27 | Discharge: 2024-12-27 | Disposition: A | Discharge status: Home / Self Care | Source: Ambulatory Visit | Attending: Hematology | Admitting: Hematology

## 2024-12-27 ENCOUNTER — Other Ambulatory Visit: Payer: Self-pay

## 2024-12-27 ENCOUNTER — Encounter (HOSPITAL_COMMUNITY): Payer: Self-pay

## 2024-12-27 ENCOUNTER — Ambulatory Visit (HOSPITAL_COMMUNITY)
Admission: RE | Admit: 2024-12-27 | Discharge: 2024-12-27 | Disposition: A | Source: Ambulatory Visit | Attending: Hematology

## 2024-12-27 DIAGNOSIS — D169 Benign neoplasm of bone and articular cartilage, unspecified: Secondary | ICD-10-CM | POA: Insufficient documentation

## 2024-12-27 DIAGNOSIS — Z1379 Encounter for other screening for genetic and chromosomal anomalies: Secondary | ICD-10-CM | POA: Diagnosis not present

## 2024-12-27 DIAGNOSIS — C9000 Multiple myeloma not having achieved remission: Secondary | ICD-10-CM | POA: Insufficient documentation

## 2024-12-27 LAB — CBC WITH DIFFERENTIAL/PLATELET
Abs Immature Granulocytes: 0.04 K/uL (ref 0.00–0.07)
Basophils Absolute: 0.1 K/uL (ref 0.0–0.1)
Basophils Relative: 1 %
Eosinophils Absolute: 0.3 K/uL (ref 0.0–0.5)
Eosinophils Relative: 6 %
HCT: 36.4 % (ref 36.0–46.0)
Hemoglobin: 12.4 g/dL (ref 12.0–15.0)
Immature Granulocytes: 1 %
Lymphocytes Relative: 15 %
Lymphs Abs: 0.8 K/uL (ref 0.7–4.0)
MCH: 30.8 pg (ref 26.0–34.0)
MCHC: 34.1 g/dL (ref 30.0–36.0)
MCV: 90.5 fL (ref 80.0–100.0)
Monocytes Absolute: 0.9 K/uL (ref 0.1–1.0)
Monocytes Relative: 16 %
Neutro Abs: 3.5 K/uL (ref 1.7–7.7)
Neutrophils Relative %: 61 %
Platelets: 163 K/uL (ref 150–400)
RBC: 4.02 MIL/uL (ref 3.87–5.11)
RDW: 14.1 % (ref 11.5–15.5)
WBC: 5.7 K/uL (ref 4.0–10.5)
nRBC: 0 % (ref 0.0–0.2)

## 2024-12-27 LAB — GLUCOSE, CAPILLARY: Glucose-Capillary: 146 mg/dL — ABNORMAL HIGH (ref 70–99)

## 2024-12-27 MED ORDER — MIDAZOLAM HCL 2 MG/2ML IJ SOLN
INTRAMUSCULAR | Status: AC
  Filled 2024-12-27: qty 2

## 2024-12-27 MED ORDER — FENTANYL CITRATE (PF) 100 MCG/2ML IJ SOLN
INTRAMUSCULAR | Status: AC | PRN
  Administered 2024-12-27 (×2): 50 ug via INTRAVENOUS

## 2024-12-27 MED ORDER — MIDAZOLAM HCL (PF) 2 MG/2ML IJ SOLN
INTRAMUSCULAR | Status: AC | PRN
  Administered 2024-12-27 (×2): 1 mg via INTRAVENOUS

## 2024-12-27 MED ORDER — FENTANYL CITRATE (PF) 100 MCG/2ML IJ SOLN
INTRAMUSCULAR | Status: AC
  Filled 2024-12-27: qty 2

## 2024-12-27 MED ORDER — SODIUM CHLORIDE 0.9 % IV SOLN: INTRAVENOUS | Status: DC

## 2024-12-27 NOTE — Procedures (Signed)
 Vascular and Interventional Radiology Procedure Note  Patient: Cynthia Burns DOB: [birth year] Medical Record Number: [id number] Note Date/Time: 12/27/2024 11:02 AM   Performing Physician: Thom Hall, MD Assistant(s): None  Diagnosis: Myeloma, restaging   Procedure: BONE MARROW ASPIRATION and BIOPSY  Anesthesia: Conscious Sedation Complications: None Estimated Blood Loss: Minimal Specimens: Sent for Pathology  Findings:  Successful CT-guided bone marrow aspiration and biopsy A total of 1 cores were obtained. Hemostasis of the tract was achieved using Manual Pressure.  Plan: Bed rest for 1 hours.  See detailed procedure note with images in PACS. The patient tolerated the procedure well without incident or complication and was returned to Recovery in stable condition.    Thom Hall, MD [facility]   Pager. [phone] Clinic. [phone]

## 2024-12-27 NOTE — Discharge Instructions (Addendum)
Discharge Instructions:   Please call Interventional Radiology clinic [phone] with any questions or concerns.  You may remove your dressing and shower tomorrow.    Bone Marrow Aspiration and Bone Marrow Biopsy, Adult, Care After This sheet gives you information about how to care for yourself after your procedure. Your health care provider may also give you more specific instructions. If you have problems or questions, contact your health care provider. What can I expect after the procedure? After the procedure, it is common to have: Mild pain and tenderness. Swelling. Bruising. Follow these instructions at home: Puncture site care  Follow instructions from your health care provider about how to take care of the puncture site. Make sure you: Wash your hands with soap and water before and after you change your bandage (dressing). If soap and water are not available, use hand sanitizer. Change your dressing as told by your health care provider. Check your puncture site every day for signs of infection. Check for: More redness, swelling, or pain. Fluid or blood. Warmth. Pus or a bad smell. Activity Return to your normal activities as told by your health care provider. Ask your health care provider what activities are safe for you. Do not lift anything that is heavier than 10 lb (4.5 kg), or the limit that you are told, until your health care provider says that it is safe. Do not drive for 24 hours if you were given a sedative during your procedure. General instructions  Take over-the-counter and prescription medicines only as told by your health care provider. Do not take baths, swim, or use a hot tub until your health care provider approves. Ask your health care provider if you may take showers. You may only be allowed to take sponge baths. If directed, put ice on the affected area. To do this: Put ice in a plastic bag. Place a towel between your skin and the bag. Leave the ice  on for 20 minutes, 2-3 times a day. Keep all follow-up visits as told by your health care provider. This is important. Contact a health care provider if: Your pain is not controlled with medicine. You have a fever. You have more redness, swelling, or pain around the puncture site. You have fluid or blood coming from the puncture site. Your puncture site feels warm to the touch. You have pus or a bad smell coming from the puncture site. Summary After the procedure, it is common to have mild pain, tenderness, swelling, and bruising. Follow instructions from your health care provider about how to take care of the puncture site and what activities are safe for you. Take over-the-counter and prescription medicines only as told by your health care provider. Contact a health care provider if you have any signs of infection, such as fluid or blood coming from the puncture site. This information is not intended to replace advice given to you by your health care provider. Make sure you discuss any questions you have with your health care provider. Document Revised: 04/26/2019 Document Reviewed: 04/26/2019 Elsevier Patient Education  2023 Elsevier Inc.   Moderate Conscious Sedation, Adult, Care After This sheet gives you information about how to care for yourself after your procedure. Your health care provider may also give you more specific instructions. If you have problems or questions, contact your health care provider. What can I expect after the procedure? After the procedure, it is common to have: Sleepiness for several hours. Impaired judgment for several hours. Difficulty with balance. Vomiting if   you eat too soon. Follow these instructions at home: For the time period you were told by your health care provider: Rest. Do not participate in activities where you could fall or become injured. Do not drive or use machinery. Do not drink alcohol. Do not take sleeping pills or medicines that  cause drowsiness. Do not make important decisions or sign legal documents. Do not take care of children on your own. Eating and drinking  Follow the diet recommended by your health care provider. Drink enough fluid to keep your urine pale yellow. If you vomit: Drink water, juice, or soup when you can drink without vomiting. Make sure you have little or no nausea before eating solid foods. General instructions Take over-the-counter and prescription medicines only as told by your health care provider. Have a responsible adult stay with you for the time you are told. It is important to have someone help care for you until you are awake and alert. Do not smoke. Keep all follow-up visits as told by your health care provider. This is important. Contact a health care provider if: You are still sleepy or having trouble with balance after 24 hours. You feel light-headed. You keep feeling nauseous or you keep vomiting. You develop a rash. You have a fever. You have redness or swelling around the IV site. Get help right away if: You have trouble breathing. You have new-onset confusion at home. Summary After the procedure, it is common to feel sleepy, have impaired judgment, or feel nauseous if you eat too soon. Rest after you get home. Know the things you should not do after the procedure. Follow the diet recommended by your health care provider and drink enough fluid to keep your urine pale yellow. Get help right away if you have trouble breathing or new-onset confusion at home. This information is not intended to replace advice given to you by your health care provider. Make sure you discuss any questions you have with your health care provider. Document Revised: 04/06/2020 Document Reviewed: 11/03/2019 Elsevier Patient Education  2023 Elsevier Inc.  

## 2024-12-27 NOTE — Progress Notes (Signed)
 1235 Ice bag given for comfort to use to low back as instructed.

## 2024-12-29 LAB — SURGICAL PATHOLOGY

## 2025-01-02 NOTE — Telephone Encounter (Signed)
 PT is returning call to discuss the reasoning for the FU EGD. Please advise.

## 2025-01-04 ENCOUNTER — Inpatient Hospital Stay: Attending: Hematology | Admitting: Hematology

## 2025-01-04 ENCOUNTER — Inpatient Hospital Stay

## 2025-01-04 ENCOUNTER — Telehealth: Payer: Self-pay | Admitting: Hematology

## 2025-01-04 VITALS — BP 147/59 | HR 59 | Temp 97.2°F | Resp 18 | Wt 128.5 lb

## 2025-01-04 DIAGNOSIS — Z5112 Encounter for antineoplastic immunotherapy: Secondary | ICD-10-CM | POA: Insufficient documentation

## 2025-01-04 DIAGNOSIS — Z5111 Encounter for antineoplastic chemotherapy: Secondary | ICD-10-CM | POA: Diagnosis not present

## 2025-01-04 DIAGNOSIS — Z79620 Long term (current) use of immunosuppressive biologic: Secondary | ICD-10-CM | POA: Insufficient documentation

## 2025-01-04 DIAGNOSIS — C9000 Multiple myeloma not having achieved remission: Secondary | ICD-10-CM | POA: Insufficient documentation

## 2025-01-04 LAB — CBC WITH DIFFERENTIAL (CANCER CENTER ONLY)
Abs Immature Granulocytes: 0.01 K/uL (ref 0.00–0.07)
Basophils Absolute: 0.1 K/uL (ref 0.0–0.1)
Basophils Relative: 2 %
Eosinophils Absolute: 0.2 K/uL (ref 0.0–0.5)
Eosinophils Relative: 3 %
HCT: 35.1 % — ABNORMAL LOW (ref 36.0–46.0)
Hemoglobin: 11.9 g/dL — ABNORMAL LOW (ref 12.0–15.0)
Immature Granulocytes: 0 %
Lymphocytes Relative: 15 %
Lymphs Abs: 0.8 K/uL (ref 0.7–4.0)
MCH: 30.4 pg (ref 26.0–34.0)
MCHC: 33.9 g/dL (ref 30.0–36.0)
MCV: 89.8 fL (ref 80.0–100.0)
Monocytes Absolute: 0.7 K/uL (ref 0.1–1.0)
Monocytes Relative: 13 %
Neutro Abs: 3.3 K/uL (ref 1.7–7.7)
Neutrophils Relative %: 67 %
Platelet Count: 189 K/uL (ref 150–400)
RBC: 3.91 MIL/uL (ref 3.87–5.11)
RDW: 14.4 % (ref 11.5–15.5)
WBC Count: 5 K/uL (ref 4.0–10.5)
nRBC: 0 % (ref 0.0–0.2)

## 2025-01-04 LAB — COMPREHENSIVE METABOLIC PANEL WITH GFR
ALT: 16 U/L (ref 0–44)
AST: 20 U/L (ref 15–41)
Albumin: 4 g/dL (ref 3.5–5.0)
Alkaline Phosphatase: 68 U/L (ref 38–126)
Anion gap: 12 (ref 5–15)
BUN: 38 mg/dL — ABNORMAL HIGH (ref 8–23)
CO2: 25 mmol/L (ref 22–32)
Calcium: 9.4 mg/dL (ref 8.9–10.3)
Chloride: 103 mmol/L (ref 98–111)
Creatinine, Ser: 1.35 mg/dL — ABNORMAL HIGH (ref 0.44–1.00)
GFR, Estimated: 40 mL/min — ABNORMAL LOW
Glucose, Bld: 169 mg/dL — ABNORMAL HIGH (ref 70–99)
Potassium: 4.4 mmol/L (ref 3.5–5.1)
Sodium: 140 mmol/L (ref 135–145)
Total Bilirubin: 0.3 mg/dL (ref 0.0–1.2)
Total Protein: 6.1 g/dL — ABNORMAL LOW (ref 6.5–8.1)

## 2025-01-04 MED ORDER — DARATUMUMAB-HYALURONIDASE-FIHJ 1800-30000 MG-UT/15ML ~~LOC~~ SOLN
1800.0000 mg | Freq: Once | SUBCUTANEOUS | Status: AC
  Administered 2025-01-04: 1800 mg via SUBCUTANEOUS
  Filled 2025-01-04: qty 15

## 2025-01-04 MED ORDER — ACETAMINOPHEN 325 MG PO TABS
650.0000 mg | ORAL_TABLET | Freq: Once | ORAL | Status: AC
  Administered 2025-01-04: 650 mg via ORAL
  Filled 2025-01-04: qty 2

## 2025-01-04 MED ORDER — DIPHENHYDRAMINE HCL 25 MG PO CAPS
25.0000 mg | ORAL_CAPSULE | Freq: Once | ORAL | Status: AC
  Administered 2025-01-04: 25 mg via ORAL
  Filled 2025-01-04: qty 1

## 2025-01-04 MED ORDER — DEXAMETHASONE 6 MG PO TABS
12.0000 mg | ORAL_TABLET | Freq: Once | ORAL | Status: AC
  Administered 2025-01-04: 12 mg via ORAL
  Filled 2025-01-04: qty 2

## 2025-01-04 NOTE — Progress Notes (Unsigned)
 [name], female   DOB: 13-May-1946, 79 y.o.   MRN: [id number]     Cynthia Burns returns today for followup. I have followed her for hypertension, a sinus of Valsalva aneurysm, She is on Glucophage  and Rybelsus  for DM and edema better with control, weight loss and lasix    2014 had cardiac CTA with calcium  score 0 normal left dominant arteries Normal aortic root  2.7 cm and non coronary sinus 3.5 cm and 3.7 cm from coronal view Updated calcium  score done 06/09/23 still below average for age/sex at 22.6   Has one daughter in [place] Husband Dan a patient of mine as well with poor health  Seen by Dr Santo as DOD for HTN on 12/14/23 She has been started on thyroid  meds. She is anemic with some renal insufficiency Losartan  increased to 100 mg and started on hydralazine  He changed her to valsartan  320 mg and made her hydralazine  50 mg tid to consider adding aldactone Started on Farxiga  for renal protection    LDL 24 TSH 1.14 A1c 6.2 BUN 33 Cr 1.67 K 4.1 labs 12/08/23   Hct as low as 20.8 11/26/23 and transfused with last Hct 12/03/23 improved 31.4   Lots of non cardiac questions regarding her anemia and renal function. She has appropriate f/u for these with renal duplex, nephrology consult, GI consult, EGD and colonoscopy EGD 01/29/24 with esophagitis and gastritis. Colon with small hemorrhoids and diverticula She has been diagnosed with Myeloma with M spike and Kappa light chains has lost a lot of weight   ***  ROS: Denies fever, malais, weight loss, blurry vision, decreased visual acuity, cough, sputum, SOB, hemoptysis, pleuritic pain, palpitaitons, heartburn, abdominal pain, melena, lower extremity edema, claudication, or rash.  All other systems reviewed and negative  General: There were no vitals taken for this visit. Affect appropriate Healthy:  appears stated age HEENT: normal Neck supple with no adenopathy JVP normal no bruits no thyromegaly Lungs clear with no wheezing and  good diaphragmatic motion Heart:  S1/S2 no murmur, no rub, gallop or click PMI normal Abdomen: benighn, post lap choly  Distal pulses intact with no bruits No edema Neuro non-focal Skin warm and dry No muscular weakness  TTE done 03/01/24 reviewed  EF 60-65% trivial AR tri leaflet AV aortic root 3.9     Current Outpatient Medications  Medication Sig Dispense Refill   acyclovir  (ZOVIRAX ) 400 MG tablet TAKE 1 TABLET BY MOUTH TWICE A DAY 180 tablet 1   amoxicillin  (AMOXIL ) 500 MG capsule Take 2,000 mg by mouth See admin instructions. Take 2,000 mg by mouth one hour prior to dental visits     aspirin  EC 81 MG tablet Take 81 mg by mouth daily. Swallow whole.     Biotin 5000 MCG CAPS Take 5,000 mcg by mouth daily.     Blood Glucose Monitoring Suppl (ONE TOUCH ULTRA 2) w/Device KIT CHECK BLOOD SUGAR TWICE DAILY.  DX CODE  E11.9 1 kit 0   Cholecalciferol (VITAMIN D3) 1000 units CAPS Take 1,000 Units by mouth daily.     CRESTOR  20 MG tablet TAKE ONE TABLET BY MOUTH EVERY DAY 90 tablet 0   Cyanocobalamin  (VITAMIN B-12) 3000 MCG SUBL Place 3,000 mcg under the tongue daily in the afternoon.     DROPLET PEN NEEDLES 31G X 5 MM MISC USE ONE NEEDLE SUBCUTANEOUSLY AS DIRECTED USE WITH NOVOLOG  (REMOVE AND DISCARD NEEDLE IN SHARPS CONTAINER IMMEDIATELY AFTER USE) 100 each 2   fexofenadine (ALLEGRA) 180 MG  tablet Take 180 mg by mouth daily.     glucose blood (ONETOUCH ULTRA TEST) test strip USE ONE STRIP TO TEST AS DIRECTED - (KEEP UNUSED STRIPS IN ORIGINAL SEALED CONTAINER BETWEEN USES) 200 each 3   [Paused] hydrALAZINE  (APRESOLINE ) 25 MG tablet Take 25 mg by mouth See admin instructions. Take 25 mg by mouth at 8 AM, 3 PM, and 11 PM- in conjunction with one 50 mg tablet to equal a total dose of 75 mg (Patient not taking: Reported on 12/07/2024)     insulin  aspart (NOVOLOG  FLEXPEN) 100 UNIT/ML FlexPen Per sliding scale max 40 u daily 15 mL 3   Lancets (ONETOUCH DELICA PLUS LANCET33G) MISC USE 1 LANCET TO  TEST AS DIRECTED (DISCARD LANCET IN SHARPS CONTAINER IMMEDIATELY AFTER USE) 200 each 3   lenalidomide  (REVLIMID ) 15 MG capsule Take 1 capsule (15 mg total) by mouth daily. Take 1 capsule (15 mg total) by mouth daily for 21 days and then take 7 days off. 21 capsule 0   levothyroxine  (SYNTHROID ) 25 MCG tablet TAKE ONE TABLET BY MOUTH EVERY DAY BEFORE BREAKFAST 90 tablet 0   Multiple Vitamins-Minerals (PRESERVISION AREDS 2) CAPS Take 1 capsule by mouth 2 (two) times daily with a meal.     ondansetron  (ZOFRAN ) 8 MG tablet Take 8 mg by mouth 30 to 60 min prior to Cyclophosphamide administration then take 8 mg every 8 hrs as needed for nausea and vomiting. 30 tablet 1   pantoprazole  (PROTONIX ) 40 MG tablet Pantoprazole  40 mg before breakfast and dinner for 90 days with 3 refills (Patient taking differently: Take 40 mg by mouth See admin instructions. Take 40 mg by mouth 30 minutes before lunch and supper) 180 tablet 3   prochlorperazine  (COMPAZINE ) 10 MG tablet Take 1 tablet (10 mg total) by mouth every 6 (six) hours as needed for nausea or vomiting. 30 tablet 1   RYBELSUS  7 MG TABS TAKE ONE TABLET BY MOUTH EVERY DAY - (TAKE WITH NO MORE THAN 4 OUNCES OF PLAIN WATER AT LEAST 30 MINUTES BEFORE THE FIRST FOOD, BEVERAGE, OR OTHER MEDICATIONS OF THE DAY) 90 tablet 0   SYSTANE ULTRA PF 0.4-0.3 % SOLN Place 1 drop into both eyes See admin instructions. Instill 1 drop into both eyes one to four times a day     No current facility-administered medications for this visit.   Facility-Administered Medications Ordered in Other Visits  Medication Dose Route Frequency Provider Last Rate Last Admin   daratumumab -hyaluronidase -fihj (DARZALEX  FASPRO) 1800-30000 MG-UT/15ML chemo SQ injection 1,800 mg  1,800 mg Subcutaneous Once Kale, Gautam Kishore, MD        Allergies  Mucinex [guaifenesin er], Bystolic  [nebivolol  hcl], Calcium -containing compounds, Codeine, Paxlovid  [nirmatrelvir -ritonavir ], Advicor [niacin-lovastatin  er], Amlodipine, Lisinopril-hydrochlorothiazide, Other, Ramipril, and Sulfonamide derivatives  Electrocardiogram:    01/04/2025 NSR rate 69 normal 8 nonspecific ST changes  Assessment and Plan 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  Continue current medications. A1c 6.3  Obesity: Exercise and low carb diet discussed Chol: on statin LDL 29 02/23/23 at goal Discussed utility of calcium  score HTN:  multiple allergies Continue hydralazine   Edema: stable   GB: post lap choly with improved symptoms  Hypothyroid:  on synthroid  replacement TSH 1.14 12/03/23  A/CRF:  Cr 1.35 F/U primary on Farxiga . Renal duplex negative 01/08/24  Anemia:  related to above post transfusion Hct improved 31.4   CAD:  subclinical calcium  score 22.6 still  below average for age On statin with LDL at goal  Myeloma:  BM with 75% plasma cells DRd regimen Hct 33.1 PLT 179 and WBC 6.3 with Cr 1.3 Repeat BM biopsy done 12/28/23 to see response to Rx   F/U in a year   Cynthia Burns

## 2025-01-04 NOTE — Patient Instructions (Signed)
 [facility] - A DEPT OF [facility]  Discharge Instructions: Thank you for choosing [facility] to provide your oncology and hematology care.   If you have a lab appointment with the Cancer Center, please go directly to the Cancer Center and check in at the registration area.   Wear comfortable clothing and clothing appropriate for easy access to any Portacath or PICC line.   We strive to give you quality time with your provider. You may need to reschedule your appointment if you arrive late (15 or more minutes).  Arriving late affects you and other patients whose appointments are after yours.  Also, if you miss three or more appointments without notifying the office, you may be dismissed from the clinic at the provider's discretion.      For prescription refill requests, have your pharmacy contact our office and allow 72 hours for refills to be completed.    Today you received the following chemotherapy and/or immunotherapy agents: Daratumumab -hyaluronidase -fihj (Darzalex  faspro)    To help prevent nausea and vomiting after your treatment, we encourage you to take your nausea medication as directed.  BELOW ARE SYMPTOMS THAT SHOULD BE REPORTED IMMEDIATELY: *FEVER GREATER THAN 100.4 F (38 C) OR HIGHER *CHILLS OR SWEATING *NAUSEA AND VOMITING THAT IS NOT CONTROLLED WITH YOUR NAUSEA MEDICATION *UNUSUAL SHORTNESS OF BREATH *UNUSUAL BRUISING OR BLEEDING *URINARY PROBLEMS (pain or burning when urinating, or frequent urination) *BOWEL PROBLEMS (unusual diarrhea, constipation, pain near the anus) TENDERNESS IN MOUTH AND THROAT WITH OR WITHOUT PRESENCE OF ULCERS (sore throat, sores in mouth, or a toothache) UNUSUAL RASH, SWELLING OR PAIN  UNUSUAL VAGINAL DISCHARGE OR ITCHING   Items with * indicate a potential emergency and should be followed up as soon as possible or go to the Emergency Department if any problems should occur.  Please show the  CHEMOTHERAPY ALERT CARD or IMMUNOTHERAPY ALERT CARD at check-in to the Emergency Department and triage nurse.  Should you have questions after your visit or need to cancel or reschedule your appointment, please contact [facility] - A DEPT OF [facility]  Dept: [phone]  and follow the prompts.  Office hours are 8:00 a.m. to 4:30 p.m. Monday - Friday. Please note that voicemails left after 4:00 p.m. may not be returned until the following business day.  We are closed weekends and major holidays. You have access to a nurse at all times for urgent questions. Please call the main number to the clinic Dept: [phone] and follow the prompts.   For any non-urgent questions, you may also contact your provider using MyChart. We now offer e-Visits for anyone 46 and older to request care online for non-urgent symptoms. For details visit [email or web address].   Also download the MyChart app! Go to the app store, search MyChart, open the app, select [facility], and log in with your MyChart username and password.

## 2025-01-04 NOTE — Telephone Encounter (Signed)
 Scheduled patient for next appointment in 2 months. Called and spoke with the patients husband, he is aware of the appointments. He confirmed the appointment days and times over the phone.

## 2025-01-04 NOTE — Progress Notes (Incomplete)
 " HEMATOLOGY ONCOLOGY PROGRESS NOTE  Date of service: 01/04/2025  Patient Care Team: Antonio Meth, Jamee SAUNDERS, DO as PCP - General Delford Maude BROCKS, MD as PCP - Cardiology (Cardiology) Rosan Credit, MD as Consulting Physician (Ophthalmology) Obie Princella HERO, MD (Inactive) (Gastroenterology) Vernetta Lonni GRADE, MD as Consulting Physician (Orthopedic Surgery) Ivin Kocher, MD as Referring Physician (Dermatology) Onesimo Emaline Brink, MD as Consulting Physician (Hematology)  CHIEF COMPLAINT/PURPOSE OF CONSULTATION: Follow-up for continued evaluation and management of ***  HISTORY OF PRESENTING ILLNESS: (***) ***   SUMMARY OF ONCOLOGIC HISTORY: Oncology History  Multiple myeloma (HCC)  01/29/2024 Initial Diagnosis   Multiple myeloma (HCC)   02/23/2024 -  Chemotherapy   Patient is on Treatment Plan : MYELOMA DaraCyBorD (Daratumumab  SQ + Cyclophosphamide PO + Bortezomib  SQ + Dexamethasone  PO) q28d x 8 cycles / Daratumumab  SQ q28d     03/14/2024 Cancer Staging   Staging form: Plasma Cell Myeloma and Plasma Cell Disorders, AJCC 8th Edition - Clinical: High-risk cytogenetics: Absent - Signed by Onesimo Emaline Brink, MD on 03/14/2024 Stage prefix: Initial diagnosis Cytogenetics: t(11;14) translocation    {ELGKCycle/Day (Optional):34084}  INTERVAL HISTORY: Cynthia Burns is a 79 y.o. female who is here today for continued evaluation and management of ***. accompanied by daughter and husband  {ELGKLOV:33898}  Today, she says that she has been doing well.   - This is not curative, however, she has significantly improved from her initial presentation. We previously discussed a tranplant and she did not wish to proceed with that option.   Maintenance treatments discussed. I'm thinking of single agent maintenance with Revlimid . She is currently on 15 mg, and tolerating this without notable toxicities or side effects. Maintenance treatment can be ongoing for several years.    Reviewed risks: increased risk of infection, bone marrow suppression, increased risk of blood clots (is on ASA), some studies showed increased risk of secondary cancers (usually with previous risk factors as well).   After completing tx, then we have the option of discontinuing maintenance treatment or decreasing dose.   Discussed Minimal Residual Disease status.  - Reviewed infection precautions for crowds, family, and friends.  Translocation 11-14, standard risk  Provided health maintenance counseling: recommended staying updated with age-recommended vaccinations and cancer screenings.  Cont. Following with Dermatology for skin evaluations.   She has been receiving calls from GI regarding an EGD, however they wanted her to be cleared for this first.   - Discussed that she would be have to be off of ASA around that time.   Her husband notes that she is currently discussing with Urology regarding management of her prolapsed bladder.   - Discussed guidelines regarding Revlimid  in association with potential surgical procedures she may undergo.   - Finish Dara today  - Cont Revlimid  15 mg - Continue to monitor labs.  Last Bone DEXA Was put on Fosamax, developed jaw pain shortly after, discontinued  No ulcers She also has history of bruxism, but can't remember if this contributed to jaw pain.  - Recommend discussing with dentist  - She is officially in remission   She notes that her prolapse issue isn't too bothersome.  - Decreased Revlimid  to 10 mg at next cycle.   Denies any  []  new infection issues,  []  fevers/chills,  []  drenching night sweats,  []  unexpected weight change,  []  back pain,  []  chest pain,  []  headaches,  []  abdominal pain,  []  new bone pains,  []  new lumps/bumps,  []  leg swelling,  []   bleeding issues (nose bleeds, gum bleeds, abnormal/spontaneous bruising),  []  nausea/vomiting,  []  diarrhea,  []  constipation,  []  bowel/urinary changes, []  SOB,   []  change in breathing    REVIEW OF SYSTEMS:   10 Point review of systems of done and is negative except as noted above.  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   HSV-1 infection    HYPERTENSION    Hypothyroid    MITRAL VALVE PROLAPSE    OSTEOPENIA    Overweight(278.02)    PHARYNGITIS, ACUTE     IMMUNIZATION HISTORY Immunization History  Administered Date(s) Administered   Fluad Quad(high Dose 65+) 08/18/2019   Fluad Trivalent(High Dose 65+) 08/27/2023   INFLUENZA, HIGH DOSE SEASONAL PF 09/01/2016, 09/18/2018, 08/30/2020, 08/26/2024   Influenza Whole 10/20/2007   Influenza,inj,Quad PF,6+ Mos 09/05/2013, 09/05/2014, 09/13/2015   Influenza-Unspecified 09/21/2017, 09/23/2021, 09/25/2022   PFIZER(Purple Top)SARS-COV-2 Vaccination 02/13/2020, 03/05/2020, 11/06/2020   PNEUMOCOCCAL CONJUGATE-20 08/27/2023   Pfizer(Comirnaty)Fall Seasonal Vaccine 12 years and older 11/28/2024   Pneumococcal Conjugate-13 02/27/2014   Pneumococcal Polysaccharide-23 09/06/2011, 07/26/2019, 07/25/2020   Td 07/26/2019   Zoster Recombinant(Shingrix) 04/12/2017, 06/13/2017   Zoster, Live 09/14/2013    SURGICAL HISTORY Past Surgical History:  Procedure Laterality Date   ABDOMINAL HYSTERECTOMY     CHOLECYSTECTOMY     prolped bladder     WISDOM TOOTH EXTRACTION      SOCIAL HISTORY Social History[1]  Social History   Social History Narrative   Not on file    SOCIAL DRIVERS OF HEALTH SDOH Screenings   Food Insecurity: Patient Declined (02/29/2024)  Housing: Patient Declined (02/29/2024)  Transportation Needs: No Transportation Needs (02/29/2024)  Utilities: Not At Risk (02/29/2024)  Alcohol Screen: Low Risk  (07/24/2023)  Depression (PHQ2-9): Low Risk (11/23/2024)  Financial Resource Strain: Low Risk (12/07/2023)  Physical Activity: Insufficiently Active (12/07/2023)  Social Connections: Socially Integrated (02/29/2024)  Stress: No Stress Concern Present (12/07/2023)  Tobacco Use: Low Risk (12/27/2024)  Health Literacy: Adequate Health Literacy (07/31/2023)     FAMILY HISTORY Family History  Problem Relation Age of Onset   Hypertension Mother    Heart disease Mother        Had a stent   Emphysema Father    Diabetes Father    COPD Father    Stroke Sister    Breast cancer Neg Hx      ALLERGIES: is allergic to mucinex [guaifenesin er], bystolic  [nebivolol  hcl], calcium -containing compounds, codeine, paxlovid  [nirmatrelvir -ritonavir ], advicor [niacin-lovastatin er], amlodipine, lisinopril-hydrochlorothiazide, other, ramipril, and sulfonamide derivatives.  MEDICATIONS  Current Outpatient Medications  Medication Sig Dispense Refill   acyclovir  (ZOVIRAX ) 400 MG tablet TAKE 1 TABLET BY MOUTH TWICE A DAY 180 tablet 1   amoxicillin  (AMOXIL ) 500 MG capsule Take 2,000 mg by mouth See admin instructions. Take 2,000 mg by mouth one hour prior to dental visits     aspirin  EC 81 MG tablet Take 81 mg by mouth daily. Swallow whole.     Biotin 5000 MCG CAPS Take 5,000 mcg by mouth daily.     Blood Glucose Monitoring Suppl (ONE TOUCH ULTRA 2) w/Device KIT CHECK BLOOD SUGAR TWICE DAILY.  DX CODE  E11.9 1  kit 0   Cholecalciferol (VITAMIN D3) 1000 units CAPS Take 1,000 Units by mouth daily.     CRESTOR  20 MG tablet TAKE ONE TABLET BY MOUTH EVERY DAY 90 tablet 0   Cyanocobalamin  (VITAMIN B-12) 3000 MCG SUBL Place 3,000 mcg under the tongue daily in the afternoon.     dexamethasone  (DECADRON ) 4 MG tablet TAKE FIVE TABLETS BY MOUTH EVERY DAY WITH BREAKFAST THE DAY AFTER EVERY DARATUMUMAB  DOSE 30 tablet 1   DROPLET PEN NEEDLES 31G X 5 MM MISC USE ONE NEEDLE SUBCUTANEOUSLY AS DIRECTED USE WITH NOVOLOG  (REMOVE AND  DISCARD NEEDLE IN SHARPS CONTAINER IMMEDIATELY AFTER USE) 100 each 2   fexofenadine (ALLEGRA) 180 MG tablet Take 180 mg by mouth daily.     glucose blood (ONETOUCH ULTRA TEST) test strip USE ONE STRIP TO TEST AS DIRECTED - (KEEP UNUSED STRIPS IN ORIGINAL SEALED CONTAINER BETWEEN USES) 200 each 3   [Paused] hydrALAZINE  (APRESOLINE ) 25 MG tablet Take 25 mg by mouth See admin instructions. Take 25 mg by mouth at 8 AM, 3 PM, and 11 PM- in conjunction with one 50 mg tablet to equal a total dose of 75 mg (Patient not taking: Reported on 12/07/2024)     insulin  aspart (NOVOLOG  FLEXPEN) 100 UNIT/ML FlexPen Per sliding scale max 40 u daily 15 mL 3   Lancets (ONETOUCH DELICA PLUS LANCET33G) MISC USE 1 LANCET TO TEST AS DIRECTED (DISCARD LANCET IN SHARPS CONTAINER IMMEDIATELY AFTER USE) 200 each 3   lenalidomide  (REVLIMID ) 15 MG capsule Take 1 capsule (15 mg total) by mouth daily. Take 1 capsule (15 mg total) by mouth daily for 21 days and then take 7 days off. 21 capsule 0   levothyroxine  (SYNTHROID ) 25 MCG tablet TAKE ONE TABLET BY MOUTH EVERY DAY BEFORE BREAKFAST 90 tablet 0   Multiple Vitamins-Minerals (PRESERVISION AREDS 2) CAPS Take 1 capsule by mouth 2 (two) times daily with a meal.     ondansetron  (ZOFRAN ) 8 MG tablet Take 8 mg by mouth 30 to 60 min prior to Cyclophosphamide administration then take 8 mg every 8 hrs as needed for nausea and vomiting. 30 tablet 1   pantoprazole  (PROTONIX ) 40 MG tablet Pantoprazole  40 mg before breakfast and dinner for 90 days with 3 refills (Patient taking differently: Take 40 mg by mouth See admin instructions. Take 40 mg by mouth 30 minutes before lunch and supper) 180 tablet 3   prochlorperazine  (COMPAZINE ) 10 MG tablet Take 1 tablet (10 mg total) by mouth every 6 (six) hours as needed for nausea or vomiting. 30 tablet 1   RYBELSUS  7 MG TABS TAKE ONE TABLET BY MOUTH EVERY DAY - (TAKE WITH NO MORE THAN 4 OUNCES OF PLAIN WATER AT LEAST 30 MINUTES BEFORE THE FIRST FOOD,  BEVERAGE, OR OTHER MEDICATIONS OF THE DAY) 90 tablet 0   SYSTANE ULTRA PF 0.4-0.3 % SOLN Place 1 drop into both eyes See admin instructions. Instill 1 drop into both eyes one to four times a day     No current facility-administered medications for this visit.    PHYSICAL EXAMINATION: ECOG PERFORMANCE STATUS: {CHL ONC ECOG ED:8845999799} VITALS: Vitals:   01/04/25 1129  BP: (!) 149/54  Pulse: (!) 59  Resp: 18  Temp: (!) 97.2 F (36.2 C)  SpO2: 99%   Filed Weights   01/04/25 1129  Weight: 128 lb 8 oz (58.3 kg)   Body mass index is 24.28 kg/m.  GENERAL: alert, in no acute distress and comfortable SKIN: no acute rashes, no  significant lesions EYES: conjunctiva are pink and non-injected, sclera anicteric OROPHARYNX: MMM, no exudates, no oropharyngeal erythema or ulceration NECK: supple, no JVD LYMPH:  no palpable lymphadenopathy in the cervical, axillary or inguinal regions LUNGS: clear to auscultation b/l with normal respiratory effort HEART: regular rate & rhythm ABDOMEN:  normoactive bowel sounds , non tender, not distended, no hepatosplenomegaly Extremity: no pedal edema PSYCH: alert & oriented x 3 with fluent speech NEURO: no focal motor/sensory deficits  LABORATORY DATA:   I have reviewed the data as listed     Latest Ref Rng & Units 01/04/2025   10:34 AM 12/27/2024    9:30 AM 12/21/2024    8:26 AM  CBC EXTENDED  WBC 4.0 - 10.5 K/uL 5.0  5.7  4.6   RBC 3.87 - 5.11 MIL/uL 3.91  4.02  3.61   Hemoglobin 12.0 - 15.0 g/dL 88.0  87.5  88.9   HCT 36.0 - 46.0 % 35.1  36.4  32.2   Platelets 150 - 400 K/uL 189  163  122   NEUT# 1.7 - 7.7 K/uL 3.3  3.5  2.8   Lymph# 0.7 - 4.0 K/uL 0.8  0.8  0.6     - Discussed lab results on 01/04/2025 in detail with patient: CBC showed {ELGKCBC:33763} CMP with Creatinine *** {ELIncreased/Decreased:33631} from *** and Calcium  *** {ELIncreased/Decreased:33631} from ***.  {ELGKMyeloma&Lightchains (Optional):33764}  {ELAddOncLabs  (Optional):33736}     Latest Ref Rng & Units 01/04/2025   10:34 AM 12/07/2024   10:00 AM 11/09/2024   10:07 AM  CMP  Glucose 70 - 99 mg/dL 830  892  845   BUN 8 - 23 mg/dL 38  38  44   Creatinine 0.44 - 1.00 mg/dL 8.64  8.69  8.67   Sodium 135 - 145 mmol/L 140  139  138   Potassium 3.5 - 5.1 mmol/L 4.4  3.8  3.6   Chloride 98 - 111 mmol/L 103  105  103   CO2 22 - 32 mmol/L 25  23  24    Calcium  8.9 - 10.3 mg/dL 9.4  9.3  8.9   Total Protein 6.5 - 8.1 g/dL 6.1  6.3  5.9   Total Bilirubin 0.0 - 1.2 mg/dL 0.3  0.3  0.3   Alkaline Phos 38 - 126 U/L 68  72  65   AST 15 - 41 U/L 20  20  20    ALT 0 - 44 U/L 16  18  18      PREVIOUS STUDIES:  RADIOGRAPHIC STUDIES: I have personally reviewed the radiological images as listed and agreed with the findings in the report. CT BONE MARROW BIOPSY & ASPIRATION Result Date: 12/27/2024 INDICATION: History of multiple myeloma. Prior bone marrow biopsy 02/11/2024. Unilateral bone marrow aspiration and biopsy for evaluation of response to treatment EXAM: CT GUIDED BONE MARROW ASPIRATION AND CORE BIOPSY MEDICATIONS: None. ANESTHESIA/SEDATION: Moderate (conscious) sedation was employed during this procedure. A total of Versed  2 mg and Fentanyl  100 mcg was administered intravenously. Moderate Sedation Time: 10 minutes. The patient's level of consciousness and vital signs were monitored continuously by radiology nursing throughout the procedure under my direct supervision. FLUOROSCOPY TIME:  CT dose; 125 mGycm COMPLICATIONS: None immediate. Estimated blood loss: <5 mL PROCEDURE: RADIATION DOSE REDUCTION: This exam was performed according to the departmental dose-optimization program which includes automated exposure control, adjustment of the mA and/or kV according to patient size and/or use of iterative reconstruction technique. Informed written consent was obtained from the patient after a  thorough discussion of the procedural risks, benefits and alternatives. All  questions were addressed. Maximal Sterile Barrier Technique was utilized including caps, mask, sterile gowns, sterile gloves, sterile drape, hand hygiene and skin antiseptic. A timeout was performed prior to the initiation of the procedure. The patient was positioned prone and non-contrast localization CT was performed of the pelvis to demonstrate the iliac marrow spaces. Under sterile conditions and local anesthesia, an 11 gauge coaxial bone biopsy needle was advanced into the RIGHT iliac marrow space. Needle position was confirmed with CT imaging. Initially, bone marrow aspiration was performed. Next, the 11 gauge outer cannula was utilized to obtain a single iliac bone marrow core biopsy. Needle was removed. Hemostasis was obtained with compression. The patient tolerated the procedure well. Samples were prepared with the cytotechnologist. IMPRESSION: Successful CT-guided bone marrow aspiration and biopsy. Thom Hall, MD [facility] Radiology Electronically Signed   By: Thom Hall M.D.   On: 12/27/2024 18:07   MM 3D SCREENING MAMMOGRAM BILATERAL BREAST Result Date: 11/01/2024 CLINICAL DATA:  Screening. EXAM: DIGITAL SCREENING BILATERAL MAMMOGRAM WITH TOMOSYNTHESIS AND CAD TECHNIQUE: Bilateral screening digital craniocaudal and mediolateral oblique mammograms were obtained. Bilateral screening digital breast tomosynthesis was performed. The images were evaluated with computer-aided detection. COMPARISON:  Previous exam(s). ACR Breast Density Category b: There are scattered areas of fibroglandular density. FINDINGS: There are no findings suspicious for malignancy. IMPRESSION: No mammographic evidence of malignancy. A result letter of this screening mammogram will be mailed directly to the patient. RECOMMENDATION: Screening mammogram in one year. (Code:SM-B-01Y) BI-RADS CATEGORY  1: Negative. Electronically Signed   By: Craig Farr M.D.   On: 11/01/2024 08:06     ASSESSMENT & PLAN:  79 y.o. female with    PLAN: - Discussed lab results on 01/04/2025 in detail with patient: CBC showed {ELGKCBC:33763} CMP with Creatinine *** {ELIncreased/Decreased:33631} from *** and Calcium  *** {ELIncreased/Decreased:33631} from ***.  {ELGKMyeloma&Lightchains (Optional):33764}  FOLLOW-UP in {WEEKS/MONTHS:30939} for labs and follow-up with Dr. Onesimo.  The total time spent in the appointment was *** minutes* .  All of the patient's questions were answered and the patient knows to call the clinic with any problems, questions, or concerns.  Emaline Onesimo MD MS AAHIVMS [facility] Gastroenterology Hematology/Oncology Physician [facility]  *Total Encounter Time as defined by the Centers for Medicare and Medicaid Services includes, in addition to the face-to-face time of a patient visit (documented in the note above) non-face-to-face time: obtaining and reviewing outside history, ordering and reviewing medications, tests or procedures, care coordination (communications with other health care professionals or caregivers) and documentation in the medical record.  I,Emily Lagle,acting as a neurosurgeon for Emaline Onesimo, MD.,have documented all relevant documentation on the behalf of Emaline Onesimo, MD,as directed by  Emaline Onesimo, MD while in the presence of Emaline Onesimo, MD.  I have reviewed the above documentation for accuracy and completeness, and I agree with the above.  Emaline Onesimo, MD    [1]  Social History Tobacco Use   Smoking status: Never   Smokeless tobacco: Never  Vaping Use   Vaping status: Never Used  Substance Use Topics   Alcohol use: No    Alcohol/week: 0.0 standard drinks of alcohol   Drug use: No   "

## 2025-01-05 ENCOUNTER — Encounter (HOSPITAL_COMMUNITY): Payer: Self-pay

## 2025-01-05 ENCOUNTER — Other Ambulatory Visit: Payer: Self-pay

## 2025-01-05 LAB — KAPPA/LAMBDA LIGHT CHAINS
Kappa free light chain: 3.3 mg/L (ref 3.3–19.4)
Kappa, lambda light chain ratio: 1.1 (ref 0.26–1.65)
Lambda free light chains: 3 mg/L — ABNORMAL LOW (ref 5.7–26.3)

## 2025-01-06 ENCOUNTER — Telehealth: Payer: Self-pay | Admitting: Hematology

## 2025-01-07 ENCOUNTER — Other Ambulatory Visit: Payer: Self-pay

## 2025-01-07 LAB — MULTIPLE MYELOMA PANEL, SERUM
Albumin SerPl Elph-Mcnc: 3.8 g/dL (ref 2.9–4.4)
Albumin/Glob SerPl: 2 — ABNORMAL HIGH (ref 0.7–1.7)
Alpha 1: 0.2 g/dL (ref 0.0–0.4)
Alpha2 Glob SerPl Elph-Mcnc: 0.7 g/dL (ref 0.4–1.0)
B-Globulin SerPl Elph-Mcnc: 0.8 g/dL (ref 0.7–1.3)
Gamma Glob SerPl Elph-Mcnc: 0.3 g/dL — ABNORMAL LOW (ref 0.4–1.8)
Globulin, Total: 2 g/dL — ABNORMAL LOW (ref 2.2–3.9)
IgA: 8 mg/dL — ABNORMAL LOW (ref 64–422)
IgG (Immunoglobin G), Serum: 377 mg/dL — ABNORMAL LOW (ref 586–1602)
IgM (Immunoglobulin M), Srm: 17 mg/dL — ABNORMAL LOW (ref 26–217)
M Protein SerPl Elph-Mcnc: 0.1 g/dL — ABNORMAL HIGH
Total Protein ELP: 5.8 g/dL — ABNORMAL LOW (ref 6.0–8.5)

## 2025-01-10 NOTE — Telephone Encounter (Addendum)
 Called and spoke with patient and patient's husband-conversation at length====patient is requesting to know if it is medically necessary for her to repeat the EGD (requested 2-3 months post EGD in 01/2024); if Dr. Shila feels that an OV is necessary to discuss this issue the patient is fine with that too; Patient reports she is eating bland foods/ no spicy foods, no symptoms of reflux (heartburn/acid reflux/belching/nausea/vomiting/swallowing difficulties/burping); Not having any gi symptoms;  Patient is taking Revlimid  (takes 21 days then off for 7 days)- EGD would have to be scheduled on one of the 7 off of med days per patient's understanding of this medication and oncologist recommendations;  Patient's husband and patient are requesting to know if EGD is medically necessary and if it is not then they want to wait until she can be off of the chemo medications/been in remission longer.  Please advise as patient and patient's husband would like a phone call back instead of a MyChart message being sent; Please/thank you Bre, PV RN

## 2025-01-10 NOTE — Progress Notes (Signed)
 FU with Dr Onesimo 01/04/25. Patient with BMB on 12/26/24 after 8 cycles chemotherapy, now Revlimid  for maintenance. This Navigator will sign off now.

## 2025-01-10 NOTE — Telephone Encounter (Signed)
 Inbound call from pt husband wanting to speak to the nurse. Patient has called 2 other times in regards to scheduling a follow up EGD and has not received a call back. Patient husband did advise that his wife is going through remission and her oncologist gave her the ok to get this procedure done. Patient husband is requesting for us  to call him back at [phone]. Please advise.

## 2025-01-10 NOTE — Progress Notes (Signed)
 " HEMATOLOGY ONCOLOGY PROGRESS NOTE  Date of service: 01/04/2025  Patient Care Team: Antonio Meth, Jamee SAUNDERS, DO as PCP - General Delford Maude BROCKS, MD as PCP - Cardiology (Cardiology) Rosan Credit, MD as Consulting Physician (Ophthalmology) Obie Princella HERO, MD (Inactive) (Gastroenterology) Vernetta Lonni GRADE, MD as Consulting Physician (Orthopedic Surgery) Ivin Kocher, MD as Referring Physician (Dermatology) Onesimo Emaline Brink, MD as Consulting Physician (Hematology)  CHIEF COMPLAINT/PURPOSE OF CONSULTATION: Follow-up for continued evaluation and management of Multiple Myeloma  HISTORY OF PRESENTING ILLNESS: (2/13/20215) Cynthia Burns is a wonderful 79 y.o. female who was scheduled to see Dr. Andriette as a new patient and was referred to the ED for evaluation and management of newly diagnosed myeloma with rapidly worsening hgb and renal insuffiencey with proteinuria. High suspicious of Multiple myeloma.    Outside labs done at Dr Ephriam Singh's office (available in referral under media) show M spike of 6.1g/dl and Kappa light chains in the 400's.   Patient is accompanied by her husband and her daughter at bed side during the visit. Patient notes she has been having significantly more fatigue. Has required PRBC transfusions for symptomatic anemia.   She complains of fatigue, right ankle pain, and unexpected weight loss of around 25-30 lbs due to appetite loss in the past year. Her daughter notes that the patient has been confused more often as well.    She denies any new infection issues, fever, chills, back pain, chest pain, abdominal pain, or leg swelling.    Patient notes she tested positive for COVID-19 infection in August 2022 and was prescribed Paxlovid . However, she did not tolerate the medication due to allergic reaction.   PmHx of hypertension. She has been taking Losartan  and hydralazine .   Surgery history: Hysterectomy and Cholecystectomy.    She was started  on dexamethasone  20 mg daily x 4 doses yesterday. She has been tolerating the high-dose steroid well.    Cynthia Burns is a 79 y.o. female here for evaluation and management of newly diagnosed myeloma with rapidly worsening hgb and renal insuffiencey with proteinuria.    Patient was seen by me in the ED on 01/30/2024 and complained of significantly worsened fatigue as well as right ankle pain, increased confusion, unexpected weight loss of about 25-30 pounds in 1 year due to appetite loss.   Today, she is accompanied by her husband and daughter. Her husband reports that patient has been feeling extremely weak since being discharged from the hospital. Patient reports no other new symptoms since her inpatient visit.    Patient's husband reports that patient was started on 0.5 MG Clonazepam  by Dr. Austria in the hospital for anxiety management. Her husband reports that Clonazepam  caused sedation, so it has been cut in half.    Patient's husband reports that her blood pressure generally fluctuates at home. Her blood pressure in clinic today is 161/74. She notes that she takes her blood pressure medication at lunch time. She reports one incident in which her blood pressure was over 200, though it has not been persistent at that level.   Patient reports that her baseline weight one year ago was 136 pounds. She did lose about 32 pounds recently, but she has gained back 12 pounds. Her current weight is 116 pounds.    She has not received the latest COVID-19 vaccine. Patient is otherwise UTD with her age-appropriate vaccines, including flu, RSV, shingles, and pneumonia.    She notes that she was infected with COVID-19 3x previously.  Patient does endorse some lightheadedness. She denies any back pain or abdominal pain.    Pt reports a hx of prolapsed bladder, which is not sympomatically bothersome.    Patient reports allergies to several medications.    She does stay regularly hydrated by consuming  nearly 3L of water daily.    Patient will have a kidney biopsy on 02/18/2024.  She will see her kidney doctor next on 02/24/2024.    Patient reports that her PCP has started her on Rybelsus  for DM management. She reports that her next visit with her PCP is 02/25/2024.   SUMMARY OF ONCOLOGIC HISTORY: Oncology History  Multiple myeloma (HCC)  01/29/2024 Initial Diagnosis   Multiple myeloma (HCC)   02/23/2024 -  Chemotherapy   Patient is on Treatment Plan : MYELOMA DaraCyBorD (Daratumumab  SQ + Cyclophosphamide PO + Bortezomib  SQ + Dexamethasone  PO) q28d x 8 cycles / Daratumumab  SQ q28d     03/14/2024 Cancer Staging   Staging form: Plasma Cell Myeloma and Plasma Cell Disorders, AJCC 8th Edition - Clinical: High-risk cytogenetics: Absent - Signed by Onesimo Emaline Brink, MD on 03/14/2024 Stage prefix: Initial diagnosis Cytogenetics: t(11;14) translocation     INTERVAL HISTORY: Cynthia Burns is a 79 y.o. female who is here today for continued evaluation and management of Multiple Myeloma. accompanied by daughter and husband  she was last seen by me on 12/07/2024; at the time she did not have any concerns and was doing well.    Today, she says that she has been doing well.   She has been receiving calls from GI regarding an EGD, however they wanted her to be cleared for this first.   Her husband notes that she is currently discussing with Urology regarding management of her prolapsed bladder, though she says that her prolapse issue isn't too bothersome at this moment.  She says that following her last bone density she was placed on Fosamax, but developed jaw pain shortly after, so discontinued the medication. She also has history of bruxism, but can't remember if this contributed to jaw pain.   REVIEW OF SYSTEMS:   10 Point review of systems of done and is negative except as noted above.  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   HSV-1 infection    HYPERTENSION    Hypothyroid    MITRAL VALVE PROLAPSE    OSTEOPENIA    Overweight(278.02)    PHARYNGITIS, ACUTE     IMMUNIZATION HISTORY Immunization History  Administered Date(s) Administered   Fluad Quad(high Dose 65+) 08/18/2019   Fluad Trivalent(High Dose 65+) 08/27/2023   INFLUENZA, HIGH DOSE SEASONAL PF 09/01/2016, 09/18/2018, 08/30/2020, 08/26/2024   Influenza Whole 10/20/2007   Influenza,inj,Quad PF,6+ Mos 09/05/2013, 09/05/2014, 09/13/2015   Influenza-Unspecified 09/21/2017, 09/23/2021, 09/25/2022   PFIZER(Purple Top)SARS-COV-2 Vaccination 02/13/2020, 03/05/2020, 11/06/2020   PNEUMOCOCCAL CONJUGATE-20 08/27/2023   Pfizer(Comirnaty)Fall Seasonal Vaccine 12 years and older 11/28/2024   Pneumococcal Conjugate-13 02/27/2014   Pneumococcal Polysaccharide-23 09/06/2011, 07/26/2019, 07/25/2020   Td 07/26/2019   Zoster Recombinant(Shingrix) 04/12/2017, 06/13/2017   Zoster, Live 09/14/2013    SURGICAL HISTORY Past Surgical History:  Procedure Laterality Date   ABDOMINAL HYSTERECTOMY  CHOLECYSTECTOMY     prolped bladder     WISDOM TOOTH EXTRACTION      SOCIAL HISTORY Social History[1]  Social History   Social History Narrative   Not on file    SOCIAL DRIVERS OF HEALTH SDOH Screenings   Food Insecurity: Patient Declined (02/29/2024)  Housing: Patient Declined (02/29/2024)  Transportation Needs: No Transportation Needs (02/29/2024)  Utilities: Not At Risk (02/29/2024)  Alcohol Screen: Low Risk (07/24/2023)  Depression (PHQ2-9): Low Risk (01/04/2025)  Financial Resource Strain: Low Risk (12/07/2023)  Physical Activity: Insufficiently Active (12/07/2023)  Social Connections: Socially Integrated (02/29/2024)  Stress: No Stress  Concern Present (12/07/2023)  Tobacco Use: Low Risk (12/27/2024)  Health Literacy: Adequate Health Literacy (07/31/2023)     FAMILY HISTORY Family History  Problem Relation Age of Onset   Hypertension Mother    Heart disease Mother        Had a stent   Emphysema Father    Diabetes Father    COPD Father    Stroke Sister    Breast cancer Neg Hx      ALLERGIES: is allergic to mucinex [guaifenesin er], bystolic  [nebivolol  hcl], calcium -containing compounds, codeine, paxlovid  [nirmatrelvir -ritonavir ], advicor [niacin-lovastatin er], amlodipine, lisinopril-hydrochlorothiazide, other, ramipril, and sulfonamide derivatives.  MEDICATIONS  Current Outpatient Medications  Medication Sig Dispense Refill   acyclovir  (ZOVIRAX ) 400 MG tablet TAKE 1 TABLET BY MOUTH TWICE A DAY 180 tablet 1   amoxicillin  (AMOXIL ) 500 MG capsule Take 2,000 mg by mouth See admin instructions. Take 2,000 mg by mouth one hour prior to dental visits     aspirin  EC 81 MG tablet Take 81 mg by mouth daily. Swallow whole.     Biotin 5000 MCG CAPS Take 5,000 mcg by mouth daily.     Blood Glucose Monitoring Suppl (ONE TOUCH ULTRA 2) w/Device KIT CHECK BLOOD SUGAR TWICE DAILY.  DX CODE  E11.9 1 kit 0   Cholecalciferol (VITAMIN D3) 1000 units CAPS Take 1,000 Units by mouth daily.     CRESTOR  20 MG tablet TAKE ONE TABLET BY MOUTH EVERY DAY 90 tablet 0   Cyanocobalamin  (VITAMIN B-12) 3000 MCG SUBL Place 3,000 mcg under the tongue daily in the afternoon.     fexofenadine (ALLEGRA) 180 MG tablet Take 180 mg by mouth daily.     glucose blood (ONETOUCH ULTRA TEST) test strip USE ONE STRIP TO TEST AS DIRECTED - (KEEP UNUSED STRIPS IN ORIGINAL SEALED CONTAINER BETWEEN USES) 200 each 3   insulin  aspart (NOVOLOG  FLEXPEN) 100 UNIT/ML FlexPen Per sliding scale max 40 u daily 15 mL 3   lenalidomide  (REVLIMID ) 15 MG capsule Take 1 capsule (15 mg total) by mouth daily. Take 1 capsule (15 mg total) by mouth daily for 21 days and then take 7 days  off. 21 capsule 0   levothyroxine  (SYNTHROID ) 25 MCG tablet TAKE ONE TABLET BY MOUTH EVERY DAY BEFORE BREAKFAST 90 tablet 0   Multiple Vitamins-Minerals (PRESERVISION AREDS 2) CAPS Take 1 capsule by mouth 2 (two) times daily with a meal.     ondansetron  (ZOFRAN ) 8 MG tablet Take 8 mg by mouth 30 to 60 min prior to Cyclophosphamide administration then take 8 mg every 8 hrs as needed for nausea and vomiting. 30 tablet 1   pantoprazole  (PROTONIX ) 40 MG tablet Pantoprazole  40 mg before breakfast and dinner for 90 days with 3 refills (Patient taking differently: Take 40 mg by mouth See admin instructions. Take 40 mg by mouth 30 minutes before lunch and supper) 180 tablet 3  prochlorperazine  (COMPAZINE ) 10 MG tablet Take 1 tablet (10 mg total) by mouth every 6 (six) hours as needed for nausea or vomiting. 30 tablet 1   RYBELSUS  7 MG TABS TAKE ONE TABLET BY MOUTH EVERY DAY - (TAKE WITH NO MORE THAN 4 OUNCES OF PLAIN WATER AT LEAST 30 MINUTES BEFORE THE FIRST FOOD, BEVERAGE, OR OTHER MEDICATIONS OF THE DAY) 90 tablet 0   SYSTANE ULTRA PF 0.4-0.3 % SOLN Place 1 drop into both eyes See admin instructions. Instill 1 drop into both eyes one to four times a day     DROPLET PEN NEEDLES 31G X 5 MM MISC USE ONE NEEDLE SUBCUTANEOUSLY AS DIRECTED USE WITH NOVOLOG  (REMOVE AND DISCARD NEEDLE IN SHARPS CONTAINER IMMEDIATELY AFTER USE) 100 each 2   [Paused] hydrALAZINE  (APRESOLINE ) 25 MG tablet Take 25 mg by mouth See admin instructions. Take 25 mg by mouth at 8 AM, 3 PM, and 11 PM- in conjunction with one 50 mg tablet to equal a total dose of 75 mg (Patient not taking: Reported on 12/07/2024)     Lancets (ONETOUCH DELICA PLUS LANCET33G) MISC USE 1 LANCET TO TEST AS DIRECTED (DISCARD LANCET IN SHARPS CONTAINER IMMEDIATELY AFTER USE) 200 each 3   No current facility-administered medications for this visit.    PHYSICAL EXAMINATION: ECOG PERFORMANCE STATUS: {CHL ONC ECOG ED:[id number]} VITALS: Vitals:   01/04/25 1129  01/04/25 1140  BP: (!) 149/54 (!) 147/59  Pulse: (!) 59   Resp: 18   Temp: (!) 97.2 F (36.2 C)   SpO2: 99%    Filed Weights   01/04/25 1129  Weight: 128 lb 8 oz (58.3 kg)   Body mass index is 24.28 kg/m.  GENERAL: alert, in no acute distress and comfortable SKIN: no acute rashes, no significant lesions EYES: conjunctiva are pink and non-injected, sclera anicteric OROPHARYNX: MMM, no exudates, no oropharyngeal erythema or ulceration NECK: supple, no JVD LYMPH:  no palpable lymphadenopathy in the cervical, axillary or inguinal regions LUNGS: clear to auscultation b/l with normal respiratory effort HEART: regular rate & rhythm ABDOMEN:  normoactive bowel sounds , non tender, not distended, no hepatosplenomegaly Extremity: no pedal edema PSYCH: alert & oriented x 3 with fluent speech NEURO: no focal motor/sensory deficits  LABORATORY DATA:   I have reviewed the data as listed     Latest Ref Rng & Units 01/04/2025   10:34 AM 12/27/2024    9:30 AM 12/21/2024    8:26 AM  CBC EXTENDED  WBC 4.0 - 10.5 K/uL 5.0  5.7  4.6   RBC 3.87 - 5.11 MIL/uL 3.91  4.02  3.61   Hemoglobin 12.0 - 15.0 g/dL 88.0  87.5  88.9   HCT 36.0 - 46.0 % 35.1  36.4  32.2   Platelets 150 - 400 K/uL 189  163  122   NEUT# 1.7 - 7.7 K/uL 3.3  3.5  2.8   Lymph# 0.7 - 4.0 K/uL 0.8  0.8  0.6       Latest Ref Rng & Units 01/04/2025   10:34 AM 12/07/2024   10:00 AM 11/09/2024   10:07 AM  CMP  Glucose 70 - 99 mg/dL 830  892  845   BUN 8 - 23 mg/dL 38  38  44   Creatinine 0.44 - 1.00 mg/dL 8.64  8.69  8.67   Sodium 135 - 145 mmol/L 140  139  138   Potassium 3.5 - 5.1 mmol/L 4.4  3.8  3.6   Chloride 98 - 111  mmol/L 103  105  103   CO2 22 - 32 mmol/L 25  23  24    Calcium  8.9 - 10.3 mg/dL 9.4  9.3  8.9   Total Protein 6.5 - 8.1 g/dL 6.1  6.3  5.9   Total Bilirubin 0.0 - 1.2 mg/dL 0.3  0.3  0.3   Alkaline Phos 38 - 126 U/L 68  72  65   AST 15 - 41 U/L 20  20  20    ALT 0 - 44 U/L 16  18  18     MULTIPLE  MYELOMA & KAPPA/LAMBDA LIGHT CHAINS  Component     Latest Ref Rng 12/07/2024  IgG (Immunoglobin G), Serum     586 - 1,602 mg/dL 584 (L)   IgA     64 - 422 mg/dL 9 (L)   IgM (Immunoglobulin M), Srm     26 - 217 mg/dL 17 (L)   Total Protein ELP     6.0 - 8.5 g/dL 5.8 (L) (C)  Albumin SerPl Elph-Mcnc     2.9 - 4.4 g/dL 3.7 (C)  Alpha 1     0.0 - 0.4 g/dL 0.2 (C)  Alpha2 Glob SerPl Elph-Mcnc     0.4 - 1.0 g/dL 0.8 (C)  B-Globulin SerPl Elph-Mcnc     0.7 - 1.3 g/dL 0.8 (C)  Gamma Glob SerPl Elph-Mcnc     0.4 - 1.8 g/dL 0.3 (L) (C)  M Protein SerPl Elph-Mcnc     Not Observed g/dL 0.1 (H) (C)  Globulin, Total     2.2 - 3.9 g/dL 2.1 (L) (C)  Albumin/Glob SerPl     0.7 - 1.7  1.8 (H) (C)  IFE 1 Immunofixation shows IgG monoclonal protein with lambda light chain  specificity.   Samples from patients receiving DARZALEX (R) (daratumumab ) or  SARCLISA(R)(isatuximab-irfc) treatment can appear as an  IgG kappa and mask a complete response (CR).   Kappa free light chain     3.3 - 19.4 mg/L 5.1   Lambda free light chains     5.7 - 26.3 mg/L 6.0   Kappa, lambda light chain ratio     0.26 - 1.65  0.85     PREVIOUS STUDIES: 12/27/2024 Surgical Pathology CASE: [id number] PATIENT: Aracelie Toman Bone Marrow Report  Clinical History: Myeloma, restaging   DIAGNOSIS:  BONE MARROW, ASPIRATE, CLOT, CORE: - Mildly hypocellular bone marrow (average 15 to 20%) with orderly trilineage hematopoiesis. -  No morphologic or immunohistochemical evidence of the patient's known plasma cell myeloma  PERIPHERAL BLOOD: - Unremarkable morphologic smear review  MICROSCOPIC DESCRIPTION:  PERIPHERAL BLOOD SMEAR: The peripheral blood smear and indices are reviewed revealing no significant abnormality including lack of rouleaux and circulating plasma cells.  BONE MARROW ASPIRATE: The bone marrow aspirate smear slides contain several cellular bone marrow spicules which are adequate  for interpretation. Erythroid precursors: Erythroid series is present in adequate number with an overall relative hyperplasia but with otherwise orderly maturation and unremarkable morphology. Granulocytic precursors:  The myeloid series is present in adequate number but with a relative hypoplasia but otherwise orderly maturation and unremarkable morphology. Megakaryocytes: The megakaryocytes are present in adequate to slightly increased number with otherwise unremarkable morphology. Lymphocytes/plasma cells: Plasma cells and lymphocytes are not increased.  TOUCH PREPARATIONS: The touch prep shows similar but suboptimal morphology compared to the aspirate smear slides, refer to above  CLOT AND BIOPSY: The decalcified bone marrow biopsy consists of heavily fragmented cores of predominantly cortical type bone with a limited amount  of hematopoietic marrow present in a subcortical location.  The overall cellularity is approximately 15% which is mildly hypocellular for age but within a subcortical location.  There is otherwise orderly trilineage hematopoiesis.  Plasma cells are not readily identified on the HE-stained slide.  The clot section shows scattered fragments of hematopoietic marrow with a cellularity ranging from 5 to focally 50%.  The overall cellularity is approximately 20% overall.  The background marrow shows orderly trilineage hematopoiesis.  The focal area of increased cellularity is predominantly secondary to a well-circumscribed lymphoid aggregate but with adjacent myeloid hyperplasia with slight left shift of uncertain clinical significance.  IMMUNOHISTOCHEMICAL STAINS: Immunohistochemical stains are performed on both the biopsy and clot section with appropriate controls.  CD138/MUM1 highlight rare scattered plasma cells (approximately 1 to 2% is difficult to ascertain best seen on the clot section kappa lambda stain highlights polyclonal plasma cells.  CD20 and  CD3 were performed given the presence of the lymphoid aggregate; however, the lymphoid aggregate is lost on levels and CD20 and CD3 highlight scattered small T cells and B cells which are otherwise unremarkable.  IRON STAIN: Iron stains are performed on a bone marrow aspirate or touch imprint smear and section of clot. The controls stained appropriately.       Storage Iron: Near absent histiocytic iron stores      Ring Sideroblasts: Not identified  ADDITIONAL DATA/TESTING: Conventional cytogenetics and plasma cell myeloma prognostic panel FISH are pending and will be reported separately.  CELL COUNT DATA:  Bone Marrow count performed on 500 cells shows: Blasts:   0%   Myeloid:  39% Promyelocytes: 2%   Erythroid:     53% Myelocytes:    16%  Lymphocytes:   5% Metamyelocytes:     0%   Plasma cells:  1% Bands:    0% Neutrophils:   21%  M:E ratio:     0.74 Eosinophils:   0% Basophils:     0% Monocytes:     2%  Lab Data: CBC performed on 12/27/2024 shows: WBC: 5.7 k/uL  Neutrophils:   70% Hgb: 12.4 g/dL Lymphocytes:   85% HCT: 36.4 %    Monocytes:     14% MCV: 90.5 fL   Eosinophils:   2% RDW: 14.1 %    Basophils:     0% PLT: 163 k/uL    RADIOGRAPHIC STUDIES: I have personally reviewed the radiological images as listed and agreed with the findings in the report. CT BONE MARROW BIOPSY & ASPIRATION Result Date: 12/27/2024 INDICATION: History of multiple myeloma. Prior bone marrow biopsy 02/11/2024. Unilateral bone marrow aspiration and biopsy for evaluation of response to treatment EXAM: CT GUIDED BONE MARROW ASPIRATION AND CORE BIOPSY MEDICATIONS: None. ANESTHESIA/SEDATION: Moderate (conscious) sedation was employed during this procedure. A total of Versed  2 mg and Fentanyl  100 mcg was administered intravenously. Moderate Sedation Time: 10 minutes. The patient's level of consciousness and vital signs were monitored continuously by radiology nursing throughout the procedure under my  direct supervision. FLUOROSCOPY TIME:  CT dose; 125 mGycm COMPLICATIONS: None immediate. Estimated blood loss: <5 mL PROCEDURE: RADIATION DOSE REDUCTION: This exam was performed according to the departmental dose-optimization program which includes automated exposure control, adjustment of the mA and/or kV according to patient size and/or use of iterative reconstruction technique. Informed written consent was obtained from the patient after a thorough discussion of the procedural risks, benefits and alternatives. All questions were addressed. Maximal Sterile Barrier Technique was utilized including caps, mask, sterile gowns, sterile gloves,  sterile drape, hand hygiene and skin antiseptic. A timeout was performed prior to the initiation of the procedure. The patient was positioned prone and non-contrast localization CT was performed of the pelvis to demonstrate the iliac marrow spaces. Under sterile conditions and local anesthesia, an 11 gauge coaxial bone biopsy needle was advanced into the RIGHT iliac marrow space. Needle position was confirmed with CT imaging. Initially, bone marrow aspiration was performed. Next, the 11 gauge outer cannula was utilized to obtain a single iliac bone marrow core biopsy. Needle was removed. Hemostasis was obtained with compression. The patient tolerated the procedure well. Samples were prepared with the cytotechnologist. IMPRESSION: Successful CT-guided bone marrow aspiration and biopsy. Thom Hall, MD [facility] Electronically Signed   By: Thom Hall M.D.   On: 12/27/2024 18:07   MM 3D SCREENING MAMMOGRAM BILATERAL BREAST Result Date: 11/01/2024 CLINICAL DATA:  Screening. EXAM: DIGITAL SCREENING BILATERAL MAMMOGRAM WITH TOMOSYNTHESIS AND CAD TECHNIQUE: Bilateral screening digital craniocaudal and mediolateral oblique mammograms were obtained. Bilateral screening digital breast tomosynthesis was performed. The images  were evaluated with computer-aided detection. COMPARISON:  Previous exam(s). ACR Breast Density Category b: There are scattered areas of fibroglandular density. FINDINGS: There are no findings suspicious for malignancy. IMPRESSION: No mammographic evidence of malignancy. A result letter of this screening mammogram will be mailed directly to the patient. RECOMMENDATION: Screening mammogram in one year. (Code:SM-B-01Y) BI-RADS CATEGORY  1: Negative. Electronically Signed   By: Craig Farr M.D.   On: 11/01/2024 08:06    ASSESSMENT & PLAN:  79 y.o. female with  Multiple myeloma - Lambda restricted plasma cell neoplasm comprising greater than 75% of  the cellular marrow  Molecular Cyto t(11;14) PET/CT with no bone lesions   2. Acute on CKD - resolved to baseline   3. HTN   4. DM2   5. Hypothyroidism  PLAN: - Discussed available lab results on 01/04/2025 in detail with patient: CBC showed WBC of 5K, Hemoglobin of 11.9 decreased from 12.4, and PLTs of 189K CMP with Creatinine 1.35 increased from 1.30 and BUN 38 has not changed.  M protein 0.1 and IFE *** Kappa/Lambda Lights Chains stable  - She is officially in remission   - This is not curative, however, she has significantly improved from her initial presentation. We previously discussed a tranplant and she did not wish to proceed with that option.  Maintenance treatments discussed. I'm thinking of single agent maintenance with Revlimid . She is currently on 15 mg, and tolerating this without notable toxicities or side effects. Maintenance treatment can be ongoing for several years.  After completing tx, then we have the option of discontinuing maintenance treatment or decreasing dose.  Reviewed risks: increased risk of infection, bone marrow suppression, increased risk of blood clots (is on ASA), some studies showed increased risk of secondary cancers (usually with previous risk factors as well).   - Discussed Minimal Residual Disease  status.  - Reviewed infection precautions for crowds, family, and friends.  - Provided health maintenance counseling: recommended staying updated with age-recommended vaccinations and cancer screenings, including following with Dermatology for skin evaluations.   - Discussed that she would be have to be off of ASA around the time of any future procedures that may risk blood loss.   - Discussed guidelines regarding Revlimid  in association with potential surgical procedures she may undergo.   - Recommend following up with dentist to discuss if she'll need any invasive dental procedures in the near future.   -  Decreased Revlimid  to 10 mg at next cycle.  - Finish Dara today  - Cont Revlimid  15 mg - Continue to monitor labs.  FOLLOW-UP Plz cancel all treatment appointment for 01/18/2025 (going to maintenance treatment) RTC with Dr Onesimo in 2 months. Labs 1 week prior to labs visit  The total time spent in the appointment was *** minutes* .  All of the patient's questions were answered and the patient knows to call the clinic with any problems, questions, or concerns.  Emaline Onesimo MD MS AAHIVMS [facility] Hematology/Oncology Physician [facility]  *Total Encounter Time as defined by the Centers for Medicare and Medicaid Services includes, in addition to the face-to-face time of a patient visit (documented in the note above) non-face-to-face time: obtaining and reviewing outside history, ordering and reviewing medications, tests or procedures, care coordination (communications with other health care professionals or caregivers) and documentation in the medical record.  I,Emily Lagle,acting as a neurosurgeon for Emaline Onesimo, MD.,have documented all relevant documentation on the behalf of Emaline Onesimo, MD,as directed by  Emaline Onesimo, MD while in the presence of Emaline Onesimo, MD.  I have reviewed the above documentation for accuracy and completeness, and I agree with the above.  Emaline Onesimo,  MD     [1]  Social History Tobacco Use   Smoking status: Never   Smokeless tobacco: Never  Vaping Use   Vaping status: Never Used  Substance Use Topics   Alcohol use: No    Alcohol/week: 0.0 standard drinks of alcohol   Drug use: No   "

## 2025-01-10 NOTE — Telephone Encounter (Signed)
Please schedule office visit to discuss?  Thank you

## 2025-01-11 ENCOUNTER — Encounter: Payer: Self-pay | Admitting: Hematology

## 2025-01-12 NOTE — Telephone Encounter (Signed)
 Called and spoke with patient and patient's husband (at length) - patient advised of MD recommendations and has been scheduled for an OV with Dr. Nandigam;  Dr.Nandigam-informational only

## 2025-01-13 ENCOUNTER — Other Ambulatory Visit: Payer: Self-pay

## 2025-01-13 MED ORDER — LENALIDOMIDE 10 MG PO CAPS: 10.0000 mg | ORAL_CAPSULE | Freq: Every day | ORAL | 0 refills | Status: AC

## 2025-01-17 ENCOUNTER — Other Ambulatory Visit: Payer: Self-pay | Admitting: Family Medicine

## 2025-01-17 ENCOUNTER — Ambulatory Visit: Admitting: Cardiovascular Disease

## 2025-01-17 DIAGNOSIS — E039 Hypothyroidism, unspecified: Secondary | ICD-10-CM

## 2025-01-18 ENCOUNTER — Inpatient Hospital Stay

## 2025-01-18 ENCOUNTER — Other Ambulatory Visit: Payer: Self-pay | Admitting: Gastroenterology

## 2025-02-27 ENCOUNTER — Inpatient Hospital Stay: Attending: Hematology

## 2025-03-06 ENCOUNTER — Inpatient Hospital Stay: Admitting: Hematology

## 2025-03-07 ENCOUNTER — Inpatient Hospital Stay: Admitting: Hematology

## 2025-03-10 ENCOUNTER — Ambulatory Visit: Admitting: Gastroenterology

## 2025-04-04 ENCOUNTER — Ambulatory Visit: Admitting: Cardiovascular Disease
